# Patient Record
Sex: Male | Born: 1955 | Race: Black or African American | Hispanic: No | Marital: Married | Smoking: Never smoker
Health system: Southern US, Community
[De-identification: ages and names within clinical notes are randomized; demographics above are authoritative.]

## PROBLEM LIST (undated history)

## (undated) DIAGNOSIS — J301 Allergic rhinitis due to pollen: Secondary | ICD-10-CM

## (undated) DIAGNOSIS — M545 Low back pain, unspecified: Secondary | ICD-10-CM

## (undated) DIAGNOSIS — M5431 Sciatica, right side: Secondary | ICD-10-CM

## (undated) DIAGNOSIS — K649 Unspecified hemorrhoids: Secondary | ICD-10-CM

## (undated) DIAGNOSIS — E119 Type 2 diabetes mellitus without complications: Secondary | ICD-10-CM

## (undated) DIAGNOSIS — M48061 Spinal stenosis, lumbar region without neurogenic claudication: Secondary | ICD-10-CM

## (undated) DIAGNOSIS — M722 Plantar fascial fibromatosis: Secondary | ICD-10-CM

## (undated) DIAGNOSIS — M961 Postlaminectomy syndrome, not elsewhere classified: Secondary | ICD-10-CM

## (undated) DIAGNOSIS — E7800 Pure hypercholesterolemia, unspecified: Secondary | ICD-10-CM

## (undated) DIAGNOSIS — G894 Chronic pain syndrome: Secondary | ICD-10-CM

## (undated) DIAGNOSIS — M5136 Other intervertebral disc degeneration, lumbar region: Secondary | ICD-10-CM

## (undated) DIAGNOSIS — M51369 Other intervertebral disc degeneration, lumbar region without mention of lumbar back pain or lower extremity pain: Secondary | ICD-10-CM

## (undated) DIAGNOSIS — F1190 Opioid use, unspecified, uncomplicated: Secondary | ICD-10-CM

## (undated) DIAGNOSIS — K602 Anal fissure, unspecified: Secondary | ICD-10-CM

## (undated) DIAGNOSIS — E559 Vitamin D deficiency, unspecified: Secondary | ICD-10-CM

## (undated) DIAGNOSIS — C61 Malignant neoplasm of prostate: Secondary | ICD-10-CM

## (undated) DIAGNOSIS — I10 Essential (primary) hypertension: Secondary | ICD-10-CM

## (undated) HISTORY — DX: Plantar fascial fibromatosis: M72.2

## (undated) HISTORY — DX: Other intervertebral disc degeneration, lumbar region without mention of lumbar back pain or lower extremity pain: M51.369

## (undated) HISTORY — DX: Allergic rhinitis due to pollen: J30.1

## (undated) HISTORY — DX: Low back pain, unspecified: M54.50

## (undated) HISTORY — PX: PROSTATECTOMY: SHX69

## (undated) HISTORY — DX: Pure hypercholesterolemia, unspecified: E78.00

## (undated) HISTORY — DX: Essential (primary) hypertension: I10

## (undated) HISTORY — DX: Anal fissure, unspecified: K60.2

## (undated) HISTORY — DX: Type 2 diabetes mellitus without complications: E11.9

## (undated) HISTORY — DX: Unspecified hemorrhoids: K64.9

## (undated) HISTORY — DX: Other intervertebral disc degeneration, lumbar region: M51.36

## (undated) HISTORY — DX: Vitamin D deficiency, unspecified: E55.9

## (undated) HISTORY — PX: COLONOSCOPY: SHX174

## (undated) HISTORY — DX: Malignant neoplasm of prostate: C61

## (undated) HISTORY — DX: Low back pain: M54.5

---
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
## 1997-07-30 ENCOUNTER — Emergency Department (HOSPITAL_COMMUNITY): Admission: EM | Admit: 1997-07-30 | Discharge: 1997-07-30 | Payer: Self-pay | Admitting: Internal Medicine

## 1997-08-04 ENCOUNTER — Encounter: Admission: RE | Admit: 1997-08-04 | Discharge: 1997-08-04 | Payer: Self-pay | Admitting: *Deleted

## 1997-08-07 ENCOUNTER — Encounter: Admission: RE | Admit: 1997-08-07 | Discharge: 1997-11-05 | Payer: Self-pay

## 1997-08-08 ENCOUNTER — Encounter: Admission: RE | Admit: 1997-08-08 | Discharge: 1997-08-08 | Payer: Self-pay | Admitting: *Deleted

## 1997-10-28 ENCOUNTER — Emergency Department (HOSPITAL_COMMUNITY): Admission: EM | Admit: 1997-10-28 | Discharge: 1997-10-28 | Payer: Self-pay | Admitting: Emergency Medicine

## 1998-12-16 ENCOUNTER — Encounter: Payer: Self-pay | Admitting: Internal Medicine

## 1998-12-16 ENCOUNTER — Emergency Department (HOSPITAL_COMMUNITY): Admission: EM | Admit: 1998-12-16 | Discharge: 1998-12-16 | Payer: Self-pay | Admitting: Emergency Medicine

## 1999-06-17 ENCOUNTER — Encounter: Payer: Self-pay | Admitting: Emergency Medicine

## 1999-06-17 ENCOUNTER — Emergency Department (HOSPITAL_COMMUNITY): Admission: EM | Admit: 1999-06-17 | Discharge: 1999-06-17 | Payer: Self-pay | Admitting: Emergency Medicine

## 2002-08-07 ENCOUNTER — Encounter: Payer: Self-pay | Admitting: Emergency Medicine

## 2002-08-07 ENCOUNTER — Emergency Department (HOSPITAL_COMMUNITY): Admission: EM | Admit: 2002-08-07 | Discharge: 2002-08-07 | Payer: Self-pay | Admitting: Emergency Medicine

## 2002-09-02 ENCOUNTER — Encounter: Admission: RE | Admit: 2002-09-02 | Discharge: 2002-11-07 | Payer: Self-pay | Admitting: Orthopaedic Surgery

## 2002-10-08 ENCOUNTER — Encounter: Admission: RE | Admit: 2002-10-08 | Discharge: 2002-11-26 | Payer: Self-pay | Admitting: Family Medicine

## 2002-10-22 ENCOUNTER — Emergency Department (HOSPITAL_COMMUNITY): Admission: AD | Admit: 2002-10-22 | Discharge: 2002-10-22 | Payer: Self-pay | Admitting: Family Medicine

## 2002-11-09 ENCOUNTER — Ambulatory Visit (HOSPITAL_COMMUNITY): Admission: RE | Admit: 2002-11-09 | Discharge: 2002-11-09 | Payer: Self-pay | Admitting: Family Medicine

## 2003-07-13 ENCOUNTER — Emergency Department (HOSPITAL_COMMUNITY): Admission: EM | Admit: 2003-07-13 | Discharge: 2003-07-13 | Payer: Self-pay | Admitting: Emergency Medicine

## 2005-02-03 ENCOUNTER — Encounter: Admission: RE | Admit: 2005-02-03 | Discharge: 2005-02-03 | Payer: Self-pay | Admitting: Orthopaedic Surgery

## 2006-09-14 ENCOUNTER — Encounter: Admission: RE | Admit: 2006-09-14 | Discharge: 2006-09-14 | Payer: Self-pay | Admitting: Internal Medicine

## 2007-10-01 ENCOUNTER — Emergency Department (HOSPITAL_COMMUNITY): Admission: EM | Admit: 2007-10-01 | Discharge: 2007-10-01 | Payer: Self-pay | Admitting: Family Medicine

## 2008-01-11 HISTORY — PX: CATARACT EXTRACTION: SUR2

## 2009-01-10 HISTORY — PX: LUMBAR DISC SURGERY: SHX700

## 2009-07-30 ENCOUNTER — Emergency Department (HOSPITAL_COMMUNITY): Admission: EM | Admit: 2009-07-30 | Discharge: 2009-07-30 | Payer: Self-pay | Admitting: Emergency Medicine

## 2009-08-13 ENCOUNTER — Encounter (HOSPITAL_COMMUNITY): Admission: RE | Admit: 2009-08-13 | Discharge: 2009-10-05 | Payer: Self-pay | Admitting: Cardiology

## 2009-12-21 ENCOUNTER — Encounter: Admission: RE | Admit: 2009-12-21 | Payer: Self-pay | Source: Home / Self Care | Admitting: Orthopedic Surgery

## 2009-12-24 ENCOUNTER — Encounter
Admission: RE | Admit: 2009-12-24 | Discharge: 2009-12-24 | Payer: Self-pay | Source: Home / Self Care | Attending: Orthopaedic Surgery | Admitting: Orthopaedic Surgery

## 2010-01-30 ENCOUNTER — Encounter: Payer: Self-pay | Admitting: Orthopaedic Surgery

## 2010-01-31 ENCOUNTER — Encounter: Payer: Self-pay | Admitting: Orthopaedic Surgery

## 2010-01-31 ENCOUNTER — Encounter: Payer: Self-pay | Admitting: Orthopedic Surgery

## 2010-02-01 ENCOUNTER — Encounter: Payer: Self-pay | Admitting: Cardiology

## 2010-03-27 LAB — POCT I-STAT, CHEM 8
BUN: 18 mg/dL (ref 6–23)
Chloride: 104 mEq/L (ref 96–112)
Creatinine, Ser: 1.5 mg/dL (ref 0.4–1.5)
Glucose, Bld: 96 mg/dL (ref 70–99)
Potassium: 3.7 mEq/L (ref 3.5–5.1)

## 2010-03-27 LAB — DIFFERENTIAL
Basophils Absolute: 0 10*3/uL (ref 0.0–0.1)
Lymphocytes Relative: 35 % (ref 12–46)
Lymphs Abs: 1.2 10*3/uL (ref 0.7–4.0)
Monocytes Relative: 11 % (ref 3–12)

## 2010-03-27 LAB — CBC
MCH: 29.1 pg (ref 26.0–34.0)
MCHC: 34.6 g/dL (ref 30.0–36.0)
MCV: 84.3 fL (ref 78.0–100.0)
Platelets: 148 10*3/uL — ABNORMAL LOW (ref 150–400)
RBC: 4.75 MIL/uL (ref 4.22–5.81)
RDW: 15.6 % — ABNORMAL HIGH (ref 11.5–15.5)
WBC: 3.4 10*3/uL — ABNORMAL LOW (ref 4.0–10.5)

## 2010-03-27 LAB — POCT CARDIAC MARKERS: Myoglobin, poc: 75 ng/mL (ref 12–200)

## 2010-04-23 ENCOUNTER — Encounter: Payer: Self-pay | Admitting: Internal Medicine

## 2010-05-28 NOTE — Consult Note (Signed)
[facility]  Patient:    Cameron Cardenas                      MRN: [id number] Proc. Date: 12/16/98 Adm. Date:  [id number] Attending:  Lorre Nick                          Consultation Report  CHIEF COMPLAINT:  Acute low back pain and weakness in the legs.  HISTORY OF PRESENT ILLNESS:  The patient is a 55 year old black male who was doing well until today.  He noted, while standing, that he developed severe pain in his lower back traveling down both legs.  He states he had significant pain to the point of being unable to walk.  No dysesthesias appreciated in his legs. Symptoms continued and he contacted the office and was referred to the emergency room for evaluation.  The patient denies recent strenuous activity.  This is as far back as the past two weeks.  He has not had similar symptoms most recently.  Distant history is notable for him having a herniated disk with laminectomy.  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  He denies problems with voiding.  No postvoid dribbling or urgency as well.  REVIEW OF SYSTEMS:  As noted above.  The patient does not smoke or drink.  He has had some sexual dysfunction in the past.  He has tried Viagra in the past, which caused headaches.  No similar medication used recently.  PHYSICAL EXAMINATION:  GENERAL:  He is a well-developed, well-nourished black male in moderate distress initially.  VITAL SIGNS:  Blood pressure 130/82, pulse 90, respirations 18.  LUNGS:  Clear without wheezes or rales.  He had bilateral paraspinal muscle prominence.  Minimal tenderness in the lower back to percussion.  ABDOMEN:  Soft without significant tenderness.  MUSCULOSKELETAL:  As noted above.  He had bilaterally positive straight leg raise. Strength, however, was intact.  Sensation was intact.  LABORATORY  DATA:  X-ray of the LS spine showed mild disk space narrowing at L5-S1. MRI scan of his back showed mild bulge at L3-4, mild ______ protrusion L4-5 with a paracentral protrusion versus scar at L5-S1.  No evidence for herniated disks appreciated.  IMPRESSION: 1. Acute low back strain. 2. Degenerative disk disease.  PLAN:  The patient was given a dose of Toradol 30 mg IM with a good response. e will continue him on Celebrex 200 mg p.o. b.i.d. with Flexeril 10 mg p.o. q.h.s. for the next two weeks.  The patient is to avoid prolonged sitting, especially hen driving.  He is to take breaks at every hour to hour and a half.  He has been given low back exercises to begin performing once his pain is reduced.  He will be seen in the office in two weeks time for follow-up. DD:  12/16/98 TD:  12/17/98 Job: [id number]

## 2010-12-29 ENCOUNTER — Other Ambulatory Visit: Payer: Self-pay | Admitting: Internal Medicine

## 2010-12-29 DIAGNOSIS — R109 Unspecified abdominal pain: Secondary | ICD-10-CM

## 2010-12-30 ENCOUNTER — Ambulatory Visit
Admission: RE | Admit: 2010-12-30 | Discharge: 2010-12-30 | Disposition: A | Discharge status: Home / Self Care | Payer: BC Managed Care – PPO | Source: Ambulatory Visit | Attending: Internal Medicine | Admitting: Internal Medicine

## 2010-12-30 DIAGNOSIS — R109 Unspecified abdominal pain: Secondary | ICD-10-CM

## 2011-01-07 ENCOUNTER — Other Ambulatory Visit: Payer: Self-pay | Admitting: Cardiology

## 2011-01-07 ENCOUNTER — Ambulatory Visit
Admission: RE | Admit: 2011-01-07 | Discharge: 2011-01-07 | Disposition: A | Discharge status: Home / Self Care | Payer: BC Managed Care – PPO | Source: Ambulatory Visit | Attending: Cardiology | Admitting: Cardiology

## 2011-01-07 DIAGNOSIS — R0781 Pleurodynia: Secondary | ICD-10-CM

## 2011-01-07 DIAGNOSIS — R079 Chest pain, unspecified: Secondary | ICD-10-CM

## 2011-12-28 ENCOUNTER — Encounter: Payer: Self-pay | Admitting: Internal Medicine

## 2012-01-03 ENCOUNTER — Ambulatory Visit (AMBULATORY_SURGERY_CENTER): Payer: BC Managed Care – PPO | Admitting: *Deleted

## 2012-01-03 ENCOUNTER — Encounter: Payer: Self-pay | Admitting: Internal Medicine

## 2012-01-03 VITALS — Ht 76.0 in | Wt 215.8 lb

## 2012-01-03 DIAGNOSIS — Z1211 Encounter for screening for malignant neoplasm of colon: Secondary | ICD-10-CM

## 2012-01-03 MED ORDER — NA SULFATE-K SULFATE-MG SULF 17.5-3.13-1.6 GM/177ML PO SOLN: 1.0000 | Freq: Once | ORAL | Status: DC

## 2012-01-13 ENCOUNTER — Ambulatory Visit (AMBULATORY_SURGERY_CENTER): Payer: BC Managed Care – PPO | Admitting: Internal Medicine

## 2012-01-13 ENCOUNTER — Encounter: Payer: Self-pay | Admitting: Internal Medicine

## 2012-01-13 VITALS — BP 115/76 | HR 62 | Temp 98.3°F | Resp 13 | Ht 76.0 in | Wt 215.0 lb

## 2012-01-13 DIAGNOSIS — Z1211 Encounter for screening for malignant neoplasm of colon: Secondary | ICD-10-CM

## 2012-01-13 DIAGNOSIS — Z86010 Personal history of colonic polyps: Secondary | ICD-10-CM

## 2012-01-13 MED ORDER — SODIUM CHLORIDE 0.9 % IV SOLN: 500.0000 mL | INTRAVENOUS | Status: DC

## 2012-01-13 NOTE — Op Note (Signed)
[facility] [address] [place], [address]   COLONOSCOPY PROCEDURE REPORT  PATIENT: Cameron Cardenas, Cameron Cardenas  MR#: [id number] BIRTHDATE: 29-Apr-1955 , 56  yrs. old GENDER: Male ENDOSCOPIST: Iva Boop, MD, [facility] REFERRED XB:JYNWGN Donette Larry, M.D. PROCEDURE DATE:  01/13/2012 PROCEDURE:   Colonoscopy, diagnostic ASA CLASS:   Class III INDICATIONS:Screening and surveillance,personal history of colonic polyps. MEDICATIONS: propofol (Diprivan) 250mg  IV, MAC sedation, administered by CRNA, and These medications were titrated to patient response per physician's verbal order  DESCRIPTION OF PROCEDURE:   After the risks benefits and alternatives of the procedure were thoroughly explained, informed consent was obtained.  A digital rectal exam revealed no abnormalities of the rectum and A digital rectal exam revealed the prostate was not enlarged.   The LB CF-[id number]  endoscope was introduced through the anus and advanced to the cecum, which was identified by both the appendix and ileocecal valve. No adverse events experienced.   The quality of the prep was Suprep excellent The instrument was then slowly withdrawn as the colon was fully examined.      COLON FINDINGS: A normal appearing cecum, ileocecal valve, and appendiceal orifice were identified.  The ascending, hepatic flexure, transverse, splenic flexure, descending, sigmoid colon and rectum appeared unremarkable.  No polyps or cancers were seen. Retroflexed views revealed no abnormalities. The time to cecum=1 minutes 25 seconds.  Withdrawal time=14 minutes 49 seconds.  The scope was withdrawn and the procedure completed. COMPLICATIONS: There were no complications.  ENDOSCOPIC IMPRESSION: Normal colonoscopy in this patient with reported history of prior polyps  RECOMMENDATIONS: Repeat Colonoscopy in 5 years. if no polyps revert to routine every 10 year colonoscopy   eSigned:  Iva Boop, MD, [facility]  01/13/2012 4:05 PM   cc: Georgann Housekeeper MD and The Patient

## 2012-01-13 NOTE — Patient Instructions (Addendum)
No polyps were seen. Your colonoscopy was normal.  Since you report a history of polyps on your last colonoscopy I recommend a routine repeat colonoscopy in 5 years.  Thank you for choosing me and [facility].  Iva Boop, MD, FACG  YOU HAD AN ENDOSCOPIC PROCEDURE TODAY AT THE [facility]: Refer to the procedure report that was given to you for any specific questions about what was found during the examination.  If the procedure report does not answer your questions, please call your gastroenterologist to clarify.  If you requested that your care partner not be given the details of your procedure findings, then the procedure report has been included in a sealed envelope for you to review at your convenience later.  YOU SHOULD EXPECT: Some feelings of bloating in the abdomen. Passage of more gas than usual.  Walking can help get rid of the air that was put into your GI tract during the procedure and reduce the bloating. If you had a lower endoscopy (such as a colonoscopy or flexible sigmoidoscopy) you may notice spotting of blood in your stool or on the toilet paper. If you underwent a bowel prep for your procedure, then you may not have a normal bowel movement for a few days.  DIET: Your first meal following the procedure should be a light meal and then it is ok to progress to your normal diet.  A half-sandwich or bowl of soup is an example of a good first meal.  Heavy or fried foods are harder to digest and may make you feel nauseous or bloated.  Likewise meals heavy in dairy and vegetables can cause extra gas to form and this can also increase the bloating.  Drink plenty of fluids but you should avoid alcoholic beverages for 24 hours.  ACTIVITY: Your care partner should take you home directly after the procedure.  You should plan to take it easy, moving slowly for the rest of the day.  You can resume normal activity the day after the procedure however you should NOT  DRIVE or use heavy machinery for 24 hours (because of the sedation medicines used during the test).    SYMPTOMS TO REPORT IMMEDIATELY: A gastroenterologist can be reached at any hour.  During normal business hours, 8:30 AM to 5:00 PM Monday through Friday, call [phone].  After hours and on weekends, please call the GI answering service at [phone] who will take a message and have the physician on call contact you.   Following lower endoscopy (colonoscopy or flexible sigmoidoscopy):  Excessive amounts of blood in the stool  Significant tenderness or worsening of abdominal pains  Swelling of the abdomen that is new, acute  Fever of 100F or higher   FOLLOW UP: If any biopsies were taken you will be contacted by phone or by letter within the next 1-3 weeks.  Call your gastroenterologist if you have not heard about the biopsies in 3 weeks.  Our staff will call the home number listed on your records the next business day following your procedure to check on you and address any questions or concerns that you may have at that time regarding the information given to you following your procedure. This is a courtesy call and so if there is no answer at the home number and we have not heard from you through the emergency physician on call, we will assume that you have returned to your regular daily activities without incident.  SIGNATURES/CONFIDENTIALITY: You and/or  your care partner have signed paperwork which will be entered into your electronic medical record.  These signatures attest to the fact that that the information above on your After Visit Summary has been reviewed and is understood.  Full responsibility of the confidentiality of this discharge information lies with you and/or your care-partner.    Normal colonoscopy.  Next colonoscopy in 5 years-2019.

## 2012-01-13 NOTE — Progress Notes (Signed)
Patient did not experience any of the following events: a burn prior to discharge; a fall within the facility; wrong site/side/patient/procedure/implant event; or a hospital transfer or hospital admission upon discharge from the facility. (G8907) Patient did not have preoperative order for IV antibiotic SSI prophylaxis. (G8918)  

## 2012-01-16 ENCOUNTER — Telehealth: Payer: Self-pay | Admitting: *Deleted

## 2012-01-16 NOTE — Telephone Encounter (Signed)
  Follow up Call-  Call back number 01/13/2012  Post procedure Call Back phone  # [phone] cell  Permission to leave phone message Yes     Left message on answering machine to give [name] a call back if he is experiencing problems or has questions

## 2012-08-28 ENCOUNTER — Encounter: Payer: Self-pay | Admitting: Endocrinology

## 2012-08-28 ENCOUNTER — Ambulatory Visit (INDEPENDENT_AMBULATORY_CARE_PROVIDER_SITE_OTHER): Payer: Managed Care, Other (non HMO) | Admitting: Endocrinology

## 2012-08-28 VITALS — BP 124/80 | HR 70 | Temp 98.2°F | Resp 12 | Ht 76.0 in | Wt 212.7 lb

## 2012-08-28 DIAGNOSIS — E785 Hyperlipidemia, unspecified: Secondary | ICD-10-CM

## 2012-08-28 DIAGNOSIS — E119 Type 2 diabetes mellitus without complications: Secondary | ICD-10-CM

## 2012-08-28 DIAGNOSIS — E118 Type 2 diabetes mellitus with unspecified complications: Secondary | ICD-10-CM | POA: Insufficient documentation

## 2012-08-28 LAB — COMPREHENSIVE METABOLIC PANEL
Alkaline Phosphatase: 40 U/L (ref 39–117)
CO2: 26 mEq/L (ref 19–32)
Creatinine, Ser: 1.1 mg/dL (ref 0.4–1.5)
GFR: 85.69 mL/min (ref >60.00)
Glucose, Bld: 100 mg/dL — ABNORMAL HIGH (ref 70–99)
Sodium: 138 mEq/L (ref 135–145)
Total Bilirubin: 1.1 mg/dL (ref 0.3–1.2)
Total Protein: 6.5 g/dL (ref 6.0–8.3)

## 2012-08-28 LAB — HEMOGLOBIN A1C: Hgb A1c MFr Bld: 5.9 % (ref 4.6–6.5)

## 2012-08-28 LAB — MICROALBUMIN / CREATININE URINE RATIO
Microalb Creat Ratio: 0.3 mg/g (ref 0.0–30.0)
Microalb, Ur: 0.5 mg/dL (ref 0.0–1.9)

## 2012-08-28 NOTE — Patient Instructions (Signed)
Please check blood sugars at least half the time about 2 hours after any meal and weekly on waking up. Please bring blood sugar monitor to each visit  Try 1/2 Liptor 1st

## 2012-08-28 NOTE — Progress Notes (Signed)
Patient ID: Cameron Cardenas, male   DOB: 12/13/1955, 57 y.o.   MRN: [id number]  Cameron Cardenas is an 57 y.o. male.   Reason for Appointment: Diabetes follow-up   History of Present Illness   Diagnosis: Type 2 DIABETES MELITUS, date of diagnosis: 2004  PAST history: He had mild diabetes at onset and was treated with metformin and subsequently Actoplusmet In 2013 he had changed his diet significantly and was exercising. This helped him with weight loss Subsequently his blood sugars have been excellent with A1c upper normal  RECENT history: His blood sugars are still looking fairly good although he thinks he gained some weight with being on vacation for 2 weeks   He likes to take his Actoplusmet in the morning as thinks he feels better with this     Oral hypoglycemic drugs: Actoplusmet and WelChol        Side effects from medications: None Monitors blood glucose:  none, this is despite his being instructed on home glucose monitoring in 12/13       Meals: 3 meals per day.          Physical activity: exercise: At least 3 days a week with exercise bike, some running and treadmill           Dietician visit: Most recent: 12/13           Complications: are: None, has normal microalbumin   Lab Results  Component Value Date   HGBA1C 5.9 08/28/2012       Wt Readings from Last 3 Encounters:  08/28/12 212 lb 11.2 oz (96.48 kg)  01/13/12 215 lb (97.523 kg)  01/03/12 215 lb 12.8 oz (97.886 kg)      Medication List       This list is accurate as of: 08/28/12  9:59 AM.  Always use your most recent med list.               aspirin 81 MG tablet  Take 81 mg by mouth daily.     atorvastatin 10 MG tablet  Commonly known as:  LIPITOR  Take 10 mg by mouth daily.     FISH OIL PO  Take by mouth daily.     pioglitazone-metformin 15-500 MG per tablet  Commonly known as:  ACTOPLUS MET  Take 1 tablet by mouth 2 (two) times daily.     PROSTATE PO  Take by mouth.     TRIAMTERENE-HCTZ PO   Take by mouth daily. 37.5/25 mg     ZYRTEC PO  Take 1 tablet by mouth as needed.        Allergies: No Known Allergies  Past Medical History  Diagnosis Date  . Lumbago   . Essential hypertension, malignant   . Type II or unspecified type diabetes mellitus without mention of complication, not stated as uncontrolled   . Pure hypercholesterolemia   . Allergic rhinitis due to pollen   . Unspecified vitamin D deficiency     Past Surgical History  Procedure Laterality Date  . Lumbar disc surgery  2011  . Colonoscopy      in [facility], MD no longer in practice, does not recall the name of facility. Does belive polyps were removed    Family History  Problem Relation Age of Onset  . Colon cancer Neg Hx   . Esophageal cancer Neg Hx   . Rectal cancer Neg Hx   . Stomach cancer Neg Hx     Social History:  reports that  he quit smoking about 20 years ago. He has never used smokeless tobacco. He reports that  drinks alcohol. He reports that he does not use illicit drugs.   Office Visit on 08/28/2012  Component Date Value Range Status  . Hemoglobin A1C 08/28/2012 5.9  4.6 - 6.5 % Final   Glycemic Control Guidelines for People with Diabetes:Non Diabetic:  <6%Goal of Therapy: <7%Additional Action Suggested:  >8%   . Microalb, Ur 08/28/2012 0.5  0.0 - 1.9 mg/dL Final  . Creatinine,U 40/98/1191 193.8   Final  . Microalb Creat Ratio 08/28/2012 0.3  0.0 - 30.0 mg/g Final  . Sodium 08/28/2012 138  135 - 145 mEq/L Final  . Potassium 08/28/2012 3.7  3.5 - 5.1 mEq/L Final  . Chloride 08/28/2012 107  96 - 112 mEq/L Final  . CO2 08/28/2012 26  19 - 32 mEq/L Final  . Glucose, Bld 08/28/2012 100* 70 - 99 mg/dL Final  . BUN 47/82/9562 17  6 - 23 mg/dL Final  . Creatinine, Ser 08/28/2012 1.1  0.4 - 1.5 mg/dL Final  . Total Bilirubin 08/28/2012 1.1  0.3 - 1.2 mg/dL Final  . Alkaline Phosphatase 08/28/2012 40  39 - 117 U/L Final  . AST 08/28/2012 24  0 - 37 U/L Final  . ALT 08/28/2012 27  0 - 53 U/L  Final  . Total Protein 08/28/2012 6.5  6.0 - 8.3 g/dL Final  . Albumin 13/08/6576 3.8  3.5 - 5.2 g/dL Final  . Calcium 46/96/2952 8.7  8.4 - 10.5 mg/dL Final  . GFR 84/13/2440 85.69  >60.00 mL/min Final    Review of Systems:  HYPERTENSION:  mild and well controlled with Maxzide only, the dose was reduced to half tablet in April  HYPERLIPIDEMIA: The lipid abnormality consists of elevated LDL which was 94 on the last visit, before adding WelChol was 157. Has been on 40 mg atorvastatin since 12/13 and also taking WelChol since 10/13      Examination:   BP 124/80  Pulse 70  Temp(Src) 98.2 F (36.8 C)  Resp 12  Ht 6\' 4"  (1.93 m)  Wt 212 lb 11.2 oz (96.48 kg)  BMI 25.9 kg/m2  SpO2 97%  Body mass index is 25.9 kg/(m^2).   ASSESSMENT/ PLAN::   Diabetes type 2   The. patient's diabetes control appears to be  excellent with upper normal A1c and lab glucose of 100.  Currently doing well with low-dose Actoplusmet and WelChol, will continue the same regimen and encouraged regular exercise also  HYPERLIPIDEMIA with increased LDL particle number: To have lipids and NMR panel checked on the next visit  Cameron Cardenas 08/28/2012, 9:59 AM

## 2012-09-19 ENCOUNTER — Other Ambulatory Visit: Payer: Self-pay | Admitting: *Deleted

## 2012-09-19 MED ORDER — PIOGLITAZONE HCL-METFORMIN HCL 15-500 MG PO TABS: 1.0000 | ORAL_TABLET | Freq: Two times a day (BID) | ORAL | Status: DC

## 2012-09-24 ENCOUNTER — Other Ambulatory Visit: Payer: Self-pay | Admitting: *Deleted

## 2012-09-24 MED ORDER — PIOGLITAZONE HCL-METFORMIN HCL 15-500 MG PO TABS: 1.0000 | ORAL_TABLET | Freq: Two times a day (BID) | ORAL | Status: DC

## 2012-09-25 ENCOUNTER — Other Ambulatory Visit: Payer: Self-pay | Admitting: *Deleted

## 2012-09-25 ENCOUNTER — Telehealth: Payer: Self-pay | Admitting: Endocrinology

## 2012-09-25 MED ORDER — PIOGLITAZONE HCL-METFORMIN HCL 15-500 MG PO TABS: 1.0000 | ORAL_TABLET | Freq: Two times a day (BID) | ORAL | Status: DC

## 2012-09-25 NOTE — Telephone Encounter (Signed)
rx sent, pt aware 

## 2012-10-03 ENCOUNTER — Other Ambulatory Visit: Payer: Self-pay | Admitting: *Deleted

## 2012-10-03 MED ORDER — TRIAMTERENE-HCTZ 37.5-25 MG PO TABS: 1.0000 | ORAL_TABLET | Freq: Every day | ORAL | Status: DC

## 2012-10-04 ENCOUNTER — Telehealth: Payer: Self-pay | Admitting: *Deleted

## 2012-10-04 NOTE — Telephone Encounter (Signed)
rx sent

## 2012-11-28 ENCOUNTER — Encounter: Payer: Self-pay | Admitting: *Deleted

## 2012-11-28 ENCOUNTER — Ambulatory Visit: Payer: BC Managed Care – PPO | Admitting: Endocrinology

## 2012-11-28 DIAGNOSIS — Z0289 Encounter for other administrative examinations: Secondary | ICD-10-CM

## 2012-12-28 ENCOUNTER — Ambulatory Visit: Payer: BC Managed Care – PPO | Admitting: Endocrinology

## 2012-12-28 ENCOUNTER — Encounter: Payer: Self-pay | Admitting: *Deleted

## 2012-12-28 DIAGNOSIS — Z0289 Encounter for other administrative examinations: Secondary | ICD-10-CM

## 2013-01-14 ENCOUNTER — Ambulatory Visit: Payer: Self-pay | Admitting: Podiatry

## 2013-01-23 ENCOUNTER — Other Ambulatory Visit (INDEPENDENT_AMBULATORY_CARE_PROVIDER_SITE_OTHER): Payer: Managed Care, Other (non HMO)

## 2013-01-23 DIAGNOSIS — E119 Type 2 diabetes mellitus without complications: Secondary | ICD-10-CM

## 2013-01-23 DIAGNOSIS — E785 Hyperlipidemia, unspecified: Secondary | ICD-10-CM

## 2013-01-23 LAB — COMPREHENSIVE METABOLIC PANEL
ALBUMIN: 4.3 g/dL (ref 3.5–5.2)
ALK PHOS: 44 U/L (ref 39–117)
ALT: 22 U/L (ref 0–53)
AST: 18 U/L (ref 0–37)
BUN: 19 mg/dL (ref 6–23)
CO2: 32 mEq/L (ref 19–32)
CREATININE: 1.4 mg/dL (ref 0.4–1.5)
Calcium: 9.3 mg/dL (ref 8.4–10.5)
Chloride: 104 mEq/L (ref 96–112)
GFR: 65.74 mL/min (ref >60.00)
GLUCOSE: 109 mg/dL — AB (ref 70–99)
Potassium: 4.4 mEq/L (ref 3.5–5.1)
Sodium: 140 mEq/L (ref 135–145)
Total Bilirubin: 1.1 mg/dL (ref 0.3–1.2)
Total Protein: 7 g/dL (ref 6.0–8.3)

## 2013-01-23 LAB — LIPID PANEL
CHOLESTEROL: 183 mg/dL (ref 0–200)
HDL: 73.1 mg/dL (ref >39.00)
LDL Cholesterol: 97 mg/dL (ref 0–99)
TRIGLYCERIDES: 67 mg/dL (ref 0.0–149.0)
Total CHOL/HDL Ratio: 3
VLDL: 13.4 mg/dL (ref 0.0–40.0)

## 2013-01-23 LAB — HEMOGLOBIN A1C: HEMOGLOBIN A1C: 6 % (ref 4.6–6.5)

## 2013-01-24 ENCOUNTER — Ambulatory Visit: Payer: Self-pay | Admitting: Podiatry

## 2013-01-24 ENCOUNTER — Ambulatory Visit (INDEPENDENT_AMBULATORY_CARE_PROVIDER_SITE_OTHER): Payer: Managed Care, Other (non HMO) | Admitting: Podiatry

## 2013-01-24 ENCOUNTER — Encounter: Payer: Self-pay | Admitting: Podiatry

## 2013-01-24 VITALS — BP 117/66 | HR 73 | Resp 16

## 2013-01-24 DIAGNOSIS — M79609 Pain in unspecified limb: Secondary | ICD-10-CM

## 2013-01-24 DIAGNOSIS — B351 Tinea unguium: Secondary | ICD-10-CM

## 2013-01-24 NOTE — Progress Notes (Signed)
Patient ID: Cameron Cardenas, male   DOB: 08-28-1955, 58 y.o.   MRN: [id number]  Subjective: This 58 year old black male known diabetic presents today requesting debridement of painful toenails and a plantar callus. He was last seen by Dr. Paulla Dolly on 08/08/2012 for a similar problem.  Objective: Orientated x3 Dermatological: Hypertrophic, elongated, discolored toenails x10. Plantar keratoses noted.  Assessment: Symptomatic onychomycoses x10 Porokeratoses x1  Plan: Nails x10 debrided back and keratoses x1 debrided back without any bleeding.  Please note that during the encounter today the patient was talking on his cell phone (a sign attached to the door states no use of cell phone during the visit). I politely asked the patient not to use his cell phone during the visit. He continued to use his cell phone and I said nothing further to him. At the conclusion of the visit patient then said in an angry manner that the sign on the door needed to be changed. He further went on to say that I had no right to tell him not to speak on the cell phone. He said it was an emergency call concerning one of his children and that was the reason that he was speaking on the cell phone.  I reapplied that I was not trying to deny him any of his rights, however, he did not say that he had emergency phone call. I merely said that it was common for people to continue to speak on the cell phone for nonurgent manners.His manner and demeanor during our brief conversation demonstrated anger and hostility.  I asked him if he wanted to schedule a followup visit and he replied he was not sure that he wanted to come back to this office. He then left the treatment room and complained about his experience today to our office manager.

## 2013-01-24 NOTE — Patient Instructions (Signed)
Diabetes and Foot Care Diabetes may cause you to have problems because of poor blood supply (circulation) to your feet and legs. This may cause the skin on your feet to become thinner, break easier, and heal more slowly. Your skin may become dry, and the skin may peel and crack. You may also have nerve damage in your legs and feet causing decreased feeling in them. You may not notice minor injuries to your feet that could lead to infections or more serious problems. Taking care of your feet is one of the most important things you can do for yourself.  HOME CARE INSTRUCTIONS  Wear shoes at all times, even in the house. Do not go barefoot. Bare feet are easily injured.  Check your feet daily for blisters, cuts, and redness. If you cannot see the bottom of your feet, use a mirror or ask someone for help.  Wash your feet with warm water (do not use hot water) and mild soap. Then pat your feet and the areas between your toes until they are completely dry. Do not soak your feet as this can dry your skin.  Apply a moisturizing lotion or petroleum jelly (that does not contain alcohol and is unscented) to the skin on your feet and to dry, brittle toenails. Do not apply lotion between your toes.  Trim your toenails straight across. Do not dig under them or around the cuticle. File the edges of your nails with an emery board or nail file.  Do not cut corns or calluses or try to remove them with medicine.  Wear clean socks or stockings every day. Make sure they are not too tight. Do not wear knee-high stockings since they may decrease blood flow to your legs.  Wear shoes that fit properly and have enough cushioning. To break in new shoes, wear them for just a few hours a day. This prevents you from injuring your feet. Always look in your shoes before you put them on to be sure there are no objects inside.  Do not cross your legs. This may decrease the blood flow to your feet.  If you find a minor scrape,  cut, or break in the skin on your feet, keep it and the skin around it clean and dry. These areas may be cleansed with mild soap and water. Do not cleanse the area with peroxide, alcohol, or iodine.  When you remove an adhesive bandage, be sure not to damage the skin around it.  If you have a wound, look at it several times a day to make sure it is healing.  Do not use heating pads or hot water bottles. They may burn your skin. If you have lost feeling in your feet or legs, you may not know it is happening until it is too late.  Make sure your health care provider performs a complete foot exam at least annually or more often if you have foot problems. Report any cuts, sores, or bruises to your health care provider immediately. SEEK MEDICAL CARE IF:   You have an injury that is not healing.  You have cuts or breaks in the skin.  You have an ingrown nail.  You notice redness on your legs or feet.  You feel burning or tingling in your legs or feet.  You have pain or cramps in your legs and feet.  Your legs or feet are numb.  Your feet always feel cold. SEEK IMMEDIATE MEDICAL CARE IF:   There is increasing redness,   swelling, or pain in or around a wound.  There is a red line that goes up your leg.  Pus is coming from a wound.  You develop a fever or as directed by your health care provider.  You notice a bad smell coming from an ulcer or wound. Document Released: 12/25/1999 Document Revised: 08/29/2012 Document Reviewed: 06/05/2012 Exit[employer] Patient Information 2014 [employer].  

## 2013-01-28 ENCOUNTER — Encounter: Payer: Self-pay | Admitting: *Deleted

## 2013-01-28 ENCOUNTER — Ambulatory Visit: Payer: Managed Care, Other (non HMO) | Admitting: Endocrinology

## 2013-01-28 LAB — LIPOPROTEIN ANALYSIS BY NMR
HDL Particle Number: 35 umol/L (ref >=30.5)
LDL Particle Number: 1348 nmol/L — ABNORMAL HIGH (ref <1000)
LDL SIZE: 21.3 nm (ref >20.5)
SMALL LDL PARTICLE NUMBER: 465 nmol/L (ref <=527)

## 2013-02-13 ENCOUNTER — Ambulatory Visit: Payer: Managed Care, Other (non HMO) | Admitting: Endocrinology

## 2013-05-09 ENCOUNTER — Telehealth: Payer: Self-pay | Admitting: *Deleted

## 2013-05-09 NOTE — Telephone Encounter (Signed)
Pharmacy sent fax requesting refill of Maxzide, rx was denied, patient has no showed his appts and needs OV before refills given

## 2013-05-13 ENCOUNTER — Telehealth: Payer: Self-pay | Admitting: Endocrinology

## 2013-05-13 ENCOUNTER — Other Ambulatory Visit: Payer: Self-pay | Admitting: *Deleted

## 2013-05-13 MED ORDER — TRIAMTERENE-HCTZ 37.5-25 MG PO TABS: 1.0000 | ORAL_TABLET | Freq: Every day | ORAL | Status: DC

## 2013-05-13 NOTE — Telephone Encounter (Signed)
Pt's info will not be in effect until the end of next month and has an appt 6/25 and 6/28 he does not have the ability to pay self pay for a visit sooner. What can we do to help him in the meantime regarding his blood pressure pills

## 2013-05-13 NOTE — Telephone Encounter (Signed)
Called pt's wife and advised we sent in a month supply for his blood pressure med. Understood.

## 2013-07-04 ENCOUNTER — Other Ambulatory Visit: Payer: Managed Care, Other (non HMO)

## 2013-07-08 ENCOUNTER — Ambulatory Visit: Payer: Managed Care, Other (non HMO) | Admitting: Endocrinology

## 2013-07-15 ENCOUNTER — Other Ambulatory Visit (INDEPENDENT_AMBULATORY_CARE_PROVIDER_SITE_OTHER): Payer: BC Managed Care – PPO

## 2013-07-15 ENCOUNTER — Other Ambulatory Visit: Payer: Self-pay | Admitting: *Deleted

## 2013-07-15 DIAGNOSIS — E119 Type 2 diabetes mellitus without complications: Secondary | ICD-10-CM

## 2013-07-15 LAB — LIPID PANEL
Cholesterol: 193 mg/dL (ref 0–200)
HDL: 91.8 mg/dL (ref >39.00)
LDL CALC: 89 mg/dL (ref 0–99)
NonHDL: 101.2
Total CHOL/HDL Ratio: 2
Triglycerides: 61 mg/dL (ref 0.0–149.0)
VLDL: 12.2 mg/dL (ref 0.0–40.0)

## 2013-07-15 LAB — COMPREHENSIVE METABOLIC PANEL
ALT: 38 U/L (ref 0–53)
AST: 31 U/L (ref 0–37)
Albumin: 3.8 g/dL (ref 3.5–5.2)
Alkaline Phosphatase: 43 U/L (ref 39–117)
BILIRUBIN TOTAL: 1.1 mg/dL (ref 0.2–1.2)
BUN: 20 mg/dL (ref 6–23)
CO2: 29 mEq/L (ref 19–32)
Calcium: 8.9 mg/dL (ref 8.4–10.5)
Chloride: 106 mEq/L (ref 96–112)
Creatinine, Ser: 1.3 mg/dL (ref 0.4–1.5)
GFR: 72.67 mL/min (ref >60.00)
Glucose, Bld: 111 mg/dL — ABNORMAL HIGH (ref 70–99)
Potassium: 4 mEq/L (ref 3.5–5.1)
SODIUM: 140 meq/L (ref 135–145)
Total Protein: 6.3 g/dL (ref 6.0–8.3)

## 2013-07-15 LAB — HEMOGLOBIN A1C: HEMOGLOBIN A1C: 5.7 % (ref 4.6–6.5)

## 2013-07-18 ENCOUNTER — Encounter: Payer: Self-pay | Admitting: Endocrinology

## 2013-07-18 ENCOUNTER — Ambulatory Visit (INDEPENDENT_AMBULATORY_CARE_PROVIDER_SITE_OTHER): Payer: BC Managed Care – PPO | Admitting: Endocrinology

## 2013-07-18 VITALS — BP 136/88 | HR 72 | Temp 97.8°F | Resp 16 | Ht 76.0 in | Wt 194.4 lb

## 2013-07-18 DIAGNOSIS — E119 Type 2 diabetes mellitus without complications: Secondary | ICD-10-CM

## 2013-07-18 DIAGNOSIS — E785 Hyperlipidemia, unspecified: Secondary | ICD-10-CM

## 2013-07-18 DIAGNOSIS — I10 Essential (primary) hypertension: Secondary | ICD-10-CM

## 2013-07-18 MED ORDER — LISINOPRIL-HYDROCHLOROTHIAZIDE 10-12.5 MG PO TABS: 1.0000 | ORAL_TABLET | Freq: Every day | ORAL | Status: DC

## 2013-07-18 NOTE — Addendum Note (Signed)
Addended by: Elayne Snare on: 07/18/2013 05:12 PM   Modules accepted: Orders

## 2013-07-18 NOTE — Progress Notes (Addendum)
Patient ID: Cameron Cardenas, male   DOB: September 27, 1955, 58 y.o.   MRN: [id number]   Reason for Appointment: Diabetes follow-up   History of Present Illness   Diagnosis: Type 2 DIABETES MELITUS, date of diagnosis: 2004  PAST history: He had mild diabetes at onset and was treated with metformin and subsequently Actoplusmet In 2013 he had changed his diet significantly and was exercising. This helped him with weight loss Subsequently his blood sugars have been excellent with A1c upper normal  RECENT history:  He has not been seen in followup for nearly a year His blood sugars are still very well controlled with normal A1c although he has not been doing home glucose monitoring He has lost a significant amount of weight with  changing his diet with elimination of high fat and high carbohydrate meals and more Also has been very consistent with his exercise He was previously on WelChol but has not been taking this; currently only on Actoplusmet twice a day     Oral hypoglycemic drugs: Actoplusmet         Side effects from medications: None Monitors blood glucose:  none, he was instructed on home glucose monitoring in 12/13       Meals: 3 meals per day.          Physical activity: exercise: At least 3 days a week with exercise bike, some running and treadmill           Dietician visit: Most recent: 93/26           Complications: are: None, has normal microalbumin    Wt Readings from Last 3 Encounters:  07/18/13 194 lb 6.4 oz (88.179 kg)  08/28/12 212 lb 11.2 oz (96.48 kg)  01/13/12 215 lb (97.523 kg)   Lab Results  Component Value Date   HGBA1C 5.7 07/15/2013   HGBA1C 6.0 01/23/2013   HGBA1C 5.9 08/28/2012   Lab Results  Component Value Date   MICROALBUR 0.5 08/28/2012   LDLCALC 89 07/15/2013   CREATININE 1.3 07/15/2013      Medication List       This list is accurate as of: 07/18/13  9:02 AM.  Always use your most recent med list.               aspirin 81 MG tablet  Take 81 mg by  mouth daily.     atorvastatin 10 MG tablet  Commonly known as:  LIPITOR  Take 40 mg by mouth daily.     pioglitazone-metformin 15-500 MG per tablet  Commonly known as:  ACTOPLUS MET  Take 1 tablet by mouth 2 (two) times daily.     PROSTATE PO  Take by mouth.     triamterene-hydrochlorothiazide 37.5-25 MG per tablet  Commonly known as:  MAXZIDE-25  Take 1 tablet by mouth daily. Patient wants 90 day supply     WELCHOL 3.75 G Pack  Generic drug:  Colesevelam HCl  Take 3.75 g by mouth daily.     ZYRTEC PO  Take 1 tablet by mouth as needed.        Allergies: No Known Allergies  Past Medical History  Diagnosis Date  . Lumbago   . Essential hypertension, malignant   . Type II or unspecified type diabetes mellitus without mention of complication, not stated as uncontrolled   . Pure hypercholesterolemia   . Allergic rhinitis due to pollen   . Unspecified vitamin D deficiency     Past Surgical History  Procedure Laterality  Date  . Lumbar disc surgery  2011  . Colonoscopy      in [facility], MD no longer in practice, does not recall the name of facility. Does belive polyps were removed    Family History  Problem Relation Age of Onset  . Colon cancer Neg Hx   . Esophageal cancer Neg Hx   . Rectal cancer Neg Hx   . Stomach cancer Neg Hx     Social History:  reports that he quit smoking about 20 years ago. He has never used smokeless tobacco. He reports that he drinks alcohol. He reports that he does not use illicit drugs.   Appointment on 07/15/2013  Component Date Value Ref Range Status  . Hemoglobin A1C 07/15/2013 5.7  4.6 - 6.5 % Final   Glycemic Control Guidelines for People with Diabetes:Non Diabetic:  <6%Goal of Therapy: <7%Additional Action Suggested:  >8%   . Sodium 07/15/2013 140  135 - 145 mEq/L Final  . Potassium 07/15/2013 4.0  3.5 - 5.1 mEq/L Final  . Chloride 07/15/2013 106  96 - 112 mEq/L Final  . CO2 07/15/2013 29  19 - 32 mEq/L Final  . Glucose, Bld  07/15/2013 111* 70 - 99 mg/dL Final  . BUN 07/15/2013 20  6 - 23 mg/dL Final  . Creatinine, Ser 07/15/2013 1.3  0.4 - 1.5 mg/dL Final  . Total Bilirubin 07/15/2013 1.1  0.2 - 1.2 mg/dL Final  . Alkaline Phosphatase 07/15/2013 43  39 - 117 U/L Final  . AST 07/15/2013 31  0 - 37 U/L Final  . ALT 07/15/2013 38  0 - 53 U/L Final  . Total Protein 07/15/2013 6.3  6.0 - 8.3 g/dL Final  . Albumin 07/15/2013 3.8  3.5 - 5.2 g/dL Final  . Calcium 07/15/2013 8.9  8.4 - 10.5 mg/dL Final  . GFR 07/15/2013 72.67  >60.00 mL/min Final  . Cholesterol 07/15/2013 193  0 - 200 mg/dL Final   ATP III Classification       Desirable:  < 200 mg/dL               Borderline High:  200 - 239 mg/dL          High:  > = 240 mg/dL  . Triglycerides 07/15/2013 61.0  0.0 - 149.0 mg/dL Final   Normal:  <150 mg/dLBorderline High:  150 - 199 mg/dL  . HDL 07/15/2013 91.80  >39.00 mg/dL Final  . VLDL 07/15/2013 12.2  0.0 - 40.0 mg/dL Final  . LDL Cholesterol 07/15/2013 89  0 - 99 mg/dL Final  . Total CHOL/HDL Ratio 07/15/2013 2   Final                  Men          Women1/2 Average Risk     3.4          3.3Average Risk          5.0          4.42X Average Risk          9.6          7.13X Average Risk          15.0          11.0                      . NonHDL 07/15/2013 101.20   Final    Review of Systems:  HYPERTENSION:  mild and previously  controlled with Maxzide only He thinks he gets congestion of his eyes with taking Maxide but is taking it recently. Does not check blood pressure at home  HYPERLIPIDEMIA: The lipid abnormality consists of elevated LDL which was 94 previously; however before adding WelChol was 157. Has been on 40 mg atorvastatin since 12/13 and Was also taking WelChol since 10/13 He has not refilled his WelChol but his lipids look well controlled However he did have particle number of [id number] and this will need to be checked again  He is asking about a corn on his plantar surface     Examination:   BP  125/88  Pulse 72  Temp(Src) 97.8 F (36.6 C)  Resp 16  Ht $R'6\' 4"'Nk$  (1.93 m)  Wt 194 lb 6.4 oz (88.179 kg)  BMI 23.67 kg/m2  SpO2 98%  Body mass index is 23.67 kg/(m^2).   Diabetic foot exam shows normal monofilament sensation in the toes and plantar surfaces, no skin lesions or ulcers on the feet and normal pedal pulses  ASSESSMENT/ PLAN:   Diabetes type 2 with BMI 24  He has had mild diabetes with good control of Actoplusmet Recently has lost a significant amount of weight with doing very well on his diet since his last visit Also compliant with exercise Discussed that he has good controlled with upper normal A1c although lab glucose was relatively higher at 111  Will continue the same regimen and followup in 6 months He is not breaking on home glucose monitoring and since he has had consistent controlled over the last few years will not start this  No evidence of neuropathy; he can discuss his corn with her diet test  HYPERLIPIDEMIA with increased LDL particle number a few months ago. LDL is controlled with Lipitor alone and will not restart WelChol. Check LDL particle number on the next visit as it may have improved with his weight loss and change in diet  Hypertension: Not controlled with Maxide and will change to Zestoretic. He will see his PCP next month for followup and further adjustment; renal function has been normal as well as microalbumin Will need microalbumin checked on the next visit   Counseling time over 50% of today's 25 minute visit  Shiv Shuey 07/18/2013, 9:02 AM

## 2013-07-29 ENCOUNTER — Telehealth: Payer: Self-pay | Admitting: Endocrinology

## 2013-07-29 ENCOUNTER — Other Ambulatory Visit: Payer: Self-pay | Admitting: *Deleted

## 2013-07-29 MED ORDER — ATORVASTATIN CALCIUM 10 MG PO TABS: 40.0000 mg | ORAL_TABLET | Freq: Every day | ORAL | Status: DC

## 2013-07-29 NOTE — Telephone Encounter (Signed)
Pt needs the cholesterol rx called into [facility] 90 day supply

## 2013-07-29 NOTE — Telephone Encounter (Signed)
rx sent

## 2013-08-15 ENCOUNTER — Ambulatory Visit (INDEPENDENT_AMBULATORY_CARE_PROVIDER_SITE_OTHER): Payer: BC Managed Care – PPO | Admitting: Podiatry

## 2013-08-15 ENCOUNTER — Encounter: Payer: Self-pay | Admitting: Podiatry

## 2013-08-15 VITALS — BP 125/74 | HR 71 | Resp 13 | Ht 75.0 in | Wt 190.0 lb

## 2013-08-15 DIAGNOSIS — M79609 Pain in unspecified limb: Secondary | ICD-10-CM

## 2013-08-15 DIAGNOSIS — E119 Type 2 diabetes mellitus without complications: Secondary | ICD-10-CM

## 2013-08-15 DIAGNOSIS — M79673 Pain in unspecified foot: Secondary | ICD-10-CM

## 2013-08-15 DIAGNOSIS — B351 Tinea unguium: Secondary | ICD-10-CM

## 2013-08-15 NOTE — Progress Notes (Signed)
   Subjective:    Patient ID: Cameron Cardenas, male    DOB: February 13, 1955, 58 y.o.   MRN: [id number]  HPI Comments: Pt states he has had a hard core lesion on the right plantar 1st MPJ sub area for 2 months.  Pt request trimming of the area and 10 toenails.     Review of Systems     Objective:   Physical Exam        Assessment & Plan:

## 2013-08-15 NOTE — Progress Notes (Signed)
Subjective:     Patient ID: JASN XIA, male   DOB: 01/30/1955, 58 y.o.   MRN: [id number]  HPI patient presents with thick painful nailbeds 1-5 both feet that he cannot cut   Review of Systems     Objective:   Physical Exam Neurovascular status intact with thick brittle yellow nailbeds 1-5 both feet that are painful    Assessment:     Mycotic nail infection with pain 1-5 both feet    Plan:     Debride painful nailbeds 1-5 both feet with no iatrogenic bleeding noted and debrided tissue plantar of the right foot

## 2013-09-11 ENCOUNTER — Other Ambulatory Visit: Payer: Self-pay | Admitting: Internal Medicine

## 2013-09-11 DIAGNOSIS — IMO0002 Reserved for concepts with insufficient information to code with codable children: Secondary | ICD-10-CM

## 2013-09-18 ENCOUNTER — Other Ambulatory Visit: Payer: BC Managed Care – PPO

## 2013-10-14 ENCOUNTER — Other Ambulatory Visit: Payer: Self-pay | Admitting: *Deleted

## 2013-10-14 MED ORDER — PIOGLITAZONE HCL-METFORMIN HCL 15-500 MG PO TABS: 1.0000 | ORAL_TABLET | Freq: Two times a day (BID) | ORAL | Status: DC

## 2013-11-26 ENCOUNTER — Encounter: Payer: Self-pay | Admitting: Internal Medicine

## 2013-12-02 ENCOUNTER — Telehealth: Payer: Self-pay | Admitting: Endocrinology

## 2013-12-02 NOTE — Telephone Encounter (Signed)
Patient has a really bad dry cough and a runny nose. Is there something Dr can call into his pharmacy. Please advise [facility] on [facility]

## 2013-12-18 ENCOUNTER — Other Ambulatory Visit: Payer: Self-pay | Admitting: Endocrinology

## 2014-01-13 ENCOUNTER — Other Ambulatory Visit: Payer: Self-pay | Admitting: Endocrinology

## 2014-01-14 ENCOUNTER — Telehealth: Payer: Self-pay | Admitting: Endocrinology

## 2014-01-14 ENCOUNTER — Other Ambulatory Visit: Payer: Self-pay | Admitting: *Deleted

## 2014-01-14 MED ORDER — ATORVASTATIN CALCIUM 10 MG PO TABS: ORAL_TABLET | ORAL | Status: DC

## 2014-01-14 NOTE — Telephone Encounter (Signed)
Patient states he is having a refill sent by his pharmacy  Please fill for 90 day supply   Thank you

## 2014-01-15 ENCOUNTER — Other Ambulatory Visit: Payer: Self-pay | Admitting: *Deleted

## 2014-01-15 ENCOUNTER — Other Ambulatory Visit: Payer: Managed Care, Other (non HMO)

## 2014-01-15 MED ORDER — ATORVASTATIN CALCIUM 40 MG PO TABS: 40.0000 mg | ORAL_TABLET | Freq: Every day | ORAL | Status: DC

## 2014-01-22 ENCOUNTER — Ambulatory Visit: Payer: Managed Care, Other (non HMO) | Admitting: Endocrinology

## 2014-01-28 ENCOUNTER — Ambulatory Visit: Payer: BC Managed Care – PPO | Admitting: Internal Medicine

## 2014-04-06 ENCOUNTER — Emergency Department (HOSPITAL_COMMUNITY)
Admission: EM | Admit: 2014-04-06 | Discharge: 2014-04-07 | Disposition: A | Discharge status: Home / Self Care | Payer: Self-pay | Attending: Emergency Medicine | Admitting: Emergency Medicine

## 2014-04-06 ENCOUNTER — Emergency Department (HOSPITAL_COMMUNITY): Payer: Self-pay

## 2014-04-06 ENCOUNTER — Encounter (HOSPITAL_COMMUNITY): Payer: Self-pay | Admitting: Emergency Medicine

## 2014-04-06 DIAGNOSIS — Z7982 Long term (current) use of aspirin: Secondary | ICD-10-CM | POA: Insufficient documentation

## 2014-04-06 DIAGNOSIS — Z79899 Other long term (current) drug therapy: Secondary | ICD-10-CM | POA: Insufficient documentation

## 2014-04-06 DIAGNOSIS — E780 Pure hypercholesterolemia: Secondary | ICD-10-CM | POA: Insufficient documentation

## 2014-04-06 DIAGNOSIS — E119 Type 2 diabetes mellitus without complications: Secondary | ICD-10-CM | POA: Insufficient documentation

## 2014-04-06 DIAGNOSIS — Z87891 Personal history of nicotine dependence: Secondary | ICD-10-CM | POA: Insufficient documentation

## 2014-04-06 DIAGNOSIS — I10 Essential (primary) hypertension: Secondary | ICD-10-CM | POA: Insufficient documentation

## 2014-04-06 DIAGNOSIS — M79661 Pain in right lower leg: Secondary | ICD-10-CM | POA: Insufficient documentation

## 2014-04-06 DIAGNOSIS — M25551 Pain in right hip: Secondary | ICD-10-CM | POA: Insufficient documentation

## 2014-04-06 MED ORDER — KETOROLAC TROMETHAMINE 30 MG/ML IJ SOLN
30.0000 mg | Freq: Once | INTRAMUSCULAR | Status: AC
  Administered 2014-04-06: 30 mg via INTRAVENOUS
  Filled 2014-04-06: qty 1

## 2014-04-06 MED ORDER — HYDROMORPHONE HCL 1 MG/ML IJ SOLN
1.0000 mg | Freq: Once | INTRAMUSCULAR | Status: AC
  Administered 2014-04-06: 1 mg via INTRAVENOUS
  Filled 2014-04-06: qty 1

## 2014-04-06 NOTE — ED Notes (Signed)
Patient transported to X-ray 

## 2014-04-06 NOTE — ED Provider Notes (Signed)
CSN: [id number]     Arrival date & time 04/06/14  2018 History  This chart was scribed for non-physician practitioner, Antonietta Breach, PA working with Debby Freiberg, MD by Tula Nakayama, ED scribe. This patient was seen in room WA04/WA04 and the patient's care was started at 10:31 PM   Chief Complaint  Patient presents with  . Hip Pain  . Leg Pain   The history is provided by the patient. No language interpreter was used.    HPI Comments: Cameron Cardenas is a 59 y.o. male with a history of DM who presents to the Emergency Department complaining of constant, moderate right lower back and hip pain that started 3 weeks ago. Pt states a separate right leg pain that occurs with bearing weight and mild right ankle swelling as associated symptoms. He has tried Vicodin and Tramadol with no relief. Pt was evaluated for the same pain by his PCP at the onset of symptoms who diagnosed him with sciatica and prescribed him pain medication. He also reports a history of back surgery in 2011 for pain of the same quality and in the same location. Pt denies recent travel, surgeries and a family history of PE/DVT. He also denies fever, hemoptysis, hematuria and dysuria as associated symptoms.  Neurosurgeon - Dr. Vertell Limber  Past Medical History  Diagnosis Date  . Lumbago   . Essential hypertension, malignant   . Type II or unspecified type diabetes mellitus without mention of complication, not stated as uncontrolled   . Pure hypercholesterolemia   . Allergic rhinitis due to pollen   . Unspecified vitamin D deficiency    Past Surgical History  Procedure Laterality Date  . Lumbar disc surgery  2011  . Colonoscopy      in [facility], MD no longer in practice, does not recall the name of facility. Does belive polyps were removed   Family History  Problem Relation Age of Onset  . Colon cancer Neg Hx   . Esophageal cancer Neg Hx   . Rectal cancer Neg Hx   . Stomach cancer Neg Hx    History  Substance Use Topics  .  Smoking status: Former Smoker    Quit date: 08/28/1992  . Smokeless tobacco: Never Used  . Alcohol Use: Yes     Comment: occassional    Review of Systems  Cardiovascular: Positive for leg swelling.  Musculoskeletal: Positive for back pain and arthralgias.  All other systems reviewed and are negative.   Allergies  Review of patient's allergies indicates no known allergies.  Home Medications   Prior to Admission medications   Medication Sig Start Date End Date Taking? Authorizing Provider  aspirin 81 MG tablet Take 81 mg by mouth daily.   Yes Historical Provider, MD  atorvastatin (LIPITOR) 40 MG tablet Take 1 tablet (40 mg total) by mouth daily. 01/15/14  Yes Elayne Snare, MD  Cetirizine HCl (ZYRTEC PO) Take 1 tablet by mouth as needed.   Yes Historical Provider, MD  lisinopril-hydrochlorothiazide (PRINZIDE,ZESTORETIC) 10-12.5 MG per tablet Take 1 tablet by mouth daily. 07/18/13  Yes Elayne Snare, MD  pioglitazone-metformin (ACTOPLUS MET) 15-500 MG per tablet Take 1 tablet by mouth 2 (two) times daily. 10/14/13  Yes Elayne Snare, MD  Specialty Vitamins Products (PROSTATE PO) Take by mouth.     Yes Historical Provider, MD  methocarbamol (ROBAXIN) 500 MG tablet Take 1 tablet (500 mg total) by mouth 2 (two) times daily. 04/07/14   Antonietta Breach, PA-C   BP 135/80 mmHg  Pulse 57  Temp(Src) 97.9 F (36.6 C) (Oral)  Resp 14  SpO2 100%   Physical Exam  Constitutional: He is oriented to person, place, and time. He appears well-developed and well-nourished. No distress.  Nontoxic/nonseptic appearing  HENT:  Head: Normocephalic and atraumatic.  Eyes: Conjunctivae and EOM are normal. No scleral icterus.  Neck: Normal range of motion.  Cardiovascular: Normal rate, regular rhythm and intact distal pulses.   DP and PT pulses 2+ b/l  Pulmonary/Chest: Effort normal. No respiratory distress.  Respirations even and unlabored  Musculoskeletal: Normal range of motion. He exhibits tenderness.  TTP to R  lumbar paraspinal muscles and mildly when palpating anterolateral RLE. No pitting edema in b/l lower extremities. No erythema or palpable cords. No TTP in the popliteal fossa.  Neurological: He is alert and oriented to person, place, and time. He exhibits normal muscle tone. Coordination normal.  Sensation to light touch intact in b/l lower extremities. Patellar and achilles reflexes intact. Patient ambulatory in the ED.  Skin: Skin is warm and dry. No rash noted. He is not diaphoretic. No erythema. No pallor.  Psychiatric: He has a normal mood and affect. His behavior is normal.  Nursing note and vitals reviewed.   ED Course  Procedures   DIAGNOSTIC STUDIES: Oxygen Saturation is 100% on RA, normal by my interpretation.    COORDINATION OF CARE: 10:39 PM Discussed treatment plan with pt which includes D-dimer and x-rays of lumbar spine and right hip. Pt agreed to plan.  Labs Review Labs Reviewed  D-DIMER, QUANTITATIVE    Imaging Review Dg Lumbar Spine Complete  04/06/2014   CLINICAL DATA:  Low back/ lumbar spine and right hip pain for 3-4 weeks. History of lumbar spine surgery.  EXAM: LUMBAR SPINE - COMPLETE 4+ VIEW  COMPARISON:  Lumbar spine MRI 12/24/2009  FINDINGS: There 4 non-rib-bearing lumbar vertebra. The lower most non-rib-bearing lumbar vertebra will be referred to as L5 in continuity with numbering on prior MRI. There is disc space narrowing at L5-S1 with associated endplate spurs. Mild disc space narrowing at L4-L5. The alignment is maintained. Vertebral body heights are normal. There is no listhesis. The posterior elements are intact. No fracture. Sacroiliac joints are symmetric and normal.  IMPRESSION: Degenerative disc disease at L5-S1. Mild disc space narrowing at L4-L5.  Note there are 4 non-rib-bearing lumbar vertebra, the lower most non-rib-bearing lumbar vertebra was referred to as L5 in continuity with prior MRI.   Electronically Signed   By: Jeb Levering M.D.   On:  04/06/2014 23:14   Dg Hip Unilat With Pelvis 2-3 Views Right  04/06/2014   CLINICAL DATA:  Subacute onset of right hip and lower back pain for 3-4 weeks. Initial encounter.  EXAM: RIGHT HIP (WITH PELVIS) 2-3 VIEWS  COMPARISON:  There is no evidence of fracture or dislocation. Both femoral heads are seated normally within their respective acetabula. The proximal right femur appears intact. No significant degenerative change is appreciated. The sacroiliac joints are unremarkable in appearance.  The visualized bowel gas pattern is grossly unremarkable in appearance.  FINDINGS: No evidence of fracture or dislocation.   Electronically Signed   By: Garald Balding M.D.   On: 04/06/2014 23:14     EKG Interpretation None      MDM   Final diagnoses:  Right hip pain  Pain of right lower leg    59 year old nontoxic appearing male presents to the emergency department for further evaluation of right hip and leg pain 3 weeks, worsening  over the last few days. Patient denies any trauma or injury to his back, hip, or leg. He is neurovascularly intact on exam. No fever, red flags, or signs concerning for cauda equina. Given that patient's symptoms were less characteristic of sciatica, d-dimer obtained to evaluate for possibility of DVT. D-dimer is negative and patient has no palpable calf tenderness or lower extremity swelling. His tachycardia has resolved with pain medication; I suspect his tachycardia was secondary to pain. Xray negative for acute changes.  No indication for further workup at this time. Have advised the patient continue with tramadol or Norco and to add Robaxin to this regimen. Will withhold regular steroids and NSAID use given diabetic history. Have recommended ice and heat as well as PCP and neurosurgery f/u. Return precautions discussed and provided. Patient agreeable to plan with no unaddressed concerns. Patient discharged in good condition.  I personally performed the services described  in this documentation, which was scribed in my presence. The recorded information has been reviewed and is accurate.   Filed Vitals:   04/06/14 2027 04/06/14 2306 04/07/14 0031  BP: 158/101 118/67 135/80  Pulse: 113 53 57  Temp: 97.9 F (36.6 C)    TempSrc: Oral    Resp: _0 SpO2: 100% 99% 100%     Antonietta Breach, PA-C 04/07/14 0037  Debby Freiberg, MD 04/07/14 [phone]

## 2014-04-06 NOTE — ED Notes (Signed)
Pt states that he has had R hip and leg pain x 3 days. Dx with sciatica pain at PCP but is still hurting with vicodin and tramadol. Alert and oriented.

## 2014-04-07 LAB — D-DIMER, QUANTITATIVE: D-Dimer, Quant: <0.27 ug/mL-FEU (ref 0.00–0.48)

## 2014-04-07 MED ORDER — METHOCARBAMOL 500 MG PO TABS: 500.0000 mg | ORAL_TABLET | Freq: Two times a day (BID) | ORAL | Status: DC

## 2014-04-07 NOTE — Discharge Instructions (Signed)
Recommend adding Robaxin to your pain medications. Continue taking Tramadol OR Norco for pain. Alternate ice and heat to areas of discomfort. Follow up with your pediatrician and, if needed, your neurosurgeon.  Sciatica Sciatica is pain, weakness, numbness, or tingling along the path of the sciatic nerve. The nerve starts in the lower back and runs down the back of each leg. The nerve controls the muscles in the lower leg and in the back of the knee, while also providing sensation to the back of the thigh, lower leg, and the sole of your foot. Sciatica is a symptom of another medical condition. For instance, nerve damage or certain conditions, such as a herniated disk or bone spur on the spine, pinch or put pressure on the sciatic nerve. This causes the pain, weakness, or other sensations normally associated with sciatica. Generally, sciatica only affects one side of the body. CAUSES   Herniated or slipped disc.  Degenerative disk disease.  A pain disorder involving the narrow muscle in the buttocks (piriformis syndrome).  Pelvic injury or fracture.  Pregnancy.  Tumor (rare). SYMPTOMS  Symptoms can vary from mild to very severe. The symptoms usually travel from the low back to the buttocks and down the back of the leg. Symptoms can include:  Mild tingling or dull aches in the lower back, leg, or hip.  Numbness in the back of the calf or sole of the foot.  Burning sensations in the lower back, leg, or hip.  Sharp pains in the lower back, leg, or hip.  Leg weakness.  Severe back pain inhibiting movement. These symptoms may get worse with coughing, sneezing, laughing, or prolonged sitting or standing. Also, being overweight may worsen symptoms. DIAGNOSIS  Your caregiver will perform a physical exam to look for common symptoms of sciatica. He or she may ask you to do certain movements or activities that would trigger sciatic nerve pain. Other tests may be performed to find the cause of  the sciatica. These may include:  Blood tests.  X-rays.  Imaging tests, such as an MRI or CT scan. TREATMENT  Treatment is directed at the cause of the sciatic pain. Sometimes, treatment is not necessary and the pain and discomfort goes away on its own. If treatment is needed, your caregiver may suggest:  Over-the-counter medicines to relieve pain.  Prescription medicines, such as anti-inflammatory medicine, muscle relaxants, or narcotics.  Applying heat or ice to the painful area.  Steroid injections to lessen pain, irritation, and inflammation around the nerve.  Reducing activity during periods of pain.  Exercising and stretching to strengthen your abdomen and improve flexibility of your spine. Your caregiver may suggest losing weight if the extra weight makes the back pain worse.  Physical therapy.  Surgery to eliminate what is pressing or pinching the nerve, such as a bone spur or part of a herniated disk. HOME CARE INSTRUCTIONS   Only take over-the-counter or prescription medicines for pain or discomfort as directed by your caregiver.  Apply ice to the affected area for 20 minutes, 3-4 times a day for the first 48-72 hours. Then try heat in the same way.  Exercise, stretch, or perform your usual activities if these do not aggravate your pain.  Attend physical therapy sessions as directed by your caregiver.  Keep all follow-up appointments as directed by your caregiver.  Do not wear high heels or shoes that do not provide proper support.  Check your mattress to see if it is too soft. A firm mattress  may lessen your pain and discomfort. SEEK IMMEDIATE MEDICAL CARE IF:   You lose control of your bowel or bladder (incontinence).  You have increasing weakness in the lower back, pelvis, buttocks, or legs.  You have redness or swelling of your back.  You have a burning sensation when you urinate.  You have pain that gets worse when you lie down or awakens you at  night.  Your pain is worse than you have experienced in the past.  Your pain is lasting longer than 4 weeks.  You are suddenly losing weight without reason. MAKE SURE YOU:  Understand these instructions.  Will watch your condition.  Will get help right away if you are not doing well or get worse. Document Released: 12/21/2000 Document Revised: 06/28/2011 Document Reviewed: 05/08/2011 [facility] Patient Information 2015 [place], [place]. This information is not intended to replace advice given to you by your health care provider. Make sure you discuss any questions you have with your health care provider.  Muscle Pain Muscle pain (myalgia) may be caused by many things, including:  Overuse or muscle strain, especially if you are not in shape. This is the most common cause of muscle pain.  Injury.  Bruises.  Viruses, such as the flu.  Infectious diseases.  Fibromyalgia, which is a chronic condition that causes muscle tenderness, fatigue, and headache.  Autoimmune diseases, including lupus.  Certain drugs, including ACE inhibitors and statins. Muscle pain may be mild or severe. In most cases, the pain lasts only a short time and goes away without treatment. To diagnose the cause of your muscle pain, your health care provider will take your medical history. This means he or she will ask you when your muscle pain began and what has been happening. If you have not had muscle pain for very long, your health care provider may want to wait before doing much testing. If your muscle pain has lasted a long time, your health care provider may want to run tests right away. If your health care provider thinks your muscle pain may be caused by illness, you may need to have additional tests to rule out certain conditions.  Treatment for muscle pain depends on the cause. Home care is often enough to relieve muscle pain. Your health care provider may also prescribe anti-inflammatory medicine. HOME CARE  INSTRUCTIONS Watch your condition for any changes. The following actions may help to lessen any discomfort you are feeling:  Only take over-the-counter or prescription medicines as directed by your health care provider.  Apply ice to the sore muscle:  Put ice in a plastic bag.  Place a towel between your skin and the bag.  Leave the ice on for 15-20 minutes, 3-4 times a day.  You may alternate applying hot and cold packs to the muscle as directed by your health care provider.  If overuse is causing your muscle pain, slow down your activities until the pain goes away.  Remember that it is normal to feel some muscle pain after starting a workout program. Muscles that have not been used often will be sore at first.  Do regular, gentle exercises if you are not usually active.  Warm up before exercising to lower your risk of muscle pain.  Do not continue working out if the pain is very bad. Bad pain could mean you have injured a muscle. SEEK MEDICAL CARE IF:  Your muscle pain gets worse, and medicines do not help.  You have muscle pain that lasts longer than  3 days.  You have a rash or fever along with muscle pain.  You have muscle pain after a tick bite.  You have muscle pain while working out, even though you are in good physical condition.  You have redness, soreness, or swelling along with muscle pain.  You have muscle pain after starting a new medicine or changing the dose of a medicine. SEEK IMMEDIATE MEDICAL CARE IF:  You have trouble breathing.  You have trouble swallowing.  You have muscle pain along with a stiff neck, fever, and vomiting.  You have severe muscle weakness or cannot move part of your body. MAKE SURE YOU:   Understand these instructions.  Will watch your condition.  Will get help right away if you are not doing well or get worse. Document Released: 11/18/2005 Document Revised: 01/01/2013 Document Reviewed: 10/23/2012 [facility] Patient  Information 2015 [place], [place]. This information is not intended to replace advice given to you by your health care provider. Make sure you discuss any questions you have with your health care provider.

## 2014-04-07 NOTE — ED Notes (Signed)
Pt was able to ambulate for about 15 feet without pain or assistance.

## 2014-04-18 ENCOUNTER — Other Ambulatory Visit: Payer: Self-pay | Admitting: Sports Medicine

## 2014-04-18 DIAGNOSIS — M545 Low back pain: Secondary | ICD-10-CM

## 2014-04-24 ENCOUNTER — Ambulatory Visit
Admission: RE | Admit: 2014-04-24 | Discharge: 2014-04-24 | Disposition: A | Discharge status: Home / Self Care | Payer: 59 | Source: Ambulatory Visit | Attending: Sports Medicine | Admitting: Sports Medicine

## 2014-04-24 DIAGNOSIS — M545 Low back pain: Secondary | ICD-10-CM

## 2014-05-05 ENCOUNTER — Encounter: Payer: Self-pay | Admitting: Podiatry

## 2014-05-05 ENCOUNTER — Ambulatory Visit (INDEPENDENT_AMBULATORY_CARE_PROVIDER_SITE_OTHER): Payer: 59

## 2014-05-05 ENCOUNTER — Ambulatory Visit (INDEPENDENT_AMBULATORY_CARE_PROVIDER_SITE_OTHER): Payer: 59 | Admitting: Podiatry

## 2014-05-05 VITALS — BP 115/80 | HR 79 | Temp 98.6°F | Resp 14

## 2014-05-05 DIAGNOSIS — R52 Pain, unspecified: Secondary | ICD-10-CM

## 2014-05-05 DIAGNOSIS — S93401A Sprain of unspecified ligament of right ankle, initial encounter: Secondary | ICD-10-CM

## 2014-05-05 NOTE — [facility] Notes (Signed)
   Subjective:    Patient ID: Cameron Cardenas, male    DOB: 08/06/1955, 59 y.o.   MRN: [id number]  HPI   N-pain L bottom of right foot, upper leg and ankle D-2 months L-woke up with pain O-pain that goes from the bottom of foot into the ankle and upper side of leg A-can hardly stand on it when gettting up in the morning T- taking ibuprofren and aleve which eases it off a little bit  "hurts in bottom of right foot and upper side of  leg and ankle"  This patient describes pain that occurs on his lateral ankle and foot especially uncomfortable firstStep the morning and persisted with weightbearing throughout the day. He describes extremely sharp pains intermittently at times during this area. It causes him to limp when he walks.  Patient describes a history of back pain with a pending MRI  Patient works as a [employer]  He has a type II diabetic Review of Systems  All other systems reviewed and are negative.      Objective:   Physical Exam  Orientated 3  Vascular: DP pulses 2/4 bilaterally PT pulses 2/4 bilaterally Capillary reflex immediate bilaterally There is no calf edema or calf tenderness to direct palpation bilaterally No edema noted bilaterally  Neurological: Ankle reflex equal and reactive bilaterally Vibratory sensation intact bilaterally Sensation to 10 g monofilament wire intact 5/5 bilaterally  Dermatological: Texture and turgor within normal limits bilaterally No open wounds or skin lesions noted bilaterally No increase in warmth foot ankle bilaterally The toenails are brittle, dystrophic, discolored 6-10  Musculoskeletal: Patient has a limping gait favoring the right foot Patient is able to heel off unilaterally or bilaterally There is no restriction ankle, subtalar, midtarsal joints bilaterally There is no palpable tenderness in the foot or ankle on range of motion or direct palpation Upon weight-bearing again there are no areas of palpable  tenderness in the right foot or ankle area Not able to elicit any palpable tenderness other than a mild reaction in the anterior lateral ankle right which was similar to the anterior ankle left  X-ray examination right foot/ankle weightbearing  Intact bony structure without fracture and/or dislocation Bone density appears adequate throughout all views Ankle mortise is intact without arthritic changes Posterior calcaneal spur Narrowing of first MPJ Dorsal proliferation the head of left first metatarsal Wedge-shaped navicular Pes planus No increase in soft tissue density noted in foot or ankle views No emphysema noted  Radiographic impression: No acute bony abnormality noted in the right foot and/or ankle       Assessment & Plan:   Assessment: Satisfactory neurovascular status bilaterally No acute bony abnormality noted Sprain strain right ankle Possible referred pain from back pain  Plan: I review the results of examination and x-ray with patient today. I advised him I could not find any acute problems at this time. I recommended that he see his orthopedic doctor and have his back evaluated I dispensed an ankle stabilizer to wear in the right ankle on a continuous basis throughout the day Okay to use over-the-counter ibuprofen when necessary  Reappoint 4 weeks or sooner if patient has concern

## 2014-05-05 NOTE — Patient Instructions (Signed)
Wear the ankle table eyes and right ankle on a continuous basis Apply in the morning and remove at bedtime Diabetes and Foot Care Diabetes may cause you to have problems because of poor blood supply (circulation) to your feet and legs. This may cause the skin on your feet to become thinner, break easier, and heal more slowly. Your skin may become dry, and the skin may peel and crack. You may also have nerve damage in your legs and feet causing decreased feeling in them. You may not notice minor injuries to your feet that could lead to infections or more serious problems. Taking care of your feet is one of the most important things you can do for yourself.  HOME CARE INSTRUCTIONS  Wear shoes at all times, even in the house. Do not go barefoot. Bare feet are easily injured.  Check your feet daily for blisters, cuts, and redness. If you cannot see the bottom of your feet, use a mirror or ask someone for help.  Wash your feet with warm water (do not use hot water) and mild soap. Then pat your feet and the areas between your toes until they are completely dry. Do not soak your feet as this can dry your skin.  Apply a moisturizing lotion or petroleum jelly (that does not contain alcohol and is unscented) to the skin on your feet and to dry, brittle toenails. Do not apply lotion between your toes.  Trim your toenails straight across. Do not dig under them or around the cuticle. File the edges of your nails with an emery board or nail file.  Do not cut corns or calluses or try to remove them with medicine.  Wear clean socks or stockings every day. Make sure they are not too tight. Do not wear knee-high stockings since they may decrease blood flow to your legs.  Wear shoes that fit properly and have enough cushioning. To break in new shoes, wear them for just a few hours a day. This prevents you from injuring your feet. Always look in your shoes before you put them on to be sure there are no objects  inside.  Do not cross your legs. This may decrease the blood flow to your feet.  If you find a minor scrape, cut, or break in the skin on your feet, keep it and the skin around it clean and dry. These areas may be cleansed with mild soap and water. Do not cleanse the area with peroxide, alcohol, or iodine.  When you remove an adhesive bandage, be sure not to damage the skin around it.  If you have a wound, look at it several times a day to make sure it is healing.  Do not use heating pads or hot water bottles. They may burn your skin. If you have lost feeling in your feet or legs, you may not know it is happening until it is too late.  Make sure your health care provider performs a complete foot exam at least annually or more often if you have foot problems. Report any cuts, sores, or bruises to your health care provider immediately. SEEK MEDICAL CARE IF:   You have an injury that is not healing.  You have cuts or breaks in the skin.  You have an ingrown nail.  You notice redness on your legs or feet.  You feel burning or tingling in your legs or feet.  You have pain or cramps in your legs and feet.  Your legs or  feet are numb.  Your feet always feel cold. SEEK IMMEDIATE MEDICAL CARE IF:   There is increasing redness, swelling, or pain in or around a wound.  There is a red line that goes up your leg.  Pus is coming from a wound.  You develop a fever or as directed by your health care provider.  You notice a bad smell coming from an ulcer or wound. Document Released: 12/25/1999 Document Revised: 08/29/2012 Document Reviewed: 06/05/2012 [facility] Patient Information 2015 [place], [place]. This information is not intended to replace advice given to you by your health care provider. Make sure you discuss any questions you have with your health care provider.

## 2014-05-06 ENCOUNTER — Ambulatory Visit: Admission: RE | Admit: 2014-05-06 | Payer: 59 | Source: Ambulatory Visit

## 2014-05-06 ENCOUNTER — Other Ambulatory Visit: Payer: Self-pay | Admitting: Sports Medicine

## 2014-05-06 ENCOUNTER — Ambulatory Visit
Admission: RE | Admit: 2014-05-06 | Discharge: 2014-05-06 | Disposition: A | Discharge status: Home / Self Care | Payer: 59 | Source: Ambulatory Visit | Attending: Sports Medicine | Admitting: Sports Medicine

## 2014-05-06 DIAGNOSIS — M545 Low back pain: Secondary | ICD-10-CM

## 2014-05-06 MED ORDER — GADOBENATE DIMEGLUMINE 529 MG/ML IV SOLN
20.0000 mL | Freq: Once | INTRAVENOUS | Status: AC | PRN
  Administered 2014-05-06: 20 mL via INTRAVENOUS

## 2014-05-07 ENCOUNTER — Other Ambulatory Visit: Payer: Self-pay

## 2014-05-14 ENCOUNTER — Other Ambulatory Visit: Payer: 59

## 2014-05-15 ENCOUNTER — Other Ambulatory Visit: Payer: Self-pay | Admitting: Endocrinology

## 2014-05-28 ENCOUNTER — Other Ambulatory Visit: Payer: Self-pay | Admitting: Neurosurgery

## 2014-06-02 ENCOUNTER — Ambulatory Visit (INDEPENDENT_AMBULATORY_CARE_PROVIDER_SITE_OTHER): Payer: 59 | Admitting: Podiatry

## 2014-06-02 ENCOUNTER — Ambulatory Visit: Payer: 59 | Admitting: Podiatry

## 2014-06-02 DIAGNOSIS — E119 Type 2 diabetes mellitus without complications: Secondary | ICD-10-CM | POA: Diagnosis not present

## 2014-06-02 DIAGNOSIS — M79675 Pain in left toe(s): Secondary | ICD-10-CM

## 2014-06-02 DIAGNOSIS — B351 Tinea unguium: Secondary | ICD-10-CM | POA: Diagnosis not present

## 2014-06-02 DIAGNOSIS — M79674 Pain in right toe(s): Secondary | ICD-10-CM | POA: Diagnosis not present

## 2014-06-02 DIAGNOSIS — M79673 Pain in unspecified foot: Secondary | ICD-10-CM

## 2014-06-02 LAB — HM DIABETES FOOT EXAM: HM Diabetic Foot Exam: NORMAL

## 2014-06-02 NOTE — [facility] Notes (Signed)
Subjective:     Patient ID: Cameron Cardenas, male   DOB: 05-13-1955, 59 y.o.   MRN: [id number]  HPI patient presents with thick painful nailbeds 1-5 both feet that he cannot cut. Patient is also long-term diabetes has flatfeet pain in his feet and shooting burning tingling pain at times Review of Systems     Objective:   Physical Exam Neurovascular status intact with thick brittle yellow nailbeds 1-5 both feet that are painful neuropathic changes associated with diabetes with flatfoot deformity and pain    Assessment:     Mycotic nail infection with pain 1-5 both feet. 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 Advised on long-term orthotics and we will get him approved for orthotics to help to control pathology

## 2014-06-03 NOTE — Pre-Procedure Instructions (Addendum)
    HOMERO HYSON  06/03/2014      [facility] - [place] ([place]), [place] [address] [place] ([place]) [place] [address] Phone: [phone] Fax: [phone]    Your procedure is scheduled on May 31st, Tuesday  Report to [facility].  Call this number if you have problems the morning of surgery:  [phone]   Remember:  Do not eat food or drink liquids after midnight Monday.  Take these medicines the morning of surgery with A SIP OF WATER : Zyrtec                            Do NOT take your diabetes medication the morning to surgery.   Do not wear jewelry-no rings or watches.  Do not wear lotions or colognes.   You may NOT wear deodorant the day of surgery.             Men may shave face and neck.  Do not bring valuables to the hospital.   [facility] is not responsible for any belongings or valuables.              Contacts, dentures or bridgework may not be worn into surgery.                                                                                                                                                                                           Leave your suitcase in the car.  After surgery it may be brought to your room.               For patients admitted to the hospital, discharge time will be determined by your treatment team.                  Name and phone number of your driver:                    Special instructions:  "Preparing for Surgery" instruction sheet.               Please read over the following fact sheets that you were given. Pain Booklet, Coughing and Deep Breathing, Blood Transfusion Information and MRSA Information

## 2014-06-04 ENCOUNTER — Encounter (HOSPITAL_COMMUNITY)
Admission: RE | Admit: 2014-06-04 | Discharge: 2014-06-04 | Disposition: A | Discharge status: Home / Self Care | Payer: 59 | Source: Ambulatory Visit | Attending: Neurosurgery | Admitting: Neurosurgery

## 2014-06-04 ENCOUNTER — Encounter (HOSPITAL_COMMUNITY): Payer: Self-pay

## 2014-06-04 DIAGNOSIS — Z01812 Encounter for preprocedural laboratory examination: Secondary | ICD-10-CM | POA: Insufficient documentation

## 2014-06-04 DIAGNOSIS — Z01810 Encounter for preprocedural cardiovascular examination: Secondary | ICD-10-CM | POA: Insufficient documentation

## 2014-06-04 DIAGNOSIS — E119 Type 2 diabetes mellitus without complications: Secondary | ICD-10-CM | POA: Diagnosis not present

## 2014-06-04 DIAGNOSIS — I10 Essential (primary) hypertension: Secondary | ICD-10-CM | POA: Diagnosis not present

## 2014-06-04 LAB — BASIC METABOLIC PANEL
ANION GAP: 8 (ref 5–15)
BUN: 13 mg/dL (ref 6–20)
CO2: 30 mmol/L (ref 22–32)
Calcium: 9.8 mg/dL (ref 8.9–10.3)
Chloride: 106 mmol/L (ref 101–111)
Creatinine, Ser: 1.25 mg/dL — ABNORMAL HIGH (ref 0.61–1.24)
GFR calc Af Amer: >60 mL/min (ref >60)
Glucose, Bld: 113 mg/dL — ABNORMAL HIGH (ref 65–99)
Potassium: 4.3 mmol/L (ref 3.5–5.1)
SODIUM: 144 mmol/L (ref 135–145)

## 2014-06-04 LAB — CBC
HCT: 42.5 % (ref 39.0–52.0)
HEMOGLOBIN: 14.5 g/dL (ref 13.0–17.0)
MCH: 28.6 pg (ref 26.0–34.0)
MCHC: 34.1 g/dL (ref 30.0–36.0)
MCV: 83.8 fL (ref 78.0–100.0)
Platelets: 134 10*3/uL — ABNORMAL LOW (ref 150–400)
RBC: 5.07 MIL/uL (ref 4.22–5.81)
RDW: 14.9 % (ref 11.5–15.5)
WBC: 2.7 10*3/uL — AB (ref 4.0–10.5)

## 2014-06-04 LAB — SURGICAL PCR SCREEN
MRSA, PCR: NEGATIVE
Staphylococcus aureus: NEGATIVE

## 2014-06-04 LAB — GLUCOSE, CAPILLARY: GLUCOSE-CAPILLARY: 111 mg/dL — AB (ref 65–99)

## 2014-06-04 NOTE — [facility] Notes (Addendum)
Anesthesia Chart Review:  Patient is a 59 year old male scheduled for redo decompression with MAS PLIF L5-S1 on 06/10/14 by Dr. Vertell Limber.  History includes HTN, former smoker, DM2, hypercholesterolemia, lumbar surgery 2011. BMi 23.77.    PCP is Dr. Lysle Rubens with [facility]. Endocrinologist is Dr. Dwyane Dee. He saw cardiologist Dr. Terrence Dupont in the past for an abnormal EKG and had a normal stress test in 2011. His last visit was > 1 year ago (EKG received from Dr. Terrence Dupont was from 12/278/13.)   Meds include ASA, Lipitor, Zyrtec, pioglitazone-metformin, Robaxin.   06/04/14 EKG: NSR, RBBB, LAFB, bifascicular block, voltage criteria for LVH. Has new RBBB with QRS duration increased from 124 ms to 164 ms since 07/30/09 EKG. Per his PAT RN, he denied any cardiopulmonary issues at PAT. I called and spoke with patient. He denied having a diagnosis of CAD/MI or cardiac procedure.  He denied chest pain or SOB.  He stopped exercising ~ 3 months ago when he developed RLE pain, but prior to that he was walking on a treadmill at home for 1-1 1/2 hours without chest pain or SOB.  Dr. Alice Reichert office only sent 2013 EKG, otherwise they thought he would need to be seen again.   08/13/09 Nuclear stress test: IMPRESSION: Normal examination without evidence of exercise induced myocardial ischemia. The calculated left ventricular ejection fraction is 60%.  05/06/14 MRI L-spine: IMPRESSION: 1. Minimally increased size of the right subarticular disc extrusion at L5-S1 with mass effect on the right S1 nerve root. 2. Mild spondylosis at L4-5 without significant stenosis.  Preoperative labs noted. WBC 2.7 (3.4 on 07/30/09), H/H 14.5/42.5, PLT 134K. Cr 1.25, glucose 113. A1C is pending (was 5.7 07/15/13).  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   EKGs, history, and records from Dr. Zenia Resides office reviewed with anesthesiologist Dr. Marcie Bal.  Patient denied CAD history, and with previous normal stress test has been receiving care primarily through his PCP. 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 He denied CV symptoms at PAT. He reported good exercise tolerance up until three months ago due to RLE pain. Recommended patient contact Dr. Terrence Dupont or his PCP to determine appropriate future follow-up (discussed with patient), but unless he developed new CV symptoms then it is anticipated that he can proceed as planned.  George Hugh [facility] Short [facility]/Anesthesiology Phone [phone] 06/04/2014 5:45 PM

## 2014-06-04 NOTE — [facility] Notes (Signed)
PCP is Dr. Deforest Hoyles  LOV 1 mth ago. Endocrinologist is Dr. Dwyane Dee  LOV 6 mths ago Cardio was Dr. Terrence Dupont  LOV over 1 yr ago. Pt saw Dr. Marlou Sa yrs ago, Marlou Sa saw something on EKG, sent him to Dr. Terrence Dupont.  Stress done in 2011.  Negative results.  Cameron Cardenas has not been back since and denies any cardiac issues at the present.   He checks his sugar "once in a blue moon" and ranges from 110-111 in the AM. (last Hbg A1C 6 mths ago)  Works at [employer] and doesn't want to wear blue blood band.  Has never had blood transfusion and he understands another sample will be draw today and DOS.

## 2014-06-05 LAB — HEMOGLOBIN A1C
Hgb A1c MFr Bld: 6.1 % — ABNORMAL HIGH (ref 4.8–5.6)
MEAN PLASMA GLUCOSE: 128 mg/dL

## 2014-06-10 ENCOUNTER — Inpatient Hospital Stay (HOSPITAL_COMMUNITY)
Admission: RE | Admit: 2014-06-10 | Discharge: 2014-06-11 | DRG: 460 | Disposition: A | Discharge status: Home / Self Care | Payer: 59 | Source: Ambulatory Visit | Attending: Neurosurgery | Admitting: Neurosurgery

## 2014-06-10 ENCOUNTER — Inpatient Hospital Stay (HOSPITAL_COMMUNITY): Payer: 59 | Admitting: Certified Registered Nurse Anesthetist

## 2014-06-10 ENCOUNTER — Inpatient Hospital Stay (HOSPITAL_COMMUNITY): Payer: 59 | Admitting: Vascular Surgery

## 2014-06-10 ENCOUNTER — Encounter (HOSPITAL_COMMUNITY): Payer: Self-pay | Admitting: Certified Registered Nurse Anesthetist

## 2014-06-10 ENCOUNTER — Inpatient Hospital Stay (HOSPITAL_COMMUNITY): Payer: 59

## 2014-06-10 ENCOUNTER — Encounter (HOSPITAL_COMMUNITY)
Admission: RE | Disposition: A | Discharge status: Home / Self Care | Payer: Self-pay | Source: Ambulatory Visit | Attending: Neurosurgery

## 2014-06-10 DIAGNOSIS — E119 Type 2 diabetes mellitus without complications: Secondary | ICD-10-CM | POA: Diagnosis present

## 2014-06-10 DIAGNOSIS — M5117 Intervertebral disc disorders with radiculopathy, lumbosacral region: Principal | ICD-10-CM | POA: Diagnosis present

## 2014-06-10 DIAGNOSIS — M4806 Spinal stenosis, lumbar region: Secondary | ICD-10-CM | POA: Diagnosis present

## 2014-06-10 DIAGNOSIS — M5126 Other intervertebral disc displacement, lumbar region: Secondary | ICD-10-CM | POA: Diagnosis present

## 2014-06-10 DIAGNOSIS — I10 Essential (primary) hypertension: Secondary | ICD-10-CM | POA: Diagnosis present

## 2014-06-10 HISTORY — PX: MAXIMUM ACCESS (MAS)POSTERIOR LUMBAR INTERBODY FUSION (PLIF) 1 LEVEL: SHX6368

## 2014-06-10 LAB — TYPE AND SCREEN
ABO/RH(D): AB POS
Antibody Screen: NEGATIVE

## 2014-06-10 LAB — GLUCOSE, CAPILLARY
GLUCOSE-CAPILLARY: 81 mg/dL (ref 65–99)
GLUCOSE-CAPILLARY: 107 mg/dL — AB (ref 65–99)
Glucose-Capillary: 90 mg/dL (ref 65–99)
Glucose-Capillary: 177 mg/dL — ABNORMAL HIGH (ref 65–99)

## 2014-06-10 LAB — ABO/RH: ABO/RH(D): AB POS

## 2014-06-10 SURGERY — FOR MAXIMUM ACCESS (MAS) POSTERIOR LUMBAR INTERBODY FUSION (PLIF) 1 LEVEL
Anesthesia: General | Site: Spine Lumbar

## 2014-06-10 MED ORDER — FENTANYL CITRATE (PF) 250 MCG/5ML IJ SOLN
INTRAMUSCULAR | Status: AC
  Filled 2014-06-10: qty 5

## 2014-06-10 MED ORDER — MENTHOL 3 MG MT LOZG: 1.0000 | LOZENGE | OROMUCOSAL | Status: DC | PRN

## 2014-06-10 MED ORDER — POLYETHYLENE GLYCOL 3350 17 G PO PACK
17.0000 g | PACK | Freq: Every day | ORAL | Status: DC | PRN
Start: 2014-06-10 — End: 2014-06-11
  Filled 2014-06-10: qty 1

## 2014-06-10 MED ORDER — BUPIVACAINE HCL (PF) 0.5 % IJ SOLN
INTRAMUSCULAR | Status: DC | PRN
  Administered 2014-06-10: 5 mL

## 2014-06-10 MED ORDER — LISINOPRIL-HYDROCHLOROTHIAZIDE 10-12.5 MG PO TABS: 1.0000 | ORAL_TABLET | Freq: Every day | ORAL | Status: DC

## 2014-06-10 MED ORDER — CEFAZOLIN SODIUM-DEXTROSE 2-3 GM-% IV SOLR
2.0000 g | Freq: Three times a day (TID) | INTRAVENOUS | Status: AC
  Administered 2014-06-10 – 2014-06-11 (×2): 2 g via INTRAVENOUS
  Filled 2014-06-10 (×2): qty 50

## 2014-06-10 MED ORDER — LACTATED RINGERS IV SOLN
INTRAVENOUS | Status: DC
  Administered 2014-06-10: 12:00:00 via INTRAVENOUS

## 2014-06-10 MED ORDER — ONDANSETRON HCL 4 MG/2ML IJ SOLN
INTRAMUSCULAR | Status: AC
  Filled 2014-06-10: qty 2

## 2014-06-10 MED ORDER — PIOGLITAZONE HCL-METFORMIN HCL 15-500 MG PO TABS: 1.0000 | ORAL_TABLET | Freq: Two times a day (BID) | ORAL | Status: DC

## 2014-06-10 MED ORDER — BISACODYL 10 MG RE SUPP: 10.0000 mg | Freq: Every day | RECTAL | Status: DC | PRN

## 2014-06-10 MED ORDER — ACETAMINOPHEN 650 MG RE SUPP: 650.0000 mg | RECTAL | Status: DC | PRN

## 2014-06-10 MED ORDER — PANTOPRAZOLE SODIUM 40 MG IV SOLR: 40.0000 mg | Freq: Every day | INTRAVENOUS | Status: DC

## 2014-06-10 MED ORDER — METFORMIN HCL 500 MG PO TABS
500.0000 mg | ORAL_TABLET | Freq: Two times a day (BID) | ORAL | Status: DC
  Administered 2014-06-11: 500 mg via ORAL
  Filled 2014-06-10 (×3): qty 1

## 2014-06-10 MED ORDER — PHENYLEPHRINE 40 MCG/ML (10ML) SYRINGE FOR IV PUSH (FOR BLOOD PRESSURE SUPPORT)
PREFILLED_SYRINGE | INTRAVENOUS | Status: AC
  Filled 2014-06-10: qty 10

## 2014-06-10 MED ORDER — METHOCARBAMOL 500 MG PO TABS: 500.0000 mg | ORAL_TABLET | Freq: Two times a day (BID) | ORAL | Status: DC

## 2014-06-10 MED ORDER — FLEET ENEMA 7-19 GM/118ML RE ENEM
1.0000 | ENEMA | Freq: Once | RECTAL | Status: AC | PRN
  Filled 2014-06-10: qty 1

## 2014-06-10 MED ORDER — INSULIN ASPART 100 UNIT/ML ~~LOC~~ SOLN
0.0000 [IU] | Freq: Three times a day (TID) | SUBCUTANEOUS | Status: DC
  Filled 2014-06-10 (×25): qty 0.15

## 2014-06-10 MED ORDER — PROMETHAZINE HCL 25 MG/ML IJ SOLN: 6.2500 mg | INTRAMUSCULAR | Status: DC | PRN

## 2014-06-10 MED ORDER — HYDROCODONE-ACETAMINOPHEN 5-325 MG PO TABS
ORAL_TABLET | ORAL | Status: AC
  Filled 2014-06-10: qty 2

## 2014-06-10 MED ORDER — DEXTROSE 5 % IV SOLN
500.0000 mg | Freq: Four times a day (QID) | INTRAVENOUS | Status: DC | PRN
  Administered 2014-06-10: 500 mg via INTRAVENOUS
  Filled 2014-06-10 (×2): qty 5

## 2014-06-10 MED ORDER — PHENYLEPHRINE HCL 10 MG/ML IJ SOLN
INTRAMUSCULAR | Status: AC
  Filled 2014-06-10: qty 1

## 2014-06-10 MED ORDER — METHOCARBAMOL 500 MG PO TABS
ORAL_TABLET | ORAL | Status: AC
  Filled 2014-06-10: qty 1

## 2014-06-10 MED ORDER — PHENOL 1.4 % MT LIQD: 1.0000 | OROMUCOSAL | Status: DC | PRN

## 2014-06-10 MED ORDER — CEFAZOLIN SODIUM-DEXTROSE 2-3 GM-% IV SOLR
2.0000 g | INTRAVENOUS | Status: AC
  Administered 2014-06-10: 2 g via INTRAVENOUS
  Filled 2014-06-10: qty 50

## 2014-06-10 MED ORDER — SODIUM CHLORIDE 0.9 % IJ SOLN: 3.0000 mL | Freq: Two times a day (BID) | INTRAMUSCULAR | Status: DC

## 2014-06-10 MED ORDER — FENTANYL CITRATE (PF) 100 MCG/2ML IJ SOLN
INTRAMUSCULAR | Status: DC | PRN
  Administered 2014-06-10 (×8): 50 ug via INTRAVENOUS

## 2014-06-10 MED ORDER — HYDROMORPHONE HCL 1 MG/ML IJ SOLN
0.2500 mg | INTRAMUSCULAR | Status: DC | PRN
  Administered 2014-06-10 (×4): 0.5 mg via INTRAVENOUS

## 2014-06-10 MED ORDER — KCL IN DEXTROSE-NACL 20-5-0.45 MEQ/L-%-% IV SOLN
INTRAVENOUS | Status: DC
  Filled 2014-06-10 (×3): qty 1000

## 2014-06-10 MED ORDER — MIDAZOLAM HCL 2 MG/2ML IJ SOLN
INTRAMUSCULAR | Status: AC
Start: 2014-06-10 — End: 2014-06-10
  Filled 2014-06-10: qty 2

## 2014-06-10 MED ORDER — LIDOCAINE HCL (CARDIAC) 20 MG/ML IV SOLN
INTRAVENOUS | Status: AC
  Filled 2014-06-10: qty 5

## 2014-06-10 MED ORDER — LIDOCAINE HCL (CARDIAC) 20 MG/ML IV SOLN
INTRAVENOUS | Status: DC | PRN
  Administered 2014-06-10: 80 mg via INTRAVENOUS

## 2014-06-10 MED ORDER — SODIUM CHLORIDE 0.9 % IV SOLN: 250.0000 mL | INTRAVENOUS | Status: DC

## 2014-06-10 MED ORDER — THROMBIN 20000 UNITS EX SOLR
CUTANEOUS | Status: DC | PRN
  Administered 2014-06-10: 15:00:00 via TOPICAL

## 2014-06-10 MED ORDER — ACETAMINOPHEN 325 MG PO TABS: 650.0000 mg | ORAL_TABLET | ORAL | Status: DC | PRN

## 2014-06-10 MED ORDER — PROPOFOL 10 MG/ML IV BOLUS
INTRAVENOUS | Status: DC | PRN
  Administered 2014-06-10: 30 mg via INTRAVENOUS
  Administered 2014-06-10: 40 mg via INTRAVENOUS
  Administered 2014-06-10: 200 mg via INTRAVENOUS

## 2014-06-10 MED ORDER — INSULIN ASPART 100 UNIT/ML ~~LOC~~ SOLN
0.0000 [IU] | Freq: Every day | SUBCUTANEOUS | Status: DC
  Filled 2014-06-10 (×9): qty 0.05

## 2014-06-10 MED ORDER — DEXTROSE 5 % IV SOLN
10.0000 mg | INTRAVENOUS | Status: DC | PRN
  Administered 2014-06-10: 20 ug/min via INTRAVENOUS

## 2014-06-10 MED ORDER — LORATADINE 10 MG PO TABS
10.0000 mg | ORAL_TABLET | Freq: Every day | ORAL | Status: DC
  Filled 2014-06-10: qty 1

## 2014-06-10 MED ORDER — INSULIN ASPART 100 UNIT/ML ~~LOC~~ SOLN
4.0000 [IU] | Freq: Three times a day (TID) | SUBCUTANEOUS | Status: DC
  Filled 2014-06-10 (×25): qty 0.04

## 2014-06-10 MED ORDER — HYDROMORPHONE HCL 1 MG/ML IJ SOLN
INTRAMUSCULAR | Status: AC
  Filled 2014-06-10: qty 1

## 2014-06-10 MED ORDER — ARTIFICIAL TEARS OP OINT
TOPICAL_OINTMENT | OPHTHALMIC | Status: AC
  Filled 2014-06-10: qty 3.5

## 2014-06-10 MED ORDER — METHOCARBAMOL 500 MG PO TABS
500.0000 mg | ORAL_TABLET | Freq: Four times a day (QID) | ORAL | Status: DC | PRN
  Administered 2014-06-10: 500 mg via ORAL
  Filled 2014-06-10 (×2): qty 1

## 2014-06-10 MED ORDER — PANTOPRAZOLE SODIUM 40 MG PO TBEC
40.0000 mg | DELAYED_RELEASE_TABLET | Freq: Every day | ORAL | Status: DC
  Administered 2014-06-10: 40 mg via ORAL
  Filled 2014-06-10: qty 1

## 2014-06-10 MED ORDER — ATORVASTATIN CALCIUM 40 MG PO TABS
40.0000 mg | ORAL_TABLET | Freq: Every day | ORAL | Status: DC
  Filled 2014-06-10: qty 1

## 2014-06-10 MED ORDER — ONDANSETRON HCL 4 MG/2ML IJ SOLN
INTRAMUSCULAR | Status: DC | PRN
  Administered 2014-06-10: 4 mg via INTRAVENOUS

## 2014-06-10 MED ORDER — LACTATED RINGERS IV SOLN
INTRAVENOUS | Status: DC | PRN
  Administered 2014-06-10 (×2): via INTRAVENOUS

## 2014-06-10 MED ORDER — SUCCINYLCHOLINE CHLORIDE 20 MG/ML IJ SOLN
INTRAMUSCULAR | Status: DC | PRN
  Administered 2014-06-10: 100 mg via INTRAVENOUS

## 2014-06-10 MED ORDER — ONDANSETRON HCL 4 MG/2ML IJ SOLN: 4.0000 mg | INTRAMUSCULAR | Status: DC | PRN

## 2014-06-10 MED ORDER — LISINOPRIL 10 MG PO TABS
10.0000 mg | ORAL_TABLET | Freq: Every day | ORAL | Status: DC
  Filled 2014-06-10: qty 1

## 2014-06-10 MED ORDER — PROPOFOL 10 MG/ML IV BOLUS
INTRAVENOUS | Status: AC
Start: 2014-06-10 — End: 2014-06-10
  Filled 2014-06-10: qty 20

## 2014-06-10 MED ORDER — LIDOCAINE-EPINEPHRINE 1 %-1:100000 IJ SOLN
INTRAMUSCULAR | Status: DC | PRN
  Administered 2014-06-10: 5 mL

## 2014-06-10 MED ORDER — HYDROCODONE-ACETAMINOPHEN 5-325 MG PO TABS
1.0000 | ORAL_TABLET | ORAL | Status: DC | PRN
  Administered 2014-06-10 – 2014-06-11 (×3): 2 via ORAL
  Filled 2014-06-10 (×2): qty 2

## 2014-06-10 MED ORDER — BUPIVACAINE LIPOSOME 1.3 % IJ SUSP
20.0000 mL | INTRAMUSCULAR | Status: DC
  Filled 2014-06-10: qty 20

## 2014-06-10 MED ORDER — ARTIFICIAL TEARS OP OINT
TOPICAL_OINTMENT | OPHTHALMIC | Status: DC | PRN
  Administered 2014-06-10: 1 via OPHTHALMIC

## 2014-06-10 MED ORDER — 0.9 % SODIUM CHLORIDE (POUR BTL) OPTIME
TOPICAL | Status: DC | PRN
  Administered 2014-06-10: 1000 mL

## 2014-06-10 MED ORDER — DOCUSATE SODIUM 100 MG PO CAPS
100.0000 mg | ORAL_CAPSULE | Freq: Two times a day (BID) | ORAL | Status: DC
  Administered 2014-06-10: 100 mg via ORAL
  Filled 2014-06-10: qty 1

## 2014-06-10 MED ORDER — HYDROCHLOROTHIAZIDE 12.5 MG PO CAPS
12.5000 mg | ORAL_CAPSULE | Freq: Every day | ORAL | Status: DC
  Filled 2014-06-10: qty 1

## 2014-06-10 MED ORDER — ZOLPIDEM TARTRATE 5 MG PO TABS: 5.0000 mg | ORAL_TABLET | Freq: Every evening | ORAL | Status: DC | PRN

## 2014-06-10 MED ORDER — PHENYLEPHRINE HCL 10 MG/ML IJ SOLN
INTRAMUSCULAR | Status: DC | PRN
  Administered 2014-06-10 (×2): 40 ug via INTRAVENOUS
  Administered 2014-06-10: 80 ug via INTRAVENOUS
  Administered 2014-06-10 (×5): 40 ug via INTRAVENOUS

## 2014-06-10 MED ORDER — HYDROMORPHONE HCL 1 MG/ML IJ SOLN
0.5000 mg | INTRAMUSCULAR | Status: DC | PRN
  Administered 2014-06-10: 1 mg via INTRAVENOUS
  Filled 2014-06-10: qty 1

## 2014-06-10 MED ORDER — MIDAZOLAM HCL 5 MG/5ML IJ SOLN
INTRAMUSCULAR | Status: DC | PRN
  Administered 2014-06-10: 2 mg via INTRAVENOUS

## 2014-06-10 MED ORDER — ASPIRIN EC 81 MG PO TBEC
81.0000 mg | DELAYED_RELEASE_TABLET | Freq: Every day | ORAL | Status: DC
  Filled 2014-06-10: qty 1

## 2014-06-10 MED ORDER — ALUM & MAG HYDROXIDE-SIMETH 200-200-20 MG/5ML PO SUSP: 30.0000 mL | Freq: Four times a day (QID) | ORAL | Status: DC | PRN

## 2014-06-10 MED ORDER — SODIUM CHLORIDE 0.9 % IJ SOLN: 3.0000 mL | INTRAMUSCULAR | Status: DC | PRN

## 2014-06-10 MED ORDER — PIOGLITAZONE HCL 15 MG PO TABS
15.0000 mg | ORAL_TABLET | Freq: Two times a day (BID) | ORAL | Status: DC
  Administered 2014-06-11: 15 mg via ORAL
  Filled 2014-06-10 (×3): qty 1

## 2014-06-10 SURGICAL SUPPLY — 74 items
BENZOIN TINCTURE PRP APPL 2/3 (GAUZE/BANDAGES/DRESSINGS) IMPLANT
BLADE CLIPPER SURG (BLADE) ×2 IMPLANT
BONE MATRIX OSTEOCEL PRO MED (Bone Implant) ×4 IMPLANT
BUR MATCHSTICK NEURO 3.0 LAGG (BURR) ×2 IMPLANT
BUR ROUND FLUTED 5 RND (BURR) ×2 IMPLANT
CAGE COROENT LG 10X9X23-12 (Cage) ×4 IMPLANT
CANISTER SUCT 3000ML PPV (MISCELLANEOUS) ×2 IMPLANT
CLIP NEUROVISION LG (CLIP) ×2 IMPLANT
CONT SPEC 4OZ CLIKSEAL STRL BL (MISCELLANEOUS) ×4 IMPLANT
COVER BACK TABLE 24X17X13 BIG (DRAPES) IMPLANT
COVER BACK TABLE 60X90IN (DRAPES) ×2 IMPLANT
DECANTER SPIKE VIAL GLASS SM (MISCELLANEOUS) ×2 IMPLANT
DERMABOND ADVANCED (GAUZE/BANDAGES/DRESSINGS) ×1
DERMABOND ADVANCED .7 DNX12 (GAUZE/BANDAGES/DRESSINGS) ×1 IMPLANT
DRAPE C-ARM 42X72 X-RAY (DRAPES) ×4 IMPLANT
DRAPE C-ARMOR (DRAPES) ×2 IMPLANT
DRAPE LAPAROTOMY 100X72X124 (DRAPES) ×2 IMPLANT
DRAPE POUCH INSTRU U-SHP 10X18 (DRAPES) ×2 IMPLANT
DRAPE SURG 17X23 STRL (DRAPES) ×2 IMPLANT
DRSG OPSITE POSTOP 4X6 (GAUZE/BANDAGES/DRESSINGS) ×2 IMPLANT
DRSG TELFA 3X8 NADH (GAUZE/BANDAGES/DRESSINGS) ×2 IMPLANT
DURAPREP 26ML APPLICATOR (WOUND CARE) ×2 IMPLANT
ELECT REM PT RETURN 9FT ADLT (ELECTROSURGICAL) ×2
ELECTRODE REM PT RTRN 9FT ADLT (ELECTROSURGICAL) ×1 IMPLANT
EVACUATOR 1/8 PVC DRAIN (DRAIN) IMPLANT
GAUZE SPONGE 4X4 12PLY STRL (GAUZE/BANDAGES/DRESSINGS) ×2 IMPLANT
GAUZE SPONGE 4X4 16PLY XRAY LF (GAUZE/BANDAGES/DRESSINGS) IMPLANT
GLOVE BIO SURGEON STRL SZ8 (GLOVE) ×4 IMPLANT
GLOVE BIOGEL PI IND STRL 8 (GLOVE) ×2 IMPLANT
GLOVE BIOGEL PI IND STRL 8.5 (GLOVE) ×2 IMPLANT
GLOVE BIOGEL PI INDICATOR 8 (GLOVE) ×2
GLOVE BIOGEL PI INDICATOR 8.5 (GLOVE) ×2
GLOVE ECLIPSE 8.0 STRL XLNG CF (GLOVE) ×4 IMPLANT
GOWN STRL REUS W/ TWL LRG LVL3 (GOWN DISPOSABLE) ×1 IMPLANT
GOWN STRL REUS W/ TWL XL LVL3 (GOWN DISPOSABLE) ×2 IMPLANT
GOWN STRL REUS W/TWL 2XL LVL3 (GOWN DISPOSABLE) ×4 IMPLANT
GOWN STRL REUS W/TWL LRG LVL3 (GOWN DISPOSABLE) ×1
GOWN STRL REUS W/TWL XL LVL3 (GOWN DISPOSABLE) ×2
KIT BASIN OR (CUSTOM PROCEDURE TRAY) ×2 IMPLANT
KIT NEEDLE NVM5 EMG ELECT (KITS) ×1 IMPLANT
KIT NEEDLE NVM5 EMG ELECTRODE (KITS) ×1
KIT POSITION SURG JACKSON T1 (MISCELLANEOUS) ×2 IMPLANT
KIT ROOM TURNOVER OR (KITS) ×2 IMPLANT
MILL MEDIUM DISP (BLADE) ×4 IMPLANT
NEEDLE HYPO 25X1 1.5 SAFETY (NEEDLE) ×4 IMPLANT
NEEDLE SPNL 18GX3.5 QUINCKE PK (NEEDLE) IMPLANT
NS IRRIG 1000ML POUR BTL (IV SOLUTION) ×2 IMPLANT
PACK LAMINECTOMY NEURO (CUSTOM PROCEDURE TRAY) ×2 IMPLANT
PAD ARMBOARD 7.5X6 YLW CONV (MISCELLANEOUS) ×6 IMPLANT
PATTIES SURGICAL .5 X.5 (GAUZE/BANDAGES/DRESSINGS) IMPLANT
PATTIES SURGICAL .5 X1 (DISPOSABLE) IMPLANT
PATTIES SURGICAL 1X1 (DISPOSABLE) IMPLANT
ROD 35MM (Rod) ×4 IMPLANT
SCREW LOCK (Screw) ×4 IMPLANT
SCREW LOCK FXNS SPNE MAS PL (Screw) ×4 IMPLANT
SCREW SHANK 6.5X65 (Screw) ×4 IMPLANT
SCREW SHANKS 5.5X35 (Screw) ×4 IMPLANT
SCREW TULIP 5.5 (Screw) ×8 IMPLANT
SPONGE LAP 4X18 X RAY DECT (DISPOSABLE) IMPLANT
SPONGE SURGIFOAM ABS GEL 100 (HEMOSTASIS) ×2 IMPLANT
STAPLER SKIN PROX WIDE 3.9 (STAPLE) ×2 IMPLANT
STRIP CLOSURE SKIN 1/2X4 (GAUZE/BANDAGES/DRESSINGS) IMPLANT
SUT VIC AB 1 CT1 18XBRD ANBCTR (SUTURE) ×2 IMPLANT
SUT VIC AB 1 CT1 8-18 (SUTURE) ×2
SUT VIC AB 2-0 CT1 18 (SUTURE) ×4 IMPLANT
SUT VIC AB 3-0 SH 8-18 (SUTURE) ×4 IMPLANT
SYR 20CC LL (SYRINGE) ×2 IMPLANT
SYR 20ML ECCENTRIC (SYRINGE) ×2 IMPLANT
SYR 5ML LL (SYRINGE) IMPLANT
TOWEL OR 17X24 6PK STRL BLUE (TOWEL DISPOSABLE) ×2 IMPLANT
TOWEL OR 17X26 10 PK STRL BLUE (TOWEL DISPOSABLE) ×2 IMPLANT
TRAP SPECIMEN MUCOUS 40CC (MISCELLANEOUS) ×2 IMPLANT
TRAY FOLEY W/METER SILVER 14FR (SET/KITS/TRAYS/PACK) ×2 IMPLANT
WATER STERILE IRR 1000ML POUR (IV SOLUTION) ×2 IMPLANT

## 2014-06-10 NOTE — Transfer of Care (Signed)
Immediate Anesthesia Transfer of Care Note  Patient: Cameron Cardenas  Procedure(s) Performed: Procedure(s) with comments: Redo Lumbar Five-Sacral One Decompression with maximum access posterior lumbar interbody fusion (N/A) - Redo Decompression with maximum access posterior lumbar interbody fusion, L5-S1  Patient Location: PACU  Anesthesia Type:General  Level of Consciousness: awake, alert  and oriented  Airway & Oxygen Therapy: Patient Spontanous Breathing and Patient connected to nasal cannula oxygen  Post-op Assessment: Report given to RN, Post -op Vital signs reviewed and stable and Patient moving all extremities X 4  Post vital signs: Reviewed and stable  Last Vitals: There were no vitals filed for this visit.  Complications: No apparent anesthesia complications

## 2014-06-10 NOTE — Interval H&P Note (Signed)
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 After consideration of risks, benefits and other options for treatment, the patient has consented to  Procedure(s) with comments: Redo Decompression with maximum access posterior lumbar interbody fusion, L5-S1 (N/A) - Redo Decompression with maximum access posterior lumbar interbody fusion, L5-S1 as a surgical intervention .  The patient's history has been reviewed, patient examined, no change in status, stable for surgery.  I have reviewed the patient's chart and labs.  Questions were answered to the patient's satisfaction.     [name]

## 2014-06-10 NOTE — Anesthesia Procedure Notes (Signed)
Procedure Name: Intubation Date/Time: 06/10/2014 1:49 PM Performed by: Garrison Columbus T Pre-anesthesia Checklist: Patient identified, Emergency Drugs available, Suction available and Patient being monitored Patient Re-evaluated:Patient Re-evaluated prior to inductionOxygen Delivery Method: Circle system utilized Preoxygenation: Pre-oxygenation with 100% oxygen Intubation Type: IV induction Ventilation: Mask ventilation without difficulty Laryngoscope Size: Miller and 2 Grade View: Grade I Tube type: Oral Tube size: 7.5 mm Number of attempts: 1 Airway Equipment and Method: Stylet Placement Confirmation: ETT inserted through vocal cords under direct vision,  positive ETCO2 and breath sounds checked- equal and bilateral Secured at: 23 cm Tube secured with: Tape Dental Injury: Teeth and Oropharynx as per pre-operative assessment

## 2014-06-10 NOTE — Anesthesia Preprocedure Evaluation (Addendum)
Anesthesia Evaluation  Patient identified by MRN, date of birth, ID band Patient awake    Reviewed: Allergy & Precautions, NPO status , Patient's Chart, lab work & pertinent test results  Airway Mallampati: II  TM Distance: >3 FB Neck ROM: Full    Dental no notable dental hx.    Pulmonary neg pulmonary ROS, former smoker,  breath sounds clear to auscultation  Pulmonary exam normal       Cardiovascular hypertension, Normal cardiovascular examRhythm:Regular Rate:Normal     Neuro/Psych RLE pain from disc disease negative psych ROS   GI/Hepatic negative GI ROS, Neg liver ROS,   Endo/Other  diabetes  Renal/GU negative Renal ROS  negative genitourinary   Musculoskeletal negative musculoskeletal ROS (+)   Abdominal   Peds negative pediatric ROS (+)  Hematology negative hematology ROS (+)   Anesthesia Other Findings   Reproductive/Obstetrics negative OB ROS                            Anesthesia Physical Anesthesia Plan  ASA: II  Anesthesia Plan: General   Post-op Pain Management:    Induction: Intravenous  Airway Management Planned: Oral ETT  Additional Equipment:   Intra-op Plan:   Post-operative Plan: Extubation in OR  Informed Consent: I have reviewed the patients History and Physical, chart, labs and discussed the procedure including the risks, benefits and alternatives for the proposed anesthesia with the patient or authorized representative who has indicated his/her understanding and acceptance.   Dental advisory given  Plan Discussed with: CRNA and Surgeon  Anesthesia Plan Comments:         Anesthesia Quick Evaluation

## 2014-06-10 NOTE — H&P (Signed)
Patient ID:   [id number] Patient: Cameron Cardenas  Date of Birth: December 23, 1955 Visit Type: Office Visit   Date: 05/27/2014 03:00 PM Provider: Marchia Meiers. Vertell Limber MD   This 59 year old male presents for Hip pain and Leg pain.  History of Present Illness: 1.  Hip pain  2.  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  He recalls no injury stating only that he woke up and pain three months ago.  History: HTN, NIDDM Surgical history: Right L5-S1 microdiscectomy 1983, with redo by Dr. Vertell Limber 03/30/10  ESI offered no relief  Ibuprofen 200 mg every 4 hours offers little help Percocet was not tolerated, causing him to wander in the house during the night.  Patient flushed the pills down the toilet  MRI on [place]  I reviewed the patient's MRI which shows that he has had a previous wide laminectomy on the right with partial facetectomy on the right at the L5-S1 level.  He has a new recurrent disc herniation at this level.  He has significant spondylosis at this level.  The patient complains of 8 out of 10 pain in his right hip and right foot.  He is not able to get any relief.  This is been going on for the last 3 months.        PAST MEDICAL/SURGICAL HISTORY   (Detailed)  Disease/disorder Onset Date Management Date Comments    Surgery, lumbar spine 2011     Surgery, lumbar spine 1983   Diabetes type 2      Hypertension         PAST MEDICAL HISTORY, SURGICAL HISTORY, FAMILY HISTORY, SOCIAL HISTORY AND REVIEW OF SYSTEMS I have reviewed the patient's past medical, surgical, family and social history as well as the comprehensive review of systems as included on the [facility] history form dated 05/27/2014, which I have signed.  Family History  (Detailed) Relationship Family Member Name Deceased Age at Death Condition Onset Age Cause of Death      Family history of  Hypertension  N      Family history of Diabetes mellitus  N    SOCIAL HISTORY  (Detailed) Tobacco use reviewed. Preferred language is Unknown.   Smoking status: Never smoker.  SMOKING STATUS Use Status Type Smoking Status Usage Per Day Years Used Total Pack Years  no/never  Never smoker       HOME ENVIRONMENT/SAFETY The patient has not fallen in the last year.        MEDICATIONS(added, continued or stopped this visit): Started Medication Directions Instruction Stopped   aspirin 81 mg chewable tablet chew 1 tablet by oral route  every day     atorvastatin 40 mg tablet take 1 tablet by oral route  every day     lisinopril 10 mg-hydrochlorothiazide 12.5 mg tablet take 1 tablet by oral route  every day    05/27/2014 Norco 10 mg-325 mg tablet take 1 tablet by oral route  every 6 hours as needed for pain     pioglitazone 15 mg-metformin 500 mg tablet take 1 tablet by oral route  every day     Zyrtec 10 mg tablet take 1 tablet by oral route  every day       ALLERGIES: Ingredient Reaction Medication Name Comment  NO KNOWN ALLERGIES     No known allergies.   REVIEW OF SYSTEMS System Neg/Pos Details  Constitutional Negative Chills, fatigue, fever, malaise, night sweats, weight gain and weight loss.  ENMT Negative Ear drainage, hearing loss, nasal drainage, otalgia, sinus pressure and sore throat.  Eyes Negative Eye discharge, eye pain and vision changes.  Respiratory Negative Chronic cough, cough, dyspnea, known TB exposure and wheezing.  Cardio Negative Chest pain, claudication, edema and irregular heartbeat/palpitations.  GI Negative Abdominal pain, blood in stool, change in stool pattern, constipation, decreased appetite, diarrhea, heartburn, nausea and vomiting.  GU Negative Dribbling, dysuria, erectile dysfunction, hematuria, polyuria, slow stream, urinary frequency, urinary incontinence and urinary retention.  Endocrine Negative Cold intolerance, heat intolerance,  polydipsia and polyphagia.  Neuro Positive Numbness in extremity.  Psych Negative Anxiety, depression and insomnia.  Integumentary Negative Brittle hair, brittle nails, change in shape/size of mole(s), hair loss, hirsutism, hives, pruritus, rash and skin lesion.  MS Positive RLE pain.  Hema/Lymph Negative Easy bleeding, easy bruising and lymphadenopathy.  Allergic/Immuno Negative Contact allergy, environmental allergies, food allergies and seasonal allergies.  Reproductive Negative Penile discharge and sexual dysfunction.     Vitals Date Temp F BP Pulse Ht In Wt Lb BMI BSA Pain Score  05/27/2014  117/80 69 76 204 24.83  8/10     PHYSICAL EXAM General Level of Distress: no acute distress Overall Appearance: normal    Cardiovascular Cardiac: regular rate and rhythm without murmur  Respiratory Lungs: clear to auscultation  Neurological Recent and Remote Memory: normal Attention Span and Concentration:   normal Language: normal Fund of Knowledge: normal  Right Left Sensation: normal normal Upper Extremity Coordination: normal normal  Lower Extremity Coordination: normal normal  Musculoskeletal Gait and Station: normal  Right Left Upper Extremity Muscle Strength: normal normal Lower Extremity Muscle Strength: normal normal Upper Extremity Muscle Tone:  normal normal Lower Extremity Muscle Tone: normal normal  Motor Strength Upper and lower extremity motor strength was tested in the clinically pertinent muscles. Any abnormal findings will be noted below.   Right Left Medial Gastroc: 4/5    Deep Tendon Reflexes  Right Left Biceps: normal normal Triceps: normal normal Brachiloradialis: normal normal Patellar: normal normal Achilles: absent normal  Sensory Sensation was tested at L1 to S1.   Cranial Nerves II. Optic Nerve/Visual Fields: normal III. Oculomotor: normal IV. Trochlear: normal V. Trigeminal: normal VI. Abducens: normal VII.  Facial: normal VIII. Acoustic/Vestibular: normal IX. Glossopharyngeal: normal X. Vagus: normal XI. Spinal Accessory: normal XII. Hypoglossal: normal  Motor and other Tests Lhermittes: negative Rhomberg: negative    Right Left Hoffman's: normal normal Clonus: normal normal Babinski: normal normal SLR: positive at 20 degrees negative Patrick's Corky Sox): negative negative Toe Walk: normal normal Toe Lift: normal normal Heel Walk: normal normal SI Joint: nontender nontender   Additional Findings:  Patient has an antalgic gait and has difficulty standing on his right leg.  He is limited in forward bending.  He is not able to stand on his toes on the right.    IMPRESSION Because of the patient's large recurrent disc herniation along with significant spondylosis and wide laminectomy in the past along with this being the third recurrent disc rupture, I've recommended that he undergo L5-S1 MAS PLIF procedure.  The patient is in severe pain.  I've given her a prescription for hydrocodone 10/325 for pain management.  Assessment/Plan # Detail Type Description   1. 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       2. Assessment Herniated nucleus pulposus, L5-S1 (M51.27).       3. 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       4. Assessment Lumbar radiculopathy (M54.16).         Pain Assessment/Treatment Pain Scale: 8/10. Method: Numeric Pain Intensity Scale. Location: hip. Onset: 02/26/2014. Duration: varies. Quality: discomforting. Pain Assessment/Treatment follow-up plan of care: Patient is taking over the counter pain relievers for relief..  Fall Risk Plan The patient has not fallen in the last year.  Risks and benefits were discussed in detail with patient and he wishes to proceed with surgery.  This will be done in an expedited basis.  Orders: Diagnostic Procedures: Assessment Procedure  M54.16 Lumbar Spine-  AP/Lat  M54.16 Redo decompression, L5-S1 MAS PLIF    MEDICATIONS PRESCRIBED TODAY    Rx Quantity Refills  NORCO 10 mg-325 mg  60 0            Provider:  Marchia Meiers. Vertell Limber MD  05/27/2014 05:46 PM Dictation edited by: Marchia Meiers. Vertell Limber    CC Providers: [place] [address] [place], [place] [id number]              Electronically signed by Marchia Meiers Vertell Limber MD on 05/27/2014 05:46 PM

## 2014-06-10 NOTE — Progress Notes (Signed)
Awake, alert, conversant.  MAEW with good strength.  Doing well. 

## 2014-06-10 NOTE — Brief Op Note (Signed)
06/10/2014  4:33 PM  PATIENT:  Cameron Cardenas  59 y.o. 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  Decompression greater than for standard PLIF procedure.  SURGEON:  Surgeon(s) and Role:    * Erline Levine, MD - Primary    * Earnie Larsson, MD - Assisting  PHYSICIAN ASSISTANT:   ASSISTANTS: Poteat, RN   ANESTHESIA:   general  EBL:  Total I/O In: 1500 [I.V.:1500] Out: 200 [Blood:200]  BLOOD ADMINISTERED:none  DRAINS: none   LOCAL MEDICATIONS USED:  MARCAINE     SPECIMEN:  No Specimen  DISPOSITION OF SPECIMEN:  N/A  COUNTS:  YES  TOURNIQUET:  * No tourniquets in log *  DICTATION: Patient is a 59 year old with spondylosis , stenosis, recurrent disc herniation  and severe back and right lower extremity pain at L 5 S 1 level of the lumbar spine. It was elected to take him to surgery for recurrent discectomy with MASPLIF L 5 S 1 level with posterolateral arthrodesis.  Procedure:   Following uncomplicated induction of GETA, and placement of electrodes for neural monitoring, patient was turned into a prone position on the [place] tableand using AP  fluoroscopy the area of planned incision was marked, prepped with betadine scrub and Duraprep, then draped. Exposure was performed of facet joint complex at L 5 S 1 level and the MAS retractor was placed.5.5 x 35 mm cortical  Nuvasive screws were placed at L 5 bilaterally according to standard landmarks using neural monitoring.  A total laminectomy of L 5 was then performed with disarticulation of facets and exposing the previous laminectomy defect.  This bone was saved for grafting, combined with Osteocel after being run through bone mill and was placed in bone packing device.  Thorough discectomy was performed bilaterally at L 5 S 1 and the endplates were prepared for grafting.  This involved microdiscectomy on the right with removal of multiple fragments of herniated disc material with careful dissection of scar tissue and removal of significant nerve root compressive material.  Decompression was greater than for standard PLIF procedure with painstaking removal of scar tissue and complete decompression of all neural elements. . 23 x 10 x 12 degree cages were placed in the interspace and positioning was confirmed with AP and lateral fluoroscopy.  10 cc of autograft/Osteocel was packed in the interspace medial to the second cage.   Remaining screws were placed at S 1 (6.0 x 35 mm) and 35 mm rods were placed.   And the screws were locked and torqued.Final Xrays showed well positioned implants and screw fixation. The posterolateral region was packed with remaining 10 cc of allograft and autograft on the right of midline. The wounds were irrigated and then closed with 1, 2-0 and 3-0 Vicryl stitches. Sterile occlusive dressing was placed with Dermabond and an occlusive dressing. The patient was then extubated in the operating room and taken to recovery in stable and satisfactory condition having tolerated her operation well. Counts were correct at the end of the case.  PLAN OF CARE: Admit to inpatient   PATIENT DISPOSITION:  PACU - hemodynamically stable.   Delay start of Pharmacological VTE agent (>24hrs) due to surgical blood loss or risk of bleeding: yes

## 2014-06-10 NOTE — Op Note (Signed)
06/10/2014  4:33 PM  PATIENT:  Cameron Cardenas  59 y.o. 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  Decompression greater than for standard PLIF procedure.  SURGEON:  Surgeon(s) and Role:    * Erline Levine, MD - Primary    * Earnie Larsson, MD - Assisting  PHYSICIAN ASSISTANT:   ASSISTANTS: Poteat, RN   ANESTHESIA:   general  EBL:  Total I/O In: 1500 [I.V.:1500] Out: 200 [Blood:200]  BLOOD ADMINISTERED:none  DRAINS: none   LOCAL MEDICATIONS USED:  MARCAINE     SPECIMEN:  No Specimen  DISPOSITION OF SPECIMEN:  N/A  COUNTS:  YES  TOURNIQUET:  * No tourniquets in log *  DICTATION: Patient is a 59 year old with spondylosis , stenosis, recurrent disc herniation  and severe back and right lower extremity pain at L 5 S 1 level of the lumbar spine. It was elected to take him to surgery for recurrent discectomy with MASPLIF L 5 S 1 level with posterolateral arthrodesis.  Procedure:   Following uncomplicated induction of GETA, and placement of electrodes for neural monitoring, patient was turned into a prone position on the [place] tableand using AP  fluoroscopy the area of planned incision was marked, prepped with betadine scrub and Duraprep, then draped. Exposure was performed of facet joint complex at L 5 S 1 level and the MAS retractor was placed.5.5 x 35 mm cortical  Nuvasive screws were placed at L 5 bilaterally according to standard landmarks using neural monitoring.  A total laminectomy of L 5 was then performed with disarticulation of facets and exposing the previous laminectomy defect.  This bone was saved for grafting, combined with Osteocel after being run through bone mill and was placed in bone packing device.  Thorough discectomy was performed bilaterally at L 5 S 1 and the endplates were prepared for grafting.  This involved microdiscectomy on the right with removal of multiple fragments of herniated disc material with careful dissection of scar tissue and removal of significant nerve root compressive material.  Decompression was greater than for standard PLIF procedure with painstaking removal of scar tissue and complete decompression of all neural elements. . 23 x 10 x 12 degree cages were placed in the interspace and positioning was confirmed with AP and lateral fluoroscopy.  10 cc of autograft/Osteocel was packed in the interspace medial to the second cage.   Remaining screws were placed at S 1 (6.0 x 35 mm) and 35 mm rods were placed.   And the screws were locked and torqued.Final Xrays showed well positioned implants and screw fixation. The posterolateral region was packed with remaining 10 cc of allograft and autograft on the right of midline. The wounds were irrigated and then closed with 1, 2-0 and 3-0 Vicryl stitches. Sterile occlusive dressing was placed with Dermabond and an occlusive dressing. The patient was then extubated in the operating room and taken to recovery in stable and satisfactory condition having tolerated her operation well. Counts were correct at the end of the case.  PLAN OF CARE: Admit to inpatient   PATIENT DISPOSITION:  PACU - hemodynamically stable.   Delay start of Pharmacological VTE agent (>24hrs) due to surgical blood loss or risk of bleeding: yes

## 2014-06-11 ENCOUNTER — Encounter (HOSPITAL_COMMUNITY): Payer: Self-pay | Admitting: Neurosurgery

## 2014-06-11 LAB — GLUCOSE, CAPILLARY: GLUCOSE-CAPILLARY: 111 mg/dL — AB (ref 65–99)

## 2014-06-11 MED ORDER — HYDROCODONE-ACETAMINOPHEN 5-325 MG PO TABS: 1.0000 | ORAL_TABLET | ORAL | Status: DC | PRN

## 2014-06-11 MED ORDER — METHOCARBAMOL 500 MG PO TABS: 500.0000 mg | ORAL_TABLET | Freq: Four times a day (QID) | ORAL | Status: DC | PRN

## 2014-06-11 MED ORDER — HYDROCODONE-ACETAMINOPHEN 10-325 MG PO TABS: 1.0000 | ORAL_TABLET | ORAL | Status: DC | PRN

## 2014-06-11 NOTE — Discharge Summary (Signed)
Physician Discharge Summary  Patient ID: Cameron Cardenas MRN: [id number] DOB/AGE: Dec 16, 1955 59 y.o.  Admit date: 06/10/2014 Discharge date: 06/11/2014  Admission Diagnoses:Recurrent herniated lumbar disc L 5 S 1 with lumbar instability  Discharge Diagnoses: Same Active Problems:   Herniated lumbar intervertebral disc   Discharged Condition: good  Hospital Course: Patient underwent redo discectomy and fusion L 5 S 1 level, from which he did well.  He was discarged home the morning of POD 1.  Consults: None  Significant Diagnostic Studies: None  Treatments: surgery: redo discectomy and fusion L 5 S 1 level  Discharge Exam: Blood pressure 95/55, pulse 77, temperature 98.5 F (36.9 C), temperature source Oral, resp. rate 18, SpO2 100 %. Neurologic: Alert and oriented X 3, normal strength and tone. Normal symmetric reflexes. Normal coordination and gait Wound:CDI  Disposition: Home     Medication List    TAKE these medications        aspirin 81 MG tablet  Take 81 mg by mouth daily.     atorvastatin 40 MG tablet  Commonly known as:  LIPITOR  Take 1 tablet (40 mg total) by mouth daily.     cetirizine 10 MG tablet  Commonly known as:  ZYRTEC  Take 10 mg by mouth daily.     HYDROcodone-acetaminophen 5-325 MG per tablet  Commonly known as:  NORCO/VICODIN  Take 1-2 tablets by mouth every 4 (four) hours as needed (mild pain).     HYDROcodone-acetaminophen 10-325 MG per tablet  Commonly known as:  NORCO  Take 1-2 tablets by mouth every 4 (four) hours as needed for moderate pain or severe pain.     lisinopril-hydrochlorothiazide 10-12.5 MG per tablet  Commonly known as:  PRINZIDE,ZESTORETIC  Take 1 tablet by mouth daily.     methocarbamol 500 MG tablet  Commonly known as:  ROBAXIN  Take 1 tablet (500 mg total) by mouth 2 (two) times daily.     methocarbamol 500 MG tablet  Commonly known as:  ROBAXIN  Take 1 tablet (500 mg total) by mouth every 6 (six) hours as  needed for muscle spasms.     pioglitazone-metformin 15-500 MG per tablet  Commonly known as:  ACTOPLUS MET  TAKE ONE TABLET BY MOUTH TWICE DAILY         Signed: Peggyann Shoals, MD 06/11/2014, 8:04 AM

## 2014-06-11 NOTE — Progress Notes (Signed)
Subjective: Patient reports feeling much better  Objective: Vital signs in last 24 hours: Temp:  [97.5 F (36.4 C)-98.8 F (37.1 C)] 98.3 F (36.8 C) (06/01 0400) Pulse Rate:  [76-93] 79 (06/01 0400) Resp:  [10-24] 18 (06/01 0400) BP: (100-130)/(58-89) 100/65 mmHg (06/01 0400) SpO2:  [96 %-100 %] 100 % (06/01 0400)  Intake/Output from previous day: 05/31 0701 - 06/01 0700 In: 1500 [I.V.:1500] Out: 200 [Blood:200] Intake/Output this shift:    Physical Exam: Full strength, leg pain resolved  Lab Results: No results for input(s): WBC, HGB, HCT, PLT in the last 72 hours. BMET No results for input(s): NA, K, CL, CO2, GLUCOSE, BUN, CREATININE, CALCIUM in the last 72 hours.  Studies/Results: Dg Lumbar Spine 2-3 Views  06/10/2014   CLINICAL DATA:  L5-S1 PLIF for disc herniation.  EXAM: OPERATIVE LUMBAR SPINE - 2-3 VIEW  COMPARISON:  Lumbar spine MRI 04/24/2014.  FINDINGS: For the purposes of this dictation, the same numbering scheme will be used as on the MRI, with L5 representing the last lumbar segment.  AP and lateral spot images obtained during the L5-S1 PLIF demonstrate bilateral pedicle screws at L5 and S1. Interbody fusion plugs are present in the L5-S1 disc space and are appropriately positioned.  The radiologic technologist documented 51 sec of fluoroscopy time during the surgery.  IMPRESSION: Images obtained during L5-S1 PLIF demonstrating bilateral pedicle screws at L5 and S1 and interbody fusion plugs in the L5-S1 disc space which are appropriately positioned.   Electronically Signed   By: Evangeline Dakin M.D.   On: 06/10/2014 16:19   Dg C-arm 1-60 Min  06/10/2014   CLINICAL DATA:  L5-S1 PLIF for disc herniation.  EXAM: OPERATIVE LUMBAR SPINE - 2-3 VIEW  COMPARISON:  Lumbar spine MRI 04/24/2014.  FINDINGS: For the purposes of this dictation, the same numbering scheme will be used as on the MRI, with L5 representing the last lumbar segment.  AP and lateral spot images obtained  during the L5-S1 PLIF demonstrate bilateral pedicle screws at L5 and S1. Interbody fusion plugs are present in the L5-S1 disc space and are appropriately positioned.  The radiologic technologist documented 51 sec of fluoroscopy time during the surgery.  IMPRESSION: Images obtained during L5-S1 PLIF demonstrating bilateral pedicle screws at L5 and S1 and interbody fusion plugs in the L5-S1 disc space which are appropriately positioned.   Electronically Signed   By: Evangeline Dakin M.D.   On: 06/10/2014 16:19    Assessment/Plan: Doing well.  Discharge home.    LOS: 1 day    Peggyann Shoals, MD 06/11/2014, 8:03 AM

## 2014-06-11 NOTE — Evaluation (Signed)
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 Ready for DC.    Follow Up Recommendations No PT follow up    Equipment Recommendations  None recommended by PT    Recommendations for Other Services       Precautions / Restrictions Precautions Precautions: Back Precaution Booklet Issued: Yes (comment) Precaution Comments: reviewed no BAT Required Braces or Orthoses: Spinal Brace Spinal Brace: Applied in sitting position;Lumbar corset Restrictions Weight Bearing Restrictions: No      Mobility  Bed Mobility Overal bed mobility: Modified Independent             General bed mobility comments: cues for back precautions  Transfers Overall transfer level: Needs assistance Equipment used: None Transfers: Sit to/from Stand Sit to Stand: Supervision         General transfer comment: cues for safet, guarding in back  Ambulation/Gait Ambulation/Gait assistance: Supervision Ambulation Distance (Feet): 200 Feet Assistive device: None Gait Pattern/deviations: Step-through pattern;Decreased stride length Gait velocity: slow   General Gait Details: walks slowly, knees flexed,cues for paoture  Stairs            Wheelchair Mobility    Modified Rankin (Stroke Patients Only)       Balance                                             Pertinent Vitals/Pain Pain Assessment: 0-10 Pain Score: 3  Pain Descriptors / Indicators: Discomfort Pain Intervention(s): Premedicated before session    Home Living Family/patient expects to be discharged to:: Private residence Living Arrangements: Spouse/significant other Available Help at Discharge: Family Type of Home: House Home Access: Level entry     Home Layout:  One level Home Equipment: None      Prior Function Level of Independence: Independent               Hand Dominance        Extremity/Trunk Assessment               Lower Extremity Assessment: RLE deficits/detail;LLE deficits/detail RLE Deficits / Details: walks with knees flexed LLE Deficits / Details: same as R  Cervical / Trunk Assessment: Other exceptions  Communication   Communication: No difficulties  Cognition Arousal/Alertness: Awake/alert Behavior During Therapy: WFL for tasks assessed/performed Overall Cognitive Status: Within Functional Limits for tasks assessed                      General Comments      Exercises        Assessment/Plan    PT Assessment Patent does not need any further PT services  PT Diagnosis Difficulty walking   PT Problem List    PT Treatment Interventions     PT Goals (Current goals can be found in the Care Plan section) Acute Rehab PT Goals PT Goal Formulation: All assessment and education complete, DC therapy    Frequency     Barriers to discharge        Co-evaluation               End of Session Equipment Utilized During Treatment: Back brace Activity Tolerance: Patient tolerated treatment well Patient  left:  (on EOB) Nurse Communication: Mobility status         Time: 1655-3748 PT Time Calculation (min) (ACUTE ONLY): 34 min   Charges:   PT Evaluation $Initial PT Evaluation Tier I: 1 Procedure PT Treatments $Gait Training: 8-22 mins   PT G Codes:        Claretha Cooper 06/11/2014, 9:25 AM

## 2014-06-11 NOTE — Evaluation (Signed)
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  Requires min to mod assist for LB ADL.Marland Kitchen Instructed pt in use of AE and recommended toilet riser or 3 in 1 as pt struggles with sit to stand and is tall, pt declined.  Educated pt at length in back precautions related to ADL and IADL. Pt eager to d/c home.    Follow Up Recommendations  No OT follow up    Equipment Recommendations       Recommendations for Other Services       Precautions / Restrictions Precautions Precautions: Back Precaution Booklet Issued: Yes (comment) Precaution Comments: reviewed back precautions related to ADL and IADL Required Braces or Orthoses: Spinal Brace Spinal Brace: Applied in sitting position;Lumbar corset Restrictions Weight Bearing Restrictions: No      Mobility Bed Mobility Overal bed mobility: Modified Independent             General bed mobility comments: cues for back precautions  Transfers Overall transfer level: Needs assistance Equipment used: None Transfers: Sit to/from Stand Sit to Stand: Supervision         General transfer comment: cues for safety, heavily guarding, moves slowly    Balance                                            ADL Overall ADL's : Needs assistance/impaired Eating/Feeding: Independent;Sitting   Grooming: Supervision/safety;Standing Grooming Details (indicate cue type and reason): instructed in 2 cup method for brushing teeth Upper Body Bathing: Supervision/ safety;Sitting Upper Body Bathing Details (indicate cue type and reason): recommended long handled bath sponge Lower Body Bathing: Moderate assistance;Sit to/from stand   Upper Body Dressing : Set up;Sitting Upper Body Dressing  Details (indicate cue type and reason): instructed and practiced use of back brace Lower Body Dressing: Sit to/from stand;Minimal assistance Lower Body Dressing Details (indicate cue type and reason): Instructed in use of sock aide, reacher and long shoe horn.  Recommended pt use slip on shoes with backs and non slip soles and elastic waist shorts.   Toilet Transfer Details (indicate cue type and reason): recommended 3 in 1 or toilet riser, pt refusing, but daughter will get for pt if needed   Toileting - Clothing Manipulation Details (indicate cue type and reason): instructed to avoid twist with pericare or use tongs     Functional mobility during ADLs: Supervision/safety       Vision     Perception     Praxis      Pertinent Vitals/Pain Pain Assessment: 0-10 Pain Score: 2  Pain Location: back Pain Descriptors / Indicators: Sore Pain Intervention(s): Limited activity within patient's tolerance;Premedicated before session;Monitored during session     Hand Dominance Right   Extremity/Trunk Assessment Upper Extremity Assessment Upper Extremity Assessment: Overall WFL for tasks assessed   Lower Extremity Assessment Lower Extremity Assessment: Defer to PT evaluation RLE Deficits / Details: walks with knees flexed LLE Deficits / Details: same as R   Cervical / Trunk Assessment Cervical / Trunk Assessment: Other exceptions Cervical / Trunk Exceptions: guarding in back   Communication Communication Communication: No difficulties   Cognition Arousal/Alertness: Awake/alert Behavior During Therapy: [facility] for  tasks assessed/performed Overall Cognitive Status: Within Functional Limits for tasks assessed                     General Comments       Exercises       Shoulder Instructions      Home Living Family/patient expects to be discharged to:: Private residence Living Arrangements: Spouse/significant other Available Help at Discharge: Family Type of Home:  House Home Access: Level entry     Home Layout: One level     Bathroom Shower/Tub: Teacher, early years/pre: [place]: None (can use Restaurant manager, fast food)          Prior Functioning/Environment Level of Independence: Independent             OT Diagnosis:     OT Problem List:     OT Treatment/Interventions:      OT Goals(Current goals can be found in the care plan section)    OT Frequency:     Barriers to D/C:            Co-evaluation              End of Session    Activity Tolerance: Patient tolerated treatment well Patient left:     Time: 3500-9381 OT Time Calculation (min): 25 min Charges:  OT General Charges $OT Visit: 1 Procedure OT Evaluation $Initial OT Evaluation Tier I: 1 Procedure OT Treatments $Self Care/Home Management : 8-22 mins G-Codes:    Malka So 06/11/2014, 9:27 AM  [phone]

## 2014-06-11 NOTE — Progress Notes (Signed)
Pt doing well. Pt and daughter given D/C instructions with Rx's, verbal understanding was provided. Pt's incision is clean and dry with no sign of infection. Pt's IV was removed prior to D/C. Pt D/C'd home via wheelchair per MD order. Pt is stable @ D/C and has no other needs at this time. Holli Humbles, RN

## 2014-06-15 NOTE — Anesthesia Postprocedure Evaluation (Signed)
  Anesthesia Post-op Note  Patient: Cameron Cardenas  Procedure(s) Performed: Procedure(s) (LRB): Redo Lumbar Five-Sacral One Decompression with maximum access posterior lumbar interbody fusion (N/A)  Patient Location: PACU  Anesthesia Type: General  Level of Consciousness: awake and alert   Airway and Oxygen Therapy: Patient Spontanous Breathing  Post-op Pain: mild  Post-op Assessment: Post-op Vital signs reviewed, Patient's Cardiovascular Status Stable, Respiratory Function Stable, Patent Airway and No signs of Nausea or vomiting  Last Vitals:  Filed Vitals:   06/11/14 0803  BP: 95/55  Pulse: 77  Temp: 36.9 C  Resp: 18    Post-op Vital Signs: stable   Complications: No apparent anesthesia complications

## 2014-06-27 ENCOUNTER — Other Ambulatory Visit (INDEPENDENT_AMBULATORY_CARE_PROVIDER_SITE_OTHER): Payer: 59

## 2014-06-27 DIAGNOSIS — E119 Type 2 diabetes mellitus without complications: Secondary | ICD-10-CM | POA: Diagnosis not present

## 2014-06-27 DIAGNOSIS — E785 Hyperlipidemia, unspecified: Secondary | ICD-10-CM

## 2014-06-27 LAB — COMPREHENSIVE METABOLIC PANEL
ALBUMIN: 4 g/dL (ref 3.5–5.2)
ALK PHOS: 53 U/L (ref 39–117)
ALT: 26 U/L (ref 0–53)
AST: 21 U/L (ref 0–37)
BUN: 19 mg/dL (ref 6–23)
CALCIUM: 9.3 mg/dL (ref 8.4–10.5)
CO2: 32 mEq/L (ref 19–32)
Chloride: 104 mEq/L (ref 96–112)
Creatinine, Ser: 1.19 mg/dL (ref 0.40–1.50)
GFR: 80.22 mL/min (ref >60.00)
Glucose, Bld: 115 mg/dL — ABNORMAL HIGH (ref 70–99)
POTASSIUM: 4.1 meq/L (ref 3.5–5.1)
SODIUM: 139 meq/L (ref 135–145)
Total Bilirubin: 0.7 mg/dL (ref 0.2–1.2)
Total Protein: 6.3 g/dL (ref 6.0–8.3)

## 2014-06-27 LAB — URINALYSIS, ROUTINE W REFLEX MICROSCOPIC
Bilirubin Urine: NEGATIVE
Hgb urine dipstick: NEGATIVE
Ketones, ur: NEGATIVE
Leukocytes, UA: NEGATIVE
Nitrite: NEGATIVE
PH: 6.5 (ref 5.0–8.0)
SPECIFIC GRAVITY, URINE: 1.02 (ref 1.000–1.030)
TOTAL PROTEIN, URINE-UPE24: NEGATIVE
URINE GLUCOSE: NEGATIVE
Urobilinogen, UA: 0.2 (ref 0.0–1.0)

## 2014-06-27 LAB — HEMOGLOBIN A1C: Hgb A1c MFr Bld: 5.7 % (ref 4.6–6.5)

## 2014-06-28 LAB — LIPOPROTEIN ANALYSIS BY NMR
HDL Particle Number: 35 umol/L (ref >=30.5)
LDL Particle Number: 1216 nmol/L — ABNORMAL HIGH (ref <1000)
LDL SIZE: 21.2 nm (ref >20.5)
LP-IR Score: <25 (ref <=45)
SMALL LDL PARTICLE NUMBER: 220 nmol/L (ref <=527)

## 2014-07-02 ENCOUNTER — Encounter: Payer: Self-pay | Admitting: Endocrinology

## 2014-07-02 ENCOUNTER — Ambulatory Visit (INDEPENDENT_AMBULATORY_CARE_PROVIDER_SITE_OTHER): Payer: 59 | Admitting: Endocrinology

## 2014-07-02 VITALS — BP 114/70 | HR 79 | Temp 98.3°F | Resp 16 | Ht 75.0 in | Wt 199.0 lb

## 2014-07-02 DIAGNOSIS — E785 Hyperlipidemia, unspecified: Secondary | ICD-10-CM | POA: Insufficient documentation

## 2014-07-02 DIAGNOSIS — E782 Mixed hyperlipidemia: Secondary | ICD-10-CM

## 2014-07-02 DIAGNOSIS — E1169 Type 2 diabetes mellitus with other specified complication: Secondary | ICD-10-CM | POA: Insufficient documentation

## 2014-07-02 DIAGNOSIS — E119 Type 2 diabetes mellitus without complications: Secondary | ICD-10-CM

## 2014-07-02 LAB — MICROALBUMIN / CREATININE URINE RATIO
CREATININE, U: 183.5 mg/dL
MICROALB/CREAT RATIO: 0.4 mg/g (ref 0.0–30.0)

## 2014-07-02 MED ORDER — ATORVASTATIN CALCIUM 80 MG PO TABS: 80.0000 mg | ORAL_TABLET | Freq: Every day | ORAL | Status: DC

## 2014-07-02 NOTE — Patient Instructions (Signed)
Check blood sugars on waking up .Marland Kitchen 2-3 .Marland Kitchen times a week Also check blood sugars about 2 hours after a meal and do this after different meals by rotation  Recommended blood sugar levels on waking up is 90-130 and about 2 hours after meal is 140-180 Please bring blood sugar monitor to each visit.  Increase Lipitor to 80mg 

## 2014-07-02 NOTE — Progress Notes (Signed)
Patient ID: Cameron Cardenas, male   DOB: 05/30/1955, 60 y.o.   MRN: [id number]   Reason for Appointment: Diabetes follow-up   History of Present Illness   Diagnosis: Type 2 DIABETES MELITUS, date of diagnosis: 2004  PAST history: He had mild diabetes at onset and was treated with metformin and subsequently Actoplusmet In 2013 he had changed his diet significantly and was exercising. This helped him with weight loss Subsequently his blood sugars have been excellent with A1c upper normal  RECENT history:  He has not been seen in followup for nearly a year again No records are available from his PCP about any follow-up recently  His blood sugars are still very well controlled with taking Actoplusmet twice a day. He is very compliant with this Again has a normal A1c and his lab glucose is 115 fasting He had previously lost a lot of weight but this appears to be gradually going up However he had not been able to exercise because of his lumbar disc problems and recent surgery He still thinks that he is compliant with his diet with low fat intake     Oral hypoglycemic drugs: Actoplusmet         Side effects from medications: None Monitors blood glucose:  occasionally, he thinks fasting readings are about 110     Physical activity: exercise: walking         Dietician visit: Most recent: 78/67           Complications: are: None, has normal microalbumin    Wt Readings from Last 3 Encounters:  07/02/14 199 lb (90.266 kg)  06/04/14 195 lb 3.2 oz (88.542 kg)  08/15/13 190 lb (86.183 kg)   Lab Results  Component Value Date   HGBA1C 5.7 06/27/2014   HGBA1C 6.1* 06/04/2014   HGBA1C 5.7 07/15/2013   Lab Results  Component Value Date   MICROALBUR 0.5 08/28/2012   LDLCALC 89 07/15/2013   CREATININE 1.19 06/27/2014      Medication List       This list is accurate as of: 07/02/14 10:21 AM.  Always use your most recent med list.               aspirin 81 MG tablet  Take  81 mg by mouth daily.     atorvastatin 80 MG tablet  Commonly known as:  LIPITOR  Take 1 tablet (80 mg total) by mouth daily.     cetirizine 10 MG tablet  Commonly known as:  ZYRTEC  Take 10 mg by mouth daily.     HYDROcodone-acetaminophen 10-325 MG per tablet  Commonly known as:  NORCO  Take 1-2 tablets by mouth every 4 (four) hours as needed for moderate pain or severe pain.     lisinopril-hydrochlorothiazide 10-12.5 MG per tablet  Commonly known as:  PRINZIDE,ZESTORETIC  Take 1 tablet by mouth daily.     pioglitazone-metformin 15-500 MG per tablet  Commonly known as:  ACTOPLUS MET  TAKE ONE TABLET BY MOUTH TWICE DAILY        Allergies:  Allergies  Allergen Reactions  . Percocet [Oxycodone-Acetaminophen] Other (See Comments)    Caused hallucinations    Past Medical History  Diagnosis Date  . Lumbago   . Essential hypertension, malignant   . Pure hypercholesterolemia   . Allergic rhinitis due to pollen   . Unspecified vitamin D deficiency   . Type II or unspecified type diabetes mellitus without mention of complication, not stated as uncontrolled  dx in 2005    Past Surgical History  Procedure Laterality Date  . Lumbar disc surgery  2011  . Colonoscopy      in [facility], MD no longer in practice, does not recall the name of facility. Does belive polyps were removed  . Maximum access (mas)posterior lumbar interbody fusion (plif) 1 level N/A 06/10/2014    Procedure: Redo Lumbar Five-Sacral One Decompression with maximum access posterior lumbar interbody fusion;  Surgeon: Erline Levine, MD;  Location: [facility];  Service: Neurosurgery;  Laterality: N/A;  Redo Decompression with maximum access posterior lumbar interbody fusion, L5-S1    Family History  Problem Relation Age of Onset  . Colon cancer Neg Hx   . Esophageal cancer Neg Hx   . Rectal cancer Neg Hx   . Stomach cancer Neg Hx   . Heart disease Neg Hx   . Hypertension Mother   . Hypertension Father   .  Diabetes Sister     Social History:  reports that he quit smoking about 21 years ago. He has never used smokeless tobacco. He reports that he drinks alcohol. He reports that he uses illicit drugs (Marijuana).   Office Visit on 07/02/2014  Component Date Value Ref Range Status  . HM Diabetic Foot Exam 06/02/2014 normal   Final  Lab on 06/27/2014  Component Date Value Ref Range Status  . Hgb A1c MFr Bld 06/27/2014 5.7  4.6 - 6.5 % Final   Glycemic Control Guidelines for People with Diabetes:Non Diabetic:  <6%Goal of Therapy: <7%Additional Action Suggested:  >8%   . Sodium 06/27/2014 139  135 - 145 mEq/L Final  . Potassium 06/27/2014 4.1  3.5 - 5.1 mEq/L Final  . Chloride 06/27/2014 104  96 - 112 mEq/L Final  . CO2 06/27/2014 32  19 - 32 mEq/L Final  . Glucose, Bld 06/27/2014 115* 70 - 99 mg/dL Final  . BUN 06/27/2014 19  6 - 23 mg/dL Final  . Creatinine, Ser 06/27/2014 1.19  0.40 - 1.50 mg/dL Final  . Total Bilirubin 06/27/2014 0.7  0.2 - 1.2 mg/dL Final  . Alkaline Phosphatase 06/27/2014 53  39 - 117 U/L Final  . AST 06/27/2014 21  0 - 37 U/L Final  . ALT 06/27/2014 26  0 - 53 U/L Final  . Total Protein 06/27/2014 6.3  6.0 - 8.3 g/dL Final  . Albumin 06/27/2014 4.0  3.5 - 5.2 g/dL Final  . Calcium 06/27/2014 9.3  8.4 - 10.5 mg/dL Final  . GFR 06/27/2014 80.22  >60.00 mL/min Final  . LDL Particle Number 06/27/2014 1216* <1000 nmol/L Final   Comment:                           Low                   < 1000                           Moderate         1000 - 1299                           Borderline-High  1300 - 1599                           High  1600 - 2000                           Very High             > 2000   . HDL Particle Number 06/27/2014 35.0  >=30.5 umol/L Final  . Small LDL Particle Number 06/27/2014 220  <=527 nmol/L Final  . LDL Size 06/27/2014 21.2  >20.5 nm Final   Comment:  ----------------------------------------------------------                  **  INTERPRETATIVE INFORMATION**                  PARTICLE CONCENTRATION AND SIZE                     <--Lower CVD Risk   Higher CVD Risk-->   LDL AND HDL PARTICLES   Percentile in Reference Population   HDL-P (total)        High     75th    50th    25th   Low                        >34.9    34.9    30.5    26.7   <26.7   Small LDL-P          Low      25th    50th    75th   High                        <117     117     527     839    >839   LDL Size   <-Large (Pattern A)->    <-Small (Pattern B)->                     23.0    20.6           20.5      19.0  ---------------------------------------------------------- Small LDL-P and LDL Size are associated with CVD risk, but not after LDL-P is taken into account. These assays were developed and their performance characteristics determined by [employer]. These assays have not been cleared by the [name] Food and Drug Administration. The clinical utility o                          f these laboratory values have not been fully established.   Marland Kitchen LP-IR Score 06/27/2014 <25  <=45 Final   Comment: INSULIN RESISTANCE MARKER     <--Insulin Sensitive    Insulin Resistant-->            Percentile in Reference Population Insulin Resistance Score LP-IR Score   Low   25th   50th   75th   High               <27   27     45     63     >63 LP-IR Score is inaccurate if patient is non-fasting. The LP-IR score is a laboratory developed index that has been associated with insulin resistance and diabetes risk and should be used as one component of a physician's clinical assessment. The LP-IR score listed above has not been cleared by the [name] Food and Drug Administration.   . Color, Urine  06/27/2014 YELLOW  Yellow;Lt. Yellow Final  . APPearance 06/27/2014 CLEAR  Clear Final  . Specific Gravity, Urine 06/27/2014 1.020  1.000-1.030 Final  . pH 06/27/2014 6.5  5.0 - 8.0 Final  . Total Protein, Urine 06/27/2014 NEGATIVE  Negative Final  . Urine Glucose 06/27/2014  NEGATIVE  Negative Final  . Ketones, ur 06/27/2014 NEGATIVE  Negative Final  . Bilirubin Urine 06/27/2014 NEGATIVE  Negative Final  . Hgb urine dipstick 06/27/2014 NEGATIVE  Negative Final  . Urobilinogen, UA 06/27/2014 0.2  0.0 - 1.0 Final  . Leukocytes, UA 06/27/2014 NEGATIVE  Negative Final  . Nitrite 06/27/2014 NEGATIVE  Negative Final  . WBC, UA 06/27/2014 0-2/hpf  0-2/hpf Final  . RBC / HPF 06/27/2014 0-2/hpf  0-2/hpf Final  . Mucus, UA 06/27/2014 Presence of* None Final  . Squamous Epithelial / LPF 06/27/2014 Rare(0-4/hpf)  Rare(0-4/hpf) Final  . Renal Epithel, UA 06/27/2014 Rare(0-4/hpf)* None Final  . Granular Casts, UA 06/27/2014 Presence of* None Final    Review of Systems:  HYPERTENSION:  now controlled with Zestoretic 10/12.5  HYPERLIPIDEMIA: The lipid abnormality consists of elevated LDL which was 94 previously; however before adding WelChol was 157. Has been on 40 mg atorvastatin since 12/13 and previously was taking WelChol since 10/13 However he did have LDL particle number of 1348 previously and this is still relatively high at 1216     he had a foot exam with a podiatrist in 5/16   Examination:   BP 114/70 mmHg  Pulse 79  Temp(Src) 98.3 F (36.8 C)  Resp 16  Ht 6' 3" (1.905 m)  Wt 199 lb (90.266 kg)  BMI 24.87 kg/m2  SpO2 97%  Body mass index is 24.87 kg/(m^2).    ASSESSMENT/ PLAN:   Diabetes type 2 with BMI 25  He has had mild diabetes with good control of Actoplusmet 15/500 twice a day His A1c is upper normal Although the may do well without medications and simply lifestyle changes he probably does have continued insulin resistance and will be beneficial to continue him on the Actoplusmet which he is comfortable taking He does need to start monitoring blood sugars periodically after meals to continue to improve his diet  Will check urine microalbumin today  HYPERLIPIDEMIA with increased LDL particle number  Will increase his Lipitor to 80  mg  Hypertension: Now controlled with Zestoretic  Myrikal Messmer 07/02/2014, 10:21 AM

## 2014-08-05 ENCOUNTER — Ambulatory Visit: Payer: 59 | Admitting: Internal Medicine

## 2014-09-01 ENCOUNTER — Ambulatory Visit: Payer: 59 | Admitting: Podiatry

## 2014-09-30 ENCOUNTER — Other Ambulatory Visit: Payer: Self-pay | Admitting: Endocrinology

## 2014-11-28 ENCOUNTER — Other Ambulatory Visit: Payer: Self-pay | Admitting: *Deleted

## 2014-11-28 MED ORDER — PIOGLITAZONE HCL-METFORMIN HCL 15-500 MG PO TABS: 1.0000 | ORAL_TABLET | Freq: Two times a day (BID) | ORAL | Status: DC

## 2014-11-28 MED ORDER — ATORVASTATIN CALCIUM 80 MG PO TABS: 80.0000 mg | ORAL_TABLET | Freq: Every day | ORAL | Status: DC

## 2014-12-10 ENCOUNTER — Ambulatory Visit (INDEPENDENT_AMBULATORY_CARE_PROVIDER_SITE_OTHER): Payer: 59 | Admitting: Podiatry

## 2014-12-10 ENCOUNTER — Encounter: Payer: Self-pay | Admitting: Podiatry

## 2014-12-10 DIAGNOSIS — B351 Tinea unguium: Secondary | ICD-10-CM | POA: Diagnosis not present

## 2014-12-10 DIAGNOSIS — E1140 Type 2 diabetes mellitus with diabetic neuropathy, unspecified: Secondary | ICD-10-CM | POA: Diagnosis not present

## 2014-12-10 DIAGNOSIS — M79673 Pain in unspecified foot: Secondary | ICD-10-CM | POA: Diagnosis not present

## 2014-12-10 DIAGNOSIS — M2140 Flat foot [pes planus] (acquired), unspecified foot: Secondary | ICD-10-CM | POA: Diagnosis not present

## 2014-12-10 DIAGNOSIS — E119 Type 2 diabetes mellitus without complications: Secondary | ICD-10-CM

## 2014-12-10 NOTE — Progress Notes (Signed)
Subjective:     Patient ID: Cameron Cardenas, male   DOB: [birth year], 59 y.o.   MRN: [id number]  HPI patient presents with thick painful nailbeds 1-5 both feet that he cannot cut. Patient is also long-term diabetes has flatfeet pain in his feet and shooting burning tingling pain at times Review of Systems     Objective:   Physical Exam Neurovascular status intact with thick brittle yellow nailbeds 1-5 both feet that are painful neuropathic changes associated with diabetes with flatfoot deformity and pain    Assessment:     Mycotic nail infection with pain 1-5 both feet. 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 Advised on long-term orthotics and we will get him approved for orthotics to help to control pathology

## 2014-12-16 ENCOUNTER — Ambulatory Visit: Payer: 59 | Admitting: *Deleted

## 2014-12-16 DIAGNOSIS — R52 Pain, unspecified: Secondary | ICD-10-CM

## 2014-12-16 NOTE — Progress Notes (Signed)
Patient ID: Cameron Cardenas, male   DOB: August 27, 1955, 59 y.o.   MRN: [id number] Patient presents to be scanned for orthotics.

## 2014-12-17 ENCOUNTER — Other Ambulatory Visit: Payer: Self-pay | Admitting: *Deleted

## 2014-12-17 MED ORDER — LISINOPRIL-HYDROCHLOROTHIAZIDE 10-12.5 MG PO TABS: 1.0000 | ORAL_TABLET | Freq: Every day | ORAL | Status: DC

## 2014-12-29 ENCOUNTER — Other Ambulatory Visit (INDEPENDENT_AMBULATORY_CARE_PROVIDER_SITE_OTHER): Payer: 59

## 2014-12-29 DIAGNOSIS — E119 Type 2 diabetes mellitus without complications: Secondary | ICD-10-CM

## 2014-12-29 LAB — COMPREHENSIVE METABOLIC PANEL
ALT: 27 U/L (ref 0–53)
AST: 17 U/L (ref 0–37)
Albumin: 3.8 g/dL (ref 3.5–5.2)
Alkaline Phosphatase: 41 U/L (ref 39–117)
BUN: 18 mg/dL (ref 6–23)
CO2: 32 meq/L (ref 19–32)
Calcium: 9.3 mg/dL (ref 8.4–10.5)
Chloride: 108 mEq/L (ref 96–112)
Creatinine, Ser: 1.3 mg/dL (ref 0.40–1.50)
GFR: 72.31 mL/min (ref >60.00)
Glucose, Bld: 127 mg/dL — ABNORMAL HIGH (ref 70–99)
Potassium: 4.3 mEq/L (ref 3.5–5.1)
SODIUM: 143 meq/L (ref 135–145)
Total Bilirubin: 0.8 mg/dL (ref 0.2–1.2)
Total Protein: 6.1 g/dL (ref 6.0–8.3)

## 2014-12-29 LAB — HEMOGLOBIN A1C: Hgb A1c MFr Bld: 5.8 % (ref 4.6–6.5)

## 2014-12-30 LAB — HM DIABETES EYE EXAM

## 2015-01-01 ENCOUNTER — Ambulatory Visit (INDEPENDENT_AMBULATORY_CARE_PROVIDER_SITE_OTHER): Payer: 59 | Admitting: Endocrinology

## 2015-01-01 ENCOUNTER — Encounter: Payer: Self-pay | Admitting: Endocrinology

## 2015-01-01 VITALS — BP 116/74 | HR 76 | Temp 97.8°F | Resp 14 | Ht 75.0 in | Wt 209.2 lb

## 2015-01-01 DIAGNOSIS — E782 Mixed hyperlipidemia: Secondary | ICD-10-CM | POA: Diagnosis not present

## 2015-01-01 DIAGNOSIS — E119 Type 2 diabetes mellitus without complications: Secondary | ICD-10-CM

## 2015-01-01 NOTE — Progress Notes (Signed)
Patient ID: Cameron Cardenas, male   DOB: April 30, 1955, 59 y.o.   MRN: [id number]   Reason for Appointment: Diabetes follow-up   History of Present Illness   Diagnosis: Type 2 DIABETES MELITUS, date of diagnosis: 2004  PAST history: He had mild diabetes at onset and was treated with metformin and subsequently Actoplusmet In 2013 he had changed his diet significantly and was exercising. This helped him with weight loss Subsequently his blood sugars have been excellent with A1c upper normal  RECENT history:   His blood sugars are still very well controlled with taking Actoplusmet twice a day. He is very compliant with this Again has a normal A1c   Current management, problems and blood sugar patterns:  His lab glucose is relatively high at 127  He has been noncompliant with checking his blood sugar at home even though he admitted been doing that before  Recently not exercising, previously was walking 5 miles a week and not motivated.  He said that he is working late hours  Although he has previously seen the dietitian he has gained 10 pounds since his last visit     Oral hypoglycemic drugs: Actoplusmet 15/500 twice a day         Side effects from medications: None Monitors blood glucose: none recently   Physical activity: exercise: not walking at present        Dietician visit: Most recent: 78/29           Complications: are: None, has normal microalbumin    Wt Readings from Last 3 Encounters:  01/01/15 209 lb 3.2 oz (94.892 kg)  07/02/14 199 lb (90.266 kg)  06/04/14 195 lb 3.2 oz (88.542 kg)   Lab Results  Component Value Date   HGBA1C 5.8 12/29/2014   HGBA1C 5.7 06/27/2014   HGBA1C 6.1* 06/04/2014   Lab Results  Component Value Date   MICROALBUR <0.7 07/02/2014   LDLCALC 89 07/15/2013   CREATININE 1.30 12/29/2014      Medication List       This list is accurate as of: 01/01/15 11:30 AM.  Always use your most recent med list.               aspirin 81 MG tablet  Take 81 mg by mouth daily.     atorvastatin 80 MG tablet  Commonly known as:  LIPITOR  Take 1 tablet (80 mg total) by mouth daily.     cetirizine 10 MG tablet  Commonly known as:  ZYRTEC  Take 10 mg by mouth daily.     HYDROcodone-acetaminophen 10-325 MG tablet  Commonly known as:  NORCO  Take 1-2 tablets by mouth every 4 (four) hours as needed for moderate pain or severe pain.     lisinopril-hydrochlorothiazide 10-12.5 MG tablet  Commonly known as:  PRINZIDE,ZESTORETIC  Take 1 tablet by mouth daily.     pioglitazone-metformin 15-500 MG tablet  Commonly known as:  ACTOPLUS MET  Take 1 tablet by mouth 2 (two) times daily.        Allergies:  Allergies  Allergen Reactions  . Percocet [Oxycodone-Acetaminophen] Other (See Comments)    Caused hallucinations    Past Medical History  Diagnosis Date  . Lumbago   . Essential hypertension, malignant   . Pure hypercholesterolemia   . Allergic rhinitis due to pollen   . Unspecified vitamin D deficiency   . Type II or unspecified type diabetes mellitus without mention of complication, not stated as uncontrolled  dx in 2005    Past Surgical History  Procedure Laterality Date  . Lumbar disc surgery  2011  . Colonoscopy      in [facility], MD no longer in practice, does not recall the name of facility. Does belive polyps were removed  . Maximum access (mas)posterior lumbar interbody fusion (plif) 1 level N/A 06/10/2014    Procedure: Redo Lumbar Five-Sacral One Decompression with maximum access posterior lumbar interbody fusion;  Cardenas: Erline Levine, MD;  Location: [facility];  Service: Neurosurgery;  Laterality: N/A;  Redo Decompression with maximum access posterior lumbar interbody fusion, L5-S1    Family History  Problem Relation Age of Onset  . Colon cancer Neg Hx   . Esophageal cancer Neg Hx   . Rectal cancer Neg Hx   . Stomach cancer Neg Hx   . Heart disease Neg Hx   . Hypertension Mother   .  Hypertension Father   . Diabetes Sister     Social History:  reports that he quit smoking about 22 years ago. He has never used smokeless tobacco. He reports that he drinks alcohol. He reports that he uses illicit drugs (Marijuana).   Lab on 12/29/2014  Component Date Value Ref Range Status  . Hgb A1c MFr Bld 12/29/2014 5.8  4.6 - 6.5 % Final   Glycemic Control Guidelines for People with Diabetes:Non Diabetic:  <6%Goal of Therapy: <7%Additional Action Suggested:  >8%   . Sodium 12/29/2014 143  135 - 145 mEq/L Final  . Potassium 12/29/2014 4.3  3.5 - 5.1 mEq/L Final  . Chloride 12/29/2014 108  96 - 112 mEq/L Final  . CO2 12/29/2014 32  19 - 32 mEq/L Final  . Glucose, Bld 12/29/2014 127* 70 - 99 mg/dL Final  . BUN 12/29/2014 18  6 - 23 mg/dL Final  . Creatinine, Ser 12/29/2014 1.30  0.40 - 1.50 mg/dL Final  . Total Bilirubin 12/29/2014 0.8  0.2 - 1.2 mg/dL Final  . Alkaline Phosphatase 12/29/2014 41  39 - 117 U/L Final  . AST 12/29/2014 17  0 - 37 U/L Final  . ALT 12/29/2014 27  0 - 53 U/L Final  . Total Protein 12/29/2014 6.1  6.0 - 8.3 g/dL Final  . Albumin 12/29/2014 3.8  3.5 - 5.2 g/dL Final  . Calcium 12/29/2014 9.3  8.4 - 10.5 mg/dL Final  . GFR 12/29/2014 72.31  >60.00 mL/min Final    Review of Systems:  HYPERTENSION:   controlled with Zestoretic 10/12.5  HYPERLIPIDEMIA: The lipid abnormality consists of elevated LDL which was 94 previously Has been on 40 mg atorvastatin since 12/13 and this was increased to 80 mg in 6/16 However he did have LDL particle number of 1348 previously and which was relatively high at 1216 in 6/16     He had a foot exam with a podiatrist in 5/16   Examination:   BP 116/74 mmHg  Pulse 76  Temp(Src) 97.8 F (36.6 C)  Resp 14  Ht '6\' 3"'$  (1.905 m)  Wt 209 lb 3.2 oz (94.892 kg)  BMI 26.15 kg/m2  SpO2 97%  Body mass index is 26.15 kg/(m^2).    ASSESSMENT/ PLAN:   Diabetes type 2 with BMI 25  He has had mild diabetes with good control of  Actoplusmet 15/500 twice a day His A1c is upper normal and fairly consistent He has however gained weight from not exercising and also not being consistent with diet  Discussed need to be consistent with diet and exercise  regimen to avoid further insulin resistance and worsening of the diabetes Consultation with diabetes educator/dietitian for meal planning advice Reminded him  to check his blood sugars periodically including after meals   HYPERLIPIDEMIA with increased LDL particle number  Will need follow-up levels  Hypertension: Now controlled with Zestoretic  Dennie Vecchio 01/01/2015, 11:30 AM   01 more thing,

## 2015-01-01 NOTE — Patient Instructions (Addendum)
Walk 2 miles, 4-5x per week  Check blood sugars on waking up  2 times a week Also check blood sugars about 2 hours after a meal and do this after different meals by rotation  Recommended blood sugar levels on waking up is 90-130 and about 2 hours after meal is 130-160  Please bring your blood sugar monitor to each visit, thank you

## 2015-01-13 ENCOUNTER — Encounter: Payer: Self-pay | Admitting: *Deleted

## 2015-01-13 ENCOUNTER — Ambulatory Visit: Payer: 59 | Admitting: *Deleted

## 2015-01-13 DIAGNOSIS — M79673 Pain in unspecified foot: Secondary | ICD-10-CM

## 2015-01-13 NOTE — Progress Notes (Signed)
Patient ID: Cameron Cardenas, male   DOB: 01-Jul-1955, 60 y.o.   MRN: [id number] Patient presents for orthotic pick up.  Verbal and written break in and wear instructions given.  Patient will follow up in 4 weeks if symptoms worsen or fail to improve.

## 2015-01-13 NOTE — Patient Instructions (Signed)

## 2015-01-21 ENCOUNTER — Other Ambulatory Visit: Payer: Self-pay | Admitting: Endocrinology

## 2015-02-09 ENCOUNTER — Other Ambulatory Visit: Payer: Self-pay | Admitting: Endocrinology

## 2015-02-12 ENCOUNTER — Telehealth: Payer: Self-pay | Admitting: Hematology

## 2015-02-12 NOTE — Telephone Encounter (Signed)
new patient appt-s/w patient and gave np appt for 02/15 @ 11 w/Dr. Irene Limbo Referring Dr. Lysle Rubens Dx- Low WBC

## 2015-02-25 ENCOUNTER — Ambulatory Visit (HOSPITAL_BASED_OUTPATIENT_CLINIC_OR_DEPARTMENT_OTHER): Payer: 59 | Admitting: Hematology

## 2015-02-25 ENCOUNTER — Ambulatory Visit (HOSPITAL_BASED_OUTPATIENT_CLINIC_OR_DEPARTMENT_OTHER): Payer: 59

## 2015-02-25 ENCOUNTER — Telehealth: Payer: Self-pay | Admitting: Hematology

## 2015-02-25 VITALS — BP 109/71 | HR 67 | Temp 98.1°F | Resp 18 | Ht 75.0 in | Wt 205.6 lb

## 2015-02-25 DIAGNOSIS — E559 Vitamin D deficiency, unspecified: Secondary | ICD-10-CM | POA: Diagnosis not present

## 2015-02-25 DIAGNOSIS — D696 Thrombocytopenia, unspecified: Secondary | ICD-10-CM

## 2015-02-25 DIAGNOSIS — E119 Type 2 diabetes mellitus without complications: Secondary | ICD-10-CM

## 2015-02-25 DIAGNOSIS — D72819 Decreased white blood cell count, unspecified: Secondary | ICD-10-CM | POA: Insufficient documentation

## 2015-02-25 DIAGNOSIS — N182 Chronic kidney disease, stage 2 (mild): Secondary | ICD-10-CM

## 2015-02-25 DIAGNOSIS — J301 Allergic rhinitis due to pollen: Secondary | ICD-10-CM

## 2015-02-25 LAB — CBC & DIFF AND RETIC
BASO%: 0.8 % (ref 0.0–2.0)
BASOS ABS: 0 10*3/uL (ref 0.0–0.1)
EOS ABS: 0.1 10*3/uL (ref 0.0–0.5)
EOS%: 3.1 % (ref 0.0–7.0)
HEMATOCRIT: 42.8 % (ref 38.4–49.9)
HEMOGLOBIN: 14.4 g/dL (ref 13.0–17.1)
Immature Retic Fract: 2.9 % — ABNORMAL LOW (ref 3.00–10.60)
LYMPH%: 44.1 % (ref 14.0–49.0)
MCH: 28.9 pg (ref 27.2–33.4)
MCHC: 33.6 g/dL (ref 32.0–36.0)
MCV: 85.9 fL (ref 79.3–98.0)
MONO#: 0.3 10*3/uL (ref 0.1–0.9)
MONO%: 9.8 % (ref 0.0–14.0)
NEUT#: 1.1 10*3/uL — ABNORMAL LOW (ref 1.5–6.5)
NEUT%: 42.2 % (ref 39.0–75.0)
Platelets: 123 10*3/uL — ABNORMAL LOW (ref 140–400)
RBC: 4.98 10*6/uL (ref 4.20–5.82)
RDW: 14.5 % (ref 11.0–14.6)
RETIC %: 1.08 % (ref 0.80–1.80)
RETIC CT ABS: 53.78 10*3/uL (ref 34.80–93.90)
WBC: 2.6 10*3/uL — ABNORMAL LOW (ref 4.0–10.3)
lymph#: 1.1 10*3/uL (ref 0.9–3.3)

## 2015-02-25 LAB — COMPREHENSIVE METABOLIC PANEL
ALBUMIN: 3.9 g/dL (ref 3.5–5.0)
ALK PHOS: 46 U/L (ref 40–150)
ALT: 36 U/L (ref 0–55)
AST: 23 U/L (ref 5–34)
Anion Gap: 6 mEq/L (ref 3–11)
BILIRUBIN TOTAL: 0.97 mg/dL (ref 0.20–1.20)
BUN: 15.8 mg/dL (ref 7.0–26.0)
CALCIUM: 9 mg/dL (ref 8.4–10.4)
CO2: 31 mEq/L — ABNORMAL HIGH (ref 22–29)
CREATININE: 1.2 mg/dL (ref 0.7–1.3)
Chloride: 107 mEq/L (ref 98–109)
EGFR: 76 mL/min/{1.73_m2} — ABNORMAL LOW (ref >90)
Glucose: 82 mg/dl (ref 70–140)
Potassium: 4.4 mEq/L (ref 3.5–5.1)
Sodium: 143 mEq/L (ref 136–145)
Total Protein: 6.5 g/dL (ref 6.4–8.3)

## 2015-02-25 LAB — CHCC SMEAR

## 2015-02-25 LAB — TECHNOLOGIST REVIEW

## 2015-02-25 NOTE — Progress Notes (Signed)
[facility]    HEMATOLOGY/ONCOLOGY CONSULTATION NOTE  Date of Service: 02/25/2015  Patient Care Team: Wenda Low, MD as PCP - General (Internal Medicine)  CHIEF COMPLAINTS/PURPOSE OF CONSULTATION:    HISTORY OF PRESENTING ILLNESS:  Cameron Cardenas is a wonderful 60 y.o. male who has been referred to [name] by Dr Lysle Rubens for evaluation and management of leukopenia and thrombocytopenia.  Patient has a history of hypertension,diabetes, dyslipidemia, vitamin D deficiency, allergic rhinitis to the pollen,chronic low back pain, chronic kidney disease stage II. He had annual labs of this primary care physician and was noted to have some leukopenia of 3.2k, normal hemoglobin of 14.4 with an MCV of 86 and thrombocytopenia of 109k. CMP was predominantly within normal limits.  He was referred to [name] for the low platelets and low WBC counts.  No issues with recurrent infections, easy bruising or bleeding.  Energy level is good No Fevers chills night sweats, enlarged lymph nodes, abdominal pain or distention.  Patient reports no other focal concerns or new symptoms. Denies excessive alcohol use.  Labs done in our clinic showed a WBC count of 2.6k with an ANC of 1.1k, normal hemoglobin of 14.4 and platelet count 123k. His previous labs showed WBC count of 2.7k on 5/25th 2016 and 3.4k on 07/30/2009.   MEDICAL HISTORY:  Past Medical History  Diagnosis Date  . Lumbago   . Essential hypertension, malignant   . Pure hypercholesterolemia   . Allergic rhinitis due to pollen   . Unspecified vitamin D deficiency   . Type II or unspecified type diabetes mellitus without mention of complication, not stated as uncontrolled     dx in 2005  chronic kidney disease. Stage II Chronic low back pain Erectile dysfunction  SURGICAL HISTORY: Past Surgical History  Procedure Laterality Date  . Lumbar disc surgery  2011  . Colonoscopy      in [facility], MD no longer in practice, does not recall the name of facility. Does  belive polyps were removed  . Maximum access (mas)posterior lumbar interbody fusion (plif) 1 level N/A 06/10/2014    Procedure: Redo Lumbar Five-Sacral One Decompression with maximum access posterior lumbar interbody fusion;  Surgeon: Erline Levine, MD;  Location: [place] [facility];  Service: Neurosurgery;  Laterality: N/A;  Redo Decompression with maximum access posterior lumbar interbody fusion, L5-S1    SOCIAL HISTORY: Social History   Social History  . Marital Status: Single    Spouse Name: N/A  . Number of Children: N/A  . Years of Education: N/A   Occupational History  . Not on file.   Social History Main Topics  . Smoking status: Former Smoker    Quit date: 08/28/1992  . Smokeless tobacco: Never Used  . Alcohol Use: 0.0 oz/week    0 Standard drinks or equivalent per week     Comment: occassional  . Drug Use: Yes    Special: Marijuana     Comment: occas.  . Sexual Activity: Not on file   Other Topics Concern  . Not on file   Social History Narrative  ETOH 1 pint in 6 months  FAMILY HISTORY: Family History  Problem Relation Age of Onset  . Colon cancer Neg Hx   . Esophageal cancer Neg Hx   . Rectal cancer Neg Hx   . Stomach cancer Neg Hx   . Heart disease Neg Hx   . Hypertension Mother   . Hypertension Father   . Diabetes Sister     ALLERGIES:  is allergic  to percocet.  MEDICATIONS:  Current Outpatient Prescriptions  Medication Sig Dispense Refill  . aspirin 81 MG tablet Take 81 mg by mouth daily.    Marland Kitchen atorvastatin (LIPITOR) 80 MG tablet Take 1 tablet by mouth  daily 90 tablet 1  . cetirizine (ZYRTEC) 10 MG tablet Take 10 mg by mouth daily.    Marland Kitchen HYDROcodone-acetaminophen (NORCO) 10-325 MG per tablet Take 1-2 tablets by mouth every 4 (four) hours as needed for moderate pain or severe pain. 100 tablet 0  . lisinopril-hydrochlorothiazide (PRINZIDE,ZESTORETIC) 10-12.5 MG tablet Take 1 tablet by mouth  daily 90 tablet 0  . pioglitazone-metformin (ACTOPLUS MET)  15-500 MG tablet Take 1 tablet by mouth two  times daily 180 tablet 1   No current facility-administered medications for this visit.    REVIEW OF SYSTEMS:    10 Point review of Systems was done is negative except as noted above.  PHYSICAL EXAMINATION: ECOG PERFORMANCE STATUS: 1 - Symptomatic but completely ambulatory  . Filed Vitals:   02/25/15 1127  BP: 109/71  Pulse: 67  Temp: 98.1 F (36.7 C)  Resp: 18   Filed Weights   02/25/15 1127  Weight: 205 lb 9.6 oz (93.26 kg)   .Body mass index is 25.7 kg/(m^2).  GENERAL:alert, in no acute distress and comfortable SKIN: skin color, texture, turgor are normal, no rashes or significant lesions EYES: normal, conjunctiva are pink and non-injected, sclera clear OROPHARYNX:no exudate, no erythema and lips, buccal mucosa, and tongue normal  NECK: supple, no JVD, thyroid normal size, non-tender, without nodularity LYMPH:  no palpable lymphadenopathy in the cervical, axillary or inguinal LUNGS: clear to auscultation with normal respiratory effort HEART: regular rate & rhythm,  no murmurs and no lower extremity edema ABDOMEN: abdomen soft, non-tender, normoactive bowel sounds , no palpable hepatosplenomegaly area Musculoskeletal: no cyanosis of digits and no clubbing  PSYCH: alert & oriented x 3 with fluent speech NEURO: no focal motor/sensory deficits  LABORATORY DATA:  I have reviewed the data as listed  . CBC Latest Ref Rng 02/25/2015 06/04/2014 07/30/2009  WBC 4.0 - 10.3 10e3/uL 2.6(L) 2.7(L) 3.4(L)  Hemoglobin 13.0 - 17.1 g/dL 14.4 14.5 13.9  Hematocrit 38.4 - 49.9 % 42.8 42.5 40.1  Platelets 140 - 400 10e3/uL 123 Large platelets present(L) 134(L) 148(L)   . CBC    Component Value Date/Time   WBC 2.6* 02/25/2015 1222   WBC 2.7* 06/04/2014 0853   RBC 4.98 02/25/2015 1222   RBC 5.07 06/04/2014 0853   HGB 14.4 02/25/2015 1222   HGB 14.5 06/04/2014 0853   HCT 42.8 02/25/2015 1222   HCT 42.5 06/04/2014 0853   PLT 123 Large  platelets present* 02/25/2015 1222   PLT 134* 06/04/2014 0853   MCV 85.9 02/25/2015 1222   MCV 83.8 06/04/2014 0853   MCH 28.9 02/25/2015 1222   MCH 28.6 06/04/2014 0853   MCHC 33.6 02/25/2015 1222   MCHC 34.1 06/04/2014 0853   RDW 14.5 02/25/2015 1222   RDW 14.9 06/04/2014 0853   LYMPHSABS 1.1 02/25/2015 1222   LYMPHSABS 1.2 07/30/2009 1815   MONOABS 0.3 02/25/2015 1222   MONOABS 0.4 07/30/2009 1815   EOSABS 0.1 02/25/2015 1222   EOSABS 0.1 07/30/2009 1815   BASOSABS 0.0 02/25/2015 1222   BASOSABS 0.0 07/30/2009 1815     . CMP Latest Ref Rng 02/25/2015 12/29/2014 06/27/2014  Glucose 70 - 140 mg/dl 82 127(H) 115(H)  BUN 7.0 - 26.0 mg/dL 15.'8 18 19  '$ Creatinine 0.7 - 1.3 mg/dL 1.2 1.30  1.19  Sodium 136 - 145 mEq/L 143 143 139  Potassium 3.5 - 5.1 mEq/L 4.4 4.3 4.1  Chloride 96 - 112 mEq/L - 108 104  CO2 22 - 29 mEq/L 31(H) 32 32  Calcium 8.4 - 10.4 mg/dL 9.0 9.3 9.3  Total Protein 6.4 - 8.3 g/dL 6.5 6.1 6.3  Total Bilirubin 0.20 - 1.20 mg/dL 0.97 0.8 0.7  Alkaline Phos 40 - 150 U/L 46 41 53  AST 5 - 34 U/L '23 17 21  '$ ALT 0 - 55 U/L 36 27 26   Component     Latest Ref Rng 02/25/2015  Vitamin B12     211 - 946 pg/mL 553  Copper     72 - 166 ug/dL 97  HIV     Non Reactive Non Reactive  Hep C Virus Ab     0.0 - 0.9 s/co ratio <0.1    RADIOGRAPHIC STUDIES: I have personally reviewed the radiological images as listed and agreed with the findings in the report. No results found.  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  No overt recent laboratory infections or new medications.  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 Noted to be an immune component to it due to his allergies. No issues with frequent infections B-12, copper, HIV, hepatitis C within normal limits  #2 mild thrombocytopenia platelet count 123k .  No issues with  bleeding or easy bruising.  Appears to have improved from 109k in December 2016.  Possibly related to his allergies and ?immune component Plan -We'll check peripheral blood smear -counseled on avoiding alcohol -In any concerns for abnormal lymphocytes on peripheral blood might need to consider flow cytometry. -none indications for G-CSF at this time.  Return to care with Dr. Irene Limbo in 3 months with repeat CBC and CMP and additional labs based on peripheral blood smear findings.   All of the patients questions were answered with apparent satisfaction. The patient knows to call the clinic with any problems, questions or concerns.  I spent 45 minutes counseling the patient face to face. The total time spent in the appointment was 45 minutes and more than 50% was on counseling and direct patient cares.    Sullivan Lone MD [place] AAHIVMS [facility] Hematology/Oncology Physician [facility]  (Office):       [phone] (Work cell):  [phone] (Fax):           [phone]  02/25/2015 11:41 AM

## 2015-02-25 NOTE — Telephone Encounter (Signed)
per of to sch pt appt-sent back to lab-gave avs

## 2015-02-26 LAB — HIV ANTIBODY (ROUTINE TESTING W REFLEX): HIV Screen 4th Generation wRfx: NONREACTIVE

## 2015-02-26 LAB — HEPATITIS C ANTIBODY: Hep C Virus Ab: <0.1 s/co ratio (ref 0.0–0.9)

## 2015-02-26 LAB — VITAMIN B12: Vitamin B12: 553 pg/mL (ref 211–946)

## 2015-02-27 LAB — COPPER, SERUM: COPPER: 97 ug/dL (ref 72–166)

## 2015-03-17 ENCOUNTER — Encounter: Payer: Self-pay | Admitting: Hematology

## 2015-03-18 ENCOUNTER — Telehealth: Payer: Self-pay | Admitting: Hematology

## 2015-03-18 NOTE — Telephone Encounter (Signed)
s.w pt and advised on May appt.....pt ok and aware °

## 2015-03-27 ENCOUNTER — Telehealth: Payer: Self-pay | Admitting: *Deleted

## 2015-03-27 NOTE — Telephone Encounter (Signed)
"  I received a survey to complete and I am not filling this out until I receive a call from Dr. Irene Limbo.  Called about an appointment May 3rd.  I am not a [place] pig.  I called over a week ago because I have questions. Spoke with his nurse and was told I would receive a call by the end of the day and have not received a call.  I felt uncomfortable there seeing what other patients look like.  I was told it is normal for African Americans to have low blood counts but I have questions.  Do I have cancer?  I have a 54 dollar co-pay and need to make sure I'm getting my money's worth.  Return number [phone]."   With this call this nurse advised that his labs are stable and the follow up is to monitor labs for any shift in the levels.  Would still like to talk with MD.

## 2015-04-28 ENCOUNTER — Other Ambulatory Visit (INDEPENDENT_AMBULATORY_CARE_PROVIDER_SITE_OTHER): Payer: 59

## 2015-04-28 ENCOUNTER — Other Ambulatory Visit: Payer: 59

## 2015-04-28 DIAGNOSIS — E119 Type 2 diabetes mellitus without complications: Secondary | ICD-10-CM | POA: Diagnosis not present

## 2015-04-28 LAB — MICROALBUMIN / CREATININE URINE RATIO
Creatinine,U: 168.7 mg/dL
Microalb Creat Ratio: 0.4 mg/g (ref 0.0–30.0)
Microalb, Ur: <0.7 mg/dL (ref 0.0–1.9)

## 2015-04-28 LAB — BASIC METABOLIC PANEL
BUN: 18 mg/dL (ref 6–23)
CO2: 31 meq/L (ref 19–32)
Calcium: 9.3 mg/dL (ref 8.4–10.5)
Chloride: 103 mEq/L (ref 96–112)
Creatinine, Ser: 1.13 mg/dL (ref 0.40–1.50)
GFR: 84.91 mL/min (ref >60.00)
GLUCOSE: 91 mg/dL (ref 70–99)
POTASSIUM: 3.5 meq/L (ref 3.5–5.1)
SODIUM: 139 meq/L (ref 135–145)

## 2015-04-28 LAB — HEMOGLOBIN A1C: Hgb A1c MFr Bld: 6.2 % (ref 4.6–6.5)

## 2015-04-28 LAB — LIPID PANEL
CHOL/HDL RATIO: 2
Cholesterol: 173 mg/dL (ref 0–200)
HDL: 74.3 mg/dL (ref >39.00)
LDL Cholesterol: 87 mg/dL (ref 0–99)
NONHDL: 98.61
Triglycerides: 59 mg/dL (ref 0.0–149.0)
VLDL: 11.8 mg/dL (ref 0.0–40.0)

## 2015-05-01 ENCOUNTER — Ambulatory Visit: Payer: 59 | Admitting: Endocrinology

## 2015-05-13 ENCOUNTER — Other Ambulatory Visit: Payer: 59

## 2015-05-13 ENCOUNTER — Other Ambulatory Visit: Payer: Self-pay | Admitting: *Deleted

## 2015-05-13 ENCOUNTER — Ambulatory Visit: Payer: 59 | Admitting: Hematology

## 2015-05-13 DIAGNOSIS — D72819 Decreased white blood cell count, unspecified: Secondary | ICD-10-CM

## 2015-05-21 ENCOUNTER — Other Ambulatory Visit: Payer: Self-pay | Admitting: Endocrinology

## 2015-06-23 ENCOUNTER — Other Ambulatory Visit: Payer: Self-pay | Admitting: Endocrinology

## 2015-06-30 ENCOUNTER — Ambulatory Visit: Payer: 59 | Admitting: Endocrinology

## 2015-07-03 ENCOUNTER — Telehealth: Payer: Self-pay | Admitting: Endocrinology

## 2015-07-03 NOTE — Telephone Encounter (Signed)
I contacted the pt and advised of note below. Pt declined to make an appointment at this time and stated he would call back on Monday to schedule his appt.

## 2015-07-03 NOTE — Telephone Encounter (Signed)
Patient was due back for DM OV in late April. Do you want to refill?

## 2015-07-03 NOTE — Telephone Encounter (Signed)
See note below. Medication being requested is not on current list. Thanks!

## 2015-07-03 NOTE — Telephone Encounter (Signed)
Prescription will be given when the patient comes in for office visit

## 2015-07-03 NOTE — Telephone Encounter (Signed)
Patient need a prescription for  Sildenafil 20 mg 30 quantity  send to [place] fax # [phone] Phone # [phone]

## 2015-07-09 ENCOUNTER — Ambulatory Visit (INDEPENDENT_AMBULATORY_CARE_PROVIDER_SITE_OTHER): Payer: Self-pay | Admitting: Podiatry

## 2015-07-09 ENCOUNTER — Ambulatory Visit (INDEPENDENT_AMBULATORY_CARE_PROVIDER_SITE_OTHER): Payer: Self-pay | Admitting: Endocrinology

## 2015-07-09 ENCOUNTER — Encounter: Payer: Self-pay | Admitting: Podiatry

## 2015-07-09 ENCOUNTER — Encounter: Payer: Self-pay | Admitting: Endocrinology

## 2015-07-09 VITALS — BP 112/76 | HR 91 | Ht 75.0 in | Wt 203.0 lb

## 2015-07-09 DIAGNOSIS — E1151 Type 2 diabetes mellitus with diabetic peripheral angiopathy without gangrene: Secondary | ICD-10-CM

## 2015-07-09 DIAGNOSIS — N528 Other male erectile dysfunction: Secondary | ICD-10-CM

## 2015-07-09 DIAGNOSIS — E119 Type 2 diabetes mellitus without complications: Secondary | ICD-10-CM

## 2015-07-09 DIAGNOSIS — B351 Tinea unguium: Secondary | ICD-10-CM

## 2015-07-09 DIAGNOSIS — N529 Male erectile dysfunction, unspecified: Secondary | ICD-10-CM

## 2015-07-09 DIAGNOSIS — M79673 Pain in unspecified foot: Secondary | ICD-10-CM

## 2015-07-09 DIAGNOSIS — I10 Essential (primary) hypertension: Secondary | ICD-10-CM

## 2015-07-09 MED ORDER — POTASSIUM CHLORIDE ER 10 MEQ PO TBCR
10.0000 meq | EXTENDED_RELEASE_TABLET | Freq: Every day | ORAL | Status: DC
Start: 2015-07-09 — End: 2016-07-15

## 2015-07-09 MED ORDER — SILDENAFIL CITRATE 20 MG PO TABS: ORAL_TABLET | ORAL | Status: DC

## 2015-07-09 NOTE — Progress Notes (Signed)
Subjective:     Patient ID: Cameron Cardenas, male   DOB: March 30, 1955, 60 y.o.   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

## 2015-07-09 NOTE — Progress Notes (Signed)
Patient ID: Cameron Cardenas, male   DOB: [birth year], 60 y.o.   MRN: [id number]   Reason for Appointment: Diabetes follow-up   History of Present Illness   Diagnosis: Type 2 DIABETES MELITUS, date of diagnosis: 2004  PAST history: He had mild diabetes at onset and was treated with metformin and subsequently Actoplusmet In 2013 he had changed his diet significantly and was exercising. This helped him with weight loss Subsequently his blood sugars have been excellent with A1c upper normal  RECENT history:  Oral hypoglycemic drugs: Actoplusmet 15/500 twice a day   He is returning for his 6 month follow-up today His blood sugars are still controlled recent A1c 6.2, this was done in April He is compliant with his taking taking Actoplusmet twice a day.   Current management, problems and blood sugar patterns:  He did not bring his monitor for download today  His lab glucose is normal at 91  relatively high at 127  He has been doing somewhat better with watching his diet and has lost a little weight  However not exercising much except on his days off twice a week             Side effects from medications: None  Monitors blood glucose very infrequently: 87 recently.  He does not remember any high readings  Physical activity: exercise: walking 2/7       Dietician visit: Most recent: 43/32           Complications: are: None, has normal microalbumin    Wt Readings from Last 3 Encounters:  07/09/15 203 lb (92.08 kg)  02/25/15 205 lb 9.6 oz (93.26 kg)  01/01/15 209 lb 3.2 oz (94.892 kg)   Lab Results  Component Value Date   HGBA1C 6.2 04/28/2015   HGBA1C 5.8 12/29/2014   HGBA1C 5.7 06/27/2014   Lab Results  Component Value Date   MICROALBUR <0.7 04/28/2015   [place] 87 04/28/2015   CREATININE 1.13 04/28/2015      Medication List       This list is accurate as of: 07/09/15  4:23 PM.  Always use your most recent med list.               aspirin 81 MG  tablet  Take 81 mg by mouth daily.     atorvastatin 80 MG tablet  Commonly known as:  LIPITOR  Take 1 tablet by mouth  daily     cetirizine 10 MG tablet  Commonly known as:  ZYRTEC  Take 10 mg by mouth daily.     HYDROcodone-acetaminophen 10-325 MG tablet  Commonly known as:  NORCO  Take 1-2 tablets by mouth every 4 (four) hours as needed for moderate pain or severe pain.     lisinopril-hydrochlorothiazide 10-12.5 MG tablet  Commonly known as:  PRINZIDE,ZESTORETIC  TAKE ONE TABLET BY MOUTH ONCE DAILY     pioglitazone-metformin 15-500 MG tablet  Commonly known as:  ACTOPLUS MET  TAKE ONE TABLET BY MOUTH TWICE DAILY        Allergies:  Allergies  Allergen Reactions  . Percocet [Oxycodone-Acetaminophen] Other (See Comments)    Caused hallucinations    Past Medical History  Diagnosis Date  . Lumbago   . Essential hypertension, malignant   . Pure hypercholesterolemia   . Allergic rhinitis due to pollen   . Unspecified vitamin D deficiency   . Type II or unspecified type diabetes mellitus without mention of complication, not stated as uncontrolled  dx in 2005    Past Surgical History  Procedure Laterality Date  . Lumbar disc surgery  2011  . Colonoscopy      in [facility], MD no longer in practice, does not recall the name of facility. Does belive polyps were removed  . Maximum access (mas)posterior lumbar interbody fusion (plif) 1 level N/A 06/10/2014    Procedure: Redo Lumbar Five-Sacral One Decompression with maximum access posterior lumbar interbody fusion;  Surgeon: Erline Levine, MD;  Location: [place] [facility];  Service: Neurosurgery;  Laterality: N/A;  Redo Decompression with maximum access posterior lumbar interbody fusion, L5-S1    Family History  Problem Relation Age of Onset  . Colon cancer Neg Hx   . Esophageal cancer Neg Hx   . Rectal cancer Neg Hx   . Stomach cancer Neg Hx   . Heart disease Neg Hx   . Hypertension Mother   . Hypertension Father   . Diabetes  Sister     Social History:  reports that he quit smoking about 22 years ago. He has never used smokeless tobacco. He reports that he drinks alcohol. He reports that he uses illicit drugs (Marijuana).   No visits with results within 1 Week(s) from this visit. Latest known visit with results is:  Lab on 04/28/2015  Component Date Value Ref Range Status  . Hgb A1c MFr Bld 04/28/2015 6.2  4.6 - 6.5 % Final   Glycemic Control Guidelines for People with Diabetes:Non Diabetic:  <6%Goal of Therapy: <7%Additional Action Suggested:  >8%   . Sodium 04/28/2015 139  135 - 145 mEq/L Final  . Potassium 04/28/2015 3.5  3.5 - 5.1 mEq/L Final  . Chloride 04/28/2015 103  96 - 112 mEq/L Final  . CO2 04/28/2015 31  19 - 32 mEq/L Final  . Glucose, Bld 04/28/2015 91  70 - 99 mg/dL Final  . BUN 04/28/2015 18  6 - 23 mg/dL Final  . Creatinine, Ser 04/28/2015 1.13  0.40 - 1.50 mg/dL Final  . Calcium 04/28/2015 9.3  8.4 - 10.5 mg/dL Final  . GFR 04/28/2015 84.91  >60.00 mL/min Final  . Cholesterol 04/28/2015 173  0 - 200 mg/dL Final   ATP III Classification       Desirable:  < 200 mg/dL               Borderline High:  200 - 239 mg/dL          High:  > = 240 mg/dL  . Triglycerides 04/28/2015 59.0  0.0 - 149.0 mg/dL Final   Normal:  <150 mg/dLBorderline High:  150 - 199 mg/dL  . HDL 04/28/2015 74.30  >39.00 mg/dL Final  . VLDL 04/28/2015 11.8  0.0 - 40.0 mg/dL Final  . LDL Cholesterol 04/28/2015 87  0 - 99 mg/dL Final  . Total CHOL/HDL Ratio 04/28/2015 2   Final                  Men          Women1/2 Average Risk     3.4          3.3Average Risk          5.0          4.42X Average Risk          9.6          7.13X Average Risk          15.0          11.0                      .  NonHDL 04/28/2015 98.61   Final   NOTE:  Non-HDL goal should be 30 mg/dL higher than patient's LDL goal (i.e. LDL goal of < 70 mg/dL, would have non-HDL goal of < 100 mg/dL)  . Microalb, Ur 04/28/2015 <0.7  0.0 - 1.9 mg/dL Final  .  Creatinine,U 04/28/2015 168.7   Final  . Microalb Creat Ratio 04/28/2015 0.4  0.0 - 30.0 mg/g Final    Review of Systems:  HYPERTENSION:   controlled with Zestoretic 10/12.5  HYPERLIPIDEMIA: The lipid abnormality consists of elevated LDL which was 94 previously Has been on 40 mg atorvastatin since 12/13 and this was increased to 80 mg in 6/16 However he did have LDL particle number of 1348 previously and which was relatively high at 1216 in 6/16   Lab Results  Component Value Date   CHOL 173 04/28/2015   HDL 74.30 04/28/2015   LDLCALC 87 04/28/2015   TRIG 59.0 04/28/2015   CHOLHDL 2 04/28/2015      He had a foot exam in 12/16   Examination:   BP 122/84 mmHg  Pulse 91  Ht '6\' 3"'$  (1.905 m)  Wt 203 lb (92.08 kg)  BMI 25.37 kg/m2  SpO2 96%  Body mass index is 25.37 kg/(m^2).   Repeat blood pressure was normal, initially high No ankle edema present  ASSESSMENT/ PLAN:   Diabetes type 2 with BMI 25  He has had mild diabetes with good control of Actoplusmet 15/500 twice a day His A1c is upper normal and fairly consistent His weight is slightly better than on his last visit. However he is not exercising and he does need to keep losing weight He will try to be consistent with diet also  Reminded him  to check his blood sugars periodically including after meals   HYPERLIPIDEMIA with increased LDL particle number in the past, LDL is controlled at 87   Hypertension: Now controlled with Zestoretic  Najia Hurlbutt 07/09/2015, 4:23 PM

## 2015-07-23 ENCOUNTER — Telehealth: Payer: Self-pay | Admitting: Internal Medicine

## 2015-07-23 NOTE — Telephone Encounter (Signed)
Got scheduled  °

## 2015-07-23 NOTE — Telephone Encounter (Signed)
yes

## 2015-07-23 NOTE — Telephone Encounter (Signed)
Patient would like to know if he could establish care with Dr. Ronnald Ramp? Pt has bcbs waiting on ins card

## 2015-09-03 ENCOUNTER — Encounter: Payer: Self-pay | Admitting: Internal Medicine

## 2015-09-03 ENCOUNTER — Ambulatory Visit (INDEPENDENT_AMBULATORY_CARE_PROVIDER_SITE_OTHER): Payer: BLUE CROSS/BLUE SHIELD | Admitting: Internal Medicine

## 2015-09-03 VITALS — BP 118/80 | HR 74 | Temp 98.1°F | Ht 75.0 in | Wt 205.5 lb

## 2015-09-03 DIAGNOSIS — I10 Essential (primary) hypertension: Secondary | ICD-10-CM | POA: Diagnosis not present

## 2015-09-03 DIAGNOSIS — E118 Type 2 diabetes mellitus with unspecified complications: Secondary | ICD-10-CM

## 2015-09-03 DIAGNOSIS — E785 Hyperlipidemia, unspecified: Secondary | ICD-10-CM

## 2015-09-03 DIAGNOSIS — N528 Other male erectile dysfunction: Secondary | ICD-10-CM | POA: Diagnosis not present

## 2015-09-03 DIAGNOSIS — Z23 Encounter for immunization: Secondary | ICD-10-CM | POA: Diagnosis not present

## 2015-09-03 DIAGNOSIS — N529 Male erectile dysfunction, unspecified: Secondary | ICD-10-CM

## 2015-09-03 DIAGNOSIS — J301 Allergic rhinitis due to pollen: Secondary | ICD-10-CM | POA: Insufficient documentation

## 2015-09-03 MED ORDER — FLUTICASONE PROPIONATE 50 MCG/ACT NA SUSP: 2.0000 | Freq: Every day | NASAL | 11 refills | Status: DC

## 2015-09-03 MED ORDER — FLUTICASONE PROPIONATE 50 MCG/ACT NA SUSP
2.0000 | Freq: Every day | NASAL | 11 refills | Status: DC
Start: 2015-09-03 — End: 2015-09-03

## 2015-09-03 MED ORDER — SILDENAFIL CITRATE 100 MG PO TABS: 50.0000 mg | ORAL_TABLET | Freq: Every day | ORAL | 11 refills | Status: DC | PRN

## 2015-09-03 NOTE — Patient Instructions (Signed)
Allergic Rhinitis Allergic rhinitis is when the mucous membranes in the nose respond to allergens. Allergens are particles in the air that cause your body to have an allergic reaction. This causes you to release allergic antibodies. Through a chain of events, these eventually cause you to release histamine into the blood stream. Although meant to protect the body, it is this release of histamine that causes your discomfort, such as frequent sneezing, congestion, and an itchy, runny nose.  CAUSES Seasonal allergic rhinitis (hay fever) is caused by pollen allergens that may come from grasses, trees, and weeds. Year-round allergic rhinitis (perennial allergic rhinitis) is caused by allergens such as house dust mites, pet dander, and mold spores. SYMPTOMS  Nasal stuffiness (congestion).  Itchy, runny nose with sneezing and tearing of the eyes. DIAGNOSIS Your health care provider can help you determine the allergen or allergens that trigger your symptoms. If you and your health care provider are unable to determine the allergen, skin or blood testing may be used. Your health care provider will diagnose your condition after taking your health history and performing a physical exam. Your health care provider may assess you for other related conditions, such as asthma, pink eye, or an ear infection. TREATMENT Allergic rhinitis does not have a cure, but it can be controlled by:  Medicines that block allergy symptoms. These may include allergy shots, nasal sprays, and oral antihistamines.  Avoiding the allergen. Hay fever may often be treated with antihistamines in pill or nasal spray forms. Antihistamines block the effects of histamine. There are over-the-counter medicines that may help with nasal congestion and swelling around the eyes. Check with your health care provider before taking or giving this medicine. If avoiding the allergen or the medicine prescribed do not work, there are many new medicines  your health care provider can prescribe. Stronger medicine may be used if initial measures are ineffective. Desensitizing injections can be used if medicine and avoidance does not work. Desensitization is when a patient is given ongoing shots until the body becomes less sensitive to the allergen. Make sure you follow up with your health care provider if problems continue. HOME CARE INSTRUCTIONS It is not possible to completely avoid allergens, but you can reduce your symptoms by taking steps to limit your exposure to them. It helps to know exactly what you are allergic to so that you can avoid your specific triggers. SEEK MEDICAL CARE IF:  You have a fever.  You develop a cough that does not stop easily (persistent).  You have shortness of breath.  You start wheezing.  Symptoms interfere with normal daily activities.   This information is not intended to replace advice given to you by your health care provider. Make sure you discuss any questions you have with your health care provider.   Document Released: 09/21/2000 Document Revised: 01/17/2014 Document Reviewed: 09/03/2012 Elsevier Interactive Patient Education 2016 [employer]  

## 2015-09-03 NOTE — Progress Notes (Signed)
Pre visit review using our clinic review tool, if applicable. No additional management support is needed unless otherwise documented below in the visit note. 

## 2015-09-03 NOTE — Progress Notes (Signed)
Subjective:  Patient ID: Cameron Cardenas, male    DOB: 01/30/1955  Age: 60 y.o. MRN: [id number]  CC: Allergic Rhinitis ; Hypertension; and Diabetes   HPI Cameron Cardenas presents for establishing with a new PCP. He tells me he had a physical about 4 months ago. He is followed closely by endocrinology for diabetes.   His only complaint today is an intermittent runny nose for years. He states the runny nose starts in the spring with pollen and then lasts throughout the year. He has tried Zyrtec but has not gotten much symptom relief.  He tells me his blood pressure is well-controlled the combination of lisinopril and hydrochlorothiazide. He's had no recent episodes of headache/blurred vision/chest pain/shortness of breath/palpitations/edema/fatigue.  History Tank has a past medical history of Allergic rhinitis due to pollen; Essential hypertension, malignant; Lumbago; Pure hypercholesterolemia; Type II or unspecified type diabetes mellitus without mention of complication, not stated as uncontrolled; and Unspecified vitamin D deficiency.   He has a past surgical history that includes Lumbar disc surgery (2011); Colonoscopy; and Maximum access (mas)posterior lumbar interbody fusion (plif) 1 level (N/A, 06/10/2014).   His family history includes Diabetes in his sister; Hypertension in his father and mother.He reports that he quit smoking about 23 years ago. He has never used smokeless tobacco. He reports that he drinks alcohol. He reports that he uses drugs, including Marijuana.  Outpatient Medications Prior to Visit  Medication Sig Dispense Refill  . aspirin 81 MG tablet Take 81 mg by mouth daily.    . cetirizine (ZYRTEC) 10 MG tablet Take 10 mg by mouth daily.    Marland Kitchen lisinopril-hydrochlorothiazide (PRINZIDE,ZESTORETIC) 10-12.5 MG tablet TAKE ONE TABLET BY MOUTH ONCE DAILY 90 tablet 0  . pioglitazone-metformin (ACTOPLUS MET) 15-500 MG tablet TAKE ONE TABLET BY MOUTH TWICE DAILY 180 tablet 0    . potassium chloride (K-DUR) 10 MEQ tablet Take 1 tablet (10 mEq total) by mouth daily. 30 tablet 1  . atorvastatin (LIPITOR) 80 MG tablet Take 1 tablet by mouth  daily 90 tablet 1  . HYDROcodone-acetaminophen (NORCO) 10-325 MG per tablet Take 1-2 tablets by mouth every 4 (four) hours as needed for moderate pain or severe pain. 100 tablet 0  . sildenafil (REVATIO) 20 MG tablet As directed 30 tablet 0   No facility-administered medications prior to visit.     ROS Review of Systems  Constitutional: Negative.  Negative for appetite change, chills, fatigue and fever.  HENT: Positive for postnasal drip and rhinorrhea. Negative for congestion, nosebleeds, sinus pressure, sneezing, sore throat, trouble swallowing and voice change.   Eyes: Negative.   Respiratory: Negative.  Negative for cough, choking, chest tightness, shortness of breath and stridor.   Cardiovascular: Negative.  Negative for chest pain, palpitations and leg swelling.  Gastrointestinal: Negative.  Negative for abdominal pain, constipation, diarrhea, nausea and vomiting.  Endocrine: Negative.   Genitourinary: Negative.  Negative for difficulty urinating.       +ED  Musculoskeletal: Negative.  Negative for arthralgias, back pain, myalgias and neck pain.  Skin: Negative.  Negative for color change and rash.  Allergic/Immunologic: Negative.   Neurological: Negative.  Negative for dizziness, tremors, weakness and headaches.  Hematological: Negative.  Negative for adenopathy. Does not bruise/bleed easily.  Psychiatric/Behavioral: Negative.     Objective:  BP 118/80 (BP Location: Left Arm, Patient Position: Sitting, Cuff Size: Normal)   Pulse 74   Temp 98.1 F (36.7 C) (Oral)   Ht '6\' 3"'$  (1.905 m)   Wt  205 lb 8 oz (93.2 kg)   SpO2 97%   BMI 25.69 kg/m   Physical Exam  Constitutional: He is oriented to person, place, and time. No distress.  HENT:  Nose: Mucosal edema and rhinorrhea present. No sinus tenderness. No  epistaxis. Right sinus exhibits no maxillary sinus tenderness and no frontal sinus tenderness. Left sinus exhibits no maxillary sinus tenderness and no frontal sinus tenderness.  Mouth/Throat: Oropharynx is clear and moist and mucous membranes are normal. Mucous membranes are not pale, not dry and not cyanotic. No oral lesions. No trismus in the jaw. No uvula swelling. No oropharyngeal exudate, posterior oropharyngeal edema, posterior oropharyngeal erythema or tonsillar abscesses.  Eyes: Conjunctivae are normal. Right eye exhibits no discharge. Left eye exhibits no discharge. No scleral icterus.  Neck: Normal range of motion. Neck supple. No JVD present. No tracheal deviation present. No thyromegaly present.  Cardiovascular: Normal rate, regular rhythm, normal heart sounds and intact distal pulses.  Exam reveals no gallop and no friction rub.   No murmur heard. Pulmonary/Chest: Effort normal and breath sounds normal. No stridor. No respiratory distress. He has no wheezes. He has no rales. He exhibits no tenderness.  Abdominal: Soft. Bowel sounds are normal. He exhibits no distension and no mass. There is no tenderness. There is no rebound and no guarding.  Musculoskeletal: Normal range of motion. He exhibits no edema, tenderness or deformity.  Lymphadenopathy:    He has no cervical adenopathy.  Neurological: He is oriented to person, place, and time.  Skin: Skin is warm and dry. No rash noted. He is not diaphoretic. No erythema. No pallor.  Vitals reviewed.   Lab Results  Component Value Date   WBC 2.6 (L) 02/25/2015   HGB 14.4 02/25/2015   HCT 42.8 02/25/2015   PLT 123 Large platelets present (L) 02/25/2015   GLUCOSE 91 04/28/2015   CHOL 173 04/28/2015   TRIG 59.0 04/28/2015   HDL 74.30 04/28/2015   LDLCALC 87 04/28/2015   ALT 36 02/25/2015   AST 23 02/25/2015   NA 139 04/28/2015   K 3.5 04/28/2015   CL 103 04/28/2015   CREATININE 1.13 04/28/2015   BUN 18 04/28/2015   CO2 31  04/28/2015   INR 1.14 07/30/2009   HGBA1C 6.2 04/28/2015   MICROALBUR <0.7 04/28/2015    Assessment & Plan:   Cameron Cardenas was seen today for allergic rhinitis , hypertension and diabetes.  Diagnoses and all orders for this visit:  Type 2 diabetes mellitus with complication, without long-term current use of insulin (HCC)- his blood sugars are well-controlled on the 2 current oral agents he takes.  Essential hypertension- his blood pressures adequately well-controlled, recent electrolytes and renal function were normal.  Hyperlipidemia with target LDL less than 100- he has achieved his LDL goal and is doing well on the statin.  Need for prophylactic vaccination and inoculation against influenza -     Flu Vaccine QUAD 36+ mos IM  ED (erectile dysfunction) of organic origin -     sildenafil (VIAGRA) 100 MG tablet; Take 0.5-1 tablets (50-100 mg total) by mouth daily as needed for erectile dysfunction.  Allergic rhinitis due to pollen -     Discontinue: fluticasone (FLONASE) 50 MCG/ACT nasal spray; Place 2 sprays into both nostrils daily.  Seasonal allergic rhinitis due to pollen -     fluticasone (FLONASE) 50 MCG/ACT nasal spray; Place 2 sprays into both nostrils daily.   I have discontinued Mr. Manganiello's HYDROcodone-acetaminophen and sildenafil. I am also having  him start on sildenafil. Additionally, I am having him maintain his aspirin, cetirizine, pioglitazone-metformin, lisinopril-hydrochlorothiazide, potassium chloride, metFORMIN, triamterene, atorvastatin, gabapentin, and fluticasone.  Meds ordered this encounter  Medications  . metFORMIN (GLUCOPHAGE) 1000 MG tablet    Sig: 1,000 mg.  . triamterene (DYRENIUM) 100 MG capsule    Sig: 100 mg.  . atorvastatin (LIPITOR) 40 MG tablet    Refill:  0  . gabapentin (NEURONTIN) 300 MG capsule    Refill:  0  . DISCONTD: fluticasone (FLONASE) 50 MCG/ACT nasal spray    Sig: Place 2 sprays into both nostrils daily.    Dispense:  16 g     Refill:  11  . sildenafil (VIAGRA) 100 MG tablet    Sig: Take 0.5-1 tablets (50-100 mg total) by mouth daily as needed for erectile dysfunction.    Dispense:  6 tablet    Refill:  11  . fluticasone (FLONASE) 50 MCG/ACT nasal spray    Sig: Place 2 sprays into both nostrils daily.    Dispense:  16 g    Refill:  11     Follow-up: Return in about 4 months (around 01/03/2016).  Scarlette Calico, MD

## 2015-09-17 ENCOUNTER — Ambulatory Visit (INDEPENDENT_AMBULATORY_CARE_PROVIDER_SITE_OTHER): Payer: BLUE CROSS/BLUE SHIELD

## 2015-09-17 ENCOUNTER — Encounter: Payer: Self-pay | Admitting: Podiatry

## 2015-09-17 ENCOUNTER — Ambulatory Visit (INDEPENDENT_AMBULATORY_CARE_PROVIDER_SITE_OTHER): Payer: BLUE CROSS/BLUE SHIELD | Admitting: Podiatry

## 2015-09-17 VITALS — BP 126/84 | HR 76

## 2015-09-17 DIAGNOSIS — B351 Tinea unguium: Secondary | ICD-10-CM

## 2015-09-17 DIAGNOSIS — M79673 Pain in unspecified foot: Secondary | ICD-10-CM | POA: Diagnosis not present

## 2015-09-17 DIAGNOSIS — M79672 Pain in left foot: Secondary | ICD-10-CM

## 2015-09-17 DIAGNOSIS — R52 Pain, unspecified: Secondary | ICD-10-CM

## 2015-09-17 DIAGNOSIS — M722 Plantar fascial fibromatosis: Secondary | ICD-10-CM | POA: Diagnosis not present

## 2015-09-17 MED ORDER — TRIAMCINOLONE ACETONIDE 10 MG/ML IJ SUSP
10.0000 mg | Freq: Once | INTRAMUSCULAR | Status: AC
Start: 2015-09-17 — End: 2015-09-17
  Administered 2015-09-17: 10 mg

## 2015-09-17 NOTE — Patient Instructions (Signed)

## 2015-09-18 NOTE — Progress Notes (Signed)
Subjective:     Patient ID: Cameron Cardenas, male   DOB: 12-Apr-1955, 60 y.o.   MRN: [id number]  HPI patient presents with exquisite discomfort left plantar heel at the insertional point tendon into the calcaneus with also patient noted to have nail disease 1-5 both feet that are thick incurvated and painful with long-term history of diabetes   Review of Systems     Objective:   Physical Exam Neurovascular status intact muscle strength adequate with inflammatory changes plantar fascial band left at the insertional point tendon into the calcaneus with depression of the arch noted and patient noted to have severely thickened incurvated nailbeds 1-5 both feet that are painful with long-term diabetes    Assessment:     Acute plantar fasciitis left with chronic mycotic nail infection 1-5 both feet with diabetes as complicating factor    Plan:     H&P x-rays reviewed conditions discussed. At this time I injected the left plantar fascia 3 mg Kenalog 5 mg Xylocaine and applied fascial brace with instructions on usage. I debrided nailbeds 1-5 both feet with no iatrogenic bleeding and reappoint in 2 weeks for visit

## 2015-09-24 ENCOUNTER — Other Ambulatory Visit: Payer: Self-pay | Admitting: Endocrinology

## 2015-10-01 ENCOUNTER — Other Ambulatory Visit: Payer: Self-pay | Admitting: Endocrinology

## 2015-10-02 ENCOUNTER — Ambulatory Visit: Payer: BLUE CROSS/BLUE SHIELD | Admitting: Podiatry

## 2015-10-21 ENCOUNTER — Encounter: Payer: Self-pay | Admitting: Podiatry

## 2015-10-21 ENCOUNTER — Ambulatory Visit (INDEPENDENT_AMBULATORY_CARE_PROVIDER_SITE_OTHER): Payer: BLUE CROSS/BLUE SHIELD | Admitting: Podiatry

## 2015-10-21 DIAGNOSIS — M722 Plantar fascial fibromatosis: Secondary | ICD-10-CM | POA: Diagnosis not present

## 2015-10-21 MED ORDER — TRIAMCINOLONE ACETONIDE 10 MG/ML IJ SUSP: 10.0000 mg | Freq: Once | INTRAMUSCULAR | Status: DC

## 2015-10-21 NOTE — Progress Notes (Signed)
Subjective:     Patient ID: Cameron Cardenas, male   DOB: 1955/04/03, 60 y.o.   MRN: [id number]  HPI patient presents stating my left heel feels some better but still sore   Review of Systems     Objective:   Physical Exam Neurovascular status intact with discomfort plantar aspect left heel insertional point tendon into the calcaneus with inflammation and fluid around the medial band    Assessment:     Plantar fasciitis improved but still present    Plan:     Injected the left plantar fashion 3 mg Kenalog 5 mg Xylocaine advised on physical therapy continued orthotic usage and reappoint to recheck

## 2015-10-26 ENCOUNTER — Other Ambulatory Visit: Payer: Self-pay | Admitting: Endocrinology

## 2015-11-24 ENCOUNTER — Telehealth: Payer: Self-pay | Admitting: *Deleted

## 2015-11-24 NOTE — Telephone Encounter (Addendum)
Pt states he is being treated for a left heel pain and it is killing him and he would like a pain medication.11/25/2015-Dr. Regal ordered Vicodin 5/325mg  #30 one tablet every 6 hours prn foot pain. I informed pt of Dr. Mellody Drown orders for Hydrocodone and he would need to pick up in the [place] office. I offered pt an appt and he agreed, transferred pt to schedulers.

## 2015-11-25 MED ORDER — HYDROCODONE-ACETAMINOPHEN 5-325 MG PO TABS: 1.0000 | ORAL_TABLET | Freq: Four times a day (QID) | ORAL | 0 refills | Status: DC | PRN

## 2015-11-25 NOTE — Telephone Encounter (Signed)
vicodin 5/325  #30

## 2015-12-09 ENCOUNTER — Encounter: Payer: Self-pay | Admitting: Internal Medicine

## 2015-12-09 ENCOUNTER — Telehealth: Payer: Self-pay

## 2015-12-09 ENCOUNTER — Ambulatory Visit (INDEPENDENT_AMBULATORY_CARE_PROVIDER_SITE_OTHER): Payer: BLUE CROSS/BLUE SHIELD | Admitting: Internal Medicine

## 2015-12-09 VITALS — BP 104/72 | HR 80 | Temp 98.4°F | Resp 16 | Ht 75.0 in | Wt 209.0 lb

## 2015-12-09 DIAGNOSIS — J301 Allergic rhinitis due to pollen: Secondary | ICD-10-CM

## 2015-12-09 DIAGNOSIS — Z0000 Encounter for general adult medical examination without abnormal findings: Secondary | ICD-10-CM | POA: Insufficient documentation

## 2015-12-09 DIAGNOSIS — I10 Essential (primary) hypertension: Secondary | ICD-10-CM

## 2015-12-09 DIAGNOSIS — E118 Type 2 diabetes mellitus with unspecified complications: Secondary | ICD-10-CM

## 2015-12-09 DIAGNOSIS — E785 Hyperlipidemia, unspecified: Secondary | ICD-10-CM

## 2015-12-09 MED ORDER — FLUTICASONE PROPIONATE 50 MCG/ACT NA SUSP: 2.0000 | Freq: Every day | NASAL | 11 refills | Status: DC

## 2015-12-09 NOTE — Progress Notes (Signed)
Subjective:  Patient ID: Cameron Cardenas, male    DOB: [birth year]  Age: 60 y.o. MRN: [id number]  CC: Annual Exam; Hypertension; Hyperlipidemia; and Diabetes   HPI Cameron Cardenas presents for  - pt left in the middle of his visit "I have to get back to work"  Outpatient Medications Prior to Visit  Medication Sig Dispense Refill  . aspirin 81 MG tablet Take 81 mg by mouth daily.    Marland Kitchen atorvastatin (LIPITOR) 40 MG tablet TAKE ONE TABLET BY MOUTH ONCE DAILY 90 tablet 0  . cetirizine (ZYRTEC) 10 MG tablet Take 10 mg by mouth daily.    Marland Kitchen gabapentin (NEURONTIN) 300 MG capsule   0  . HYDROcodone-acetaminophen (NORCO/VICODIN) 5-325 MG tablet Take 1 tablet by mouth every 6 (six) hours as needed for moderate pain. 30 tablet 0  . lisinopril-hydrochlorothiazide (PRINZIDE,ZESTORETIC) 10-12.5 MG tablet TAKE ONE TABLET BY MOUTH ONCE DAILY 90 tablet 0  . metFORMIN (GLUCOPHAGE) 1000 MG tablet 1,000 mg.    . pioglitazone-metformin (ACTOPLUS MET) 15-500 MG tablet TAKE ONE TABLET BY MOUTH TWICE DAILY 180 tablet 0  . potassium chloride (K-DUR) 10 MEQ tablet Take 1 tablet (10 mEq total) by mouth daily. 30 tablet 1  . sildenafil (REVATIO) 20 MG tablet USE AS DIRECTED 30 tablet 3  . sildenafil (VIAGRA) 100 MG tablet Take 0.5-1 tablets (50-100 mg total) by mouth daily as needed for erectile dysfunction. 6 tablet 11  . triamterene (DYRENIUM) 100 MG capsule 100 mg.    . atorvastatin (LIPITOR) 40 MG tablet   0  . fluticasone (FLONASE) 50 MCG/ACT nasal spray Place 2 sprays into both nostrils daily. 16 g 11  . triamcinolone acetonide (KENALOG) 10 MG/ML injection 10 mg      No facility-administered medications prior to visit.     ROS Review of Systems  Objective:  BP 104/72 (BP Location: Left Arm, Patient Position: Sitting, Cuff Size: Large)   Pulse 80   Temp 98.4 F (36.9 C) (Oral)   Resp 16   Ht _0  (1.905 m)   Wt 209 lb (94.8 kg)   SpO2 96%   BMI 26.12 kg/m   BP Readings from Last 3 Encounters:   12/09/15 104/72  09/17/15 126/84  09/03/15 118/80    Wt Readings from Last 3 Encounters:  12/09/15 209 lb (94.8 kg)  09/03/15 205 lb 8 oz (93.2 kg)  07/09/15 203 lb (92.1 kg)    Physical Exam  Lab Results  Component Value Date   WBC 2.6 (L) 02/25/2015   HGB 14.4 02/25/2015   HCT 42.8 02/25/2015   PLT 123 Large platelets present (L) 02/25/2015   GLUCOSE 91 04/28/2015   CHOL 173 04/28/2015   TRIG 59.0 04/28/2015   HDL 74.30 04/28/2015   LDLCALC 87 04/28/2015   ALT 36 02/25/2015   AST 23 02/25/2015   NA 139 04/28/2015   K 3.5 04/28/2015   CL 103 04/28/2015   CREATININE 1.13 04/28/2015   BUN 18 04/28/2015   CO2 31 04/28/2015   INR 1.14 07/30/2009   HGBA1C 6.2 04/28/2015   MICROALBUR <0.7 04/28/2015    No results found.  Assessment & Plan:   Cameron Cardenas was seen today for annual exam, hypertension, hyperlipidemia and diabetes.  Diagnoses and all orders for this visit:  Acute nonseasonal allergic rhinitis due to pollen  Type 2 diabetes mellitus with complication, without long-term current use of insulin ([place]) -     Hemoglobin A1c; Future  Essential hypertension  Hyperlipidemia with  target LDL less than 100  Routine general medical examination at a health care facility -     Lipid panel; Future -     Comprehensive metabolic panel; Future -     CBC with Differential/Platelet; Future -     PSA; Future -     TSH; Future  Acute seasonal allergic rhinitis due to pollen -     fluticasone (FLONASE) 50 MCG/ACT nasal spray; Place 2 sprays into both nostrils daily.   I am having Cameron Cardenas maintain his aspirin, cetirizine, pioglitazone-metformin, potassium chloride, metFORMIN, triamterene, gabapentin, sildenafil, sildenafil, atorvastatin, lisinopril-hydrochlorothiazide, HYDROcodone-acetaminophen, and fluticasone. We will stop administering triamcinolone acetonide.  Meds ordered this encounter  Medications  . fluticasone (FLONASE) 50 MCG/ACT nasal spray    Sig: Place  2 sprays into both nostrils daily.    Dispense:  16 g    Refill:  11     Follow-up: No Follow-up on file.  Scarlette Calico, MD

## 2015-12-09 NOTE — Progress Notes (Signed)
Pre visit review using our clinic review tool, if applicable. No additional management support is needed unless otherwise documented below in the visit note. 

## 2015-12-09 NOTE — Telephone Encounter (Signed)
TJ informed me that he was not able to find pt after he was asked to change into a gown for his CPE.   I called pt and he stated that he was left in the room too long and that he had to get back to work. Pt stated he was unhappy and would filing a complaint.

## 2015-12-14 ENCOUNTER — Other Ambulatory Visit: Payer: Self-pay | Admitting: Endocrinology

## 2015-12-18 ENCOUNTER — Ambulatory Visit (INDEPENDENT_AMBULATORY_CARE_PROVIDER_SITE_OTHER): Payer: BLUE CROSS/BLUE SHIELD | Admitting: Podiatry

## 2015-12-18 DIAGNOSIS — M722 Plantar fascial fibromatosis: Secondary | ICD-10-CM

## 2015-12-18 DIAGNOSIS — E1151 Type 2 diabetes mellitus with diabetic peripheral angiopathy without gangrene: Secondary | ICD-10-CM

## 2015-12-18 DIAGNOSIS — B351 Tinea unguium: Secondary | ICD-10-CM | POA: Diagnosis not present

## 2015-12-18 MED ORDER — TRIAMCINOLONE ACETONIDE 10 MG/ML IJ SUSP
10.0000 mg | Freq: Once | INTRAMUSCULAR | Status: AC
  Administered 2015-12-18: 10 mg

## 2015-12-20 NOTE — Progress Notes (Signed)
Subjective:     Patient ID: Cameron Cardenas, male   DOB: Jul 25, 1955, 61 y.o.   MRN: [id number]  HPI patient states my left heel has still been really sore and also I'm going to [facility] towards the end of this month. Patient's concerned about be able to walk and also complains of nail disease 1-5 both feet   Review of Systems     Objective:   Physical Exam Neurovascular status intact with long-term diabetic who is in good control with exquisite discomfort plantar aspect left heel with depression of the arch and also noted to have thick yellow brittle nailbeds 1-5 both feet    Assessment:     Continued acute plantar fasciitis left very tender at the insertion along with nail disease and pain 1-5 both feet    Plan:     H&P conditions reviewed and at this point I did reinject the plantar fascial left 3 [place] Kenalog 5 mill grams Xylocaine and went ahead and dispensed a air fracture walker to completely reduce stress against the heel. I then debrided nailbeds 1-5 both feet with no iatrogenic bleeding noted

## 2015-12-27 ENCOUNTER — Other Ambulatory Visit: Payer: Self-pay | Admitting: Endocrinology

## 2015-12-27 DIAGNOSIS — E118 Type 2 diabetes mellitus with unspecified complications: Secondary | ICD-10-CM

## 2016-01-07 ENCOUNTER — Other Ambulatory Visit: Payer: PRIVATE HEALTH INSURANCE

## 2016-01-07 ENCOUNTER — Other Ambulatory Visit (INDEPENDENT_AMBULATORY_CARE_PROVIDER_SITE_OTHER): Payer: BLUE CROSS/BLUE SHIELD

## 2016-01-07 DIAGNOSIS — E118 Type 2 diabetes mellitus with unspecified complications: Secondary | ICD-10-CM | POA: Diagnosis not present

## 2016-01-07 LAB — COMPREHENSIVE METABOLIC PANEL
ALT: 23 U/L (ref 0–53)
AST: 19 U/L (ref 0–37)
Albumin: 4 g/dL (ref 3.5–5.2)
Alkaline Phosphatase: 42 U/L (ref 39–117)
BILIRUBIN TOTAL: 0.9 mg/dL (ref 0.2–1.2)
BUN: 15 mg/dL (ref 6–23)
CHLORIDE: 105 meq/L (ref 96–112)
CO2: 28 meq/L (ref 19–32)
Calcium: 8.7 mg/dL (ref 8.4–10.5)
Creatinine, Ser: 1.24 mg/dL (ref 0.40–1.50)
GFR: 76.1 mL/min (ref >60.00)
GLUCOSE: 144 mg/dL — AB (ref 70–99)
Potassium: 3.8 mEq/L (ref 3.5–5.1)
Sodium: 140 mEq/L (ref 135–145)
Total Protein: 6.2 g/dL (ref 6.0–8.3)

## 2016-01-07 LAB — HEMOGLOBIN A1C: HEMOGLOBIN A1C: 5.7 % (ref 4.6–6.5)

## 2016-01-08 ENCOUNTER — Encounter: Payer: Self-pay | Admitting: Podiatry

## 2016-01-08 ENCOUNTER — Ambulatory Visit (INDEPENDENT_AMBULATORY_CARE_PROVIDER_SITE_OTHER): Payer: BLUE CROSS/BLUE SHIELD | Admitting: Podiatry

## 2016-01-08 DIAGNOSIS — M722 Plantar fascial fibromatosis: Secondary | ICD-10-CM

## 2016-01-08 DIAGNOSIS — M79672 Pain in left foot: Secondary | ICD-10-CM

## 2016-01-08 MED ORDER — TRIAMCINOLONE ACETONIDE 10 MG/ML IJ SUSP
10.0000 mg | Freq: Once | INTRAMUSCULAR | Status: AC
  Administered 2016-01-08: 10 mg

## 2016-01-08 MED ORDER — DICLOFENAC SODIUM 75 MG PO TBEC: 75.0000 mg | DELAYED_RELEASE_TABLET | Freq: Two times a day (BID) | ORAL | 2 refills | Status: DC

## 2016-01-08 NOTE — Progress Notes (Signed)
Subjective:     Patient ID: Cameron Cardenas, male   DOB: September 26, 1955, 60 y.o.   MRN: [id number]  HPI patient states that the heel still bothers him and that when he is not wearing the boot it seems to get worse with the boot it seems to be okay but so far he's had trouble when he tries to take it off   Review of Systems     Objective:   Physical Exam Neurovascular status intact muscle strength adequate range of motion within normal limits with continued discomfort plantar aspect left heel at the insertional point of the tendon into the calcaneus    Assessment:     Plantar fasciitis left with inflammation fluid around the medial band    Plan:     Discussed that this ultimately could require surgical intervention but I want to continue to try conservatively and I reinjected the fascia 3 mg Kenalog 5 mg Xylocaine and continue boot and we will see back again in 1 month  and if symptoms are continuing to persist work and the need to consider surgical intervention

## 2016-01-12 ENCOUNTER — Telehealth: Payer: Self-pay | Admitting: *Deleted

## 2016-01-12 NOTE — Telephone Encounter (Addendum)
Pt states his medication is not at the [facility] on [address] and he tried a medication this weekend that helped, Meloxicam 7.5mg  and he wanted to know if that could be prescribed. 01/13/2016-Left message informing pt Dr. Paulla Dolly is ordering the Meloxicam 7.5mg  that pt states has worked for him, it was escribed for [facility].

## 2016-01-13 MED ORDER — MELOXICAM 7.5 MG PO TABS: 7.5000 mg | ORAL_TABLET | Freq: Every day | ORAL | 2 refills | Status: DC

## 2016-01-13 NOTE — Telephone Encounter (Signed)
That is fine 

## 2016-01-14 ENCOUNTER — Ambulatory Visit (INDEPENDENT_AMBULATORY_CARE_PROVIDER_SITE_OTHER): Payer: BLUE CROSS/BLUE SHIELD | Admitting: Endocrinology

## 2016-01-14 VITALS — BP 103/74 | HR 80 | Ht 75.79 in | Wt 211.6 lb

## 2016-01-14 DIAGNOSIS — E119 Type 2 diabetes mellitus without complications: Secondary | ICD-10-CM | POA: Diagnosis not present

## 2016-01-14 MED ORDER — SILDENAFIL CITRATE 20 MG PO TABS: ORAL_TABLET | ORAL | 3 refills | Status: DC

## 2016-01-14 MED ORDER — SILDENAFIL CITRATE 20 MG PO TABS: ORAL_TABLET | ORAL | 0 refills | Status: DC

## 2016-01-14 MED ORDER — ATORVASTATIN CALCIUM 80 MG PO TABS: 80.0000 mg | ORAL_TABLET | Freq: Every day | ORAL | 5 refills | Status: DC

## 2016-01-14 NOTE — Progress Notes (Signed)
Patient ID: Cameron Cardenas, male   DOB: 07/31/1955, 61 y.o.   MRN: [id number]   Reason for Appointment: Diabetes follow-up   History of Present Illness   Diagnosis: Type 2 DIABETES MELITUS, date of diagnosis: 2004  PAST history: He had mild diabetes at onset and was treated with metformin and subsequently Actoplusmet In 2013 he had changed his diet significantly and was exercising. This helped him with weight loss Subsequently his blood sugars have been excellent with A1c upper normal  RECENT history:  Oral hypoglycemic drugs: Actoplusmet 15/500 twice a day   He is returning for his 6 month follow-up today His blood sugars are controlled and A1c 5.7, previously 6.2 He is compliant with his Actoplusmet twice a day.   Current management, problems and blood sugar patterns:  He did not bring his monitor for download today and appears to be checking blood sugar very sporadically  He thinks his blood sugars may have gone up 20 points when he had Kenalog injection in his foot  He says that he is trying to eat a healthy diet and avoiding fried food but has gained weight.  Blood sugar after drinking a smoothie in the morning was 144  However not exercising partly because of having plantar fascitis             Side effects from medications: None  Monitors blood glucose very infrequently:, Highest about 112 fasting  Physical activity: exercise: walking 2/7       Dietician visit: Most recent: 12/13           Smoothies Complications: are: None, has normal microalbumin    Wt Readings from Last 3 Encounters:  01/14/16 211 lb 9.6 oz (96 kg)  12/09/15 209 lb (94.8 kg)  09/03/15 205 lb 8 oz (93.2 kg)   Lab Results  Component Value Date   HGBA1C 5.7 01/07/2016   HGBA1C 6.2 04/28/2015   HGBA1C 5.8 12/29/2014   Lab Results  Component Value Date   MICROALBUR <0.7 04/28/2015   LDLCALC 87 04/28/2015   CREATININE 1.24 01/07/2016    No visits with results within 1  Week(s) from this visit.  Latest known visit with results is:  Appointment on 01/07/2016  Component Date Value Ref Range Status  . Hgb A1c MFr Bld 01/07/2016 5.7  4.6 - 6.5 % Final  . Sodium 01/07/2016 140  135 - 145 mEq/L Final  . Potassium 01/07/2016 3.8  3.5 - 5.1 mEq/L Final  . Chloride 01/07/2016 105  96 - 112 mEq/L Final  . CO2 01/07/2016 28  19 - 32 mEq/L Final  . Glucose, Bld 01/07/2016 144* 70 - 99 mg/dL Final  . BUN 01/07/2016 15  6 - 23 mg/dL Final  . Creatinine, Ser 01/07/2016 1.24  0.40 - 1.50 mg/dL Final  . Total Bilirubin 01/07/2016 0.9  0.2 - 1.2 mg/dL Final  . Alkaline Phosphatase 01/07/2016 42  39 - 117 U/L Final  . AST 01/07/2016 19  0 - 37 U/L Final  . ALT 01/07/2016 23  0 - 53 U/L Final  . Total Protein 01/07/2016 6.2  6.0 - 8.3 g/dL Final  . Albumin 01/07/2016 4.0  3.5 - 5.2 g/dL Final  . Calcium 01/07/2016 8.7  8.4 - 10.5 mg/dL Final  . GFR 01/07/2016 76.10  >60.00 mL/min Final     Allergies as of 01/14/2016      Reactions   Percocet [oxycodone-acetaminophen] Other (See Comments)   Caused hallucinations  Medication List       Accurate as of 01/14/16  8:27 AM. Always use your most recent med list.          aspirin 81 MG tablet Take 81 mg by mouth daily.   atorvastatin 40 MG tablet Commonly known as:  LIPITOR TAKE ONE TABLET BY MOUTH ONCE DAILY   cetirizine 10 MG tablet Commonly known as:  ZYRTEC Take 10 mg by mouth daily.   fluticasone 50 MCG/ACT nasal spray Commonly known as:  FLONASE Place 2 sprays into both nostrils daily.   gabapentin 300 MG capsule Commonly known as:  NEURONTIN   HYDROcodone-acetaminophen 5-325 MG tablet Commonly known as:  NORCO/VICODIN Take 1 tablet by mouth every 6 (six) hours as needed for moderate pain.   lisinopril-hydrochlorothiazide 10-12.5 MG tablet Commonly known as:  PRINZIDE,ZESTORETIC TAKE ONE TABLET BY MOUTH ONCE DAILY   meloxicam 7.5 MG tablet Commonly known as:  MOBIC Take 1 tablet (7.5 mg  total) by mouth daily.   metFORMIN 1000 MG tablet Commonly known as:  GLUCOPHAGE 1,000 mg.   oxyCODONE-acetaminophen 10-325 MG tablet Commonly known as:  PERCOCET   pioglitazone-metformin 15-500 MG tablet Commonly known as:  ACTOPLUS MET TAKE ONE TABLET BY MOUTH TWICE DAILY   potassium chloride 10 MEQ tablet Commonly known as:  K-DUR Take 1 tablet (10 mEq total) by mouth daily.   sildenafil 100 MG tablet Commonly known as:  VIAGRA Take 0.5-1 tablets (50-100 mg total) by mouth daily as needed for erectile dysfunction.   sildenafil 20 MG tablet Commonly known as:  REVATIO USE AS DIRECTED   triamcinolone ointment 0.5 % Commonly known as:  KENALOG   triamterene 100 MG capsule Commonly known as:  DYRENIUM 100 mg.       Allergies:  Allergies  Allergen Reactions  . Percocet [Oxycodone-Acetaminophen] Other (See Comments)    Caused hallucinations    Past Medical History:  Diagnosis Date  . Allergic rhinitis due to pollen   . Essential hypertension, malignant   . Lumbago   . Pure hypercholesterolemia   . Type II or unspecified type diabetes mellitus without mention of complication, not stated as uncontrolled    dx in 2005  . Unspecified vitamin D deficiency     Past Surgical History:  Procedure Laterality Date  . COLONOSCOPY     in [facility], MD no longer in practice, does not recall the name of facility. Does belive polyps were removed  . LUMBAR DISC SURGERY  2011  . MAXIMUM ACCESS (MAS)POSTERIOR LUMBAR INTERBODY FUSION (PLIF) 1 LEVEL N/A 06/10/2014   Procedure: Redo Lumbar Five-Sacral One Decompression with maximum access posterior lumbar interbody fusion;  Surgeon: Maeola Harman, MD;  Location: [facility];  Service: Neurosurgery;  Laterality: N/A;  Redo Decompression with maximum access posterior lumbar interbody fusion, L5-S1    Family History  Problem Relation Age of Onset  . Hypertension Mother   . Hypertension Father   . Diabetes Sister   . Colon cancer Neg Hx    . Esophageal cancer Neg Hx   . Rectal cancer Neg Hx   . Stomach cancer Neg Hx   . Heart disease Neg Hx     Social History:  reports that he quit smoking about 23 years ago. He has never used smokeless tobacco. He reports that he drinks alcohol. He reports that he uses drugs, including Marijuana.   No visits with results within 1 Week(s) from this visit.  Latest known visit with results is:  Appointment on  01/07/2016  Component Date Value Ref Range Status  . Hgb A1c MFr Bld 01/07/2016 5.7  4.6 - 6.5 % Final  . Sodium 01/07/2016 140  135 - 145 mEq/L Final  . Potassium 01/07/2016 3.8  3.5 - 5.1 mEq/L Final  . Chloride 01/07/2016 105  96 - 112 mEq/L Final  . CO2 01/07/2016 28  19 - 32 mEq/L Final  . Glucose, Bld 01/07/2016 144* 70 - 99 mg/dL Final  . BUN 01/07/2016 15  6 - 23 mg/dL Final  . Creatinine, Ser 01/07/2016 1.24  0.40 - 1.50 mg/dL Final  . Total Bilirubin 01/07/2016 0.9  0.2 - 1.2 mg/dL Final  . Alkaline Phosphatase 01/07/2016 42  39 - 117 U/L Final  . AST 01/07/2016 19  0 - 37 U/L Final  . ALT 01/07/2016 23  0 - 53 U/L Final  . Total Protein 01/07/2016 6.2  6.0 - 8.3 g/dL Final  . Albumin 01/07/2016 4.0  3.5 - 5.2 g/dL Final  . Calcium 01/07/2016 8.7  8.4 - 10.5 mg/dL Final  . GFR 01/07/2016 76.10  >60.00 mL/min Final    Review of Systems:  HYPERTENSION:   controlled with Zestoretic 10/12.5  HYPERLIPIDEMIA: The lipid abnormality consists of elevated LDL which was 94 previously Has been on 40 mg atorvastatin since 12/13 and this was increased to 80 mg in 6/16, however has been getting the 40 mg prescription filled  However he did have LDL particle number of 1348 previously and which was relatively high at 1216 in 6/16   Lab Results  Component Value Date   CHOL 173 04/28/2015   HDL 74.30 04/28/2015   LDLCALC 87 04/28/2015   TRIG 59.0 04/28/2015   CHOLHDL 2 04/28/2015      He had a foot exam in 11/17  Alcohol intake: This is only about 2-3 beers a week    Examination:   BP 103/74   Pulse 80   Ht 6' 3.79" (1.925 m)   Wt 211 lb 9.6 oz (96 kg)   SpO2 97%   BMI 25.90 kg/m   Body mass index is 25.9 kg/m.   Exam not indicated  ASSESSMENT/ PLAN:   Diabetes type 2 with BMI 25  He has had mild diabetes with good control of Actoplusmet 15/500 twice a day Although he thinks he is watching his diet he appears to be gaining weight, this may be related to being relatively inactive with his plantar fasciitis His A1c is upper normal and consistent  Recommendations:  Check more readings after meals and not just in the morning  Bring blood sugar monitor for review every time  Start walking 20-30 minutes daily  No change in the Actoplusmet   HYPERLIPIDEMIA with increased LDL particle number in the past, will go back to 80 mg Lipitor and recheck lipids on the next visit  Hypertension: Consistently controlled with Zestoretic  Erectile dysfunction: New prescription for generic Viagra given  [facility] 01/14/2016, 8:27 AM

## 2016-01-14 NOTE — Patient Instructions (Addendum)
Restart walking daily  Check blood sugars on waking up  2x weekly  Also check blood sugars about 2 hours after a meal and do this after different meals by rotation  Recommended blood sugar levels on waking up is 90-130 and about 2 hours after meal is 130-160  Please bring your blood sugar monitor to each visit, thank you  Lipitor 80 mg next Rx

## 2016-02-05 ENCOUNTER — Ambulatory Visit: Payer: BLUE CROSS/BLUE SHIELD | Admitting: Podiatry

## 2016-02-18 IMAGING — CR DG LUMBAR SPINE COMPLETE 4+V
5 series · 5 of 5 positions shown · non-contrast
Comparison: Lumbar spine MRI 12/24/2009

CLINICAL DATA: Low back/ lumbar spine and right hip pain for 3-4
weeks. History of lumbar spine surgery.

EXAM:
LUMBAR SPINE - COMPLETE 4+ VIEW

[t lumbar spine ap]
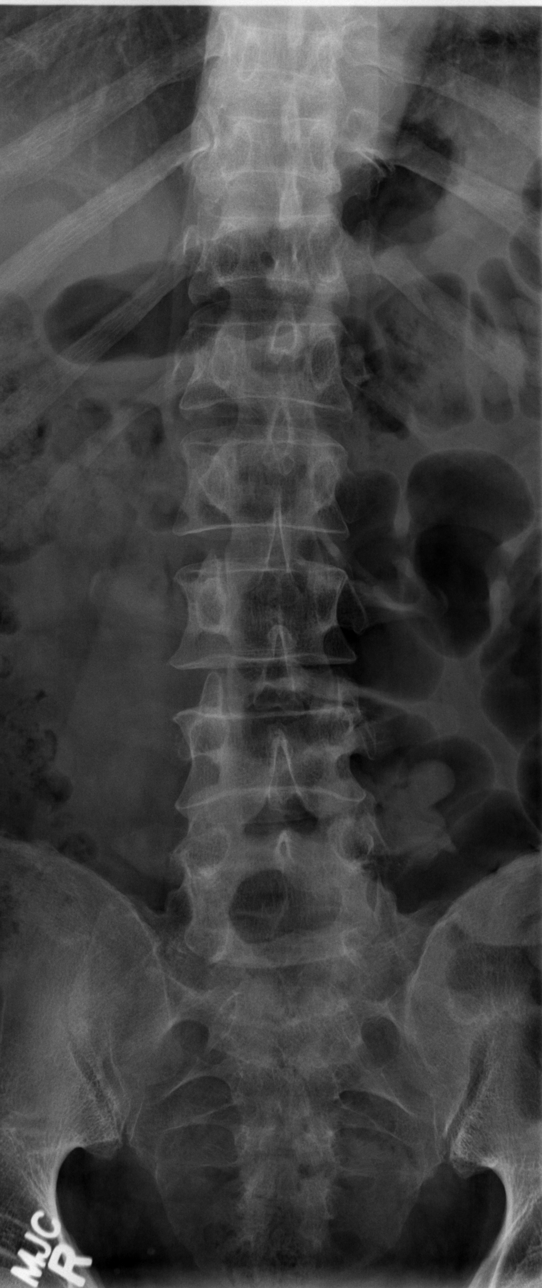

[t lumbar spine obl (1 of 2)]
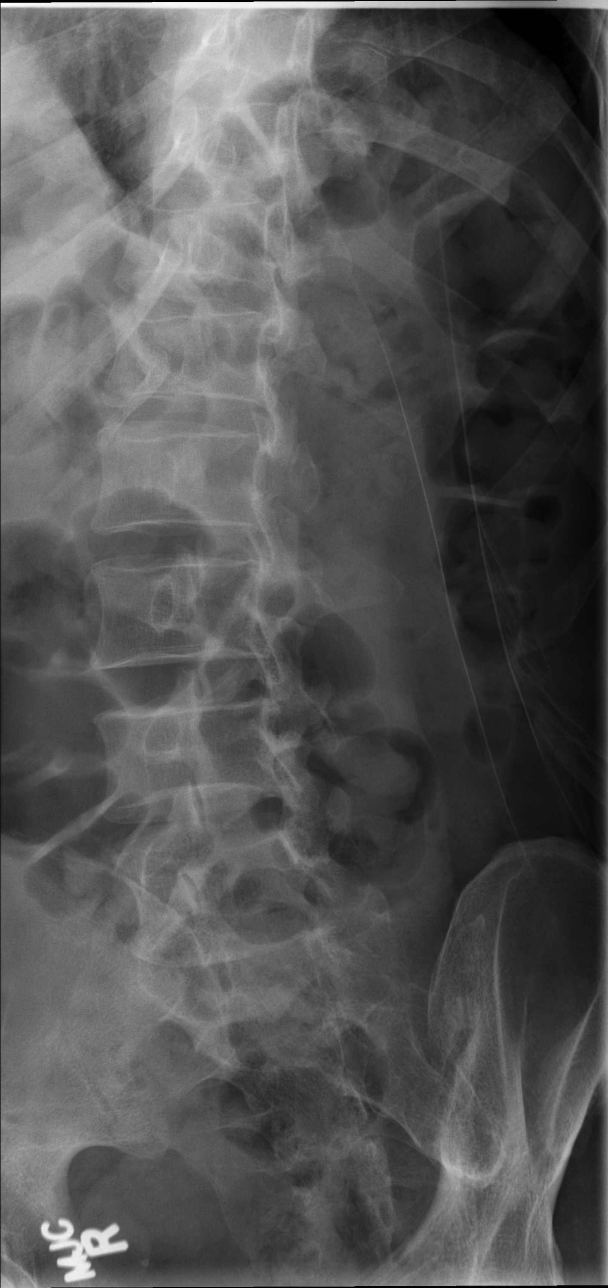

[t lumbar spine obl (2 of 2)]
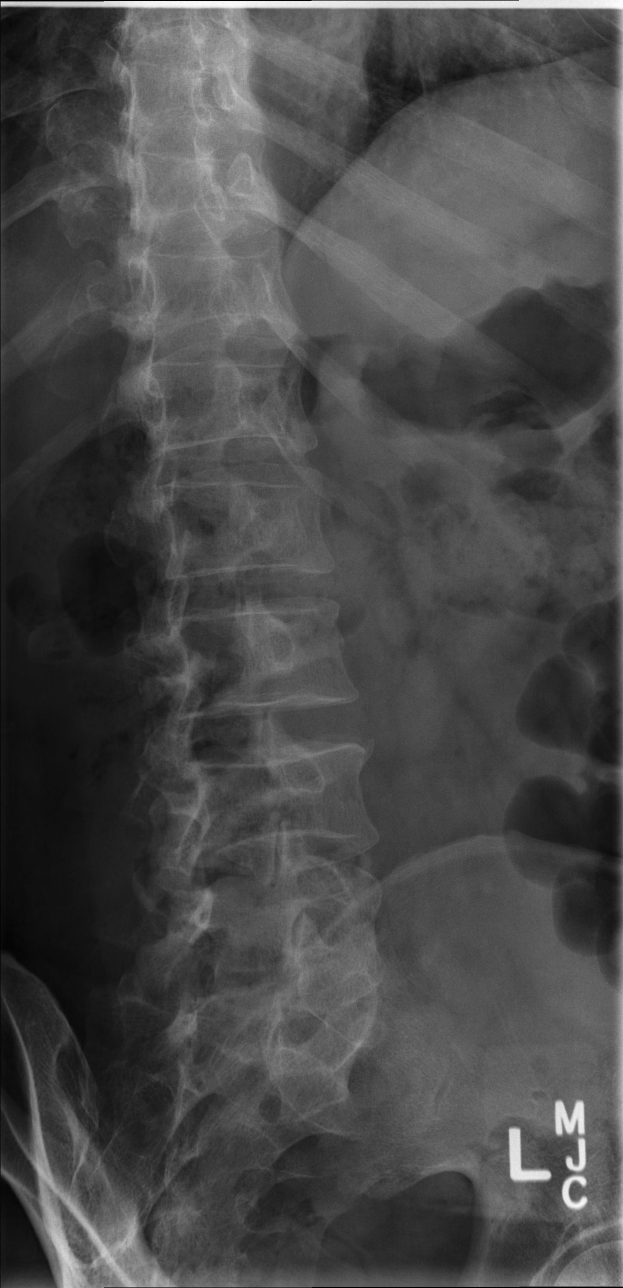

[t lumbar spine lat]
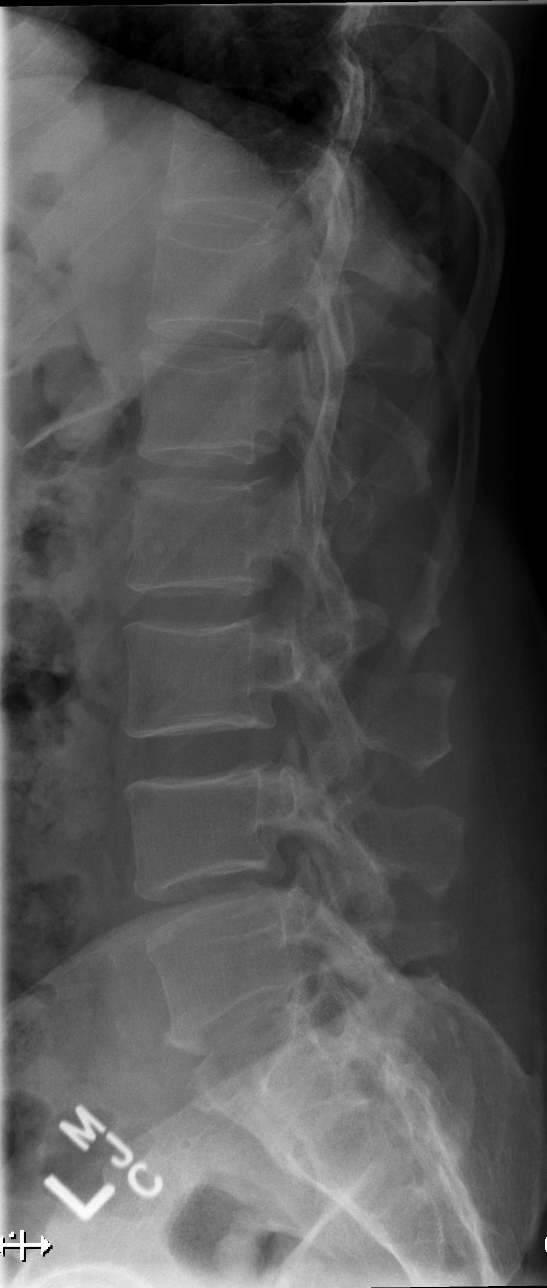

[t lumbar l-5 s-1 spot]
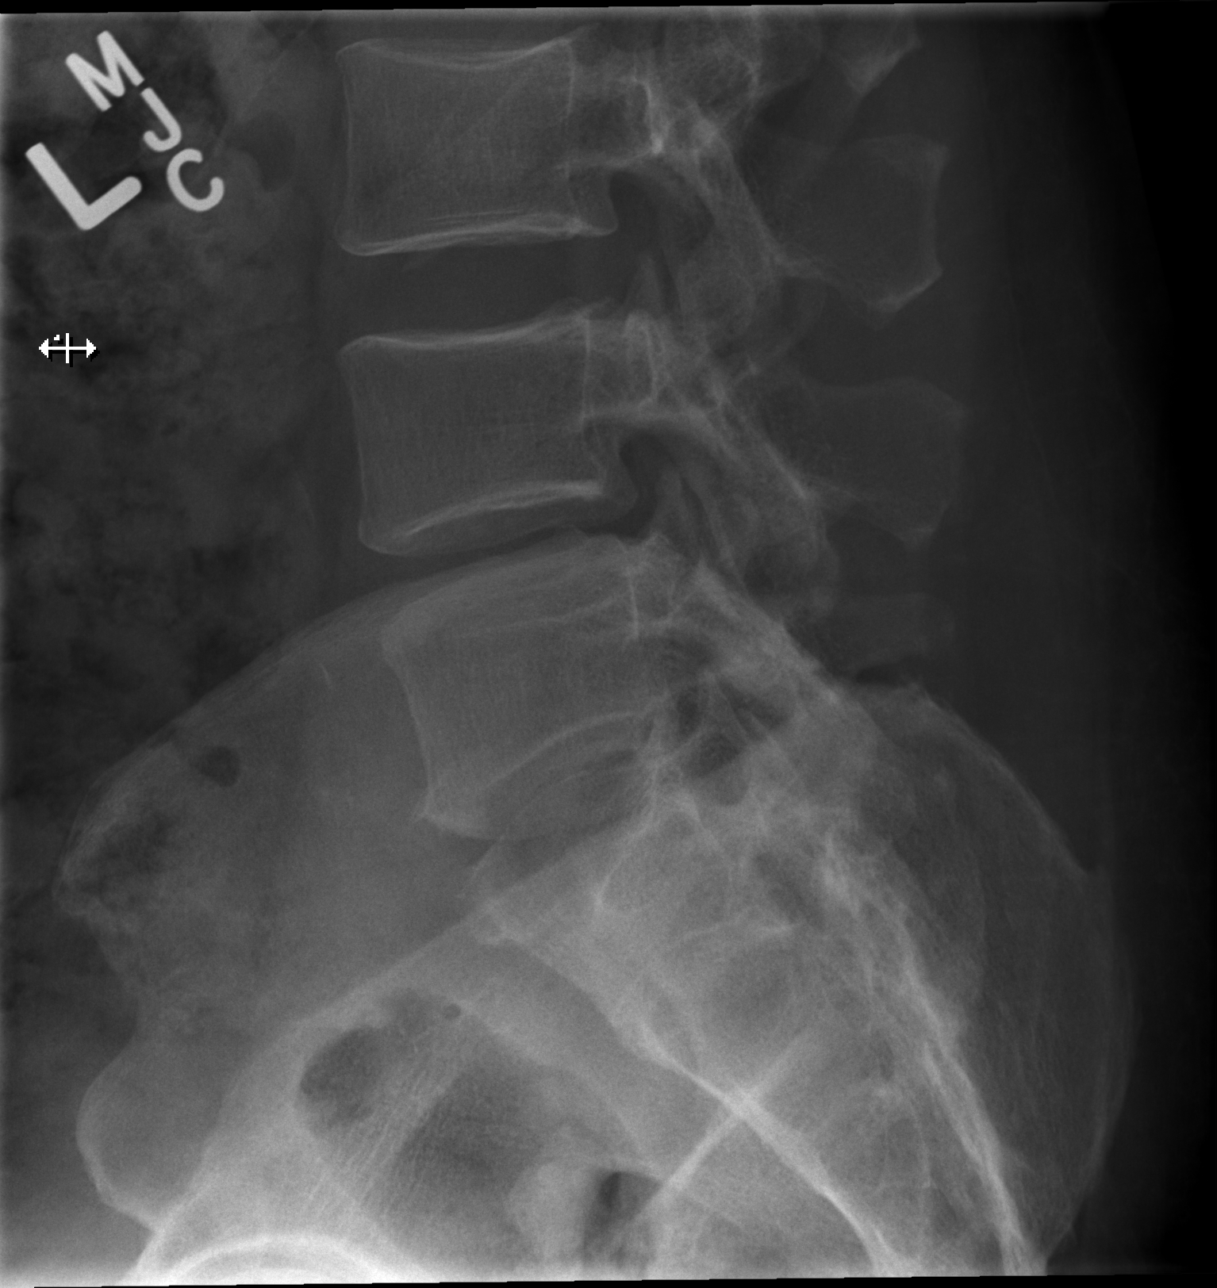

[5 of 5 positions shown; findings below may reference images not displayed]

FINDINGS: There 4 non-rib-bearing lumbar vertebra. The lower most
non-rib-bearing lumbar vertebra will be referred to as L5 in
continuity with numbering on prior MRI. There is disc space
narrowing at L5-S1 with associated endplate spurs. Mild disc space
narrowing at L4-L5. The alignment is maintained. Vertebral body
heights are normal. There is no listhesis. The posterior elements
are intact. No fracture. Sacroiliac joints are symmetric and normal.
IMPRESSION: Degenerative disc disease at L5-S1. Mild disc space narrowing at
L4-L5.

Note there are 4 non-rib-bearing lumbar vertebra, the lower most
non-rib-bearing lumbar vertebra was referred to as L5 in continuity
with prior MRI.

## 2016-04-12 ENCOUNTER — Other Ambulatory Visit: Payer: Self-pay | Admitting: Endocrinology

## 2016-04-12 NOTE — Telephone Encounter (Signed)
Patient need refill of blood pressure meds 90 days  Cameron Cardenas ([address]), [place] [phone] (Phone) [phone] (Fax)

## 2016-04-22 ENCOUNTER — Ambulatory Visit (INDEPENDENT_AMBULATORY_CARE_PROVIDER_SITE_OTHER): Payer: BLUE CROSS/BLUE SHIELD | Admitting: Podiatry

## 2016-04-22 ENCOUNTER — Encounter: Payer: Self-pay | Admitting: Podiatry

## 2016-04-22 DIAGNOSIS — E1151 Type 2 diabetes mellitus with diabetic peripheral angiopathy without gangrene: Secondary | ICD-10-CM | POA: Diagnosis not present

## 2016-04-22 DIAGNOSIS — M722 Plantar fascial fibromatosis: Secondary | ICD-10-CM

## 2016-04-22 DIAGNOSIS — B351 Tinea unguium: Secondary | ICD-10-CM | POA: Diagnosis not present

## 2016-04-22 NOTE — Progress Notes (Signed)
Subjective:     Patient ID: Cameron Cardenas, male   DOB: May 16, 1955, 61 y.o.   MRN: [id number]  HPI patient states his heel is still killing him and his not responded to the injections or complete immobilization he also has nail disease 1-5 both feet that are thickened and hard for him to wear shoe gear with with long-term history of diabetes   Review of Systems     Objective:   Physical Exam Neurovascular status intact muscle strength was found to be adequate with patient found to have exquisite discomfort in the left plantar fashion insertional point tendon calcaneus medial side with inflammation moderate flatfoot deformity that has orthotics for and severe nail disease 1-5 bilateral with thick yellow brittle nailbeds. Patient's diabetes and her current excellent control    Assessment:     Acute plantar fasciitis which is failed to respond to conservative care along with mycotic nail infection 1-5 both feet that he cannot cut himself and are painful    Plan:     H&P conditions reviewed. At this point due to the long-standing nature of symptoms and the limping that he is doing I do think surgical intervention is indicated and I discussed endoscopic release of the fascia. He wants to pursue this specific talk to his work and reappoint again in 2 weeks. I then went ahead and I debrided nailbeds 1-5 both feet with no iatrogenic bleeding noted 0.2 weeks

## 2016-04-23 IMAGING — RF DG LUMBAR SPINE 2-3V
1 series · 2 of 2 positions shown · non-contrast
Comparison: Lumbar spine MRI 04/24/2014.

CLINICAL DATA: L5-S1 PLIF for disc herniation.

EXAM:
OPERATIVE LUMBAR SPINE - 2-3 VIEW

[Series 1: run · 2 of 2 slices shown]
[im 1/2]
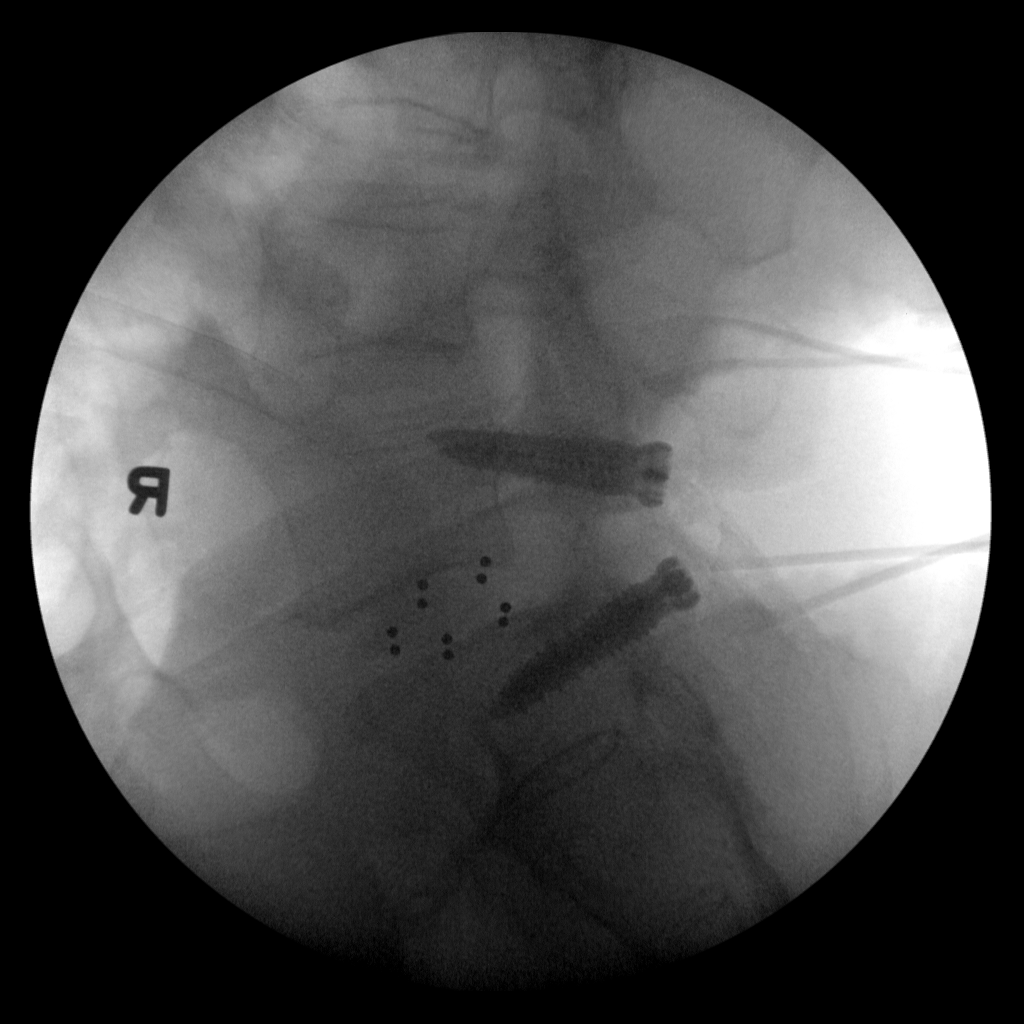
[im 2/2]
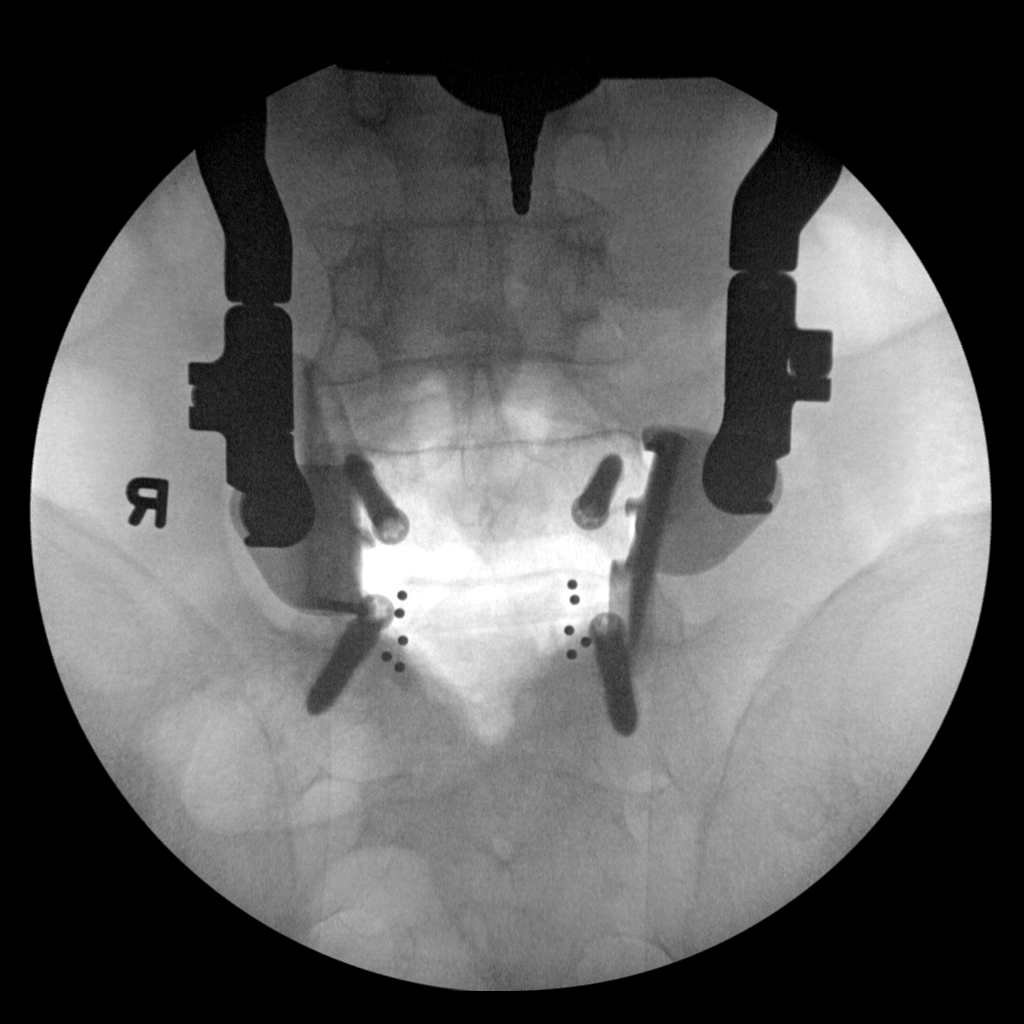

[2 of 2 positions shown; findings below may reference images not displayed]

FINDINGS: For the purposes of this dictation, the same numbering scheme will
be used as on the MRI, with L5 representing the last lumbar segment.

AP and lateral spot images obtained during the L5-S1 PLIF
demonstrate bilateral pedicle screws at L5 and S1. Interbody fusion
plugs are present in the L5-S1 disc space and are appropriately
positioned.

The radiologic technologist documented 51 sec of fluoroscopy time
during the surgery.
IMPRESSION: Images obtained during L5-S1 PLIF demonstrating bilateral pedicle
screws at L5 and S1 and interbody fusion plugs in the L5-S1 disc
space which are appropriately positioned.

## 2016-05-10 ENCOUNTER — Telehealth: Payer: Self-pay | Admitting: Endocrinology

## 2016-05-10 NOTE — Telephone Encounter (Signed)
Patient has question about his medication, metFORMIN   Stated it's not working.  Please advise

## 2016-05-10 NOTE — Telephone Encounter (Signed)
Requested a call back from the patient to discuss further.  

## 2016-05-10 NOTE — Telephone Encounter (Signed)
Requested a call back to discuss his metformin questions.

## 2016-05-12 ENCOUNTER — Ambulatory Visit: Payer: BLUE CROSS/BLUE SHIELD | Admitting: Podiatry

## 2016-06-09 ENCOUNTER — Other Ambulatory Visit: Payer: Self-pay

## 2016-06-09 ENCOUNTER — Telehealth: Payer: Self-pay | Admitting: Endocrinology

## 2016-06-09 MED ORDER — ROSUVASTATIN CALCIUM 20 MG PO TABS: 20.0000 mg | ORAL_TABLET | Freq: Every day | ORAL | 2 refills | Status: DC

## 2016-06-09 NOTE — Telephone Encounter (Signed)
He can change to Crestor 20 mg daily with food

## 2016-06-09 NOTE — Telephone Encounter (Signed)
Verified cell #- patient is requesting an alternative to atorvastatin (LIPITOR) 80 MG tablet [[id number]]  States that is it making him sick to his stomach

## 2016-06-09 NOTE — Telephone Encounter (Signed)
Called patient and sent new script to [facility] on [address] for the Crestor 20mg .

## 2016-06-09 NOTE — Telephone Encounter (Signed)
Please advise 

## 2016-06-24 ENCOUNTER — Telehealth: Payer: Self-pay | Admitting: Endocrinology

## 2016-06-24 NOTE — Telephone Encounter (Signed)
Patient states he has been having chest flutters for about 3 weeks and was asking for a cardiac referral. I spoke to [place] in [facility] and she asked for me to send a note.  Patient got disconnected. Please call.

## 2016-06-26 NOTE — Telephone Encounter (Signed)
PCP to refer, thanks

## 2016-06-27 NOTE — Telephone Encounter (Signed)
Left message asking patient to call back----trying to work him in on dr Ronnald Ramp schedule today---has 2:30 and 10:30 opening right now---or we may could possibly double book at 11:15---can see Cameron Cardenas if any questions

## 2016-06-27 NOTE — Telephone Encounter (Signed)
Ask him to come in today to see me and to have an EKG

## 2016-06-27 NOTE — Telephone Encounter (Signed)
Dr Ronnald Ramp advised

## 2016-06-27 NOTE — Telephone Encounter (Signed)
Pt called and stated he does not want to see Dr. Ronnald Ramp, he is no longer seeing him

## 2016-07-12 ENCOUNTER — Other Ambulatory Visit (INDEPENDENT_AMBULATORY_CARE_PROVIDER_SITE_OTHER): Payer: 59

## 2016-07-12 ENCOUNTER — Other Ambulatory Visit: Payer: Self-pay | Admitting: Endocrinology

## 2016-07-12 DIAGNOSIS — E119 Type 2 diabetes mellitus without complications: Secondary | ICD-10-CM | POA: Diagnosis not present

## 2016-07-12 LAB — MICROALBUMIN / CREATININE URINE RATIO
CREATININE, U: 203.2 mg/dL
MICROALB/CREAT RATIO: 0.3 mg/g (ref 0.0–30.0)
Microalb, Ur: 0.7 mg/dL (ref 0.0–1.9)

## 2016-07-12 LAB — COMPREHENSIVE METABOLIC PANEL
ALBUMIN: 3.9 g/dL (ref 3.5–5.2)
ALT: 18 U/L (ref 0–53)
AST: 15 U/L (ref 0–37)
Alkaline Phosphatase: 42 U/L (ref 39–117)
BUN: 21 mg/dL (ref 6–23)
CALCIUM: 9.1 mg/dL (ref 8.4–10.5)
CHLORIDE: 108 meq/L (ref 96–112)
CO2: 29 meq/L (ref 19–32)
CREATININE: 1.19 mg/dL (ref 0.40–1.50)
GFR: 79.67 mL/min (ref >60.00)
Glucose, Bld: 143 mg/dL — ABNORMAL HIGH (ref 70–99)
Potassium: 4.2 mEq/L (ref 3.5–5.1)
Sodium: 142 mEq/L (ref 135–145)
Total Bilirubin: 0.6 mg/dL (ref 0.2–1.2)
Total Protein: 6.4 g/dL (ref 6.0–8.3)

## 2016-07-12 LAB — LIPID PANEL
CHOLESTEROL: 226 mg/dL — AB (ref 0–200)
HDL: 80.5 mg/dL (ref >39.00)
LDL CALC: 129 mg/dL — AB (ref 0–99)
NonHDL: 145.42
TRIGLYCERIDES: 80 mg/dL (ref 0.0–149.0)
Total CHOL/HDL Ratio: 3
VLDL: 16 mg/dL (ref 0.0–40.0)

## 2016-07-12 LAB — HEMOGLOBIN A1C: Hgb A1c MFr Bld: 5.7 % (ref 4.6–6.5)

## 2016-07-14 ENCOUNTER — Other Ambulatory Visit: Payer: Self-pay

## 2016-07-14 ENCOUNTER — Telehealth: Payer: Self-pay | Admitting: Endocrinology

## 2016-07-14 MED ORDER — ATORVASTATIN CALCIUM 80 MG PO TABS: 80.0000 mg | ORAL_TABLET | Freq: Every day | ORAL | 5 refills | Status: DC

## 2016-07-14 NOTE — Telephone Encounter (Signed)
Called patient and left a voice message to let him know that [facility].

## 2016-07-14 NOTE — Telephone Encounter (Signed)
**  Remind patient they can make refill requests via MyChart**  Medication refill request (Name & Dosage):  atorvastatin (LIPITOR) 80 MG tablet  Preferred pharmacy (Name & Address):  [id number] - [place], [place]   Other comments (if applicable):   Notify patient once rx has been placed.

## 2016-07-15 ENCOUNTER — Ambulatory Visit (INDEPENDENT_AMBULATORY_CARE_PROVIDER_SITE_OTHER): Payer: 59 | Admitting: Endocrinology

## 2016-07-15 ENCOUNTER — Encounter: Payer: Self-pay | Admitting: Endocrinology

## 2016-07-15 VITALS — BP 116/86 | HR 72 | Ht 75.0 in | Wt 208.2 lb

## 2016-07-15 DIAGNOSIS — E119 Type 2 diabetes mellitus without complications: Secondary | ICD-10-CM

## 2016-07-15 DIAGNOSIS — E785 Hyperlipidemia, unspecified: Secondary | ICD-10-CM | POA: Diagnosis not present

## 2016-07-15 MED ORDER — SILDENAFIL CITRATE 20 MG PO TABS: ORAL_TABLET | ORAL | 3 refills | Status: DC

## 2016-07-15 MED ORDER — EZETIMIBE 10 MG PO TABS: 10.0000 mg | ORAL_TABLET | Freq: Every day | ORAL | 2 refills | Status: DC

## 2016-07-15 NOTE — Patient Instructions (Addendum)
Check blood sugars on waking up  2/7 days  Also check blood sugars about 2 hours after a meal and do this after different meals by rotation  Recommended blood sugar levels on waking up is 90-130 and about 2 hours after meal is 130-160  Please bring your blood sugar monitor to each visit, thank you  

## 2016-07-15 NOTE — Progress Notes (Signed)
Patient ID: Cameron Cardenas, male   DOB: 05-31-1955, 61 y.o.   MRN: [id number]   Reason for Appointment: Endocrinology follow-up   History of Present Illness   Diagnosis: Type 2 DIABETES MELITUS, date of diagnosis: 2004  PAST history: He had mild diabetes at onset and was treated with metformin and subsequently Actoplusmet In 2013 he had changed his diet significantly and started exercising. This helped him with weight loss Subsequently his blood sugars have been excellent with A1c upper normal  RECENT history:  Oral hypoglycemic drugs: Actoplusmet 15/500 twice a day   He is returning for his 6 month follow-up   His blood sugars are controlled and A1c is again 5.7  Current management, problems and blood sugar patterns:  He did not bring his monitor for download again  He is not sure if he has expired test strips and probably checking frequently  Although his fasting blood sugar in the lab is again in the 140s he thinks his blood sugars in the mornings are still around 100, however he checks his blood sugars late in the morning around 9-10 AM  However he is not checking blood sugars after meals and may do some readings around suppertime which are below 100 frequently  He has lost a little weight  He says that he is trying to eat a healthy diet and mostly having protein shakes or other smoothies in the morning  Still not exercising consistently because of having plantar fascitis even though he is not symptomatic now with treatment             Side effects from medications: None  Monitors blood glucose infrequently: One Touch  Physical activity: exercise: bike 2/7 days, some walking       Dietician visit: Most recent: 12/13           Smoothies in am for breakfast    Wt Readings from Last 3 Encounters:  07/15/16 208 lb 3.2 oz (94.4 kg)  01/14/16 211 lb 9.6 oz (96 kg)  12/09/15 209 lb (94.8 kg)   Lab Results  Component Value Date   HGBA1C 5.7 07/12/2016   HGBA1C 5.7 01/07/2016   HGBA1C 6.2 04/28/2015   Lab Results  Component Value Date   MICROALBUR 0.7 07/12/2016   LDLCALC 129 (H) 07/12/2016   CREATININE 1.19 07/12/2016    Lab on 07/12/2016  Component Date Value Ref Range Status  . Hgb A1c MFr Bld 07/12/2016 5.7  4.6 - 6.5 % Final   Glycemic Control Guidelines for People with Diabetes:Non Diabetic:  <6%Goal of Therapy: <7%Additional Action Suggested:  >8%   . Sodium 07/12/2016 142  135 - 145 mEq/L Final  . Potassium 07/12/2016 4.2  3.5 - 5.1 mEq/L Final  . Chloride 07/12/2016 108  96 - 112 mEq/L Final  . CO2 07/12/2016 29  19 - 32 mEq/L Final  . Glucose, Bld 07/12/2016 143* 70 - 99 mg/dL Final  . BUN 07/12/2016 21  6 - 23 mg/dL Final  . Creatinine, Ser 07/12/2016 1.19  0.40 - 1.50 mg/dL Final  . Total Bilirubin 07/12/2016 0.6  0.2 - 1.2 mg/dL Final  . Alkaline Phosphatase 07/12/2016 42  39 - 117 U/L Final  . AST 07/12/2016 15  0 - 37 U/L Final  . ALT 07/12/2016 18  0 - 53 U/L Final  . Total Protein 07/12/2016 6.4  6.0 - 8.3 g/dL Final  . Albumin 07/12/2016 3.9  3.5 - 5.2 g/dL Final  . Calcium 07/12/2016 9.1  8.4 - 10.5 mg/dL Final  . GFR 07/12/2016 79.67  >60.00 mL/min Final  . Cholesterol 07/12/2016 226* 0 - 200 mg/dL Final   ATP III Classification       Desirable:  < 200 mg/dL               Borderline High:  200 - 239 mg/dL          High:  > = 240 mg/dL  . Triglycerides 07/12/2016 80.0  0.0 - 149.0 mg/dL Final   Normal:  <150 mg/dLBorderline High:  150 - 199 mg/dL  . HDL 07/12/2016 80.50  >39.00 mg/dL Final  . VLDL 07/12/2016 16.0  0.0 - 40.0 mg/dL Final  . LDL Cholesterol 07/12/2016 129* 0 - 99 mg/dL Final  . Total CHOL/HDL Ratio 07/12/2016 3   Final                  Men          Women1/2 Average Risk     3.4          3.3Average Risk          5.0          4.42X Average Risk          9.6          7.13X Average Risk          15.0          11.0                      . NonHDL 07/12/2016 145.42   Final   NOTE:  Non-HDL goal should  be 30 mg/dL higher than patient's LDL goal (i.e. LDL goal of < 70 mg/dL, would have non-HDL goal of < 100 mg/dL)  . Microalb, Ur 07/12/2016 0.7  0.0 - 1.9 mg/dL Final  . Creatinine,U 07/12/2016 203.2  mg/dL Final  . Microalb Creat Ratio 07/12/2016 0.3  0.0 - 30.0 mg/g Final     Allergies as of 07/15/2016      Reactions   Percocet [oxycodone-acetaminophen] Other (See Comments)   Caused hallucinations      Medication List       Accurate as of 07/15/16  8:34 AM. Always use your most recent med list.          aspirin 81 MG tablet Take 81 mg by mouth daily.   atorvastatin 80 MG tablet Commonly known as:  LIPITOR Take 1 tablet (80 mg total) by mouth daily.   cetirizine 10 MG tablet Commonly known as:  ZYRTEC Take 10 mg by mouth daily.   fluticasone 50 MCG/ACT nasal spray Commonly known as:  FLONASE Place 2 sprays into both nostrils daily.   gabapentin 300 MG capsule Commonly known as:  NEURONTIN   HYDROcodone-acetaminophen 5-325 MG tablet Commonly known as:  NORCO/VICODIN Take 1 tablet by mouth every 6 (six) hours as needed for moderate pain.   lisinopril-hydrochlorothiazide 10-12.5 MG tablet Commonly known as:  PRINZIDE,ZESTORETIC TAKE 1 TABLET BY MOUTH ONCE DAILY   pioglitazone-metformin 15-500 MG tablet Commonly known as:  ACTOPLUS MET TAKE ONE TABLET BY MOUTH TWICE DAILY   potassium chloride 10 MEQ tablet Commonly known as:  K-DUR Take 1 tablet (10 mEq total) by mouth daily.   rosuvastatin 20 MG tablet Commonly known as:  CRESTOR Take 1 tablet (20 mg total) by mouth daily.   sildenafil 20 MG tablet Commonly known as:  REVATIO '20mg'$ , 2-3 prn as directed  triamcinolone ointment 0.5 % Commonly known as:  KENALOG   triamterene 100 MG capsule Commonly known as:  DYRENIUM 100 mg.       Allergies:  Allergies  Allergen Reactions  . Percocet [Oxycodone-Acetaminophen] Other (See Comments)    Caused hallucinations    Past Medical History:  Diagnosis  Date  . Allergic rhinitis due to pollen   . Essential hypertension, malignant   . Lumbago   . Pure hypercholesterolemia   . Type II or unspecified type diabetes mellitus without mention of complication, not stated as uncontrolled    dx in 2005  . Unspecified vitamin D deficiency     Past Surgical History:  Procedure Laterality Date  . COLONOSCOPY     in [facility], MD no longer in practice, does not recall the name of facility. Does belive polyps were removed  . [place] SURGERY  2011  . MAXIMUM ACCESS (MAS)POSTERIOR LUMBAR INTERBODY FUSION (PLIF) 1 LEVEL N/A 06/10/2014   Procedure: Redo Lumbar Five-Sacral One Decompression with maximum access posterior lumbar interbody fusion;  Surgeon: Erline Levine, MD;  Location: [facility];  Service: Neurosurgery;  Laterality: N/A;  Redo Decompression with maximum access posterior lumbar interbody fusion, L5-S1    Family History  Problem Relation Age of Onset  . Hypertension Mother   . Hypertension Father   . Diabetes Sister   . Colon cancer Neg Hx   . Esophageal cancer Neg Hx   . Rectal cancer Neg Hx   . Stomach cancer Neg Hx   . Heart disease Neg Hx     Social History:  reports that he quit smoking about 23 years ago. He has never used smokeless tobacco. He reports that he drinks alcohol. He reports that he uses drugs, including Marijuana.   Lab on 07/12/2016  Component Date Value Ref Range Status  . Hgb A1c MFr Bld 07/12/2016 5.7  4.6 - 6.5 % Final   Glycemic Control Guidelines for People with Diabetes:Non Diabetic:  <6%Goal of Therapy: <7%Additional Action Suggested:  >8%   . Sodium 07/12/2016 142  135 - 145 mEq/L Final  . Potassium 07/12/2016 4.2  3.5 - 5.1 mEq/L Final  . Chloride 07/12/2016 108  96 - 112 mEq/L Final  . CO2 07/12/2016 29  19 - 32 mEq/L Final  . Glucose, Bld 07/12/2016 143* 70 - 99 mg/dL Final  . BUN 07/12/2016 21  6 - 23 mg/dL Final  . Creatinine, Ser 07/12/2016 1.19  0.40 - 1.50 mg/dL Final  . Total Bilirubin  07/12/2016 0.6  0.2 - 1.2 mg/dL Final  . Alkaline Phosphatase 07/12/2016 42  39 - 117 U/L Final  . AST 07/12/2016 15  0 - 37 U/L Final  . ALT 07/12/2016 18  0 - 53 U/L Final  . Total Protein 07/12/2016 6.4  6.0 - 8.3 g/dL Final  . Albumin 07/12/2016 3.9  3.5 - 5.2 g/dL Final  . Calcium 07/12/2016 9.1  8.4 - 10.5 mg/dL Final  . GFR 07/12/2016 79.67  >60.00 mL/min Final  . Cholesterol 07/12/2016 226* 0 - 200 mg/dL Final   ATP III Classification       Desirable:  < 200 mg/dL               Borderline High:  200 - 239 mg/dL          High:  > = 240 mg/dL  . Triglycerides 07/12/2016 80.0  0.0 - 149.0 mg/dL Final   Normal:  <150 mg/dLBorderline High:  150 -  199 mg/dL  . HDL 07/12/2016 80.50  >39.00 mg/dL Final  . VLDL 07/12/2016 16.0  0.0 - 40.0 mg/dL Final  . LDL Cholesterol 07/12/2016 129* 0 - 99 mg/dL Final  . Total CHOL/HDL Ratio 07/12/2016 3   Final                  Men          Women1/2 Average Risk     3.4          3.3Average Risk          5.0          4.42X Average Risk          9.6          7.13X Average Risk          15.0          11.0                      . NonHDL 07/12/2016 145.42   Final   NOTE:  Non-HDL goal should be 30 mg/dL higher than patient's LDL goal (i.e. LDL goal of < 70 mg/dL, would have non-HDL goal of < 100 mg/dL)  . Microalb, Ur 07/12/2016 0.7  0.0 - 1.9 mg/dL Final  . Creatinine,U 07/12/2016 203.2  mg/dL Final  . Microalb Creat Ratio 07/12/2016 0.3  0.0 - 30.0 mg/g Final    Review of Systems:  HYPERTENSION:   Treated with Zestoretic 10/12.5  HYPERLIPIDEMIA: The lipid abnormality consists of elevated LDL which was 94 previously He had been on 40 mg atorvastatin since 12/13 and this was increased to 80 mg in 6/16 Previously did have LDL particle number of 1348 previously and which was relatively high at 1216 in 6/16  Last LDL was adequately controlled However about a month or so ago he called saying that he was feeling tired and not doing well with Lipitor and  wanted to change  With starting Crestor 20 g daily he is not having any fatigue or aching He thinks he is taking this regularly However his LDL is significantly higher even though triglycerides are fairly good    Lab Results  Component Value Date   CHOL 226 (H) 07/12/2016   HDL 80.50 07/12/2016   LDLCALC 129 (H) 07/12/2016   TRIG 80.0 07/12/2016   CHOLHDL 3 07/12/2016      He had a foot exam in 11/17    Examination:   BP 116/86   Pulse 72   Ht '6\' 3"'$  (1.905 m)   Wt 208 lb 3.2 oz (94.4 kg)   SpO2 98%   BMI 26.02 kg/m   Body mass index is 26.02 kg/m.   No ankle edema present  ASSESSMENT/ PLAN:   Diabetes type 2 with BMI 25  He has had mild diabetes with good control of Actoplusmet 15/500 twice a day A1c is again excellent at 5.7 He is asking about reading about adverse effects of metformin on the Internet and reassured him that this is not scientifically based  He is probably checking readings very sporadically and none after meals and discussed importance of checking at various times He may also have expired test strips He can get back into exercise more significantly now since he is having less foot pain   Recommendations:  Start using One Touch Verio IQ meter  He will alternate fasting and was postprandial  readings even if he does not do them everyday  Discussed  blood sugar targets  He will check his fasting reading on waking up  Follow-up in 3 months  No change in the Actoplusmet   HYPERLIPIDEMIA with increased LDL particle number in the past Since he does not think he was tolerating Lipitor 80 mg and LDL is high with 20 mg Crestor he will start taking Zetia, explained how this works Will need repeat lipids in 3 months  Hypertension: Fairly well controlled with Zestoretic, also continue follow-up with PCP  Erectile dysfunction: New prescription for generic Viagra given  Joeangel Jeanpaul 07/15/2016, 8:34 AM

## 2016-07-25 ENCOUNTER — Ambulatory Visit: Payer: BLUE CROSS/BLUE SHIELD | Admitting: Nurse Practitioner

## 2016-07-31 DIAGNOSIS — R002 Palpitations: Secondary | ICD-10-CM | POA: Insufficient documentation

## 2016-07-31 NOTE — Progress Notes (Signed)
Cardiology Office Note    Date:  08/01/2016   ID:  Amen, Staszak May 03, 1955, MRN [id number]  PCP:  Merrilee Seashore, MD  Cardiologist: Sinclair Grooms, MD   Chief Complaint  Patient presents with  . Chest Pain  . Palpitations    History of Present Illness:  Cameron Cardenas is a 61 y.o. male referred by Merrilee Seashore, M.D. for evaluation of chest pain.  Diabetic for 11 years with seven-year history of recurring left precordial chest pressure that lasts up to 10-15 minutes. It occurs randomly. He stress Myoview was performed in 2011 and was low risk. There is no exertional component. 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 In present for at least one year.  Additionally he has had palpitations. These occur usually when at work. It is an awareness that his heartbeat is irregular. His heart is not racing. It is frightening when it occurs. It can happen every day or can happen once a week. Has been going on now for 2-3 months.  Does not smoke cigarettes, no family history of CAD, history of hypertension and hyperlipidemia.  Greater than 50% of the office visit was spent in counseling concerning risk factors and risks mediation.  Past Medical History:  Diagnosis Date  . Allergic rhinitis due to pollen   . Essential hypertension, malignant   . Lumbago   . Pure hypercholesterolemia   . Type II or unspecified type diabetes mellitus without mention of complication, not stated as uncontrolled    dx in 2005  . Unspecified vitamin D deficiency     Past Surgical History:  Procedure Laterality Date  . COLONOSCOPY     in [facility], MD no longer in practice, does not recall the name of facility. Does belive polyps were removed  . [place] SURGERY  2011  . MAXIMUM ACCESS (MAS)POSTERIOR LUMBAR INTERBODY FUSION (PLIF) 1 LEVEL N/A 06/10/2014   Procedure: Redo Lumbar Five-Sacral One Decompression with maximum access posterior  lumbar interbody fusion;  Surgeon: Erline Levine, MD;  Location: [place] [facility];  Service: Neurosurgery;  Laterality: N/A;  Redo Decompression with maximum access posterior lumbar interbody fusion, L5-S1    Current Medications: Outpatient Medications Prior to Visit  Medication Sig Dispense Refill  . aspirin 81 MG tablet Take 81 mg by mouth daily.    Marland Kitchen ezetimibe (ZETIA) 10 MG tablet Take 1 tablet (10 mg total) by mouth daily. 30 tablet 2  . HYDROcodone-acetaminophen (NORCO/VICODIN) 5-325 MG tablet Take 1 tablet by mouth every 6 (six) hours as needed for moderate pain. 30 tablet 0  . lisinopril-hydrochlorothiazide (PRINZIDE,ZESTORETIC) 10-12.5 MG tablet TAKE 1 TABLET BY MOUTH ONCE DAILY 90 tablet 0  . pioglitazone-metformin (ACTOPLUS MET) 15-500 MG tablet TAKE ONE TABLET BY MOUTH TWICE DAILY 180 tablet 0  . rosuvastatin (CRESTOR) 20 MG tablet Take 1 tablet (20 mg total) by mouth daily. 90 tablet 2  . sildenafil (REVATIO) 20 MG tablet '20mg'$ , 2-3 prn as directed 30 tablet 3  . triamcinolone ointment (KENALOG) 0.5 % Apply 1 application topically 2 (two) times daily.   0  . cetirizine (ZYRTEC) 10 MG tablet Take 10 mg by mouth daily.    . fluticasone (FLONASE) 50 MCG/ACT nasal spray Place 2 sprays into both nostrils daily. (Patient not taking: Reported on 07/15/2016) 16 g 11  . gabapentin (NEURONTIN) 300 MG capsule   0  . triamterene (DYRENIUM) 100 MG capsule 100 mg.  No facility-administered medications prior to visit.      Allergies:   Percocet [oxycodone-acetaminophen]   Social History   Social History  . Marital status: Single    Spouse name: N/A  . Number of children: N/A  . Years of education: N/A   Social History Main Topics  . Smoking status: Former Smoker    Quit date: 08/28/1992  . Smokeless tobacco: Never Used  . Alcohol use 0.0 oz/week     Comment: occassional  . Drug use: Yes    Types: Marijuana     Comment: occas.  . Sexual activity: Not Asked   Other Topics Concern  .  None   Social History Narrative  . None     Family History:  The patient's family history includes Diabetes in his sister; Hypertension in his father and mother.   ROS:   Please see the history of present illness.    Denies claudication. He is a Barrister's clerk. High stress job. Does not smoke. Denies claudication like symptoms. No orthopnea or PND.  All other systems reviewed and are negative.   PHYSICAL EXAM:   VS:  BP 92/64 (BP Location: Left Arm)   Pulse 64   Ht '6\' 4"'$  (1.93 m)   Wt 210 lb (95.3 kg)   BMI 25.56 kg/m    GEN: Well nourished, well developed, in no acute distress  HEENT: normal  Neck: no JVD, carotid bruits, or masses Cardiac: RRR; no murmurs, rubs, or gallops,no edema  Respiratory:  clear to auscultation bilaterally, normal work of breathing GI: soft, nontender, nondistended, + BS MS: no deformity or atrophy  Skin: warm and dry, no rash Neuro:  Alert and Oriented x 3, Strength and sensation are intact Psych: euthymic mood, full affect  Wt Readings from Last 3 Encounters:  08/01/16 210 lb (95.3 kg)  07/15/16 208 lb 3.2 oz (94.4 kg)  01/14/16 211 lb 9.6 oz (96 kg)      Studies/Labs Reviewed:   EKG:  EKG  Sinus rhythm at 64 bpm, 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 No change when compared to 2 years ago.  Recent Labs: 07/12/2016: ALT 18; BUN 21; Creatinine, Ser 1.19; Potassium 4.2; Sodium 142   Lipid Panel    Component Value Date/Time   CHOL 226 (H) 07/12/2016 0832   TRIG 80.0 07/12/2016 0832   HDL 80.50 07/12/2016 0832   CHOLHDL 3 07/12/2016 0832   VLDL 16.0 07/12/2016 0832   LDLCALC 129 (H) 07/12/2016 0832    Additional studies/ records that were reviewed today include:  Nuclear stress test 08/13/2009: IMPRESSION:  Normal examination without evidence of exercise induced myocardial ischemia.  The calculated left ventricular ejection fraction is 60%.   ASSESSMENT:    1. Chest discomfort   2. Palpitations   3. Essential  hypertension   4. Bifascicular bundle branch block   5. Type 2 diabetes mellitus with complication, without long-term current use of insulin ([place])   6. ED (erectile dysfunction) of organic origin   7. Hyperlipidemia with target LDL less than 100      PLAN:  In order of problems listed above:  1. The chest discomfort is similar to that experienced 7 years ago when a nuclear study was performed and was "low risk". The discomfort is recurring and nonexertional. He has marked risk factors including age, male sex, hypertension, type 2 diabetes, and severe hyperlipidemia. I have recommended a coronary CT angiogram to define anatomy and help guide therapy. 2. 48 hour  Holter has been recommended. This will help exclude significant arrhythmia. 3. Target is 130/90 mmHg or less. 4. 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 With palpitations, we need to exclude the possibility of high-grade AV block. 5. Target A1c less than 7. 6. Not addressed 7. LDL less than 70.   Multiple risk factors and recurring left chest discomfort etiology undefined at this point. A CT angiogram of the coronary arteries is recommended. A Holter monitor is recommended to identify etiology of palpitations. We discussed targets for risk factors to avoid overt CAD event. Further recommendations will be dependent upon findings.    Medication Adjustments/Labs and Tests Ordered: Current medicines are reviewed at length with the patient today.  Concerns regarding medicines are outlined above.  Medication changes, Labs and Tests ordered today are listed in the Patient Instructions below. Patient Instructions  Medication Instructions:  None  Labwork: None  Testing/Procedures: Your physician has recommended that you wear a 48 hour holter monitor. Holter monitors are medical devices that record the heart's electrical activity. Doctors most often use these monitors to  diagnose arrhythmias. Arrhythmias are problems with the speed or rhythm of the heartbeat. The monitor is a small, portable device. You can wear one while you do your normal daily activities. This is usually used to diagnose what is causing palpitations/syncope (passing out).  Your physician has requested that you have cardiac CT. Cardiac computed tomography (CT) is a painless test that uses an x-ray machine to take clear, detailed pictures of your heart. For further information please visit HugeFiesta.tn. Please follow instruction sheet as given.   Follow-Up: Your physician recommends that you schedule a follow-up appointment as needed with Dr. Tamala Julian.    Any Other Special Instructions Will Be Listed Below (If Applicable).     If you need a refill on your cardiac medications before your next appointment, please call your pharmacy.      Signed, Sinclair Grooms, MD  08/01/2016 2:26 PM    [address], [place], [place]  [address] Phone: [phone]; Fax: [phone]

## 2016-08-01 ENCOUNTER — Encounter (INDEPENDENT_AMBULATORY_CARE_PROVIDER_SITE_OTHER): Payer: Self-pay

## 2016-08-01 ENCOUNTER — Ambulatory Visit (INDEPENDENT_AMBULATORY_CARE_PROVIDER_SITE_OTHER): Payer: 59 | Admitting: Interventional Cardiology

## 2016-08-01 ENCOUNTER — Encounter: Payer: Self-pay | Admitting: Interventional Cardiology

## 2016-08-01 VITALS — BP 92/64 | HR 64 | Ht 76.0 in | Wt 210.0 lb

## 2016-08-01 DIAGNOSIS — R002 Palpitations: Secondary | ICD-10-CM | POA: Diagnosis not present

## 2016-08-01 DIAGNOSIS — I10 Essential (primary) hypertension: Secondary | ICD-10-CM

## 2016-08-01 DIAGNOSIS — E118 Type 2 diabetes mellitus with unspecified complications: Secondary | ICD-10-CM

## 2016-08-01 DIAGNOSIS — R0789 Other chest pain: Secondary | ICD-10-CM | POA: Diagnosis not present

## 2016-08-01 DIAGNOSIS — E785 Hyperlipidemia, unspecified: Secondary | ICD-10-CM | POA: Diagnosis not present

## 2016-08-01 DIAGNOSIS — N529 Male erectile dysfunction, unspecified: Secondary | ICD-10-CM

## 2016-08-01 DIAGNOSIS — I452 Bifascicular block: Secondary | ICD-10-CM

## 2016-08-01 NOTE — Patient Instructions (Signed)
Medication Instructions:  None  Labwork: None  Testing/Procedures: Your physician has recommended that you wear a 48 hour holter monitor. Holter monitors are medical devices that record the heart's electrical activity. Doctors most often use these monitors to diagnose arrhythmias. Arrhythmias are problems with the speed or rhythm of the heartbeat. The monitor is a small, portable device. You can wear one while you do your normal daily activities. This is usually used to diagnose what is causing palpitations/syncope (passing out).  Your physician has requested that you have cardiac CT. Cardiac computed tomography (CT) is a painless test that uses an x-ray machine to take clear, detailed pictures of your heart. For further information please visit [facility]uge[facility]iesta[employer]tn. Please follow instruction sheet as given.   Follow-Up: Your physician recommends that you schedule a follow-up appointment as needed with Dr. Tamala Julian.    Any Other Special Instructions Will Be Listed Below (If Applicable).     If you need a refill on your cardiac medications before your next appointment, please call your pharmacy.

## 2016-08-08 ENCOUNTER — Other Ambulatory Visit: Payer: Self-pay | Admitting: *Deleted

## 2016-08-08 ENCOUNTER — Encounter: Payer: Self-pay | Admitting: Interventional Cardiology

## 2016-08-08 DIAGNOSIS — Z01812 Encounter for preprocedural laboratory examination: Secondary | ICD-10-CM

## 2016-08-11 ENCOUNTER — Encounter: Payer: Self-pay | Admitting: *Deleted

## 2016-08-11 NOTE — Progress Notes (Signed)
Patient ID: Cameron Cardenas, male   DOB: 1955-03-01, 61 y.o.   MRN: [id number] Patient did not show up for 08/11/16 appointment to have a 48 hour holter monitor applied.

## 2016-08-16 ENCOUNTER — Ambulatory Visit: Payer: BLUE CROSS/BLUE SHIELD | Admitting: Interventional Cardiology

## 2016-08-16 ENCOUNTER — Other Ambulatory Visit: Payer: 59

## 2016-08-23 ENCOUNTER — Ambulatory Visit (HOSPITAL_COMMUNITY): Payer: 59

## 2016-08-25 ENCOUNTER — Ambulatory Visit: Payer: BLUE CROSS/BLUE SHIELD | Admitting: Interventional Cardiology

## 2016-10-05 ENCOUNTER — Other Ambulatory Visit: Payer: Self-pay | Admitting: Endocrinology

## 2016-10-11 ENCOUNTER — Ambulatory Visit: Payer: 59

## 2016-10-11 ENCOUNTER — Other Ambulatory Visit (INDEPENDENT_AMBULATORY_CARE_PROVIDER_SITE_OTHER): Payer: 59

## 2016-10-11 ENCOUNTER — Encounter: Payer: Self-pay | Admitting: Internal Medicine

## 2016-10-11 DIAGNOSIS — E119 Type 2 diabetes mellitus without complications: Secondary | ICD-10-CM

## 2016-10-11 DIAGNOSIS — E785 Hyperlipidemia, unspecified: Secondary | ICD-10-CM | POA: Diagnosis not present

## 2016-10-11 LAB — LIPID PANEL
CHOL/HDL RATIO: 2
Cholesterol: 143 mg/dL (ref 0–200)
HDL: 66.6 mg/dL (ref >39.00)
LDL Cholesterol: 65 mg/dL (ref 0–99)
NONHDL: 76.71
Triglycerides: 57 mg/dL (ref 0.0–149.0)
VLDL: 11.4 mg/dL (ref 0.0–40.0)

## 2016-10-11 LAB — COMPREHENSIVE METABOLIC PANEL
ALK PHOS: 36 U/L — AB (ref 39–117)
ALT: 33 U/L (ref 0–53)
AST: 24 U/L (ref 0–37)
Albumin: 4.2 g/dL (ref 3.5–5.2)
BILIRUBIN TOTAL: 1.2 mg/dL (ref 0.2–1.2)
BUN: 19 mg/dL (ref 6–23)
CO2: 32 meq/L (ref 19–32)
Calcium: 9.5 mg/dL (ref 8.4–10.5)
Chloride: 106 mEq/L (ref 96–112)
Creatinine, Ser: 1.2 mg/dL (ref 0.40–1.50)
GFR: 78.84 mL/min (ref >60.00)
GLUCOSE: 118 mg/dL — AB (ref 70–99)
Potassium: 4.2 mEq/L (ref 3.5–5.1)
SODIUM: 143 meq/L (ref 135–145)
Total Protein: 6.3 g/dL (ref 6.0–8.3)

## 2016-10-11 LAB — HEMOGLOBIN A1C: HEMOGLOBIN A1C: 6.2 % (ref 4.6–6.5)

## 2016-10-14 ENCOUNTER — Ambulatory Visit (INDEPENDENT_AMBULATORY_CARE_PROVIDER_SITE_OTHER): Payer: 59 | Admitting: Endocrinology

## 2016-10-14 ENCOUNTER — Encounter: Payer: Self-pay | Admitting: Endocrinology

## 2016-10-14 VITALS — BP 104/70 | HR 75 | Ht 75.0 in | Wt 203.6 lb

## 2016-10-14 DIAGNOSIS — E119 Type 2 diabetes mellitus without complications: Secondary | ICD-10-CM | POA: Diagnosis not present

## 2016-10-14 DIAGNOSIS — Z23 Encounter for immunization: Secondary | ICD-10-CM

## 2016-10-14 NOTE — Progress Notes (Signed)
Patient ID: Cameron Cardenas, male   DOB: 30-May-1955, 61 y.o.   MRN: [id number]   Reason for Appointment: Endocrinology follow-up   History of Present Illness   Diagnosis: Type 2 DIABETES MELITUS, date of diagnosis: 2004  PAST history: He had mild diabetes at onset and was treated with metformin and subsequently Actoplusmet In 2013 he had changed his diet significantly and started exercising. This helped him with weight loss Subsequently his blood sugars have been excellent with A1c upper normal  RECENT history:  Oral hypoglycemic drugs: Actoplusmet 15/500 twice a day   He is returning for his 6 month follow-up   His blood sugars are fairly well controlled although A1c is slightly higher than usual at 6.2, previously 5.7  Current management, problems and blood sugar patterns:  He did not bring his monitor for download again  He was given a new Verio IQ meter but he has not used this recently because of some technical problems  However he thinks his blood sugars are still fairly close to normal, may be slightly higher in the morning  Lab glucose was 118 fasting  Even though he is not having any further problems now he has not started any regular exercise, he says his work schedule does not allow this  However his weight is slightly better than in July             Side effects from medications: None  Monitors blood glucose infrequently: One Touch  Glucose by recall when he was checking them: 124 am 130s-147;  pm  Physical activity: exercise: bike 0-2/7 days, some walking        Dietician visit: Most recent: 12/13           Smoothies in am for breakfast    Wt Readings from Last 3 Encounters:  10/14/16 203 lb 9.6 oz (92.4 kg)  08/01/16 210 lb (95.3 kg)  07/15/16 208 lb 3.2 oz (94.4 kg)   Lab Results  Component Value Date   HGBA1C 6.2 10/11/2016   HGBA1C 5.7 07/12/2016   HGBA1C 5.7 01/07/2016   Lab Results  Component Value Date   MICROALBUR 0.7  07/12/2016   LDLCALC 65 10/11/2016   CREATININE 1.20 10/11/2016    Lab on 10/11/2016  Component Date Value Ref Range Status  . Hgb A1c MFr Bld 10/11/2016 6.2  4.6 - 6.5 % Final   Glycemic Control Guidelines for People with Diabetes:Non Diabetic:  <6%Goal of Therapy: <7%Additional Action Suggested:  >8%   . Sodium 10/11/2016 143  135 - 145 mEq/L Final  . Potassium 10/11/2016 4.2  3.5 - 5.1 mEq/L Final  . Chloride 10/11/2016 106  96 - 112 mEq/L Final  . CO2 10/11/2016 32  19 - 32 mEq/L Final  . Glucose, Bld 10/11/2016 118* 70 - 99 mg/dL Final  . BUN 10/11/2016 19  6 - 23 mg/dL Final  . Creatinine, Ser 10/11/2016 1.20  0.40 - 1.50 mg/dL Final  . Total Bilirubin 10/11/2016 1.2  0.2 - 1.2 mg/dL Final  . Alkaline Phosphatase 10/11/2016 36* 39 - 117 U/L Final  . AST 10/11/2016 24  0 - 37 U/L Final  . ALT 10/11/2016 33  0 - 53 U/L Final  . Total Protein 10/11/2016 6.3  6.0 - 8.3 g/dL Final  . Albumin 10/11/2016 4.2  3.5 - 5.2 g/dL Final  . Calcium 10/11/2016 9.5  8.4 - 10.5 mg/dL Final  . GFR 10/11/2016 78.84  >60.00 mL/min Final  . Cholesterol 10/11/2016  143  0 - 200 mg/dL Final   ATP III Classification       Desirable:  < 200 mg/dL               Borderline High:  200 - 239 mg/dL          High:  > = 240 mg/dL  . Triglycerides 10/11/2016 57.0  0.0 - 149.0 mg/dL Final   Normal:  <150 mg/dLBorderline High:  150 - 199 mg/dL  . HDL 10/11/2016 66.60  >39.00 mg/dL Final  . VLDL 10/11/2016 11.4  0.0 - 40.0 mg/dL Final  . LDL Cholesterol 10/11/2016 65  0 - 99 mg/dL Final  . Total CHOL/HDL Ratio 10/11/2016 2   Final                  Men          Women1/2 Average Risk     3.4          3.3Average Risk          5.0          4.42X Average Risk          9.6          7.13X Average Risk          15.0          11.0                      . NonHDL 10/11/2016 76.71   Final   NOTE:  Non-HDL goal should be 30 mg/dL higher than patient's LDL goal (i.e. LDL goal of < 70 mg/dL, would have non-HDL goal of < 100  mg/dL)     Allergies as of 10/14/2016      Reactions   Percocet [oxycodone-acetaminophen] Other (See Comments)   Caused hallucinations      Medication List       Accurate as of 10/14/16  8:47 AM. Always use your most recent med list.          aspirin 81 MG tablet Take 81 mg by mouth daily.   ezetimibe 10 MG tablet Commonly known as:  ZETIA Take 1 tablet (10 mg total) by mouth daily.   HYDROcodone-acetaminophen 5-325 MG tablet Commonly known as:  NORCO/VICODIN Take 1 tablet by mouth every 6 (six) hours as needed for moderate pain.   lisinopril-hydrochlorothiazide 10-12.5 MG tablet Commonly known as:  PRINZIDE,ZESTORETIC TAKE 1 TABLET BY MOUTH ONCE DAILY   pioglitazone-metformin 15-500 MG tablet Commonly known as:  ACTOPLUS MET TAKE ONE TABLET BY MOUTH TWICE DAILY   rosuvastatin 20 MG tablet Commonly known as:  CRESTOR Take 1 tablet (20 mg total) by mouth daily.   sildenafil 20 MG tablet Commonly known as:  REVATIO '20mg'$ , 2-3 prn as directed   triamcinolone ointment 0.5 % Commonly known as:  KENALOG Apply 1 application topically 2 (two) times daily.       Allergies:  Allergies  Allergen Reactions  . Percocet [Oxycodone-Acetaminophen] Other (See Comments)    Caused hallucinations    Past Medical History:  Diagnosis Date  . Allergic rhinitis due to pollen   . Essential hypertension, malignant   . Lumbago   . Pure hypercholesterolemia   . Type II or unspecified type diabetes mellitus without mention of complication, not stated as uncontrolled    dx in 2005  . Unspecified vitamin D deficiency     Past Surgical History:  Procedure Laterality Date  . COLONOSCOPY  in [facility], MD no longer in practice, does not recall the name of facility. Does belive polyps were removed  . [place] SURGERY  2011  . MAXIMUM ACCESS (MAS)POSTERIOR LUMBAR INTERBODY FUSION (PLIF) 1 LEVEL N/A 06/10/2014   Procedure: Redo Lumbar Five-Sacral One Decompression with maximum  access posterior lumbar interbody fusion;  Surgeon: Erline Levine, MD;  Location: [facility];  Service: Neurosurgery;  Laterality: N/A;  Redo Decompression with maximum access posterior lumbar interbody fusion, L5-S1    Family History  Problem Relation Age of Onset  . Hypertension Mother   . Hypertension Father   . Diabetes Sister   . Colon cancer Neg Hx   . Esophageal cancer Neg Hx   . Rectal cancer Neg Hx   . Stomach cancer Neg Hx   . Heart disease Neg Hx     Social History:  reports that he quit smoking about 24 years ago. He has never used smokeless tobacco. He reports that he drinks alcohol. He reports that he uses drugs, including Marijuana.   Lab on 10/11/2016  Component Date Value Ref Range Status  . Hgb A1c MFr Bld 10/11/2016 6.2  4.6 - 6.5 % Final   Glycemic Control Guidelines for People with Diabetes:Non Diabetic:  <6%Goal of Therapy: <7%Additional Action Suggested:  >8%   . Sodium 10/11/2016 143  135 - 145 mEq/L Final  . Potassium 10/11/2016 4.2  3.5 - 5.1 mEq/L Final  . Chloride 10/11/2016 106  96 - 112 mEq/L Final  . CO2 10/11/2016 32  19 - 32 mEq/L Final  . Glucose, Bld 10/11/2016 118* 70 - 99 mg/dL Final  . BUN 10/11/2016 19  6 - 23 mg/dL Final  . Creatinine, Ser 10/11/2016 1.20  0.40 - 1.50 mg/dL Final  . Total Bilirubin 10/11/2016 1.2  0.2 - 1.2 mg/dL Final  . Alkaline Phosphatase 10/11/2016 36* 39 - 117 U/L Final  . AST 10/11/2016 24  0 - 37 U/L Final  . ALT 10/11/2016 33  0 - 53 U/L Final  . Total Protein 10/11/2016 6.3  6.0 - 8.3 g/dL Final  . Albumin 10/11/2016 4.2  3.5 - 5.2 g/dL Final  . Calcium 10/11/2016 9.5  8.4 - 10.5 mg/dL Final  . GFR 10/11/2016 78.84  >60.00 mL/min Final  . Cholesterol 10/11/2016 143  0 - 200 mg/dL Final   ATP III Classification       Desirable:  < 200 mg/dL               Borderline High:  200 - 239 mg/dL          High:  > = 240 mg/dL  . Triglycerides 10/11/2016 57.0  0.0 - 149.0 mg/dL Final   Normal:  <150 mg/dLBorderline High:   150 - 199 mg/dL  . HDL 10/11/2016 66.60  >39.00 mg/dL Final  . VLDL 10/11/2016 11.4  0.0 - 40.0 mg/dL Final  . LDL Cholesterol 10/11/2016 65  0 - 99 mg/dL Final  . Total CHOL/HDL Ratio 10/11/2016 2   Final                  Men          Women1/2 Average Risk     3.4          3.3Average Risk          5.0          4.42X Average Risk          9.6  7.13X Average Risk          15.0          11.0                      . NonHDL 10/11/2016 76.71   Final   NOTE:  Non-HDL goal should be 30 mg/dL higher than patient's LDL goal (i.e. LDL goal of < 70 mg/dL, would have non-HDL goal of < 100 mg/dL)    Review of Systems:  HYPERTENSION:   Treated with Zestoretic 10/12.5  HYPERLIPIDEMIA: The lipid abnormality consists of elevated LDL which was 94 previously He had been on 40 mg atorvastatin since 12/13 and this was increased to 80 mg in 6/16 Previously did have LDL particle number of 1348 previously and which was relatively high at 1216 in 6/16  Last LDL was Not adequately controlled since he had been out of his Lipitor which she thought was making feeling tired  With Crestor 20 mg and Zetia daily he is not having any fatigue or aching He thinks he is taking this regularly LDL is significantly better now    Lab Results  Component Value Date   CHOL 143 10/11/2016   HDL 66.60 10/11/2016   LDLCALC 65 10/11/2016   TRIG 57.0 10/11/2016   CHOLHDL 2 10/11/2016      He had a foot exam in 11/17    Examination:   BP 104/70   Pulse 75   Ht '6\' 3"'$  (1.905 m)   Wt 203 lb 9.6 oz (92.4 kg)   SpO2 98%   BMI 25.45 kg/m   Body mass index is 25.45 kg/m.    ASSESSMENT/ PLAN:   Diabetes type 2 with BMI 25  He has had mild diabetes with good control of Actoplusmet 15/500 twice a day A1c is Slightly better at 6.2 but still indicates good control His weight is slightly better Currently not monitoring his blood sugars regularly recently He also can do better with exercise especially when he has  more time from his work   Recommendations:  He will restart taking his blood sugars, may check with the manufacturer if he has any difficulties with the meter  Discussed blood sugar targets at various times including fasting and postprandial  Emphasized need for regular exercise  Follow-up in 4 months  No change in the Actoplusmet but may consider increasing the metformin to 850 mg   HYPERLIPIDEMIA with increased LDL particle number in the past LDL is better controlled with Crestor and Zetia which he is tolerating and will continue   Hypertension: ls controlled with Zestoretic, also continue follow-up with PCP  Patient Instructions  Check blood sugars on waking up    Also check blood sugars about 2 hours after a meal and do this after different meals by rotation  Recommended blood sugar levels on waking up is 90-120 and about 2 hours after meal is 130-160  Please bring your blood sugar monitor to each visit, thank you    [facility] 10/14/2016, 8:47 AM   Influenza vaccine given  Note: This office note was prepared with Dragon voice recognition system technology. Any transcriptional errors that result from this process are unintentional.

## 2016-10-14 NOTE — Patient Instructions (Signed)
Check blood sugars on waking up    Also check blood sugars about 2 hours after a meal and do this after different meals by rotation  Recommended blood sugar levels on waking up is 90-120 and about 2 hours after meal is 130-160  Please bring your blood sugar monitor to each visit, thank you

## 2016-10-22 ENCOUNTER — Other Ambulatory Visit: Payer: Self-pay | Admitting: Endocrinology

## 2016-11-22 LAB — HM DIABETES EYE EXAM

## 2016-11-28 NOTE — Progress Notes (Deleted)
[facility] Note  11/29/2016     CHIEF COMPLAINT Patient presents for No chief complaint on file.   HISTORY OF PRESENT ILLNESS: Cameron Cardenas is a 61 y.o. male who presents to the clinic today for:     Referring physician: Merrilee Seashore, [address], [place] [address]  HISTORICAL INFORMATION:   Selected notes from the MEDICAL RECORD NUMBER Referred by self for DM exam LEE-  Ocular Hx- pseudophakia OU (OS 03/30/2015, Dr. Eliseo Squires, [facility]; OD 09/07/15, Dr. Rachelle Hora, [facility]) PMH- type II DM, HTN, hyperlipidemia, palpations    CURRENT MEDICATIONS: No current outpatient medications on file. (Ophthalmic Drugs)   No current facility-administered medications for this visit.  (Ophthalmic Drugs)   Current Outpatient Medications (Other)  Medication Sig   aspirin 81 MG tablet Take 81 mg by mouth daily.   ezetimibe (ZETIA) 10 MG tablet TAKE 1 TABLET BY MOUTH ONCE DAILY   HYDROcodone-acetaminophen (NORCO/VICODIN) 5-325 MG tablet Take 1 tablet by mouth every 6 (six) hours as needed for moderate pain.   lisinopril-hydrochlorothiazide (PRINZIDE,ZESTORETIC) 10-12.5 MG tablet TAKE 1 TABLET BY MOUTH ONCE DAILY   pioglitazone-metformin (ACTOPLUS MET) 15-500 MG tablet TAKE ONE TABLET BY MOUTH TWICE DAILY   rosuvastatin (CRESTOR) 20 MG tablet Take 1 tablet (20 mg total) by mouth daily.   sildenafil (REVATIO) 20 MG tablet '20mg'$ , 2-3 prn as directed   triamcinolone ointment (KENALOG) 0.5 % Apply 1 application topically 2 (two) times daily.    No current facility-administered medications for this visit.  (Other)      REVIEW OF SYSTEMS:    ALLERGIES Allergies  Allergen Reactions   Percocet [Oxycodone-Acetaminophen] Other (See Comments)    Caused hallucinations    PAST MEDICAL HISTORY Past Medical History:  Diagnosis Date   Allergic rhinitis due to pollen    Essential hypertension, malignant     Lumbago    Pure hypercholesterolemia    Type II or unspecified type diabetes mellitus without mention of complication, not stated as uncontrolled    dx in 2005   Unspecified vitamin D deficiency    Past Surgical History:  Procedure Laterality Date   COLONOSCOPY     in [facility], MD no longer in practice, does not recall the name of facility. Does belive polyps were removed   LUMBAR DISC SURGERY  2011   Redo Lumbar Five-Sacral One Decompression with maximum access posterior lumbar interbody fusion N/A 06/10/2014   Performed by Erline Levine, MD at [facility]    FAMILY HISTORY Family History  Problem Relation Age of Onset   Hypertension Mother    Hypertension Father    Diabetes Sister    Colon cancer Neg Hx    Esophageal cancer Neg Hx    Rectal cancer Neg Hx    Stomach cancer Neg Hx    Heart disease Neg Hx     SOCIAL HISTORY Social History   Tobacco Use   Smoking status: Former Smoker    Last attempt to quit: 08/28/1992    Years since quitting: 24.2   Smokeless tobacco: Never Used  Substance Use Topics   Alcohol use: Yes    Alcohol/week: 0.0 oz    Comment: occassional   Drug use: Yes    Types: Marijuana    Comment: occas.         OPHTHALMIC EXAM:   Not recorded      IMAGING AND PROCEDURES  Imaging and Procedures for 11/29/16  ASSESSMENT/PLAN:    ICD-10-CM   1. 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 Pseudophakia of both eyes Z96.1     1.  2.  3.  Ophthalmic Meds Ordered this visit:  No orders of the defined types were placed in this encounter.      No Follow-up on file.  There are no Patient Instructions on file for this visit.   Explained the diagnoses, plan, and follow up with the patient and they expressed understanding.  Patient expressed understanding of the importance of proper follow up care.   Gardiner Sleeper, M.D., Ph.D. Diseases & Surgery of the Retina and [place] 11/29/16     Abbreviations: M myopia (nearsighted); A astigmatism; H hyperopia (farsighted); P presbyopia; Mrx spectacle prescription;  CTL contact lenses; OD right eye; OS left eye; OU both eyes  XT exotropia; ET esotropia; PEK punctate epithelial keratitis; PEE punctate epithelial erosions; DES dry eye syndrome; MGD meibomian gland dysfunction; ATs artificial tears; PFAT's preservative free artificial tears; [place] nuclear sclerotic cataract; PSC posterior subcapsular cataract; ERM epi-retinal membrane; PVD posterior vitreous detachment; RD retinal detachment; DM diabetes mellitus; DR diabetic retinopathy; NPDR non-proliferative diabetic retinopathy; PDR proliferative diabetic retinopathy; CSME clinically significant macular edema; DME diabetic macular edema; dbh dot blot hemorrhages; CWS cotton wool spot; POAG primary open angle glaucoma; C/D cup-to-disc ratio; HVF humphrey visual field; GVF goldmann visual field; OCT optical coherence tomography; IOP intraocular pressure; BRVO Branch retinal vein occlusion; CRVO central retinal vein occlusion; CRAO central retinal artery occlusion; BRAO branch retinal artery occlusion; RT retinal tear; SB scleral buckle; PPV pars plana vitrectomy; VH Vitreous hemorrhage; PRP panretinal laser photocoagulation; IVK intravitreal kenalog; VMT vitreomacular traction; MH Macular hole;  NVD neovascularization of the disc; NVE neovascularization elsewhere; AREDS age related eye disease study; ARMD age related macular degeneration; POAG primary open angle glaucoma; EBMD epithelial/anterior basement membrane dystrophy; ACIOL anterior chamber intraocular lens; IOL intraocular lens; PCIOL posterior chamber intraocular lens; Phaco/IOL phacoemulsification with intraocular lens placement; [place] photorefractive keratectomy; LASIK laser assisted in situ keratomileusis; HTN hypertension; DM diabetes mellitus; COPD chronic obstructive pulmonary disease

## 2016-11-29 ENCOUNTER — Encounter (INDEPENDENT_AMBULATORY_CARE_PROVIDER_SITE_OTHER): Payer: BLUE CROSS/BLUE SHIELD | Admitting: Ophthalmology

## 2016-12-16 ENCOUNTER — Other Ambulatory Visit: Payer: Self-pay | Admitting: Endocrinology

## 2016-12-16 ENCOUNTER — Telehealth: Payer: Self-pay

## 2016-12-16 NOTE — Telephone Encounter (Signed)
Called patient and let him know that I have sent his prescriptions to the [facility] for him.

## 2016-12-16 NOTE — Telephone Encounter (Signed)
Pt needs [name] to sent these perscription over to pharmacy   lisinopril-hydrochlorothiazide (PRINZIDE,ZESTORETIC) 10-12.5 MG tablet  rosuvastatin (CRESTOR) 20 MG tablet    pioglitazone-metformin (ACTOPLUS MET) 15-500 MG tablet    [place] ([place]), [place]  Please call PT once they are sent to pharmacy

## 2016-12-16 NOTE — Telephone Encounter (Signed)
I have already sent in these prescriptions and have let the patient know.

## 2017-01-04 ENCOUNTER — Encounter: Payer: Self-pay | Admitting: Internal Medicine

## 2017-01-22 ENCOUNTER — Other Ambulatory Visit: Payer: Self-pay | Admitting: Internal Medicine

## 2017-01-22 DIAGNOSIS — N529 Male erectile dysfunction, unspecified: Secondary | ICD-10-CM

## 2017-01-25 ENCOUNTER — Encounter: Payer: Self-pay | Admitting: Podiatry

## 2017-01-25 ENCOUNTER — Ambulatory Visit: Payer: PRIVATE HEALTH INSURANCE | Admitting: Podiatry

## 2017-01-25 DIAGNOSIS — M722 Plantar fascial fibromatosis: Secondary | ICD-10-CM

## 2017-01-25 DIAGNOSIS — E1151 Type 2 diabetes mellitus with diabetic peripheral angiopathy without gangrene: Secondary | ICD-10-CM | POA: Diagnosis not present

## 2017-01-25 DIAGNOSIS — B351 Tinea unguium: Secondary | ICD-10-CM | POA: Diagnosis not present

## 2017-01-25 MED ORDER — TERBINAFINE HCL 250 MG PO TABS: ORAL_TABLET | ORAL | 0 refills | Status: DC

## 2017-01-25 MED ORDER — TRIAMCINOLONE ACETONIDE 10 MG/ML IJ SUSP
10.0000 mg | Freq: Once | INTRAMUSCULAR | Status: AC
  Administered 2017-01-25: 10 mg

## 2017-01-26 NOTE — Progress Notes (Signed)
Subjective:   Patient ID: Cameron Cardenas, male   DOB: 62 y.o.   MRN: [id number]   HPI Long-term diabetic with moderate neurological symptoms with chronic discomfort plantar fascia left and also elongated nailbeds 1-5 both feet they get painful and make shoe gear difficult   ROS      Objective:  Physical Exam  Neurovascular status unchanged with thick yellow nailbeds 1 5 both feet that become painful and pain in the plantar heel left with flatfoot deformity     Assessment:  At risk diabetic with fasciitis mycotic nail infections with pain 1-5 both feet     Plan:  H&P discussion commenced and today I injected the plantar fascial left 3 mg Kenalog 5 mg Xylocaine we went ahead and scanned for new pair of orthotics and I debrided nailbeds 1-5 both feet with no iatrogenic bleeding noted

## 2017-02-21 ENCOUNTER — Encounter: Payer: Self-pay | Admitting: Internal Medicine

## 2017-02-21 ENCOUNTER — Other Ambulatory Visit: Payer: PRIVATE HEALTH INSURANCE | Admitting: Orthotics

## 2017-03-07 ENCOUNTER — Encounter: Payer: 59 | Admitting: Internal Medicine

## 2017-03-09 ENCOUNTER — Other Ambulatory Visit: Payer: Self-pay | Admitting: Endocrinology

## 2017-03-15 ENCOUNTER — Other Ambulatory Visit: Payer: Self-pay | Admitting: Endocrinology

## 2017-03-23 ENCOUNTER — Encounter: Payer: Self-pay | Admitting: Podiatry

## 2017-03-23 ENCOUNTER — Ambulatory Visit (INDEPENDENT_AMBULATORY_CARE_PROVIDER_SITE_OTHER): Payer: PRIVATE HEALTH INSURANCE

## 2017-03-23 ENCOUNTER — Ambulatory Visit: Payer: PRIVATE HEALTH INSURANCE | Admitting: Podiatry

## 2017-03-23 ENCOUNTER — Other Ambulatory Visit: Payer: Self-pay | Admitting: Podiatry

## 2017-03-23 DIAGNOSIS — M79672 Pain in left foot: Secondary | ICD-10-CM

## 2017-03-23 DIAGNOSIS — M79671 Pain in right foot: Secondary | ICD-10-CM

## 2017-03-23 DIAGNOSIS — M722 Plantar fascial fibromatosis: Secondary | ICD-10-CM | POA: Diagnosis not present

## 2017-03-23 DIAGNOSIS — M779 Enthesopathy, unspecified: Secondary | ICD-10-CM

## 2017-03-23 DIAGNOSIS — E1151 Type 2 diabetes mellitus with diabetic peripheral angiopathy without gangrene: Secondary | ICD-10-CM

## 2017-03-23 MED ORDER — TRIAMCINOLONE ACETONIDE 10 MG/ML IJ SUSP
10.0000 mg | Freq: Once | INTRAMUSCULAR | Status: AC
  Administered 2017-03-23: 10 mg

## 2017-03-23 NOTE — Progress Notes (Signed)
Subjective:   Patient ID: Cameron Cardenas, male   DOB: 62 y.o.   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  Patient will be seen back for fitting and may or may not be able to return to normal job and at this point we are continuing to try to defer surgery for this patient

## 2017-03-31 ENCOUNTER — Other Ambulatory Visit: Payer: Self-pay | Admitting: Endocrinology

## 2017-04-12 ENCOUNTER — Other Ambulatory Visit: Payer: Self-pay | Admitting: Internal Medicine

## 2017-04-12 DIAGNOSIS — M5416 Radiculopathy, lumbar region: Secondary | ICD-10-CM

## 2017-04-12 DIAGNOSIS — M545 Low back pain, unspecified: Secondary | ICD-10-CM

## 2017-04-14 ENCOUNTER — Other Ambulatory Visit (INDEPENDENT_AMBULATORY_CARE_PROVIDER_SITE_OTHER): Payer: PRIVATE HEALTH INSURANCE

## 2017-04-14 DIAGNOSIS — E119 Type 2 diabetes mellitus without complications: Secondary | ICD-10-CM

## 2017-04-14 LAB — BASIC METABOLIC PANEL
BUN: 17 mg/dL (ref 6–23)
CALCIUM: 8.8 mg/dL (ref 8.4–10.5)
CHLORIDE: 108 meq/L (ref 96–112)
CO2: 30 meq/L (ref 19–32)
CREATININE: 1.16 mg/dL (ref 0.40–1.50)
GFR: 81.85 mL/min (ref >60.00)
GLUCOSE: 106 mg/dL — AB (ref 70–99)
Potassium: 4.4 mEq/L (ref 3.5–5.1)
SODIUM: 143 meq/L (ref 135–145)

## 2017-04-14 LAB — HEMOGLOBIN A1C: Hgb A1c MFr Bld: 6.1 % (ref 4.6–6.5)

## 2017-04-17 ENCOUNTER — Ambulatory Visit: Payer: PRIVATE HEALTH INSURANCE | Admitting: Orthotics

## 2017-04-17 DIAGNOSIS — M722 Plantar fascial fibromatosis: Secondary | ICD-10-CM

## 2017-04-17 DIAGNOSIS — M79672 Pain in left foot: Secondary | ICD-10-CM

## 2017-04-17 NOTE — Progress Notes (Signed)
Patient not happy with f/o as they were ordered; wants something similar to past with suborthlene shell and eva post, add ppt cushion heel cushion and punch.Marland KitchenMarland KitchenMarland Kitchen

## 2017-04-17 NOTE — Progress Notes (Deleted)
Patient ID: Cameron Cardenas, male   DOB: 12-15-1955, 62 y.o.   MRN: [id number]   Reason for Appointment: Endocrinology follow-up   History of Present Illness   Diagnosis: Type 2 DIABETES MELITUS, date of diagnosis: 2004  PAST history: He had mild diabetes at onset and was treated with metformin and subsequently Actoplusmet In 2013 he had changed his diet significantly and started exercising. This helped him with weight loss Subsequently his blood sugars have been excellent with A1c upper normal  RECENT history:  Oral hypoglycemic drugs: Actoplusmet 15/500 twice a day   He is returning for his 6 month follow-up   His blood sugars are fairly well controlled although A1c is slightly higher than usual at 6.2, previously 5.7  Current management, problems and blood sugar patterns:  He did not bring his monitor for download again  He was given a new Verio IQ meter but he has not used this recently because of some technical problems  However he thinks his blood sugars are still fairly close to normal, may be slightly higher in the morning  Lab glucose was 118 fasting  Even though he is not having any further problems now he has not started any regular exercise, he says his work schedule does not allow this  However his weight is slightly better than in July             Side effects from medications: None  Monitors blood glucose infrequently: One Touch  Glucose by recall when he was checking them: 124 am 130s-147;  pm  Physical activity: exercise: bike 0-2/7 days, some walking        Dietician visit: Most recent: 12/13           Smoothies in am for breakfast    Wt Readings from Last 3 Encounters:  10/14/16 203 lb 9.6 oz (92.4 kg)  08/01/16 210 lb (95.3 kg)  07/15/16 208 lb 3.2 oz (94.4 kg)   Lab Results  Component Value Date   HGBA1C 6.1 04/14/2017   HGBA1C 6.2 10/11/2016   HGBA1C 5.7 07/12/2016   Lab Results  Component Value Date   MICROALBUR 0.7  07/12/2016   LDLCALC 65 10/11/2016   CREATININE 1.16 04/14/2017    Lab on 04/14/2017  Component Date Value Ref Range Status  . Sodium 04/14/2017 143  135 - 145 mEq/L Final  . Potassium 04/14/2017 4.4  3.5 - 5.1 mEq/L Final  . Chloride 04/14/2017 108  96 - 112 mEq/L Final  . CO2 04/14/2017 30  19 - 32 mEq/L Final  . Glucose, Bld 04/14/2017 106* 70 - 99 mg/dL Final  . BUN 04/14/2017 17  6 - 23 mg/dL Final  . Creatinine, Ser 04/14/2017 1.16  0.40 - 1.50 mg/dL Final  . Calcium 04/14/2017 8.8  8.4 - 10.5 mg/dL Final  . GFR 04/14/2017 81.85  >60.00 mL/min Final  . Hgb A1c MFr Bld 04/14/2017 6.1  4.6 - 6.5 % Final   Glycemic Control Guidelines for People with Diabetes:Non Diabetic:  <6%Goal of Therapy: <7%Additional Action Suggested:  >8%      Allergies as of 04/18/2017      Reactions   Percocet [oxycodone-acetaminophen] Other (See Comments)   Caused hallucinations      Medication List        Accurate as of 04/17/17  8:07 PM. Always use your most recent med list.          aspirin 81 MG tablet Take 81 mg by mouth daily.  ezetimibe 10 MG tablet Commonly known as:  ZETIA TAKE 1 TABLET BY MOUTH ONCE DAILY   HYDROcodone-acetaminophen 5-325 MG tablet Commonly known as:  NORCO/VICODIN Take 1 tablet by mouth every 6 (six) hours as needed for moderate pain.   lisinopril-hydrochlorothiazide 10-12.5 MG tablet Commonly known as:  PRINZIDE,ZESTORETIC TAKE 1 TABLET BY MOUTH ONCE DAILY   pioglitazone-metformin 15-500 MG tablet Commonly known as:  ACTOPLUS MET TAKE 1 TABLET BY MOUTH TWICE DAILY   rosuvastatin 20 MG tablet Commonly known as:  CRESTOR TAKE 1 TABLET BY MOUTH ONCE DAILY   sildenafil 20 MG tablet Commonly known as:  REVATIO 48m, 2-3 prn as directed   sildenafil 20 MG tablet Commonly known as:  REVATIO USE AS DIRECTED   terbinafine 250 MG tablet Commonly known as:  LAMISIL Please take one a day x 7days, repeat every 4 weeks x 4 months   triamcinolone ointment  0.5 % Commonly known as:  KENALOG Apply 1 application topically 2 (two) times daily.       Allergies:  Allergies  Allergen Reactions  . Percocet [Oxycodone-Acetaminophen] Other (See Comments)    Caused hallucinations    Past Medical History:  Diagnosis Date  . Allergic rhinitis due to pollen   . Essential hypertension, malignant   . Lumbago   . Pure hypercholesterolemia   . Type II or unspecified type diabetes mellitus without mention of complication, not stated as uncontrolled    dx in 2005  . Unspecified vitamin D deficiency     Past Surgical History:  Procedure Laterality Date  [facility], does not recall the name of facility. Does belive polyps were removed  [place] [facility]  [place]  . MAXIMUM ACCESS (MAS)POSTERIOR LUMBAR INTERBODY FUSION (PLIF) 1 LEVEL N/A 06/10/2014   Procedure: Redo Lumbar Five-Sacral One Decompression with maximum access posterior lumbar interbody fusion;  Surgeon: JErline Levine MD;  [facility];  Service: Neurosurgery;  Laterality: N/A;  Redo Decompression with maximum access posterior lumbar interbody fusion, L5-S1    Family History  Problem Relation Age of Onset  . Hypertension Mother   . Hypertension Father   . Diabetes Sister   . Colon cancer Neg Hx   . Esophageal cancer Neg Hx   . Rectal cancer Neg Hx   . Stomach cancer Neg Hx   . Heart disease Neg Hx     Social History:  reports that he quit smoking about 24 years ago. He has never used smokeless tobacco. He reports that he drinks alcohol. He reports that he has current or past drug history. Drug: Marijuana.   Lab on 04/14/2017  Component Date Value Ref Range Status  . Sodium 04/14/2017 143  135 - 145 mEq/L Final  . Potassium 04/14/2017 4.4  3.5 - 5.1 mEq/L Final  . Chloride 04/14/2017 108  96 - 112 mEq/L Final  . CO2 04/14/2017 30  19 - 32 mEq/L Final  . Glucose, Bld 04/14/2017 106* 70 - 99 mg/dL Final  . BUN 04/14/2017 17  6 -  23 mg/dL Final  . Creatinine, Ser 04/14/2017 1.16  0.40 - 1.50 mg/dL Final  . Calcium 04/14/2017 8.8  8.4 - 10.5 mg/dL Final  . GFR 04/14/2017 81.85  >60.00 mL/min Final  . Hgb A1c MFr Bld 04/14/2017 6.1  4.6 - 6.5 % Final   Glycemic Control Guidelines for People with Diabetes:Non Diabetic:  <6%Goal of Therapy: <7%Additional Action Suggested:  >8%  Review of Systems:  HYPERTENSION:   Treated with Zestoretic 10/12.5  HYPERLIPIDEMIA: The lipid abnormality consists of elevated LDL which was 94 previously He had been on 40 mg atorvastatin since 12/13 and this was increased to 80 mg in 6/16 Previously did have LDL particle number of 1348 previously and which was relatively high at 1216 in 6/16  Last LDL was Not adequately controlled since he had been out of his Lipitor which she thought was making feeling tired  With Crestor 20 mg and Zetia daily he is not having any fatigue or aching He thinks he is taking this regularly LDL is significantly better now    Lab Results  Component Value Date   CHOL 143 10/11/2016   HDL 66.60 10/11/2016   LDLCALC 65 10/11/2016   TRIG 57.0 10/11/2016   CHOLHDL 2 10/11/2016      He had a foot exam in 11/17    Examination:   There were no vitals taken for this visit.  There is no height or weight on file to calculate BMI.    ASSESSMENT/ PLAN:   Diabetes type 2 with BMI 25  He has had mild diabetes with good control of Actoplusmet 15/500 twice a day A1c is Slightly better at 6.2 but still indicates good control His weight is slightly better Currently not monitoring his blood sugars regularly recently He also can do better with exercise especially when he has more time from his work   Recommendations:  He will restart taking his blood sugars, may check with the manufacturer if he has any difficulties with the meter  Discussed blood sugar targets at various times including fasting and postprandial  Emphasized need for regular  exercise  Follow-up in 4 months  No change in the Actoplusmet but may consider increasing the metformin to 850 mg   HYPERLIPIDEMIA with increased LDL particle number in the past LDL is better controlled with Crestor and Zetia which he is tolerating and will continue   Hypertension: ls controlled with Zestoretic, also continue follow-up with PCP  There are no Patient Instructions on file for this visit.  Elayne Snare 04/17/2017, 8:07 PM     Note: This office note was prepared with Dragon voice recognition system technology. Any transcriptional errors that result from this process are unintentional.

## 2017-04-18 ENCOUNTER — Ambulatory Visit: Payer: 59 | Admitting: Endocrinology

## 2017-04-21 ENCOUNTER — Encounter: Payer: Self-pay | Admitting: Endocrinology

## 2017-04-21 ENCOUNTER — Ambulatory Visit (INDEPENDENT_AMBULATORY_CARE_PROVIDER_SITE_OTHER): Payer: PRIVATE HEALTH INSURANCE | Admitting: Endocrinology

## 2017-04-21 VITALS — BP 110/78 | HR 64 | Ht 75.0 in | Wt 204.0 lb

## 2017-04-21 DIAGNOSIS — E119 Type 2 diabetes mellitus without complications: Secondary | ICD-10-CM

## 2017-04-21 NOTE — Progress Notes (Signed)
Patient ID: Cameron Cardenas, male   DOB: 1955/07/22, 62 y.o.   MRN: [id number]   Reason for Appointment: Endocrinology follow-up   History of Present Illness   Diagnosis: Type 2 DIABETES MELITUS, date of diagnosis: 2004  PAST history: He had mild diabetes at onset and was treated with metformin and subsequently Actoplusmet In 2013 he had changed his diet significantly and started exercising. This helped him with weight loss Subsequently his blood sugars have been excellent with A1c upper normal  RECENT history:   Oral hypoglycemic drugs: Actoplusmet 15/500 twice a day   He is returning for his 6 month follow-up   His A1c is stable at 6.1, previously 6.2  Current management, problems and blood sugar patterns:  He did not bring his monitor for download again  He has had some difficulty with exercising because of back pain but is trying to do some walking  Has been able to maintain his weight since his last visit  He is aware of his diet modifications and is doing quite well with this usually; sometimes will have only protein shakes in the morning for breakfast  Most likely is checking blood sugars very sporadically and he does not think that they are high even after meals             Side effects from medications: None  Monitors blood glucose infrequently: One Touch  Glucose by recall: <150 even after meals  Physical activity: exercise: Trying to do some walking        Dietician visit: Most recent: 12/13               Wt Readings from Last 3 Encounters:  04/21/17 204 lb (92.5 kg)  10/14/16 203 lb 9.6 oz (92.4 kg)  08/01/16 210 lb (95.3 kg)   Lab Results  Component Value Date   HGBA1C 6.1 04/14/2017   HGBA1C 6.2 10/11/2016   HGBA1C 5.7 07/12/2016   Lab Results  Component Value Date   MICROALBUR 0.7 07/12/2016   LDLCALC 65 10/11/2016   CREATININE 1.16 04/14/2017    No visits with results within 1 Week(s) from this visit.  Latest known visit with  results is:  Lab on 04/14/2017  Component Date Value Ref Range Status  . Sodium 04/14/2017 143  135 - 145 mEq/L Final  . Potassium 04/14/2017 4.4  3.5 - 5.1 mEq/L Final  . Chloride 04/14/2017 108  96 - 112 mEq/L Final  . CO2 04/14/2017 30  19 - 32 mEq/L Final  . Glucose, Bld 04/14/2017 106* 70 - 99 mg/dL Final  . BUN 04/14/2017 17  6 - 23 mg/dL Final  . Creatinine, Ser 04/14/2017 1.16  0.40 - 1.50 mg/dL Final  . Calcium 04/14/2017 8.8  8.4 - 10.5 mg/dL Final  . GFR 04/14/2017 81.85  >60.00 mL/min Final  . Hgb A1c MFr Bld 04/14/2017 6.1  4.6 - 6.5 % Final   Glycemic Control Guidelines for People with Diabetes:Non Diabetic:  <6%Goal of Therapy: <7%Additional Action Suggested:  >8%      Allergies as of 04/21/2017      Reactions   Percocet [oxycodone-acetaminophen] Other (See Comments)   Caused hallucinations      Medication List        Accurate as of 04/21/17  1:31 PM. Always use your most recent med list.          aspirin 81 MG tablet Take 81 mg by mouth daily.   ezetimibe 10 MG tablet Commonly known  as:  ZETIA TAKE 1 TABLET BY MOUTH ONCE DAILY   HYDROcodone-acetaminophen 5-325 MG tablet Commonly known as:  NORCO/VICODIN Take 1 tablet by mouth every 6 (six) hours as needed for moderate pain.   lisinopril-hydrochlorothiazide 10-12.5 MG tablet Commonly known as:  PRINZIDE,ZESTORETIC TAKE 1 TABLET BY MOUTH ONCE DAILY   pioglitazone-metformin 15-500 MG tablet Commonly known as:  ACTOPLUS MET TAKE 1 TABLET BY MOUTH TWICE DAILY   rosuvastatin 20 MG tablet Commonly known as:  CRESTOR TAKE 1 TABLET BY MOUTH ONCE DAILY   sildenafil 20 MG tablet Commonly known as:  REVATIO '20mg'$ , 2-3 prn as directed   sildenafil 20 MG tablet Commonly known as:  REVATIO USE AS DIRECTED   terbinafine 250 MG tablet Commonly known as:  LAMISIL Please take one a day x 7days, repeat every 4 weeks x 4 months   triamcinolone ointment 0.5 % Commonly known as:  KENALOG Apply 1 application  topically 2 (two) times daily.       Allergies:  Allergies  Allergen Reactions  . Percocet [Oxycodone-Acetaminophen] Other (See Comments)    Caused hallucinations    Past Medical History:  Diagnosis Date  . Allergic rhinitis due to pollen   . Essential hypertension, malignant   . Lumbago   . Pure hypercholesterolemia   . Type II or unspecified type diabetes mellitus without mention of complication, not stated as uncontrolled    dx in 2005  . Unspecified vitamin D deficiency     Past Surgical History:  Procedure Laterality Date  . COLONOSCOPY     in [facility], MD no longer in practice, does not recall the name of facility. Does belive polyps were removed  . [place] [facility]  2011  . MAXIMUM ACCESS (MAS)POSTERIOR LUMBAR INTERBODY FUSION (PLIF) 1 LEVEL N/A 06/10/2014   Procedure: Redo Lumbar Five-Sacral One Decompression with maximum access posterior lumbar interbody fusion;  Surgeon: Erline Levine, MD;  Location: [place] [facility];  Service: Neurosurgery;  Laterality: N/A;  Redo Decompression with maximum access posterior lumbar interbody fusion, L5-S1    Family History  Problem Relation Age of Onset  . Hypertension Mother   . Hypertension Father   . Diabetes Sister   . Colon cancer Neg Hx   . Esophageal cancer Neg Hx   . Rectal cancer Neg Hx   . Stomach cancer Neg Hx   . Heart disease Neg Hx     Social History:  reports that he quit smoking about 24 years ago. He has never used smokeless tobacco. He reports that he drinks alcohol. He reports that he has current or past drug history. Drug: Marijuana.   No visits with results within 1 Week(s) from this visit.  Latest known visit with results is:  Lab on 04/14/2017  Component Date Value Ref Range Status  . Sodium 04/14/2017 143  135 - 145 mEq/L Final  . Potassium 04/14/2017 4.4  3.5 - 5.1 mEq/L Final  . Chloride 04/14/2017 108  96 - 112 mEq/L Final  . CO2 04/14/2017 30  19 - 32 mEq/L Final  . Glucose, Bld 04/14/2017 106* 70 -  99 mg/dL Final  . BUN 04/14/2017 17  6 - 23 mg/dL Final  . Creatinine, Ser 04/14/2017 1.16  0.40 - 1.50 mg/dL Final  . Calcium 04/14/2017 8.8  8.4 - 10.5 mg/dL Final  . GFR 04/14/2017 81.85  >60.00 mL/min Final  . Hgb A1c MFr Bld 04/14/2017 6.1  4.6 - 6.5 % Final   Glycemic Control Guidelines for People with  Diabetes:Non Diabetic:  <6%Goal of Therapy: <7%Additional Action Suggested:  >8%     Review of Systems:  HYPERTENSION:   Treated with Zestoretic 10/12.5  HYPERLIPIDEMIA: The lipid abnormality consists of elevated LDL which was 94 previously He had been on 40 mg atorvastatin since 12/13 and this was increased to 80 mg in 6/16 Previously did have LDL particle number of 1348 previously and which was relatively high at 1216 in 6/16  With Crestor 20 mg and Zetia daily he is not having any fatigue or aching He thinks he is taking this regularly LDL is significantly better now    Lab Results  Component Value Date   CHOL 143 10/11/2016   HDL 66.60 10/11/2016   LDLCALC 65 10/11/2016   TRIG 57.0 10/11/2016   CHOLHDL 2 10/11/2016      He had a foot exam in 12/18    Examination:   BP 110/78 (BP Location: Left Arm, Patient Position: Sitting, Cuff Size: Normal)   Pulse 64   Ht '6\' 3"'$  (1.905 m)   Wt 204 lb (92.5 kg)   SpO2 98%   BMI 25.50 kg/m   Body mass index is 25.5 kg/m.    ASSESSMENT/ PLAN:   Diabetes type 2 with BMI 25  He has had mild diabetes with consistently good control of Actoplusmet 15/500 twice a day  His A1c is again upper normal in both his lab and home glucose readings look fairly good Again not checking blood sugars very much and does not bring his monitor for download He is trying to do fairly well with his lifestyle changes with diet and exercise although has limitation because of back pain   Recommendations:  He will bring his monitor for download on each visit  He can check every other day or so with alternating fasting and postprandial  readings  He can continue the same dose of Actoplusmet  Discussed that if he does get steroids for his back pain he may need to check his blood sugar closely and call us if unusually high   HYPERLIPIDEMIA with increased LDL particle number in the past LDL is controlled with Crestor and Zetia  He will continue to be followed by PCP   Hypertension: ls controlled with Zestoretic, continue follow-up with PCP   There are no Patient Instructions on file for this visit.  Elayne Snare 04/21/2017, 1:31 PM     Note: This office note was prepared with Dragon voice recognition system technology. Any transcriptional errors that result from this process are unintentional.

## 2017-05-01 ENCOUNTER — Ambulatory Visit: Payer: PRIVATE HEALTH INSURANCE | Admitting: Orthotics

## 2017-05-01 ENCOUNTER — Other Ambulatory Visit: Payer: PRIVATE HEALTH INSURANCE | Admitting: Orthotics

## 2017-05-01 DIAGNOSIS — M722 Plantar fascial fibromatosis: Secondary | ICD-10-CM

## 2017-05-01 DIAGNOSIS — E1151 Type 2 diabetes mellitus with diabetic peripheral angiopathy without gangrene: Secondary | ICD-10-CM

## 2017-05-01 NOTE — Progress Notes (Signed)
Patient came in today to p/up functional foot orthotics.   The orthotics were assessed to both fit and function.  The F/O addressed the biomechanical issues/pathologies as intended, offering good longitudinal arch support, proper offloading, and foot support. There weren't any signs of discomfort or irritation.  The F/O fit properly in footwear with minimal trimming/adjustments. 

## 2017-05-04 ENCOUNTER — Ambulatory Visit: Payer: PRIVATE HEALTH INSURANCE | Admitting: Podiatry

## 2017-05-08 ENCOUNTER — Other Ambulatory Visit: Payer: 59

## 2017-05-16 ENCOUNTER — Other Ambulatory Visit: Payer: Self-pay | Admitting: Internal Medicine

## 2017-05-16 DIAGNOSIS — M545 Low back pain, unspecified: Secondary | ICD-10-CM

## 2017-05-16 DIAGNOSIS — M5416 Radiculopathy, lumbar region: Secondary | ICD-10-CM

## 2017-05-18 ENCOUNTER — Ambulatory Visit: Payer: 59 | Admitting: Internal Medicine

## 2017-05-22 ENCOUNTER — Ambulatory Visit
Admission: RE | Admit: 2017-05-22 | Discharge: 2017-05-22 | Disposition: A | Discharge status: Home / Self Care | Payer: PRIVATE HEALTH INSURANCE | Source: Ambulatory Visit | Attending: Internal Medicine | Admitting: Internal Medicine

## 2017-05-22 DIAGNOSIS — M5416 Radiculopathy, lumbar region: Secondary | ICD-10-CM

## 2017-05-22 DIAGNOSIS — M545 Low back pain, unspecified: Secondary | ICD-10-CM

## 2017-05-22 MED ORDER — GADOBENATE DIMEGLUMINE 529 MG/ML IV SOLN
18.0000 mL | Freq: Once | INTRAVENOUS | Status: AC | PRN
  Administered 2017-05-22: 18 mL via INTRAVENOUS

## 2017-05-29 ENCOUNTER — Other Ambulatory Visit: Payer: Self-pay | Admitting: Endocrinology

## 2017-06-07 ENCOUNTER — Ambulatory Visit: Payer: 59 | Admitting: Internal Medicine

## 2017-06-07 ENCOUNTER — Encounter: Payer: Self-pay | Admitting: Internal Medicine

## 2017-06-07 VITALS — BP 118/64 | HR 70 | Ht 76.0 in | Wt 192.4 lb

## 2017-06-07 DIAGNOSIS — K625 Hemorrhage of anus and rectum: Secondary | ICD-10-CM | POA: Diagnosis not present

## 2017-06-07 DIAGNOSIS — Z86010 Personal history of colonic polyps: Secondary | ICD-10-CM | POA: Diagnosis not present

## 2017-06-07 NOTE — Patient Instructions (Addendum)
You have been scheduled for a colonoscopy. Please follow written instructions given to you at your visit today.  Please pick up your prep supplies at the pharmacy within the next 1-3 days. If you use inhalers (even only as needed), please bring them with you on the day of your procedure. Your physician has requested that you go to www.startemmi.com and enter the access code given to you at your visit today. This web site gives a general overview about your procedure. However, you should still follow specific instructions given to you by our office regarding your preparation for the procedure.  If you are age 79 or younger, your body mass index should be between 19-25. Your Body mass index is 23.42 kg/m. If this is out of the aformentioned range listed, please consider follow up with your Primary Care Provider.    I appreciate the opportunity to care for you. Silvano Rusk, MD, [facility]

## 2017-06-07 NOTE — Progress Notes (Signed)
Cameron Cardenas 62 y.o. Jun 23, 1955 [id number] Referred by: Merrilee Seashore, MD  Assessment & Plan:   Encounter Diagnoses  Name Primary?  . Rectal bleeding Yes  . Hx of colonic polyps     Schedule colonoscopy to evaluate the rectal bleeding.  History of colon polyps though not documented in our records per the patient is additional impetus to perform colonoscopy.    The risks and benefits as well as alternatives of endoscopic procedure(s) have been discussed and reviewed. All questions answered. The patient agrees to proceed. CC: Merrilee Seashore, MD   Subjective:   Chief Complaint: Rectal bleeding  HPI The patient has been having some rectal bleeding lately, he was seen by Dr. Ashby Dawes of primary care in February and he reported intermittent bright red blood per rectum passing with the stool mainly with wiping though.  He said mild rectal discomfort no protrusions from the anus.  It  has happened more than 5 times since the beginning of this year.  He thinks it started after a long trip to [place] in the car.  He is moving his bowels without significant difficulty and he is noted benefit from Anusol hydrocortisone cream.  He has a history of having had a colonoscopy here, normal in 2014 at which time he reported having a history of colon polyps.  We had planned a 5-year follow-up given the history though I did not have documentation he is very confident he had polyps removed at [facility] prior to the colonoscopy with me.  GI review of systems otherwise negative at this time. Allergies  Allergen Reactions  . Percocet [Oxycodone-Acetaminophen] Other (See Comments)    Caused hallucinations   Current Meds  Medication Sig  . aspirin 81 MG tablet Take 81 mg by mouth daily.  Marland Kitchen ezetimibe (ZETIA) 10 MG tablet TAKE 1 TABLET BY MOUTH ONCE DAILY  . HYDROcodone-acetaminophen (NORCO/VICODIN) 5-325 MG tablet Take 1 tablet by mouth every 6 (six) hours as needed for  moderate pain.  Marland Kitchen lisinopril-hydrochlorothiazide (PRINZIDE,ZESTORETIC) 10-12.5 MG tablet TAKE 1 TABLET BY MOUTH ONCE DAILY  . pioglitazone-metformin (ACTOPLUS MET) 15-500 MG tablet TAKE 1 TABLET BY MOUTH TWICE DAILY  . rosuvastatin (CRESTOR) 20 MG tablet TAKE 1 TABLET BY MOUTH ONCE DAILY  . sildenafil (REVATIO) 20 MG tablet '20mg'$ , 2-3 prn as directed  . terbinafine (LAMISIL) 250 MG tablet Please take one a day x 7days, repeat every 4 weeks x 4 months  . triamcinolone ointment (KENALOG) 0.5 % Apply 1 application topically 2 (two) times daily.    Past Medical History:  Diagnosis Date  . Allergic rhinitis due to pollen   . DDD (degenerative disc disease), lumbar   . Essential hypertension, malignant   . Hemorrhoids   . Lumbago   . Plantar fasciitis of left foot   . Pure hypercholesterolemia   . Type II or unspecified type diabetes mellitus without mention of complication, not stated as uncontrolled    dx in 2005  . Unspecified vitamin D deficiency    Past Surgical History:  Procedure Laterality Date  . COLONOSCOPY     in [facility], MD no longer in practice, does not recall the name of facility. Does belive polyps were removed  . [place] [facility]ERY  2011  . MAXIMUM ACCESS (MAS)POSTERIOR LUMBAR INTERBODY FUSION (PLIF) 1 LEVEL N/A 06/10/2014   Procedure: Redo Lumbar Five-Sacral One Decompression with maximum access posterior lumbar interbody fusion;  Surgeon: Erline Levine, MD;  Location: [place] [facility];  Service: Neurosurgery;  Laterality: N/A;  Redo Decompression with maximum access posterior lumbar interbody fusion, L5-S1   Social History   Social History Narrative   Employed in [employer]   Married   Occasional alcohol and marijuana no tobacco   family history includes Diabetes in his sister; Hypertension in his father and mother; Seizures in his brother.   Review of Systems As per HPI.  Remainder the review of systems is negative at this time.  February primary care note 2014  colonoscopy  Objective:   Physical Exam '@BP'$  118/64   Pulse 70   Ht '6\' 4"'$  (1.93 m)   Wt 192 lb 6 oz (87.3 kg)   BMI 23.42 kg/m @  General:  Well-developed, well-nourished and in no acute distress Eyes:  anicteric. ENT:   Mouth and posterior pharynx free of lesions.  Lungs: Clear to auscultation bilaterally. Heart:   S1S2, no rubs, murmurs, gallops. Abdomen:  soft, non-tender, no hepatosplenomegaly, hernia, or mass and BS+.  Rectal: Deferred until colonoscopy   DataReviewed: February primary care note 2014 colonoscopy

## 2017-06-09 ENCOUNTER — Other Ambulatory Visit: Payer: Self-pay | Admitting: Endocrinology

## 2017-06-27 DIAGNOSIS — M79676 Pain in unspecified toe(s): Secondary | ICD-10-CM

## 2017-06-29 ENCOUNTER — Telehealth: Payer: Self-pay | Admitting: Endocrinology

## 2017-06-29 NOTE — Telephone Encounter (Signed)
Called pt and he stated that the highest blood sugar he has gotten is 188. Pt was advised to continue monitoring blood sugar and it goes up more and he gets concerned, to call back. Pt verbalized understanding.

## 2017-06-29 NOTE — Telephone Encounter (Signed)
Patient is requesting a call back about an injection he had done in his back today., He stated that he can not get hi sugars to come down and has a headache   Sugars are running about 183 per patient.   Please advise

## 2017-07-21 ENCOUNTER — Other Ambulatory Visit: Payer: Self-pay

## 2017-07-21 MED ORDER — ROSUVASTATIN CALCIUM 20 MG PO TABS: 20.0000 mg | ORAL_TABLET | Freq: Every day | ORAL | 5 refills | Status: DC

## 2017-07-21 MED ORDER — EZETIMIBE 10 MG PO TABS: 10.0000 mg | ORAL_TABLET | Freq: Every day | ORAL | 2 refills | Status: DC

## 2017-08-22 ENCOUNTER — Encounter: Payer: PRIVATE HEALTH INSURANCE | Admitting: Internal Medicine

## 2017-08-26 ENCOUNTER — Other Ambulatory Visit: Payer: Self-pay | Admitting: Endocrinology

## 2017-09-06 ENCOUNTER — Other Ambulatory Visit: Payer: Self-pay | Admitting: Endocrinology

## 2017-10-16 ENCOUNTER — Other Ambulatory Visit (INDEPENDENT_AMBULATORY_CARE_PROVIDER_SITE_OTHER): Payer: PRIVATE HEALTH INSURANCE

## 2017-10-16 DIAGNOSIS — E119 Type 2 diabetes mellitus without complications: Secondary | ICD-10-CM | POA: Diagnosis not present

## 2017-10-16 LAB — COMPREHENSIVE METABOLIC PANEL
ALBUMIN: 3.6 g/dL (ref 3.5–5.2)
ALT: 60 U/L — AB (ref 0–53)
AST: 34 U/L (ref 0–37)
Alkaline Phosphatase: 43 U/L (ref 39–117)
BILIRUBIN TOTAL: 0.7 mg/dL (ref 0.2–1.2)
BUN: 14 mg/dL (ref 6–23)
CO2: 31 mEq/L (ref 19–32)
CREATININE: 1.19 mg/dL (ref 0.40–1.50)
Calcium: 8.9 mg/dL (ref 8.4–10.5)
Chloride: 106 mEq/L (ref 96–112)
GFR: 79.35 mL/min (ref >60.00)
GLUCOSE: 114 mg/dL — AB (ref 70–99)
Potassium: 3.8 mEq/L (ref 3.5–5.1)
SODIUM: 141 meq/L (ref 135–145)
TOTAL PROTEIN: 5.8 g/dL — AB (ref 6.0–8.3)

## 2017-10-16 LAB — LIPID PANEL
CHOLESTEROL: 146 mg/dL (ref 0–200)
HDL: 79.3 mg/dL (ref >39.00)
LDL Cholesterol: 59 mg/dL (ref 0–99)
NONHDL: 67.15
Total CHOL/HDL Ratio: 2
Triglycerides: 41 mg/dL (ref 0.0–149.0)
VLDL: 8.2 mg/dL (ref 0.0–40.0)

## 2017-10-16 LAB — HEMOGLOBIN A1C: HEMOGLOBIN A1C: 6 % (ref 4.6–6.5)

## 2017-10-19 ENCOUNTER — Encounter: Payer: Self-pay | Admitting: Internal Medicine

## 2017-10-19 ENCOUNTER — Ambulatory Visit (AMBULATORY_SURGERY_CENTER): Payer: Self-pay | Admitting: *Deleted

## 2017-10-19 VITALS — Ht 74.0 in | Wt 199.0 lb

## 2017-10-19 DIAGNOSIS — Z860100 Personal history of colon polyps, unspecified: Secondary | ICD-10-CM

## 2017-10-19 DIAGNOSIS — K625 Hemorrhage of anus and rectum: Secondary | ICD-10-CM

## 2017-10-19 NOTE — Progress Notes (Signed)
Patient denies any allergies to eggs or soy. Patient denies any problems with anesthesia/sedation. Patient denies any oxygen use at home. Patient denies taking any diet/weight loss medications or blood thinners. EMMI education assisgned to patient on colonoscopy, this was explained and instructions given to patient. 

## 2017-10-20 ENCOUNTER — Ambulatory Visit: Payer: PRIVATE HEALTH INSURANCE | Admitting: Endocrinology

## 2017-10-27 ENCOUNTER — Encounter: Payer: Self-pay | Admitting: Endocrinology

## 2017-10-27 ENCOUNTER — Ambulatory Visit (INDEPENDENT_AMBULATORY_CARE_PROVIDER_SITE_OTHER): Payer: PRIVATE HEALTH INSURANCE | Admitting: Endocrinology

## 2017-10-27 VITALS — BP 118/70 | HR 70 | Ht 76.0 in | Wt 197.0 lb

## 2017-10-27 DIAGNOSIS — E119 Type 2 diabetes mellitus without complications: Secondary | ICD-10-CM

## 2017-10-27 NOTE — Progress Notes (Signed)
Patient ID: Cameron Cardenas, male   DOB: 08-22-1955, 62 y.o.   MRN: [id number]   Reason for Appointment: Endocrinology follow-up   History of Present Illness   Diagnosis: Type 2 DIABETES MELITUS, date of diagnosis: 2004  PAST history: He had mild diabetes at onset and was treated with metformin and subsequently Actoplusmet In 2013 he had changed his diet significantly and started exercising. This helped him with weight loss Subsequently his blood sugars have been excellent with A1c upper normal  RECENT history:   Oral hypoglycemic drugs: Actoplusmet 15/500 twice a day   He is returning for his 6 month follow-up   His A1c is stable at 6%  Current management, problems and blood sugar patterns:  He did not bring his monitor for download again not clear how often he is monitoring  He has lost a little weight since his visit in April although weight is fluctuating  Has limited ability to exercise because of back and leg pain  He thinks he is trying to watch his portions and high-fat meals most of the time  Taking his Actoplusmet is consistently twice a day and no GI side effects             Side effects from medications: None  Monitors blood glucose infrequently: One Touch  Glucose by recall: Home range 110-137 at different times 180+ with steroids  Physical activity: exercise: Some walking        Dietician visit: Most recent: 12/13               Wt Readings from Last 3 Encounters:  10/27/17 197 lb (89.4 kg)  10/19/17 199 lb (90.3 kg)  06/07/17 192 lb 6 oz (87.3 kg)   Lab Results  Component Value Date   HGBA1C 6.0 10/16/2017   HGBA1C 6.1 04/14/2017   HGBA1C 6.2 10/11/2016   Lab Results  Component Value Date   MICROALBUR 0.7 07/12/2016   LDLCALC 59 10/16/2017   CREATININE 1.19 10/16/2017    No visits with results within 1 Week(s) from this visit.  Latest known visit with results is:  Lab on 10/16/2017  Component Date Value Ref Range Status  .  Cholesterol 10/16/2017 146  0 - 200 mg/dL Final   ATP III Classification       Desirable:  < 200 mg/dL               Borderline High:  200 - 239 mg/dL          High:  > = 240 mg/dL  . Triglycerides 10/16/2017 41.0  0.0 - 149.0 mg/dL Final   Normal:  <150 mg/dLBorderline High:  150 - 199 mg/dL  . HDL 10/16/2017 79.30  >39.00 mg/dL Final  . VLDL 10/16/2017 8.2  0.0 - 40.0 mg/dL Final  . LDL Cholesterol 10/16/2017 59  0 - 99 mg/dL Final  . Total CHOL/HDL Ratio 10/16/2017 2   Final                  Men          Women1/2 Average Risk     3.4          3.3Average Risk          5.0          4.42X Average Risk          9.6          7.13X Average Risk  15.0          11.0                      . NonHDL 10/16/2017 67.15   Final   NOTE:  Non-HDL goal should be 30 mg/dL higher than patient's LDL goal (i.e. LDL goal of < 70 mg/dL, would have non-HDL goal of < 100 mg/dL)  . Sodium 10/16/2017 141  135 - 145 mEq/L Final  . Potassium 10/16/2017 3.8  3.5 - 5.1 mEq/L Final  . Chloride 10/16/2017 106  96 - 112 mEq/L Final  . CO2 10/16/2017 31  19 - 32 mEq/L Final  . Glucose, Bld 10/16/2017 114* 70 - 99 mg/dL Final  . BUN 10/16/2017 14  6 - 23 mg/dL Final  . Creatinine, Ser 10/16/2017 1.19  0.40 - 1.50 mg/dL Final  . Total Bilirubin 10/16/2017 0.7  0.2 - 1.2 mg/dL Final  . Alkaline Phosphatase 10/16/2017 43  39 - 117 U/L Final  . AST 10/16/2017 34  0 - 37 U/L Final  . ALT 10/16/2017 60* 0 - 53 U/L Final  . Total Protein 10/16/2017 5.8* 6.0 - 8.3 g/dL Final  . Albumin 10/16/2017 3.6  3.5 - 5.2 g/dL Final  . Calcium 10/16/2017 8.9  8.4 - 10.5 mg/dL Final  . GFR 10/16/2017 79.35  >60.00 mL/min Final  . Hgb A1c MFr Bld 10/16/2017 6.0  4.6 - 6.5 % Final   Glycemic Control Guidelines for People with Diabetes:Non Diabetic:  <6%Goal of Therapy: <7%Additional Action Suggested:  >8%      Allergies as of 10/27/2017      Reactions   Percocet [oxycodone-acetaminophen] Other (See Comments)   Caused  hallucinations      Medication List        Accurate as of 10/27/17  8:59 AM. Always use your most recent med list.          aspirin 81 MG tablet Take 81 mg by mouth daily.   ezetimibe 10 MG tablet Commonly known as:  ZETIA Take 1 tablet (10 mg total) by mouth daily.   ibuprofen 600 MG tablet Commonly known as:  ADVIL,MOTRIN TAKE 1 TABLET BY MOUTH THREE TIMES DAILY FOR 30 DAYS   lisinopril-hydrochlorothiazide 10-12.5 MG tablet Commonly known as:  PRINZIDE,ZESTORETIC TAKE 1 TABLET BY MOUTH ONCE DAILY   pioglitazone-metformin 15-500 MG tablet Commonly known as:  ACTOPLUS MET TAKE 1 TABLET BY MOUTH TWICE DAILY   PROCTOZONE-HC 2.5 % rectal cream Generic drug:  hydrocortisone APPLY CREAM RECTALLY TO AFFECTED AREA TWICE DAILY   rosuvastatin 20 MG tablet Commonly known as:  CRESTOR Take 1 tablet (20 mg total) by mouth daily.   sildenafil 20 MG tablet Commonly known as:  REVATIO '20mg'$ , 2-3 prn as directed       Allergies:  Allergies  Allergen Reactions  . Percocet [Oxycodone-Acetaminophen] Other (See Comments)    Caused hallucinations    Past Medical History:  Diagnosis Date  . Allergic rhinitis due to pollen   . DDD (degenerative disc disease), lumbar   . Essential hypertension, malignant   . Hemorrhoids   . Lumbago   . Plantar fasciitis of left foot   . Pure hypercholesterolemia   . Type II or unspecified type diabetes mellitus without mention of complication, not stated as uncontrolled    dx in 2005  . Unspecified vitamin D deficiency     Past Surgical History:  Procedure Laterality Date  . COLONOSCOPY     in [place],  MD no longer in practice, does not recall the name of facility. Does belive polyps were removed  . LUMBAR DISC SURGERY  2011   total of 3 sx on back per pt  . MAXIMUM ACCESS (MAS)POSTERIOR LUMBAR INTERBODY FUSION (PLIF) 1 LEVEL N/A 06/10/2014   Procedure: Redo Lumbar Five-Sacral One Decompression with maximum access posterior lumbar interbody  fusion;  Surgeon: Erline Levine, MD;  Location: [facility];  Service: Neurosurgery;  Laterality: N/A;  Redo Decompression with maximum access posterior lumbar interbody fusion, L5-S1    Family History  Problem Relation Age of Onset  . Hypertension Mother   . Hypertension Father   . Diabetes Sister   . Seizures Brother        caused his death  . Colon cancer Neg Hx   . Esophageal cancer Neg Hx   . Rectal cancer Neg Hx   . Stomach cancer Neg Hx   . Heart disease Neg Hx   . Colon polyps Neg Hx     Social History:  reports that he quit smoking about 25 years ago. He has never used smokeless tobacco. He reports that he drinks about 3.0 standard drinks of alcohol per week. He reports that he has current or past drug history. Drug: Other-see comments.   No visits with results within 1 Week(s) from this visit.  Latest known visit with results is:  Lab on 10/16/2017  Component Date Value Ref Range Status  . Cholesterol 10/16/2017 146  0 - 200 mg/dL Final   ATP III Classification       Desirable:  < 200 mg/dL               Borderline High:  200 - 239 mg/dL          High:  > = 240 mg/dL  . Triglycerides 10/16/2017 41.0  0.0 - 149.0 mg/dL Final   Normal:  <150 mg/dLBorderline High:  150 - 199 mg/dL  . HDL 10/16/2017 79.30  >39.00 mg/dL Final  . VLDL 10/16/2017 8.2  0.0 - 40.0 mg/dL Final  . LDL Cholesterol 10/16/2017 59  0 - 99 mg/dL Final  . Total CHOL/HDL Ratio 10/16/2017 2   Final                  Men          Women1/2 Average Risk     3.4          3.3Average Risk          5.0          4.42X Average Risk          9.6          7.13X Average Risk          15.0          11.0                      . NonHDL 10/16/2017 67.15   Final   NOTE:  Non-HDL goal should be 30 mg/dL higher than patient's LDL goal (i.e. LDL goal of < 70 mg/dL, would have non-HDL goal of < 100 mg/dL)  . Sodium 10/16/2017 141  135 - 145 mEq/L Final  . Potassium 10/16/2017 3.8  3.5 - 5.1 mEq/L Final  . Chloride 10/16/2017  106  96 - 112 mEq/L Final  . CO2 10/16/2017 31  19 - 32 mEq/L Final  . Glucose, Bld 10/16/2017 114* 70 - 99 mg/dL  Final  . BUN 10/16/2017 14  6 - 23 mg/dL Final  . Creatinine, Ser 10/16/2017 1.19  0.40 - 1.50 mg/dL Final  . Total Bilirubin 10/16/2017 0.7  0.2 - 1.2 mg/dL Final  . Alkaline Phosphatase 10/16/2017 43  39 - 117 U/L Final  . AST 10/16/2017 34  0 - 37 U/L Final  . ALT 10/16/2017 60* 0 - 53 U/L Final  . Total Protein 10/16/2017 5.8* 6.0 - 8.3 g/dL Final  . Albumin 10/16/2017 3.6  3.5 - 5.2 g/dL Final  . Calcium 10/16/2017 8.9  8.4 - 10.5 mg/dL Final  . GFR 10/16/2017 79.35  >60.00 mL/min Final  . Hgb A1c MFr Bld 10/16/2017 6.0  4.6 - 6.5 % Final   Glycemic Control Guidelines for People with Diabetes:Non Diabetic:  <6%Goal of Therapy: <7%Additional Action Suggested:  >8%     Review of Systems:  HYPERTENSION:   Treated with Zestoretic 10/12.5  HYPERLIPIDEMIA: The lipid abnormality consists of elevated LDL  He had been on 40 mg atorvastatin since 12/13 and this was increased to 80 mg in 6/16 Previously did have LDL particle number of 1348 previously and which was relatively high at 1216 in 6/16  With Crestor 20 mg and Zetia daily his LDL is excellent He thinks he is taking this regimen regularly     Lab Results  Component Value Date   CHOL 146 10/16/2017   HDL 79.30 10/16/2017   LDLCALC 59 10/16/2017   TRIG 41.0 10/16/2017   CHOLHDL 2 10/16/2017    He is asking about pains in his lower legs, mostly on walking and some weakness which has been present for some time Also has had significant back pain and may have had steroids again for his lumbar disc problems    He had a foot exam in 10/19    Examination:   BP 118/70   Pulse 70   Ht '6\' 4"'$  (1.93 m)   Wt 197 lb (89.4 kg)   SpO2 97%   BMI 23.98 kg/m   Body mass index is 23.98 kg/m.   Diabetic Foot Exam - Simple   Simple Foot Form Diabetic Foot exam was performed with the following findings:  Yes 10/27/2017   8:41 AM  Visual Inspection No deformities, no ulcerations, no other skin breakdown bilaterally:  Yes Sensation Testing Intact to touch and monofilament testing bilaterally:  Yes Pulse Check Posterior Tibialis and Dorsalis pulse intact bilaterally:  Yes Comments      ASSESSMENT/ PLAN:   Diabetes type 2 with BMI 24  He has had mild diabetes with consistently good control of Actoplusmet 15/500 twice a day  His A1c is in the normal range for the lab Although not clear what his blood sugars are at home he is reporting good readings Also has been able to keep his weight relatively controlled although may be slightly underweight recently For now he will continue Actoplusmet twice a day which is working safely He is going to try and exercising much as tolerated Recommended more monitoring after meals  HYPERLIPIDEMIA with increased LDL particle number in the past LDL is controlled with Crestor and Zetia   Leg weakness: Discussed that this is likely to be from spinal stenosis as he has no evidence of diabetic neuropathy  Hypertension: ls controlled with Zestoretic, continue follow-up with PCP  Follow-up in 6 months  There are no Patient Instructions on file for this visit.  Elayne Snare 10/27/2017, 8:59 AM     Note: This office note  was prepared with Dragon voice recognition system technology. Any transcriptional errors that result from this process are unintentional.  

## 2017-11-01 ENCOUNTER — Ambulatory Visit (AMBULATORY_SURGERY_CENTER): Payer: PRIVATE HEALTH INSURANCE | Admitting: Internal Medicine

## 2017-11-01 ENCOUNTER — Encounter: Payer: Self-pay | Admitting: Internal Medicine

## 2017-11-01 VITALS — BP 108/67 | HR 71 | Temp 97.8°F | Resp 20 | Ht 74.0 in | Wt 199.0 lb

## 2017-11-01 DIAGNOSIS — K51411 Inflammatory polyps of colon with rectal bleeding: Secondary | ICD-10-CM | POA: Diagnosis not present

## 2017-11-01 DIAGNOSIS — K5730 Diverticulosis of large intestine without perforation or abscess without bleeding: Secondary | ICD-10-CM

## 2017-11-01 DIAGNOSIS — K648 Other hemorrhoids: Secondary | ICD-10-CM | POA: Diagnosis not present

## 2017-11-01 DIAGNOSIS — D122 Benign neoplasm of ascending colon: Secondary | ICD-10-CM

## 2017-11-01 DIAGNOSIS — K5140 Inflammatory polyps of colon without complications: Secondary | ICD-10-CM

## 2017-11-01 DIAGNOSIS — K625 Hemorrhage of anus and rectum: Secondary | ICD-10-CM

## 2017-11-01 DIAGNOSIS — K641 Second degree hemorrhoids: Secondary | ICD-10-CM

## 2017-11-01 MED ORDER — SODIUM CHLORIDE 0.9 % IV SOLN: 500.0000 mL | Freq: Once | INTRAVENOUS | Status: DC

## 2017-11-01 NOTE — Progress Notes (Signed)
PT taken to PACU. Monitors in place. VSS. Report given to RN. 

## 2017-11-01 NOTE — Patient Instructions (Addendum)
I found and removed 2 tiny polyps. I will let you know pathology results and when to have another routine colonoscopy by mail and/or My Chart.  You do have swollen hemorrhoids.  We will make an appointment to evaluate and treat with banding in the office.  I appreciate the opportunity to care for you. blank  Handout given on polyps and hemorrhoids.  YOU HAD AN ENDOSCOPIC PROCEDURE TODAY AT [facility]:   Refer to the procedure report that was given to you for any specific questions about what was found during the examination.  If the procedure report does not answer your questions, please call your gastroenterologist to clarify.  If you requested that your care partner not be given the details of your procedure findings, then the procedure report has been included in a sealed envelope for you to review at your convenience later.  YOU SHOULD EXPECT: Some feelings of bloating in the abdomen. Passage of more gas than usual.  Walking can help get rid of the air that was put into your GI tract during the procedure and reduce the bloating. If you had a lower endoscopy (such as a colonoscopy or flexible sigmoidoscopy) you may notice spotting of blood in your stool or on the toilet paper. If you underwent a bowel prep for your procedure, you may not have a normal bowel movement for a few days.  Please Note:  You might notice some irritation and congestion in your nose or some drainage.  This is from the oxygen used during your procedure.  There is no need for concern and it should clear up in a day or so.  SYMPTOMS TO REPORT IMMEDIATELY:   Following lower endoscopy (colonoscopy or flexible sigmoidoscopy):  Excessive amounts of blood in the stool  Significant tenderness or worsening of abdominal pains  Swelling of the abdomen that is new, acute  Fever of 100F or higher   For urgent or emergent issues, a gastroenterologist can be reached at any hour by calling [phone]  [phone].   DIET:  We do recommend a small meal at first, but then you may proceed to your regular diet.  Drink plenty of fluids but you should avoid alcoholic beverages for 24 hours.  ACTIVITY:  You should plan to take it easy for the rest of today and you should NOT DRIVE or use heavy machinery until tomorrow (because of the sedation medicines used during the test).    FOLLOW UP: Our staff will call the number listed on your records the next business day following your procedure to check on you and address any questions or concerns that you may have regarding the information given to you following your procedure. If we do not reach you, we will leave a message.  However, if you are feeling well and you are not experiencing any problems, there is no need to return our call.  We will assume that you have returned to your regular daily activities without incident.  If any biopsies were taken you will be contacted by phone or by letter within the next 1-3 weeks.  Please call us at [phone] if you have not heard about the biopsies in 3 weeks.    SIGNATURES/CONFIDENTIALITY: You and/or your care partner have signed paperwork which will be entered into your electronic medical record.  These signatures attest to the fact that that the information above on your After Visit Summary has been reviewed and is understood.  Full responsibility of the confidentiality  of this discharge information lies with you and/or your care-partner.

## 2017-11-01 NOTE — Progress Notes (Signed)
Pt's states no medical or surgical changes since previsit or office visit. 

## 2017-11-01 NOTE — Progress Notes (Signed)
Called to room to assist during endoscopic procedure.  Patient ID and intended procedure confirmed with present staff. Received instructions for my participation in the procedure from the performing physician.  

## 2017-11-01 NOTE — Op Note (Signed)
[facility] Patient Name: Cameron Cardenas Procedure Date: 11/01/2017 3:00 PM MRN: [id number] Endoscopist: Gatha Mayer , MD Age: 62 Referring MD:  Date of Birth: 05-20-1955 Gender: Male Account #: [id number] Procedure:                Colonoscopy Indications:              Rectal bleeding, Personal history of colonic polyps Medicines:                Propofol per Anesthesia, Monitored Anesthesia Care Procedure:                Pre-Anesthesia Assessment:                           - Prior to the procedure, a History and Physical                            was performed, and patient medications and                            allergies were reviewed. The patient's tolerance of                            previous anesthesia was also reviewed. The risks                            and benefits of the procedure and the sedation                            options and risks were discussed with the patient.                            All questions were answered, and informed consent                            was obtained. Prior Anticoagulants: The patient has                            taken no previous anticoagulant or antiplatelet                            agents. ASA Grade Assessment: II - A patient with                            mild systemic disease. After reviewing the risks                            and benefits, the patient was deemed in                            satisfactory condition to undergo the procedure.                           After obtaining informed consent, the colonoscope  was passed under direct vision. Throughout the                            procedure, the patient's blood pressure, pulse, and                            oxygen saturations were monitored continuously. The                            Colonoscope was introduced through the anus and                            advanced to the the cecum, identified by        appendiceal orifice and ileocecal valve. The                            colonoscopy was performed without difficulty. The                            patient tolerated the procedure well. The quality                            of the bowel preparation was good. The ileocecal                            valve, appendiceal orifice, and rectum were                            photographed. The bowel preparation used was                            Miralax. Scope In: 3:15:00 PM Scope Out: 3:31:47 PM Scope Withdrawal Time: 0 hours 14 minutes 30 seconds  Total Procedure Duration: 0 hours 16 minutes 47 seconds  Findings:                 The perianal and digital rectal examinations were                            normal. Pertinent negatives include normal prostate                            (size, shape, and consistency).                           A 3 mm polyp was found in the ascending colon. The                            polyp was sessile. The polyp was removed with a                            cold snare. Resection and retrieval were complete.  Verification of patient identification for the                            specimen was done. Estimated blood loss was minimal.                           A 1 mm polyp was found in the ascending colon. The                            polyp was sessile. The polyp was removed with a                            cold biopsy forceps. Resection and retrieval were                            complete. Verification of patient identification                            for the specimen was done. Estimated blood loss was                            minimal.                           A few diverticula were found in the sigmoid colon.                           Internal hemorrhoids were found during                            retroflexion. The hemorrhoids were Grade II                            (internal hemorrhoids that prolapse but reduce                             spontaneously).                           A small scar was found in the distal rectum. From                            banding.                           The exam was otherwise without abnormality on                            direct and retroflexion views. Complications:            No immediate complications. Estimated Blood Loss:     Estimated blood loss was minimal. Impression:               - One 3 mm polyp in the ascending colon, removed  with a cold snare. Resected and retrieved.                           - One 1 mm polyp in the ascending colon, removed                            with a cold biopsy forceps. Resected and retrieved.                           - Diverticulosis in the sigmoid colon.                           - Internal hemorrhoids.                           - Scar in the distal rectum.                           - The examination was otherwise normal on direct                            and retroflexion views.                           - Personal history of colonic polyps. Recommendation:           - Patient has a contact number available for                            emergencies. The signs and symptoms of potential                            delayed complications were discussed with the                            patient. Return to normal activities tomorrow.                            Written discharge instructions were provided to the                            patient.                           - Resume previous diet.                           - Continue present medications.                           - Repeat colonoscopy is recommended for                            surveillance. The colonoscopy date will be  determined after pathology results from today's                            exam become available for review. Gatha Mayer, MD 11/01/2017 3:39:57 PM This report has been signed  electronically.

## 2017-11-02 ENCOUNTER — Telehealth: Payer: Self-pay

## 2017-11-02 ENCOUNTER — Telehealth: Payer: Self-pay | Admitting: *Deleted

## 2017-11-02 NOTE — Telephone Encounter (Signed)
  Follow up Call-  Call back number 11/01/2017  Post procedure Call Back phone  # [phone]  Permission to leave phone message Yes  Some recent data might be hidden     Patient questions:  Do you have a fever, pain , or abdominal swelling? No. Pain Score  0 *  Have you tolerated food without any problems? Yes.    Have you been able to return to your normal activities? Yes.    Do you have any questions about your discharge instructions: Diet   No. Medications  No. Follow up visit  No.  Do you have questions or concerns about your Care? No.  Actions: * If pain score is 4 or above: No action needed, pain <4.

## 2017-11-02 NOTE — Telephone Encounter (Signed)
No answer, message left for the patient. 

## 2017-11-09 ENCOUNTER — Other Ambulatory Visit: Payer: Self-pay | Admitting: Endocrinology

## 2017-11-12 ENCOUNTER — Encounter: Payer: Self-pay | Admitting: Internal Medicine

## 2017-11-12 NOTE — Progress Notes (Signed)
Inflammatory polyps Recall 2029

## 2017-11-28 LAB — HM DIABETES EYE EXAM

## 2017-12-05 ENCOUNTER — Other Ambulatory Visit: Payer: Self-pay | Admitting: Endocrinology

## 2017-12-25 ENCOUNTER — Other Ambulatory Visit: Payer: Self-pay | Admitting: Neurosurgery

## 2017-12-25 DIAGNOSIS — M48062 Spinal stenosis, lumbar region with neurogenic claudication: Secondary | ICD-10-CM

## 2018-01-14 ENCOUNTER — Ambulatory Visit
Admission: RE | Admit: 2018-01-14 | Discharge: 2018-01-14 | Disposition: A | Discharge status: Home / Self Care | Payer: PRIVATE HEALTH INSURANCE | Source: Ambulatory Visit | Attending: Neurosurgery | Admitting: Neurosurgery

## 2018-01-14 DIAGNOSIS — M48062 Spinal stenosis, lumbar region with neurogenic claudication: Secondary | ICD-10-CM

## 2018-01-14 MED ORDER — GADOBENATE DIMEGLUMINE 529 MG/ML IV SOLN
18.0000 mL | Freq: Once | INTRAVENOUS | Status: AC | PRN
  Administered 2018-01-14: 18 mL via INTRAVENOUS

## 2018-01-17 ENCOUNTER — Telehealth: Payer: Self-pay | Admitting: Endocrinology

## 2018-01-17 NOTE — Telephone Encounter (Signed)
Patient is calling in regards to MRI results they received. Please Advise, thanks

## 2018-01-18 NOTE — Telephone Encounter (Signed)
Please advise 

## 2018-01-18 NOTE — Telephone Encounter (Signed)
He needs to talk to Dr. Vertell Limber who ordered the MRI

## 2018-01-24 ENCOUNTER — Telehealth: Payer: Self-pay | Admitting: Endocrinology

## 2018-01-24 NOTE — Telephone Encounter (Signed)
Report of nerve conduction study reviewed.  Study was somewhat incomplete and unclear whether it is diabetes related problem.  Since the opinion can be best given by a neurologist and is to go through his PCP for referral

## 2018-01-24 NOTE — Telephone Encounter (Signed)
Pt called and notified

## 2018-01-27 ENCOUNTER — Ambulatory Visit
Admission: EM | Admit: 2018-01-27 | Discharge: 2018-01-27 | Disposition: A | Discharge status: Home / Self Care | Payer: BLUE CROSS/BLUE SHIELD

## 2018-01-27 NOTE — ED Notes (Signed)
Spoke to patient about his reason for his visit.  States he is needing a referral to a neurologist.  [facility] sent him here to the PCP side for a referral and to get established.  This RN explained that we are not directly connected, and that we are unable to do direct referrals here.  Patient verbalized understanding, and will make an appointment with PCP side on MOnday.   Denies any changes in his condition since his previous visit, refused wanting to speak to the APP prior to leaving.

## 2018-01-29 ENCOUNTER — Encounter: Payer: Self-pay | Admitting: Family Medicine

## 2018-01-30 ENCOUNTER — Encounter: Payer: Self-pay | Admitting: Family Medicine

## 2018-01-30 ENCOUNTER — Ambulatory Visit (INDEPENDENT_AMBULATORY_CARE_PROVIDER_SITE_OTHER): Payer: BLUE CROSS/BLUE SHIELD | Admitting: Family Medicine

## 2018-01-30 VITALS — BP 109/63 | HR 63 | Resp 17 | Ht 76.0 in | Wt 200.2 lb

## 2018-01-30 DIAGNOSIS — R51 Headache: Secondary | ICD-10-CM

## 2018-01-30 DIAGNOSIS — M5442 Lumbago with sciatica, left side: Secondary | ICD-10-CM

## 2018-01-30 DIAGNOSIS — G629 Polyneuropathy, unspecified: Secondary | ICD-10-CM | POA: Diagnosis not present

## 2018-01-30 DIAGNOSIS — G44059 Short lasting unilateral neuralgiform headache with conjunctival injection and tearing (SUNCT), not intractable: Secondary | ICD-10-CM | POA: Diagnosis not present

## 2018-01-30 DIAGNOSIS — G8929 Other chronic pain: Secondary | ICD-10-CM

## 2018-01-30 DIAGNOSIS — M5441 Lumbago with sciatica, right side: Secondary | ICD-10-CM

## 2018-01-30 DIAGNOSIS — R519 Headache, unspecified: Secondary | ICD-10-CM

## 2018-01-30 MED ORDER — GABAPENTIN 100 MG PO CAPS: 300.0000 mg | ORAL_CAPSULE | Freq: Every day | ORAL | 3 refills | Status: DC

## 2018-01-30 MED ORDER — BENZONATATE 100 MG PO CAPS: 100.0000 mg | ORAL_CAPSULE | Freq: Three times a day (TID) | ORAL | 0 refills | Status: DC | PRN

## 2018-01-30 NOTE — Progress Notes (Signed)
Cameron Cardenas, is a 63 y.o. male  ZSM:[id number]  GBE:[id number]  DOB - 06-Jan-1956  CC:  Chief Complaint  Patient presents with  . Establish Care  . Referral    needs a referral to Dr. Narda Amber for neuropathy. had a recent NCS which required further workup to see if pain is caused by DM or something else  . Headache    L frontal headaches x 3 months. has taken Ibuprofen with some relief.       HPI: Cameron Cardenas is a 63 y.o. male is here today to establish care and he has multiple referral request.  Cameron Cardenas has Type II diabetes mellitus with manifestations ([place]); Herniated lumbar intervertebral disc; Hyperlipidemia with target LDL less than 100; Leukopenia; ED (erectile dysfunction) of organic origin; Essential hypertension; Allergic rhinitis due to pollen; Routine general medical examination at a health care facility; Palpitations; and Prolapsed internal hemorrhoids, grade 2 on their problem list.   Referrals: Patient presents requesting referrals. He is followed by neurosurgeon, Hortencia Conradi, MD. he has a history of back surgeries in 1983, 2010 in 2014.  He reports that Dr. Brien Few simply deemed him as disabled and had him taken permanently out of work due to chronic back disease and durations of mobility.  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 Patient was recommended by to follow-up with neurologist for additional evaluation. He doesn't have a primary care provider. He is followed by endocrinology  for management of diabetes, which is well controlled.  Last A1c 6.0.  Complains of constant neuropathic pain which shoots down into his legs radiating down to his feet. Characterizes the pain as sharp and burning.  Pain is not improved with any measures.  He complains of similar pain in his right thumb only.  He is currently not prescribed Neurontin or Lyrica for nerve neuropathy.  He has not taken these medications in the past either to his  knowledge. Secondly he is requesting a referral to pain management.  He is not interested in opioid medications however is interested in injections.  He has had injections in the past and felt this helped him manage his pain and improve his daily activities of living.  He is requesting a referral to [facility].   Frontal headaches, New: Patient presents with a complaint of temporal headaches. This is a new problem although has been recurring over the course of the last 3 months.  Denies any identified precipitating events which incite headaches.  Headaches are localized initially to the left temporal region and he endorses some tearing and blurred vision when these headaches occur.  Headaches can last up to 20 or 30 minutes.  He takes ibuprofen once this occurs with little improvement.  He has to rest for headaches to resolve. Of recent he is noticed a similar pattern of headaches taken place on the right temporal region. He has not had the tearing of the right eye. He has no history of hypertension or stroke.  No prior history of temporal arteritis.  His knowledge no chronic sinusitis or problems with his sinuses.  He is not complaining of any associated congestion when these headaches are occurring.  He denies dizziness although notes that the pain is sharp and severe at times.  Unknown if related, he complains of a non-productive cough when he lies on his left side. Cough only occurs with lying down. No history of GERD or COPD. He not taken any medication for cough.  Patient denies  chest pain, abdominal pain, nausea, new weakness , SOB, or edema.  Current medications: Current Outpatient Medications:  .  aspirin 81 MG tablet, Take 81 mg by mouth daily., Disp: , Rfl:  .  ezetimibe (ZETIA) 10 MG tablet, Take 1 tablet (10 mg total) by mouth daily., Disp: 90 tablet, Rfl: 2 .  ibuprofen (ADVIL,MOTRIN) 600 MG tablet, TAKE 1 TABLET BY MOUTH THREE TIMES DAILY FOR 30 DAYS, Disp: , Rfl: 2 .   lisinopril-hydrochlorothiazide (PRINZIDE,ZESTORETIC) 10-12.5 MG tablet, TAKE 1 TABLET BY MOUTH ONCE DAILY, Disp: 30 tablet, Rfl: 2 .  pioglitazone-metformin (ACTOPLUS MET) 15-500 MG tablet, TAKE 1 TABLET BY MOUTH TWICE DAILY, Disp: 60 tablet, Rfl: 2 .  rosuvastatin (CRESTOR) 20 MG tablet, TAKE 1 TABLET BY MOUTH ONCE DAILY, Disp: 30 tablet, Rfl: 5   Pertinent family medical history: family history includes Cataracts in his sister; Diabetes in his sister; Hypertension in his father and mother; Seizures in his brother.   Allergies  Allergen Reactions  . Percocet [Oxycodone-Acetaminophen] Other (See Comments)    Caused hallucinations    Social History   Socioeconomic History  . Marital status: Married    Spouse name: Not on file  . Number of children: Not on file  . Years of education: Not on file  . Highest education level: Not on file  Occupational History  . Occupation: Scientist, clinical (histocompatibility and immunogenetics): [employer]  . Financial resource strain: Not on file  . Food insecurity:    Worry: Not on file    Inability: Not on file  . Transportation needs:    Medical: Not on file    Non-medical: Not on file  Tobacco Use  . Smoking status: Former Smoker    Last attempt to quit: 08/28/1992    Years since quitting: 25.4  . Smokeless tobacco: Never Used  Substance and Sexual Activity  . Alcohol use: Yes    Alcohol/week: 3.0 standard drinks    Types: 3 Cans of beer per week    Comment: occassional  . Drug use: Yes    Types: Other-see comments    Comment: HEMP smoking-daily   . Sexual activity: Not on file  Lifestyle  . Physical activity:    Days per week: Not on file    Minutes per session: Not on file  . Stress: Not on file  Relationships  . Social connections:    Talks on phone: Not on file    Gets together: Not on file    Attends religious service: Not on file    Active member of club or organization: Not on file    Attends meetings of clubs or organizations: Not on file     Relationship status: Not on file  . Intimate partner violence:    Fear of current or ex partner: Not on file    Emotionally abused: Not on file    Physically abused: Not on file    Forced sexual activity: Not on file  Other Topics Concern  . Not on file  Social History Narrative   Employed in automobile sales   Married   Occasional alcohol and marijuana no tobacco    Review of Systems: Pertinent negatives listed in HPI Objective:   Vitals:   01/30/18 1623  BP: 109/63  Pulse: 63  Resp: 17  SpO2: 96%    BP Readings from Last 3 Encounters:  01/30/18 109/63  11/01/17 108/67  10/27/17 118/70    Filed Weights   01/30/18 1623  Weight: 200 lb 3.2 oz (90.8 kg)     Physical Exam: Constitutional: Patient appears well-developed and well-nourished. No distress. HENT: Normocephalic, atraumatic. Tenderness with palpation of temporal region of head. no pulsations noted of temporal pulse.External right and left ear normal. Oropharynx is clear and moist.  Eyes: Conjunctivae and EOM are normal. PERRLA, no scleral icterus. Neck: Normal ROM. Neck supple. No JVD. No tracheal deviation. No thyromegaly. CVS: RRR, S1/S2 +, no murmurs, no gallops, no carotid bruit.  Pulmonary: Effort and breath sounds normal, no stridor, rhonchi, wheezes, rales.  Abdominal: Soft. BS +, no distension, tenderness, rebound or guarding.  Musculoskeletal: Normal range of motion. No edema and no tenderness.  Neuro: Alert. Normal muscle tone coordination. Antalgic Gait .  Skin: Skin is warm and dry. No rash noted. Not diaphoretic. No erythema. No pallor. Psychiatric: Normal mood and affect. Behavior, judgment, thought content normal.  Lab Results (prior encounters)  Lab Results  Component Value Date   WBC 2.6 (L) 02/25/2015   HGB 14.4 02/25/2015   HCT 42.8 02/25/2015   MCV 85.9 02/25/2015   PLT 123 Large platelets present (L) 02/25/2015   Lab Results  Component Value Date   CREATININE 1.19 10/16/2017    BUN 14 10/16/2017   NA 141 10/16/2017   K 3.8 10/16/2017   CL 106 10/16/2017   CO2 31 10/16/2017    Lab Results  Component Value Date   HGBA1C 6.0 10/16/2017       Component Value Date/Time   CHOL 146 10/16/2017 0815   TRIG 41.0 10/16/2017 0815   HDL 79.30 10/16/2017 0815   CHOLHDL 2 10/16/2017 0815   VLDL 8.2 10/16/2017 0815   LDLCALC 59 10/16/2017 0815       Assessment and plan:  1. Polyneuropathy - Ambulatory referral to Neurology  2. Chronic bilateral low back pain with bilateral sciatica - Ambulatory referral to Pain Clinic  3. Temporal pain, obtaining a sed rate and C-reactive protein to rule out temporal arteritis.  If unremarkable will obtain a CT study - Sedimentation Rate - CBC with Differential - C-reactive protein  4. Short lasting unilateral neuralgiform headache with conjunctival injection and tearing (SUNCT), not intractable, sed rate and C-reactive protein were both well within normal range. Will follow-up with a CT of the head without contrast.  Patient has been referred to neurology for polyneuropathy will also add headaches to the referral.  In the meantime will review CT to rule out anything that warrants immediate follow-up and evaluation. - CT Head Wo Contrast; Future    Orders Placed This Encounter  Procedures  . CT Head Wo Contrast  . Sedimentation Rate  . CBC with Differential  . C-reactive protein  . Ambulatory referral to Pain Clinic  . Ambulatory referral to Neurology    A total of 40 minutes spent, greater than 50 % of this time was spent counseling, reviewing diagnostic studies performed by neurosurgeon, reviewing prior EMR records/lab,  and coordination of care.    Return in about 3 months (around 05/01/2018) for Chronic condition follow-up sooner if needed .   The patient was given clear instructions to go to ER or return to medical center if symptoms don't improve, worsen or new problems develop. The patient verbalized  understanding. The patient was advised  to call and obtain lab results if they haven't heard anything from out office within 7-10 business days.  Molli Barrows, FNP Primary Care at [facility] [address], [place] [phone]: [phone]  This note has been created with Surveyor, quantity. Any transcriptional errors are unintentional.

## 2018-01-30 NOTE — Patient Instructions (Addendum)
You will be notified of any abnormal lab results via a phone all from our office.  Your follow-up visit will be based on lab results.  I suspect your headaches are related to Temporal Arteritis. Labs will confirm diagnosis.  For neuropathic pain, I have prescribed Gabapentin. I wrote the prescription for 300 mg at bedtime, however, you may start dose 100 mg at bedtime and increase to 300 mg if needed.     Temporal Arteritis  Temporal arteritis is a condition that causes arteries to become inflamed. It usually affects arteries in your head and face, but arteries in any part of the body can become inflamed. The condition is also called giant cell arteritis.  Temporal arteritis can cause serious problems such as blindness. Early treatment can help prevent these problems. What are the causes? The cause of this condition is not known. What increases the risk? The following factors may make you more likely to develop this condition:  Being older than 50.  Being a woman.  Being Caucasian.  Being of [place], [place], [place], [place] or [place] ancestry.  Having a family history of the condition.  Having a certain condition that causes muscle pain and stiffness (polymyalgia rheumatica, PMR). What are the signs or symptoms? Some people with temporal arteritis have just one symptom, while others have several symptoms. Most symptoms are related to the head and face. These may include:  Headache.  Hard or swollen temples. This is common. Your temples are the areas on either side of your forehead. If your temples are swollen, it may hurt to touch them.  Pain when combing your hair or when laying your head down.  Pain in the jaw when chewing.  Pain in the throat or tongue.  Problems with your vision, such as sudden loss of vision in one eye, or seeing double. Other symptoms may include:  Fever.  Tiredness (fatigue).  A dry cough.  Pain in the hips and shoulders.  Pain in  the arms during exercise.  Depression.  Weight loss. How is this diagnosed? This condition may be diagnosed based on:  Your symptoms.  Your medical history.  A physical exam.  Tests, including: ? Blood tests. ? A test in which a tissue sample is removed from an artery so it can be examined (biopsy). ? Imaging tests, such as an ultrasound or MRI. How is this treated? This condition may be treated with:  A type of medicine to reduce inflammation (corticosteroid).  Medicines to weaken your immune system (immunosuppressants).  Other medicines to treat vision problems. You will need to see your health care provider while you are being treated. The medicines used to treat this condition can increase your risk of problems such as bone loss and diabetes. During follow-up visits, your health care provider will check for problems by:  Doing blood tests and bone density tests.  Checking your blood pressure and blood sugar. Follow these instructions at home: Medicines  Take over-the-counter and prescription medicines only as told by your health care provider.  Take any vitamins or supplements recommended by your health care provider. These may include vitamin D and calcium, which help keep your bones from becoming weak. Eating and drinking   Eat a heart-healthy diet. This may include: ? Eating high-fiber foods, such as fresh fruits and vegetables, whole grains, and beans. ? Eating heart-healthy fats (omega-3 fats), such as fish, flaxseed, and flaxseed oil. ? Limiting foods that are high in saturated fat and cholesterol, such as processed and  fried foods, fatty meat, and full-fat dairy. ? Limiting how much salt (sodium) you eat.  Include calcium and vitamin D in your diet. Good sources of calcium and vitamin D include: ? Low-fat dairy products such as milk, yogurt, and cheese. ? Certain fish, such as fresh or canned salmon, tuna, and sardines. ? Products that have calcium and  vitamin D added to them (fortified products), such as fortified cereals or juice. General instructions  Exercise. Talk with your health care provider about what exercises are okay for you. Exercises that increase your heart rate (aerobic exercise), such as walking, are often recommended. Aerobic exercise helps control your blood pressure and prevent bone loss.  Stay up to date on all vaccines as directed by your health care provider.  Keep all follow-up visits as told by your health care provider. This is important. Contact a health care provider if:  Your symptoms get worse.  You develop signs of infection, such as fever, swelling, redness, warmth, and tenderness. Get help right away if:  You lose your vision.  Your pain does not go away, even after you take medicine.  You have chest pain.  You have trouble breathing.  One side of your face or body suddenly becomes weak or numb. These symptoms may represent a serious problem that is an emergency. Do not wait to see if the symptoms will go away. Get medical help right away. Call your local emergency services (911 in the [place] Do not drive yourself to the hospital. Summary  Temporal arteritis is a condition that causes arteries to become inflamed. It usually affects arteries in your head and face.  This condition can cause serious problems, such as blindness. Treatment can help prevent these problems.  Symptoms may include hard or tender temples, pain in your jaw when chewing, problems with your vision, or pain in your hips and shoulders.  Take over-the-counter and prescription medicines as told by your health care provider. This information is not intended to replace advice given to you by your health care provider. Make sure you discuss any questions you have with your health care provider. Document Released: 10/24/2008 Document Revised: 02/07/2017 Document Reviewed: 02/07/2017 Elsevier Interactive Patient Education  2019  [employer].

## 2018-01-31 LAB — CBC WITH DIFFERENTIAL/PLATELET
BASOS: 1 %
Basophils Absolute: 0 10*3/uL (ref 0.0–0.2)
EOS (ABSOLUTE): 0.1 10*3/uL (ref 0.0–0.4)
Eos: 3 %
Hematocrit: 45.4 % (ref 37.5–51.0)
Hemoglobin: 15 g/dL (ref 13.0–17.7)
Immature Grans (Abs): 0 10*3/uL (ref 0.0–0.1)
Immature Granulocytes: 0 %
LYMPHS ABS: 1.3 10*3/uL (ref 0.7–3.1)
Lymphs: 38 %
MCH: 28.1 pg (ref 26.6–33.0)
MCHC: 33 g/dL (ref 31.5–35.7)
MCV: 85 fL (ref 79–97)
MONOCYTES: 12 %
Monocytes Absolute: 0.4 10*3/uL (ref 0.1–0.9)
Neutrophils Absolute: 1.6 10*3/uL (ref 1.4–7.0)
Neutrophils: 46 %
Platelets: 138 10*3/uL — ABNORMAL LOW (ref 150–450)
RBC: 5.34 x10E6/uL (ref 4.14–5.80)
RDW: 14 % (ref 11.6–15.4)
WBC: 3.5 10*3/uL (ref 3.4–10.8)

## 2018-01-31 LAB — C-REACTIVE PROTEIN: CRP: <1 mg/L (ref 0–10)

## 2018-01-31 LAB — SEDIMENTATION RATE: Sed Rate: 6 mm/hr (ref 0–30)

## 2018-02-01 ENCOUNTER — Telehealth: Payer: Self-pay | Admitting: Family Medicine

## 2018-02-01 DIAGNOSIS — G44059 Short lasting unilateral neuralgiform headache with conjunctival injection and tearing (SUNCT), not intractable: Secondary | ICD-10-CM | POA: Insufficient documentation

## 2018-02-01 DIAGNOSIS — R519 Headache, unspecified: Secondary | ICD-10-CM | POA: Insufficient documentation

## 2018-02-01 DIAGNOSIS — R51 Headache: Secondary | ICD-10-CM

## 2018-02-01 NOTE — Telephone Encounter (Signed)
CT approved. Authorization # [id number]. Valid 02/01/18-03/02/18.

## 2018-02-01 NOTE — Telephone Encounter (Signed)
Contact patient to advise recent labs were negative of any inflammatory process which makes temporal arteritis less likely the cause of his recent headaches occurring intermittently over 3 months.  Therefore I would like to get a CT of the head noncontrast study performed.  Please schedule CT according to patient schedule I would personally like to have this done as soon as possible.  CT is being ordered to further evaluate the cause of headaches.  I like him to continue the gabapentin as well as ibuprofen for headache management.  If headaches become severe he should go immediately to the ER for further evaluation.  He will be notified of the results of the CT once received.

## 2018-02-01 NOTE — Telephone Encounter (Signed)
Patient notified of lab results & recommendations. Expressed understanding. Aware that I will be calling him back with appointment information for the CT scan. States that he prefers a morning appointment.

## 2018-02-01 NOTE — Telephone Encounter (Signed)
CT scheduled for 7:15 AM 02/05/2018 at [facility]. Patient will go to 1st floor radiology department.

## 2018-02-02 ENCOUNTER — Encounter: Payer: Self-pay | Admitting: Neurology

## 2018-02-02 NOTE — Telephone Encounter (Signed)
Can you notify patient of appointment?

## 2018-02-05 ENCOUNTER — Ambulatory Visit (HOSPITAL_COMMUNITY): Payer: BLUE CROSS/BLUE SHIELD

## 2018-02-08 ENCOUNTER — Telehealth: Payer: Self-pay | Admitting: Family Medicine

## 2018-02-08 ENCOUNTER — Ambulatory Visit (HOSPITAL_COMMUNITY)
Admission: RE | Admit: 2018-02-08 | Discharge: 2018-02-08 | Disposition: A | Discharge status: Home / Self Care | Payer: BLUE CROSS/BLUE SHIELD | Source: Ambulatory Visit | Attending: Family Medicine | Admitting: Family Medicine

## 2018-02-08 ENCOUNTER — Encounter (HOSPITAL_COMMUNITY): Payer: Self-pay

## 2018-02-08 DIAGNOSIS — G44059 Short lasting unilateral neuralgiform headache with conjunctival injection and tearing (SUNCT), not intractable: Secondary | ICD-10-CM | POA: Insufficient documentation

## 2018-02-08 NOTE — Telephone Encounter (Signed)
CT of head

## 2018-02-09 NOTE — Addendum Note (Signed)
Addended by: Scot Jun on: 02/09/2018 08:05 AM   Modules accepted: Orders

## 2018-02-09 NOTE — Telephone Encounter (Signed)
Patient notified of CT results & recommendations. Expressed understanding. Is aware of appointment with Dr. Posey Pronto.

## 2018-02-09 NOTE — Telephone Encounter (Signed)
Contact patient to advise recent head CT was unremarkable and did not explain the cause of headaches. He has been referred to neurology and is scheduled to follow-up with Dr. Posey Pronto 04/11/2018. In the meantime, continue Gabapentin for headache prevention and neuropathic pain and Tylenol 500 mg every 4-6 hours as needed for acute onset headaches. If symptom worsen he should go immediately to the ER.

## 2018-02-26 ENCOUNTER — Other Ambulatory Visit: Payer: Self-pay

## 2018-02-26 ENCOUNTER — Ambulatory Visit: Payer: PRIVATE HEALTH INSURANCE | Admitting: Family Medicine

## 2018-02-26 ENCOUNTER — Encounter: Payer: Self-pay | Admitting: Anesthesiology

## 2018-02-26 ENCOUNTER — Ambulatory Visit: Payer: BLUE CROSS/BLUE SHIELD | Attending: Anesthesiology | Admitting: Anesthesiology

## 2018-02-26 VITALS — BP 105/76 | HR 66 | Temp 98.2°F | Resp 16 | Ht 76.0 in | Wt 200.0 lb

## 2018-02-26 DIAGNOSIS — M5136 Other intervertebral disc degeneration, lumbar region: Secondary | ICD-10-CM | POA: Insufficient documentation

## 2018-02-26 DIAGNOSIS — M961 Postlaminectomy syndrome, not elsewhere classified: Secondary | ICD-10-CM

## 2018-02-26 DIAGNOSIS — M48062 Spinal stenosis, lumbar region with neurogenic claudication: Secondary | ICD-10-CM | POA: Diagnosis not present

## 2018-02-26 DIAGNOSIS — F1190 Opioid use, unspecified, uncomplicated: Secondary | ICD-10-CM | POA: Insufficient documentation

## 2018-02-26 DIAGNOSIS — M5432 Sciatica, left side: Secondary | ICD-10-CM

## 2018-02-26 DIAGNOSIS — M5431 Sciatica, right side: Secondary | ICD-10-CM | POA: Diagnosis not present

## 2018-02-26 DIAGNOSIS — G894 Chronic pain syndrome: Secondary | ICD-10-CM | POA: Insufficient documentation

## 2018-02-26 NOTE — Progress Notes (Signed)
Safety precautions to be maintained throughout the outpatient stay will include: orient to surroundings, keep bed in low position, maintain call bell within reach at all times, provide assistance with transfer out of bed and ambulation.  

## 2018-02-26 NOTE — Progress Notes (Signed)
Subjective:  [name], male    DOB: [birth year]  Age: 63 y.o. MRN: [id number]  CC: Back Pain (lower)     PROCEDURE: None  HPI Cameron Cardenas presents for a new patient evaluation.  He is a pleasant 63 year old black male with a longstanding history of low back pain.  He has been referred by Dr. Vertell Limber in [place] for evaluation and treatment.  He has had 3 previous back surgeries.  Most recently in 2016 and despite this continues to have chronic low back pain with professed weakness to the lower extremities.  In the past he has been on oxycodone 2-3 times a day and he has experienced good relief from this.  He describes a maximum pain score of 8 and 9 or 10 and states that he is almost always in pain.  He has been off oxycodone recently however based on his most recent evaluation with Dr. Vertell Limber has been referred here to consider possible injection therapy and reinstitution of chronic opioid pain medication.  He states that the pain he feels is a gnawing aching pain starting in the low back with radiation into both feet calves and lower extremities.  He has chronic weakness affecting both legs which seems to be worse in the left lower leg but also he is experiencing give way weakness in the right leg.  He uses a cane for ambulation.  The pain bothers him throughout the day and has failed conservative therapy with anti-inflammatory medications non-opioids and physical therapy.  The physical therapy did give him some relief however this was insufficient.  He has smoked marijuana and he brings that up himself today.  He also has had epidural steroid injections most recently in the summer 2019 and he feels that these did help with his pain.  He was recently placed on disability in January.  History Cameron Cardenas has a past medical history of Allergic rhinitis due to pollen, DDD (degenerative disc disease), lumbar, Essential hypertension, malignant, Hemorrhoids, Lumbago, Plantar fasciitis of  left foot, Pure hypercholesterolemia, Type II or unspecified type diabetes mellitus without mention of complication, not stated as uncontrolled, and Unspecified vitamin D deficiency.   He has a past surgical history that includes Lumbar disc surgery (2011); Colonoscopy; and Maximum access (mas)posterior lumbar interbody fusion (plif) 1 level (N/A, 06/10/2014).   His family history includes Cataracts in his sister; Diabetes in his sister; Hypertension in his father and mother; Seizures in his brother.He reports that he quit smoking about 25 years ago. He has never used smokeless tobacco. He reports previous alcohol use. He reports current drug use. Drug: Marijuana.  No procedure found.  No results found for: TOXASSSELUR  Outpatient Medications Prior to Visit  Medication Sig Dispense Refill  . aspirin 81 MG tablet Take 81 mg by mouth daily.    . benzonatate (TESSALON) 100 MG capsule Take 1-2 capsules (100-200 mg total) by mouth 3 (three) times daily as needed for cough. 40 capsule 0  . ezetimibe (ZETIA) 10 MG tablet Take 1 tablet (10 mg total) by mouth daily. 90 tablet 2  . gabapentin (NEURONTIN) 100 MG capsule Take 3 capsules (300 mg total) by mouth at bedtime. 90 capsule 3  . ibuprofen (ADVIL,MOTRIN) 600 MG tablet TAKE 1 TABLET BY MOUTH THREE TIMES DAILY FOR 30 DAYS  2  . lisinopril-hydrochlorothiazide (PRINZIDE,ZESTORETIC) 10-12.5 MG tablet TAKE 1 TABLET BY MOUTH ONCE DAILY 30 tablet 2  . pioglitazone-metformin (ACTOPLUS MET) 15-500 MG tablet TAKE 1 TABLET BY MOUTH  TWICE DAILY 60 tablet 2  . rosuvastatin (CRESTOR) 20 MG tablet TAKE 1 TABLET BY MOUTH ONCE DAILY 30 tablet 5   No facility-administered medications prior to visit.    Lab Results  Component Value Date   WBC 3.5 01/30/2018   HGB 15.0 01/30/2018   HCT 45.4 01/30/2018   PLT 138 (L) 01/30/2018   GLUCOSE 114 (H) 10/16/2017   CHOL 146 10/16/2017   TRIG 41.0 10/16/2017   HDL 79.30 10/16/2017   LDLCALC 59 10/16/2017   ALT 60 (H)  10/16/2017   AST 34 10/16/2017   NA 141 10/16/2017   K 3.8 10/16/2017   CL 106 10/16/2017   CREATININE 1.19 10/16/2017   BUN 14 10/16/2017   CO2 31 10/16/2017   INR 1.14 07/30/2009   HGBA1C 6.0 10/16/2017   MICROALBUR 0.7 07/12/2016    --------------------------------------------------------------------------------------------------------------------- Ct Head Wo Contrast  Result Date: 02/08/2018 CLINICAL DATA:  63 year old male with headaches for 2-3 months. EXAM: CT HEAD WITHOUT CONTRAST TECHNIQUE: Contiguous axial images were obtained from the base of the skull through the vertex without intravenous contrast. COMPARISON:  None. FINDINGS: Brain: Cerebral volume is within normal limits for age. No midline shift, ventriculomegaly, mass effect, evidence of mass lesion, intracranial hemorrhage or evidence of cortically based acute infarction. Gray-white matter differentiation is within normal limits throughout the brain. Vascular: Calcified atherosclerosis at the skull base. No suspicious intracranial vascular hyperdensity. Skull: Negative. Sinuses/Orbits: Visualized paranasal sinuses and mastoids are well pneumatized. Other: Postoperative changes to both globes, otherwise negative orbits. Negative visible scalp soft tissues. IMPRESSION: Negative head CT. Normal for age non contrast CT appearance of the brain. Electronically Signed   By: Genevie Ann M.D.   On: 02/08/2018 10:20       ---------------------------------------------------------------------------------------------------------------------- Past Medical History:  Diagnosis Date  . Allergic rhinitis due to pollen   . DDD (degenerative disc disease), lumbar   . Essential hypertension, malignant   . Hemorrhoids   . Lumbago   . Plantar fasciitis of left foot   . Pure hypercholesterolemia   . Type II or unspecified type diabetes mellitus without mention of complication, not stated as uncontrolled    dx in 2005  . Unspecified vitamin D  deficiency     Past Surgical History:  Procedure Laterality Date  . COLONOSCOPY     in [facility], MD no longer in practice, does not recall the name of facility. Does belive polyps were removed  . LUMBAR DISC SURGERY  2011   total of 3 sx on back per pt  . MAXIMUM ACCESS (MAS)POSTERIOR LUMBAR INTERBODY FUSION (PLIF) 1 LEVEL N/A 06/10/2014   Procedure: Redo Lumbar Five-Sacral One Decompression with maximum access posterior lumbar interbody fusion;  Surgeon: Erline Levine, MD;  Location: [facility];  Service: Neurosurgery;  Laterality: N/A;  Redo Decompression with maximum access posterior lumbar interbody fusion, L5-S1    Family History  Problem Relation Age of Onset  . Hypertension Mother   . Hypertension Father   . Diabetes Sister   . Cataracts Sister   . Seizures Brother        caused his death  . Colon cancer Neg Hx   . Esophageal cancer Neg Hx   . Rectal cancer Neg Hx   . Stomach cancer Neg Hx   . Heart disease Neg Hx   . Colon polyps Neg Hx     Social History   Tobacco Use  . Smoking status: Former Smoker    Last attempt to quit: 08/28/1992  Years since quitting: 25.5  . Smokeless tobacco: Never Used  Substance Use Topics  . Alcohol use: Not Currently    Comment: occassional    ---------------------------------------------------------------------------------------------------------------------  Scheduled Meds: Continuous Infusions: PRN Meds:.   BP 105/76   Pulse 66   Temp 98.2 F (36.8 C) (Oral)   Resp 16   Ht '6\' 4"'$  (1.93 m)   Wt 200 lb (90.7 kg)   SpO2 99%   BMI 24.34 kg/m    BP Readings from Last 3 Encounters:  02/26/18 105/76  01/30/18 109/63  11/01/17 108/67     Wt Readings from Last 3 Encounters:  02/26/18 200 lb (90.7 kg)  01/30/18 200 lb 3.2 oz (90.8 kg)  11/01/17 199 lb (90.3 kg)     ----------------------------------------------------------------------------------------------------------------------  ROS Review of Systems Patient  is alert oriented cooperative compliant and a good historian CNS: No sedation or confusion Cardiac: No angina or palpitations Pulmonary: No shortness of breath or dyspnea Musculoskeletal: Chronic weakness affecting the lower extremities and worse with ambulation.  Left appears to be greater than right lower extremity weakness.  Objective:  BP 105/76   Pulse 66   Temp 98.2 F (36.8 C) (Oral)   Resp 16   Ht '6\' 4"'$  (1.93 m)   Wt 200 lb (90.7 kg)   SpO2 99%   BMI 24.34 kg/m   Physical Exam Patient is alert oriented cooperative. Heart is regular rate and rhythm without murmur or rub Lungs are clear to auscultation without wheezes or rales Inspection lower extremity reveals diffuse lower extremity weakness rated at 4/5 with flexion extension of the left foot and 5-/5 the right foot.  He has a positive straight leg raise at about 10 degrees on the right side causing back pain likewise on the left side.  He has diminished strength to flexion extension at both knees on examination.  He has exquisite lumbar paraspinous muscle tenderness and has great difficulty logrolling into the supine position from seated position.  He ambulates with this very antalgic gait but the scar in the low back is well-healed.  He has pain on both flexion extension at the low back.     Assessment & Plan:   Lukus was seen today for back pain.  Diagnoses and all orders for this visit:  DDD (degenerative disc disease), lumbar  Bilateral sciatica  Failed back syndrome of lumbar spine  Spinal stenosis of lumbar region with neurogenic claudication  Chronic, continuous use of opioids -     ToxASSURE Select 13 (MW), Urine  Chronic pain syndrome -     ToxASSURE Select 13 (MW), Urine     ----------------------------------------------------------------------------------------------------------------------  Problem List Items Addressed This Visit    None    Visit Diagnoses    DDD (degenerative disc  disease), lumbar    -  Primary   Bilateral sciatica       Failed back syndrome of lumbar spine       Spinal stenosis of lumbar region with neurogenic claudication       Chronic, continuous use of opioids       Relevant Orders   ToxASSURE Select 13 (MW), Urine   Chronic pain syndrome       Relevant Orders   ToxASSURE Select 13 (MW), Urine      ----------------------------------------------------------------------------------------------------------------------  1. DDD (degenerative disc disease), lumbar His last MRI was reviewed today.  I have gone over this with him.  He is [facility]  and we will await this prior to proceeding with any further interventional pain management epidurals.  2. Bilateral sciatica As above and continue physical therapy exercises as tolerated  3. Failed back syndrome of lumbar spine Based on the chronicity of his pain I think he would be a good opioid candidate however he has utilized [facility] recently.  We will defer a UDS screen until his next visit in 1 month.  We have talked to him about the clinic policy regarding opioid management and THC.  He understands this and will discontinue marijuana and we will be checking his UDS randomly for management.  4. Spinal stenosis of lumbar region with neurogenic claudication As above  5. Chronic, continuous use of opioids Return to clinic in 1 month.  We have reviewed the practitioner database information today. - ToxASSURE Select 13 (MW), Urine  6. Chronic pain syndrome As above and continue follow-up with Dr. Vertell Limber and primary care doctors. - ToxASSURE Select 13 (MW), Urine    ----------------------------------------------------------------------------------------------------------------------  I am having Wataru L. Purkey maintain his aspirin, ezetimibe, ibuprofen, pioglitazone-metformin, lisinopril-hydrochlorothiazide, rosuvastatin, gabapentin, and benzonatate.   No  orders of the defined types were placed in this encounter.      Follow-up: Return in about 1 month (around 03/27/2018) for evaluation, med refill.    Molli Barrows, MD 2:25 PM  The [facility] practitioner database for opioid medications on this patient has been reviewed by me and my staff   Greater than 50% of the total encounter time was spent in counseling and / or coordination of care.     This dictation was performed utilizing Systems analyst.  Please excuse any unintentional or mistaken typographical errors as a result.

## 2018-02-27 ENCOUNTER — Encounter: Payer: Self-pay | Admitting: Interventional Cardiology

## 2018-03-09 ENCOUNTER — Other Ambulatory Visit: Payer: Self-pay | Admitting: Endocrinology

## 2018-03-13 ENCOUNTER — Other Ambulatory Visit: Payer: Self-pay | Admitting: Endocrinology

## 2018-03-15 ENCOUNTER — Ambulatory Visit: Payer: PRIVATE HEALTH INSURANCE | Admitting: Interventional Cardiology

## 2018-03-15 ENCOUNTER — Encounter: Payer: Self-pay | Admitting: Interventional Cardiology

## 2018-03-15 VITALS — BP 108/64 | HR 77 | Ht 76.0 in | Wt 198.0 lb

## 2018-03-15 DIAGNOSIS — R000 Tachycardia, unspecified: Secondary | ICD-10-CM

## 2018-03-15 DIAGNOSIS — E785 Hyperlipidemia, unspecified: Secondary | ICD-10-CM | POA: Diagnosis not present

## 2018-03-15 DIAGNOSIS — E118 Type 2 diabetes mellitus with unspecified complications: Secondary | ICD-10-CM

## 2018-03-15 DIAGNOSIS — I10 Essential (primary) hypertension: Secondary | ICD-10-CM

## 2018-03-15 DIAGNOSIS — R55 Syncope and collapse: Secondary | ICD-10-CM

## 2018-03-15 DIAGNOSIS — R0789 Other chest pain: Secondary | ICD-10-CM

## 2018-03-15 DIAGNOSIS — I452 Bifascicular block: Secondary | ICD-10-CM

## 2018-03-15 MED ORDER — METOPROLOL TARTRATE 100 MG PO TABS: ORAL_TABLET | ORAL | 0 refills | Status: DC

## 2018-03-15 NOTE — Patient Instructions (Addendum)
Medication Instructions:  Your physician recommends that you continue on your current medications as directed. Please refer to the Current Medication list given to you today.  If you need a refill on your cardiac medications before your next appointment, please call your pharmacy.   Lab work: BMET prior to CT  If you have labs (blood work) drawn today and your tests are completely normal, you will receive your results only by: Marland Kitchen MyChart Message (if you have MyChart) OR . A paper copy in the mail If you have any lab test that is abnormal or we need to change your treatment, we will call you to review the results.  Testing/Procedures: Your physician has recommended that you wear an event monitor. Event monitors are medical devices that record the heart's electrical activity. Doctors most often [name] these monitors to diagnose arrhythmias. Arrhythmias are problems with the speed or rhythm of the heartbeat. The monitor is a small, portable device. You can wear one while you do your normal daily activities. This is usually used to diagnose what is causing palpitations/syncope (passing out).  Your physician has requested that you have cardiac CT. Cardiac computed tomography (CT) is a painless test that uses an x-ray machine to take clear, detailed pictures of your heart. For further information please visit [employer]uge[employer]iest[employer]tn. Please follow instruction sheet as given.    Follow-Up: At [facility], you and your health needs are our priority.  As part of our continuing mission to provide you with exceptional heart care, we have created designated Provider Care Teams.  These Care Teams include your primary Cardiologist (physician) and Advanced Practice Providers (APPs -  Physician Assistants and Nurse Practitioners) who all work together to provide you with the care you need, when you need it. You will need a follow up appointment in 6 weeks or 1-2 weeks after testing is complete, whichever comes  first.  Please call our office 2 months in advance to schedule this appointment.  You may see Sinclair Grooms, MD or one of the following Advanced Practice Providers on your designated Care Team:   Truitt Merle, NP Cecilie Kicks, NP . Kathyrn Drown, NP  Any Other Special Instructions Will Be Listed Below (If Applicable).  Please arrive at the [facility] main entrance of [facility] at xx:xx AM (30-45 minutes prior to test start time)  [facility] [place], [place] [address] [phone]  Proceed to the [facility] Radiology Department (First Floor).  Please follow these instructions carefully (unless otherwise directed):  Hold all erectile dysfunction medications at least 48 hours prior to test.  On the Night Before the Test: . Be sure to Drink plenty of water. . Do not consume any caffeinated/decaffeinated beverages or chocolate 12 hours prior to your test. . Do not take any antihistamines 12 hours prior to your test. . If you take Metformin do not take 24 hours prior to test.  On the Day of the Test: . Drink plenty of water. Do not drink any water within one hour of the test. . Do not eat any food 4 hours prior to the test. . You may take your regular medications prior to the test.  . Take metoprolol (Lopressor) two hours prior to test. . HOLD Furosemide/Hydrochlorothiazide morning of the test.       After the Test: . Drink plenty of water. . After receiving IV contrast, you may experience a mild flushed feeling. This is normal. . On occasion, you  may experience a mild rash up to 24 hours after the test. This is not dangerous. If this occurs, you can take Benadryl 25 mg and increase your fluid intake. . If you experience trouble breathing, this can be serious. If it is severe call 911 IMMEDIATELY. If it is mild, please call our office. . If you take any of these medications: Glipizide/Metformin, Avandament, Glucavance, please do not take  48 hours after completing test.

## 2018-03-15 NOTE — Progress Notes (Signed)
Cardiology Office Note:    Date:  03/15/2018   ID:  JESTER KLINGBERG, DOB [birth year], MRN [id number]  PCP:  Scot Jun, FNP  Cardiologist:  Sinclair Grooms, MD   Referring MD: Merrilee Seashore, MD   Chief Complaint  Patient presents with  . Chest Pain  . Irregular Heart Beat    Racing heart    History of Present Illness:    Cameron Cardenas is a 63 y.o. male with a hx of hyperlipidemia, type 2 diabetes mellitus, essential hypertension, and abnormal EKG with bifascicular block returning for clinical follow-up.  Mr. Hoh was seen last in July 2018.  He has subsequently had multiple back operations and is now on disability (Dr. Vertell Limber).  He is back because he continues to have difficulty with the sensation that his heart starts racing and then his chest hurts.  Episodes of becoming increasingly frequent.  Last episode was 10 days ago.  He estimates he is having a significant episode of tachycardia and chest discomfort that last up to 20 minutes at least once per month.  He feels great today.  Typical activity, does not cause chest discomfort or dyspnea.  The level of vigor with activity is decreased because of his chronic back discomfort.  He denies orthopnea, PND, and swelling.  Past Medical History:  Diagnosis Date  . Allergic rhinitis due to pollen   . DDD (degenerative disc disease), lumbar   . Essential hypertension, malignant   . Hemorrhoids   . Lumbago   . Plantar fasciitis of left foot   . Pure hypercholesterolemia   . Type II or unspecified type diabetes mellitus without mention of complication, not stated as uncontrolled    dx in 2005  . Unspecified vitamin D deficiency     Past Surgical History:  Procedure Laterality Date  . COLONOSCOPY     in [facility], MD no longer in practice, does not recall the name of facility. Does belive polyps were removed  . LUMBAR DISC SURGERY  2011   total of 3 sx on back per pt  . MAXIMUM ACCESS (MAS)POSTERIOR LUMBAR INTERBODY  FUSION (PLIF) 1 LEVEL N/A 06/10/2014   Procedure: Redo Lumbar Five-Sacral One Decompression with maximum access posterior lumbar interbody fusion;  Surgeon: Erline Levine, MD;  Location: [facility];  Service: Neurosurgery;  Laterality: N/A;  Redo Decompression with maximum access posterior lumbar interbody fusion, L5-S1    Current Medications: Current Meds  Medication Sig  . aspirin 81 MG tablet Take 81 mg by mouth daily.  . brompheniramine-pseudoephedrine-DM 30-2-10 MG/5ML syrup TAKE 5 ML BY MOUTH AS NEEDED THREE TIMES DAILY FOR 14 DAYS  . ezetimibe (ZETIA) 10 MG tablet Take 1 tablet (10 mg total) by mouth daily.  Marland Kitchen ibuprofen (ADVIL,MOTRIN) 600 MG tablet TAKE 1 TABLET BY MOUTH THREE TIMES DAILY FOR 30 DAYS  . lisinopril-hydrochlorothiazide (PRINZIDE,ZESTORETIC) 10-12.5 MG tablet Take 1 tablet by mouth once daily  . pioglitazone-metformin (ACTOPLUS MET) 15-500 MG tablet TAKE 1 TABLET BY MOUTH TWICE DAILY  . rosuvastatin (CRESTOR) 20 MG tablet TAKE 1 TABLET BY MOUTH ONCE DAILY     Allergies:   Percocet [oxycodone-acetaminophen]   Social History   Socioeconomic History  . Marital status: Married    Spouse name: Not on file  . Number of children: Not on file  . Years of education: Not on file  . Highest education level: Not on file  Occupational History  . Occupation: Sales executive [employer]  Social Needs  . Financial resource strain: Not on file  . Food insecurity:    Worry: Not on file    Inability: Not on file  . Transportation needs:    Medical: Not on file    Non-medical: Not on file  Tobacco Use  . Smoking status: Former Smoker    Last attempt to quit: 08/28/1992    Years since quitting: 25.5  . Smokeless tobacco: Never Used  Substance and Sexual Activity  . Alcohol use: Not Currently    Comment: occassional  . Drug use: Yes    Types: Marijuana  . Sexual activity: Not on file  Lifestyle  . Physical activity:    Days per week: Not on file    Minutes per  session: Not on file  . Stress: Not on file  Relationships  . Social connections:    Talks on phone: Not on file    Gets together: Not on file    Attends religious service: Not on file    Active member of club or organization: Not on file    Attends meetings of clubs or organizations: Not on file    Relationship status: Not on file  Other Topics Concern  . Not on file  Social History Narrative   Employed in automobile sales   Married   Occasional alcohol and marijuana no tobacco     Family History: The patient's family history includes Cataracts in his sister; Diabetes in his sister; Hypertension in his father and mother; Seizures in his brother. There is no history of Colon cancer, Esophageal cancer, Rectal cancer, Stomach cancer, Heart disease, or Colon polyps.  ROS:   Please see the history of present illness.    Vision disturbance, back pain, skipping heartbeat.  All other systems reviewed and are negative.  EKGs/Labs/Other Studies Reviewed:    The following studies were reviewed today:  Cardiac imaging was not possible at the last visit greater than a year ago because he did not have insurance.  Myocardial perfusion study by Dr. Terrence Dupont 2011 without evidence of ischemia.  EKG:  EKG performed 03/15/2018 demonstrated right bundle branch block, left anterior hemiblock, and PACs.  Left ventricular hypertrophy is noted.  When compared to the tracing from August 01, 2016 shows no change with the exception of PACs currently present.  Recent Labs: 10/16/2017: ALT 60; BUN 14; Creatinine, Ser 1.19; Potassium 3.8; Sodium 141 01/30/2018: Hemoglobin 15.0; Platelets 138  Recent Lipid Panel    Component Value Date/Time   CHOL 146 10/16/2017 0815   TRIG 41.0 10/16/2017 0815   HDL 79.30 10/16/2017 0815   CHOLHDL 2 10/16/2017 0815   VLDL 8.2 10/16/2017 0815   LDLCALC 59 10/16/2017 0815    Physical Exam:    VS:  BP 108/64   Pulse 77   Ht '6\' 4"'$  (1.93 m)   Wt 198 lb (89.8 kg)   SpO2  98%   BMI 24.10 kg/m     Wt Readings from Last 3 Encounters:  03/15/18 198 lb (89.8 kg)  02/26/18 200 lb (90.7 kg)  01/30/18 200 lb 3.2 oz (90.8 kg)     GEN: Tall, without evidence of obesity. No acute distress HEENT: Normal NECK: No JVD. LYMPHATICS: No lymphadenopathy CARDIAC: RRR.  No murmur, no gallop, no edema VASCULAR: 2+ bilateral radial pulses, no bruits RESPIRATORY:  Clear to auscultation without rales, wheezing or rhonchi  ABDOMEN: Soft, non-tender, non-distended, No pulsatile mass, MUSCULOSKELETAL: No deformity  SKIN: Warm and dry NEUROLOGIC:  Alert and  oriented x 3 PSYCHIATRIC:  Normal affect   ASSESSMENT:    1. Chest discomfort   2. Essential hypertension   3. Type 2 diabetes mellitus with complication, without long-term current use of insulin ([place])   4. Hyperlipidemia with target LDL less than 100   5. Bifascicular bundle branch block   6. Tachycardia   7. Pre-syncope    PLAN:    In order of problems listed above:  1. The chest discomfort is right subclavicular and occurs when his heart starts racing and skipping.  Discomfort is quite severe.  Fast heart beating occurs before the chest starts to hurt.  It goes away promptly after the heart rate decreases.  Rule out PSVT / /VT / A. fib.  30-day monitor is ordered.  Coronary CT angiogram with morphology and FFR if needed is also ordered. 2. Target blood pressure 130/80 3. Hemoglobin A1c in October was 6.0. 4. Markedly abnormal EKG with bifascicular block. 5. Rule out VT/AF/SVT.  30-day monitor is ordered.  He has risk factors including diabetes, hypertension, and hyperlipidemia.  Since he has significant chest discomfort with episodes of tachycardia, a coronary CT with morphology to exclude significant obstructive disease will be performed.  A 30-day monitor will be performed to exclude life-threatening arrhythmia/A. fib.   Medication Adjustments/Labs and Tests Ordered: Current medicines are reviewed at  length with the patient today.  Concerns regarding medicines are outlined above.  Orders Placed This Encounter  Procedures  . CT CORONARY MORPH W/CTA COR W/SCORE W/CA W/CM &/OR WO/CM  . CT CORONARY FRACTIONAL FLOW RESERVE DATA PREP  . CT CORONARY FRACTIONAL FLOW RESERVE FLUID ANALYSIS  . Basic metabolic panel  . Cardiac event monitor  . EKG 12-Lead   Meds ordered this encounter  Medications  . metoprolol tartrate (LOPRESSOR) 100 MG tablet    Sig: Take one tablet by mouth 2 hours prior to CT    Dispense:  1 tablet    Refill:  0    Patient Instructions  Medication Instructions:  Your physician recommends that you continue on your current medications as directed. Please refer to the Current Medication list given to you today.  If you need a refill on your cardiac medications before your next appointment, please call your pharmacy.   Lab work: BMET prior to CT  If you have labs (blood work) drawn today and your tests are completely normal, you will receive your results only by: Marland Kitchen MyChart Message (if you have MyChart) OR . A paper copy in the mail If you have any lab test that is abnormal or we need to change your treatment, we will call you to review the results.  Testing/Procedures: Your physician has recommended that you wear an event monitor. Event monitors are medical devices that record the heart's electrical activity. Doctors most often [name] these monitors to diagnose arrhythmias. Arrhythmias are problems with the speed or rhythm of the heartbeat. The monitor is a small, portable device. You can wear one while you do your normal daily activities. This is usually used to diagnose what is causing palpitations/syncope (passing out).  Your physician has requested that you have cardiac CT. Cardiac computed tomography (CT) is a painless test that uses an x-ray machine to take clear, detailed pictures of your heart. For further information please visit HugeFiesta[employer]tn. Please follow  instruction sheet as given.    Follow-Up: At [facility], you and your health needs are our priority.  As part of our continuing mission to provide you with  exceptional heart care, we have created designated Provider Care Teams.  These Care Teams include your primary Cardiologist (physician) and Advanced Practice Providers (APPs -  Physician Assistants and Nurse Practitioners) who all work together to provide you with the care you need, when you need it. You will need a follow up appointment in 6 weeks or 1-2 weeks after testing is complete, whichever comes first.  Please call our office 2 months in advance to schedule this appointment.  You may see Sinclair Grooms, MD or one of the following Advanced Practice Providers on your designated Care Team:   Truitt Merle, NP Cecilie Kicks, NP . Kathyrn Drown, NP  Any Other Special Instructions Will Be Listed Below (If Applicable).  Please arrive at the [facility] main entrance of [facility] at xx:xx AM (30-45 minutes prior to test start time)  [facility] [place],  [phone]  Proceed to the [facility] Radiology Department (First Floor).  Please follow these instructions carefully (unless otherwise directed):  Hold all erectile dysfunction medications at least 48 hours prior to test.  On the Night Before the Test: . Be sure to Drink plenty of water. . Do not consume any caffeinated/decaffeinated beverages or chocolate 12 hours prior to your test. . Do not take any antihistamines 12 hours prior to your test. . If you take Metformin do not take 24 hours prior to test.  On the Day of the Test: . Drink plenty of water. Do not drink any water within one hour of the test. . Do not eat any food 4 hours prior to the test. . You may take your regular medications prior to the test.  . Take metoprolol (Lopressor) two hours prior to test. . HOLD Furosemide/Hydrochlorothiazide morning of  the test.       After the Test: . Drink plenty of water. . After receiving IV contrast, you may experience a mild flushed feeling. This is normal. . On occasion, you may experience a mild rash up to 24 hours after the test. This is not dangerous. If this occurs, you can take Benadryl 25 mg and increase your fluid intake. . If you experience trouble breathing, this can be serious. If it is severe call 911 IMMEDIATELY. If it is mild, please call our office. . If you take any of these medications: Glipizide/Metformin, Avandament, Glucavance, please do not take 48 hours after completing test.       Signed, Sinclair Grooms, MD  03/15/2018 5:10 PM    [place]

## 2018-03-19 ENCOUNTER — Other Ambulatory Visit: Payer: BLUE CROSS/BLUE SHIELD | Admitting: *Deleted

## 2018-03-19 ENCOUNTER — Ambulatory Visit (INDEPENDENT_AMBULATORY_CARE_PROVIDER_SITE_OTHER): Payer: BLUE CROSS/BLUE SHIELD

## 2018-03-19 DIAGNOSIS — R000 Tachycardia, unspecified: Secondary | ICD-10-CM | POA: Diagnosis not present

## 2018-03-19 DIAGNOSIS — R0789 Other chest pain: Secondary | ICD-10-CM

## 2018-03-19 DIAGNOSIS — R55 Syncope and collapse: Secondary | ICD-10-CM | POA: Diagnosis not present

## 2018-03-19 DIAGNOSIS — I10 Essential (primary) hypertension: Secondary | ICD-10-CM

## 2018-03-20 LAB — BASIC METABOLIC PANEL
BUN/Creatinine Ratio: 13 (ref 10–24)
BUN: 14 mg/dL (ref 8–27)
CO2: 23 mmol/L (ref 20–29)
Calcium: 9.8 mg/dL (ref 8.6–10.2)
Chloride: 106 mmol/L (ref 96–106)
Creatinine, Ser: 1.07 mg/dL (ref 0.76–1.27)
GFR calc Af Amer: 85 mL/min/{1.73_m2} (ref >59)
GFR, EST NON AFRICAN AMERICAN: 73 mL/min/{1.73_m2} (ref >59)
Glucose: 108 mg/dL — ABNORMAL HIGH (ref 65–99)
Potassium: 3.7 mmol/L (ref 3.5–5.2)
Sodium: 146 mmol/L — ABNORMAL HIGH (ref 134–144)

## 2018-03-20 NOTE — Progress Notes (Signed)
[facility] Clinic Note - Initial Visit   Date: 03/21/18  Cameron Cardenas MRN: [id number] DOB: Mar 28, 1955   Dear Carroll Sage. Kenton Kingfisher, FNP:  Thank you for your kind referral of Cameron Cardenas for consultation of diabetic neuropathy. Although his history is well known to you, please allow [name] to reiterate it for the purpose of our medical record. The patient was accompanied to the clinic by self.   History of Present Illness: Cameron Cardenas is a 63 y.o. right-handed African American male with diabetes mellitus (2004), chronic low back pain s/p lumbar surgery x 3, hypertension, and hyperlipidemia presenting for evaluation of neuropathy.   He had a long history of chronic pain and had three prior lumbar surgeries. In addition to low back pain, he also complains of generalized pain over the arms, hands, legs, and feet.  Starting in 2019, he began having numbness and tingling over the feet and lower legs.  He had NCS/EMG performed at [facility] in 2019, however I do not have these results to review. He was asked to see me for evaluation of neuropathy.  He complains of imbalance when his back is hurting, but otherwise when pain-free, he does not have problems with balance.  For the same reason, he uses a cane.  His diabetes is well-controlled, last HbA1c 6.0.  He drinks alcohol a few times per month, no history of alcohol abuse.   Out-side paper records, electronic medical record, and images have been reviewed where available and summarized as:  MRI lumbar spine 01/14/2018: 1. Mild and worsened central narrowing of the thecal sac at L4-5 due to progressive disc bulge along with intervertebral and facet spurring. There is also borderline bilateral foraminal impingement at this level, and mild displacement the left L4 nerve in the lateral extraforaminal space due to the underlying degenerative disc disease. 2. Stable appearance of low-lying conus at the  L2 level. No mass lesion along the minimally fatty filum terminale. 3. No impingement at the fused L5-S1 level.  Lab Results  Component Value Date   HGBA1C 6.0 10/16/2017   Lab Results  Component Value Date   MWUXLKGM01 027 02/25/2015   No results found for: TSH Lab Results  Component Value Date   ESRSEDRATE 6 01/30/2018    Past Medical History:  Diagnosis Date  . Allergic rhinitis due to pollen   . DDD (degenerative disc disease), lumbar   . Essential hypertension, malignant   . Hemorrhoids   . Lumbago   . Plantar fasciitis of left foot   . Pure hypercholesterolemia   . Type II or unspecified type diabetes mellitus without mention of complication, not stated as uncontrolled    dx in 2005  . Unspecified vitamin D deficiency     Past Surgical History:  Procedure Laterality Date  . COLONOSCOPY     in [facility], MD no longer in practice, does not recall the name of facility. Does belive polyps were removed  . LUMBAR DISC SURGERY  2011   total of 3 sx on back per pt  . MAXIMUM ACCESS (MAS)POSTERIOR LUMBAR INTERBODY FUSION (PLIF) 1 LEVEL N/A 06/10/2014   Procedure: Redo Lumbar Five-Sacral One Decompression with maximum access posterior lumbar interbody fusion;  Surgeon: Erline Levine, MD;  Location: [facility];  Service: Neurosurgery;  Laterality: N/A;  Redo Decompression with maximum access posterior lumbar interbody fusion, L5-S1     Medications:  Outpatient Encounter Medications as of 03/21/2018  Medication Sig  . aspirin  81 MG tablet Take 81 mg by mouth daily.  . brompheniramine-pseudoephedrine-DM 30-2-10 MG/5ML syrup TAKE 5 ML BY MOUTH AS NEEDED THREE TIMES DAILY FOR 14 DAYS  . ezetimibe (ZETIA) 10 MG tablet Take 1 tablet (10 mg total) by mouth daily.  Marland Kitchen ibuprofen (ADVIL,MOTRIN) 600 MG tablet TAKE 1 TABLET BY MOUTH THREE TIMES DAILY FOR 30 DAYS  . lisinopril-hydrochlorothiazide (PRINZIDE,ZESTORETIC) 10-12.5 MG tablet Take 1 tablet by mouth once daily  . metoprolol tartrate  (LOPRESSOR) 100 MG tablet Take one tablet by mouth 2 hours prior to CT  . pioglitazone-metformin (ACTOPLUS MET) 15-500 MG tablet TAKE 1 TABLET BY MOUTH TWICE DAILY  . rosuvastatin (CRESTOR) 20 MG tablet TAKE 1 TABLET BY MOUTH ONCE DAILY   No facility-administered encounter medications on file as of 03/21/2018.     Allergies:  Allergies  Allergen Reactions  . Percocet [Oxycodone-Acetaminophen] Other (See Comments)    Caused hallucinations    Family History: Family History  Problem Relation Age of Onset  . Hypertension Mother   . Kidney disease Mother   . Diabetes Mother   . Hypertension Father   . Diabetes Sister   . Cataracts Sister   . Seizures Brother        caused his death  . Colon cancer Neg Hx   . Esophageal cancer Neg Hx   . Rectal cancer Neg Hx   . Stomach cancer Neg Hx   . Heart disease Neg Hx   . Colon polyps Neg Hx     Social History: Social History   Tobacco Use  . Smoking status: Former Smoker    Last attempt to quit: 08/28/1992    Years since quitting: 25.5  . Smokeless tobacco: Never Used  Substance Use Topics  . Alcohol use: Yes    Comment: 6 pack beer lasts a month  . Drug use: Yes    Types: Marijuana   Social History   Social History Narrative   Lives with wife in a one story home.  Has one child.     He is on disability since January 2020, stopped working in February 2019 for low back pain.  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     Education: high school.      Review of Systems:  CONSTITUTIONAL: No fevers, chills, night sweats, or weight loss.   EYES: No visual changes or eye pain ENT: No hearing changes.  No history of nose bleeds.   RESPIRATORY: No cough, wheezing and shortness of breath.   CARDIOVASCULAR: Negative for chest pain, and palpitations.   GI: Negative for abdominal discomfort, blood in stools or black stools.  No recent change in bowel habits.   GU:  No history of incontinence.   MUSCLOSKELETAL: +history of joint pain or swelling.  No  myalgias.   SKIN: Negative for lesions, rash, and itching.   HEMATOLOGY/ONCOLOGY: Negative for prolonged bleeding, bruising easily, and swollen nodes.  No history of cancer.   ENDOCRINE: Negative for cold or heat intolerance, polydipsia or goiter.   PSYCH:  No depression or anxiety symptoms.   NEURO: As Above.   Vital Signs:  BP 120/74   Pulse 69   Temp 98 F (36.7 C) (Oral)   Ht '6\' 3"'$  (1.905 m)   Wt 193 lb 4 oz (87.7 kg)   SpO2 98%   BMI 24.15 kg/m    General Medical Exam:   General:  Well appearing, comfortable.   Eyes/ENT: see cranial nerve examination.   Neck:   No carotid bruits. Respiratory:  Clear to auscultation, good air entry bilaterally.   Cardiac:  Regular rate and rhythm, no murmur.   Extremities:  No deformities, edema, or skin discoloration.  Skin:  No rashes or lesions.  Neurological Exam: MENTAL STATUS including orientation to time, place, person, recent and remote memory, attention span and concentration, language, and fund of knowledge is normal.  Speech is not dysarthric.  CRANIAL NERVES: II:  No visual field defects.  Unremarkable fundi.   III-IV-VI: Pupils equal round and reactive to light.  Normal conjugate, extra-ocular eye movements in all directions of gaze.  No nystagmus.  No ptosis.   V:  Normal facial sensation.    VII:  Normal facial symmetry and movements.   VIII:  Normal hearing and vestibular function.   IX-X:  Normal palatal movement.   XI:  Normal shoulder shrug and head rotation.   XII:  Normal tongue strength and range of motion, no deviation or fasciculation.  MOTOR:  Motor strength cannot be accurately assessed due to diffuse give-way weakness.  All limbs are at least antigravity and able to resist.  No atrophy, fasciculations or abnormal movements.  No pronator drift.   MSRs:  Right        Left                  brachioradialis 2+  2+  biceps 2+  2+  triceps 2+  2+  patellar 2+  2+  ankle jerk 2+  2+  Hoffman no  no  plantar  response down  down   SENSORY:  Normal and symmetric perception of light touch, pinprick, vibration, and proprioception.  Romberg's sign absent.   COORDINATION/GAIT: Normal finger-to- nose-finger.  Nonphysiologic pattern of finger tapping.  Gait is slow, antalgic, unassisted and appears stable.   IMPRESSION: Mr. Bhakta is a 63 year-old man with well-controlled diabetes referred for my opinion on neuropathy. He does have diffuse pain which limits accuracy of his neurological exam.  Sensation and reflexes are preserved distally making neuropathy less likely.  His neurological exam does not support findings of neuropathy, so I will request his NCS/EMG report to review. In the meantime, I will check vitamin B12, folate, vitamin B1, TSH for other causes of paresthesias.  Further recommendations pending results.   Thank you for allowing me to participate in patient's care.  If I can answer any additional questions, I would be pleased to do so.    Sincerely,     K. Posey Pronto, DO

## 2018-03-21 ENCOUNTER — Other Ambulatory Visit (INDEPENDENT_AMBULATORY_CARE_PROVIDER_SITE_OTHER): Payer: BLUE CROSS/BLUE SHIELD

## 2018-03-21 ENCOUNTER — Encounter: Payer: Self-pay | Admitting: Neurology

## 2018-03-21 ENCOUNTER — Encounter

## 2018-03-21 ENCOUNTER — Other Ambulatory Visit: Payer: Self-pay

## 2018-03-21 ENCOUNTER — Ambulatory Visit: Payer: BLUE CROSS/BLUE SHIELD | Admitting: Neurology

## 2018-03-21 VITALS — BP 120/74 | HR 69 | Temp 98.0°F | Ht 75.0 in | Wt 193.2 lb

## 2018-03-21 DIAGNOSIS — R202 Paresthesia of skin: Secondary | ICD-10-CM

## 2018-03-21 DIAGNOSIS — G894 Chronic pain syndrome: Secondary | ICD-10-CM

## 2018-03-21 LAB — FOLATE: Folate: >24 ng/mL (ref >5.9)

## 2018-03-21 LAB — VITAMIN B12: Vitamin B-12: 658 pg/mL (ref 211–911)

## 2018-03-21 LAB — TSH: TSH: 1.5 u[IU]/mL (ref 0.35–4.50)

## 2018-03-21 NOTE — Patient Instructions (Signed)
Check labs  We will request your EMG report and let you know the next

## 2018-03-25 LAB — VITAMIN B1: Vitamin B1 (Thiamine): 27 nmol/L (ref 8–30)

## 2018-03-26 ENCOUNTER — Telehealth: Payer: Self-pay | Admitting: *Deleted

## 2018-03-26 NOTE — Telephone Encounter (Signed)
-----   Message from Alda Berthold, DO sent at 03/26/2018  9:33 AM EDT ----- Please notify patient lab are within normal limits.  Thank you.

## 2018-03-26 NOTE — Telephone Encounter (Signed)
Patient given results but would like to discuss with Dr. Posey Pronto.  Appointment on 04/04/2018 at 10:30 am.

## 2018-03-29 ENCOUNTER — Telehealth: Payer: Self-pay | Admitting: *Deleted

## 2018-03-29 ENCOUNTER — Other Ambulatory Visit: Payer: Self-pay

## 2018-03-29 ENCOUNTER — Encounter: Payer: Self-pay | Admitting: Anesthesiology

## 2018-03-29 ENCOUNTER — Ambulatory Visit: Payer: BLUE CROSS/BLUE SHIELD | Attending: Anesthesiology | Admitting: Anesthesiology

## 2018-03-29 VITALS — BP 112/97 | HR 74 | Temp 98.3°F | Resp 14 | Ht 76.0 in | Wt 196.0 lb

## 2018-03-29 DIAGNOSIS — F1190 Opioid use, unspecified, uncomplicated: Secondary | ICD-10-CM | POA: Diagnosis present

## 2018-03-29 DIAGNOSIS — M5431 Sciatica, right side: Secondary | ICD-10-CM | POA: Insufficient documentation

## 2018-03-29 DIAGNOSIS — M5432 Sciatica, left side: Secondary | ICD-10-CM | POA: Insufficient documentation

## 2018-03-29 DIAGNOSIS — M5136 Other intervertebral disc degeneration, lumbar region: Secondary | ICD-10-CM | POA: Insufficient documentation

## 2018-03-29 DIAGNOSIS — M48062 Spinal stenosis, lumbar region with neurogenic claudication: Secondary | ICD-10-CM | POA: Diagnosis present

## 2018-03-29 DIAGNOSIS — G894 Chronic pain syndrome: Secondary | ICD-10-CM | POA: Insufficient documentation

## 2018-03-29 DIAGNOSIS — M961 Postlaminectomy syndrome, not elsewhere classified: Secondary | ICD-10-CM | POA: Insufficient documentation

## 2018-03-29 MED ORDER — OXYCODONE-ACETAMINOPHEN 10-325 MG PO TABS: 1.0000 | ORAL_TABLET | Freq: Four times a day (QID) | ORAL | 0 refills | Status: AC

## 2018-03-29 NOTE — Telephone Encounter (Signed)
Attempted to call [facility] to verify that scripts are there, unable to make contact because of telephone issues at the store. Contacted patient, he called another [facility] and they verified that the scripts are there.

## 2018-03-29 NOTE — Progress Notes (Signed)
Safety precautions to be maintained throughout the outpatient stay will include: orient to surroundings, keep bed in low position, maintain call bell within reach at all times, provide assistance with transfer out of bed and ambulation.  

## 2018-03-29 NOTE — Progress Notes (Signed)
Subjective:  Patient ID: Cameron Cardenas, male    DOB: [birth year]  Age: 63 y.o. MRN: [id number]  CC: Back Pain (lower)   Procedure: None  HPI Cameron Cardenas presents for reevaluation.  He was last seen a month ago and continues to have severe incapacitating low back pain of the same quality as previously documented on his new patient evaluation.  Otherwise he is in his usual state of health with no new changes.  He still taking his medications as prescribed.  Most effectively he has been on a regimen of oxycodone 10 mg strength taking this 4 times a day and has been on this for an extended period of time.  Unfortunately he has failed more conservative therapy.  We talked about the risks and benefits of this and he understands these.  He reports good relief at approximately 50 to 75% with the medications generally lasting throughout much of the day.  He occasionally has breakthrough pain but the medications help.  No change in lower extremity strength or function is mention at this time.  Outpatient Medications Prior to Visit  Medication Sig Dispense Refill  . aspirin 81 MG tablet Take 81 mg by mouth daily.    Marland Kitchen ezetimibe (ZETIA) 10 MG tablet Take 1 tablet (10 mg total) by mouth daily. 90 tablet 2  . ibuprofen (ADVIL,MOTRIN) 600 MG tablet TAKE 1 TABLET BY MOUTH THREE TIMES DAILY FOR 30 DAYS  2  . lisinopril-hydrochlorothiazide (PRINZIDE,ZESTORETIC) 10-12.5 MG tablet Take 1 tablet by mouth once daily 30 tablet 0  . metoprolol tartrate (LOPRESSOR) 100 MG tablet Take one tablet by mouth 2 hours prior to CT 1 tablet 0  . pioglitazone-metformin (ACTOPLUS MET) 15-500 MG tablet TAKE 1 TABLET BY MOUTH TWICE DAILY 60 tablet 2  . rosuvastatin (CRESTOR) 20 MG tablet TAKE 1 TABLET BY MOUTH ONCE DAILY 30 tablet 5  . brompheniramine-pseudoephedrine-DM 30-2-10 MG/5ML syrup TAKE 5 ML BY MOUTH AS NEEDED THREE TIMES DAILY FOR 14 DAYS     No facility-administered medications prior to visit.     Review of  Systems CNS: No confusion or sedation Cardiac: No angina or palpitations GI: No abdominal pain or constipation Constitutional: No nausea vomiting fevers or chills  Objective:  BP (!) 112/97   Pulse 74   Temp 98.3 F (36.8 C) (Oral)   Resp 14   Ht '6\' 4"'$  (1.93 m)   Wt 196 lb (88.9 kg)   SpO2 100%   BMI 23.86 kg/m    BP Readings from Last 3 Encounters:  03/29/18 (!) 112/97  03/21/18 120/74  03/15/18 108/64     Wt Readings from Last 3 Encounters:  03/29/18 196 lb (88.9 kg)  03/21/18 193 lb 4 oz (87.7 kg)  03/15/18 198 lb (89.8 kg)     Physical Exam Pt is alert and oriented PERRL EOMI HEART IS RRR no murmur or rub LCTA no wheezing or rales MUSCULOSKELETAL deferred  Labs  Lab Results  Component Value Date   HGBA1C 6.0 10/16/2017   HGBA1C 6.1 04/14/2017   HGBA1C 6.2 10/11/2016   Lab Results  Component Value Date   MICROALBUR 0.7 07/12/2016   LDLCALC 59 10/16/2017   CREATININE 1.07 03/19/2018    -------------------------------------------------------------------------------------------------------------------- Lab Results  Component Value Date   WBC 3.5 01/30/2018   HGB 15.0 01/30/2018   HCT 45.4 01/30/2018   PLT 138 (L) 01/30/2018   GLUCOSE 108 (H) 03/19/2018   CHOL 146 10/16/2017   TRIG 41.0 10/16/2017   HDL  79.30 10/16/2017   LDLCALC 59 10/16/2017   ALT 60 (H) 10/16/2017   AST 34 10/16/2017   NA 146 (H) 03/19/2018   K 3.7 03/19/2018   CL 106 03/19/2018   CREATININE 1.07 03/19/2018   BUN 14 03/19/2018   CO2 23 03/19/2018   TSH 1.50 03/21/2018   INR 1.14 07/30/2009   HGBA1C 6.0 10/16/2017   MICROALBUR 0.7 07/12/2016    --------------------------------------------------------------------------------------------------------------------- Ct Head Wo Contrast  Result Date: 02/08/2018 CLINICAL DATA:  63 year old male with headaches for 2-3 months. EXAM: CT HEAD WITHOUT CONTRAST TECHNIQUE: Contiguous axial images were obtained from the base of the  skull through the vertex without intravenous contrast. COMPARISON:  None. FINDINGS: Brain: Cerebral volume is within normal limits for age. No midline shift, ventriculomegaly, mass effect, evidence of mass lesion, intracranial hemorrhage or evidence of cortically based acute infarction. Gray-white matter differentiation is within normal limits throughout the brain. Vascular: Calcified atherosclerosis at the skull base. No suspicious intracranial vascular hyperdensity. Skull: Negative. Sinuses/Orbits: Visualized paranasal sinuses and mastoids are well pneumatized. Other: Postoperative changes to both globes, otherwise negative orbits. Negative visible scalp soft tissues. IMPRESSION: Negative head CT. Normal for age non contrast CT appearance of the brain. Electronically Signed   By: Genevie Ann M.D.   On: 02/08/2018 10:20     Assessment & Plan:   Cameron Cardenas was seen today for back pain.  Diagnoses and all orders for this visit:  Chronic, continuous use of opioids -     ToxASSURE Select 13 (MW), Urine  DDD (degenerative disc disease), lumbar  Bilateral sciatica  Failed back syndrome of lumbar spine  Spinal stenosis of lumbar region with neurogenic claudication  Chronic pain syndrome -     ToxASSURE Select 13 (MW), Urine  Other orders -     oxyCODONE-acetaminophen (PERCOCET) 10-325 MG tablet; Take 1 tablet by mouth 4 (four) times daily for 30 days.        ----------------------------------------------------------------------------------------------------------------------  Problem List Items Addressed This Visit    None    Visit Diagnoses    Chronic, continuous use of opioids    -  Primary   Relevant Orders   ToxASSURE Select 13 (MW), Urine   DDD (degenerative disc disease), lumbar       Relevant Medications   oxyCODONE-acetaminophen (PERCOCET) 10-325 MG tablet   Bilateral sciatica       Failed back syndrome of lumbar spine       Spinal stenosis of lumbar region with neurogenic  claudication       Relevant Medications   oxyCODONE-acetaminophen (PERCOCET) 10-325 MG tablet   Chronic pain syndrome       Relevant Orders   ToxASSURE Select 13 (MW), Urine        ----------------------------------------------------------------------------------------------------------------------  1. DDD (degenerative disc disease), lumbar Continue with core stretching strengthening exercises as previously discussed.  We will schedule him for a possible caudal epidural steroid if indicated in the future.  He has had these in the past and got good relief from them.  Will defer on this at this point.  2. Bilateral sciatica As above  3. Failed back syndrome of lumbar spine Continue with current medication management and we will take over writing for this for his neurosurgeons.  I am going to start him on Percocet 10 mg tablets 1 p.o. 4 times daily with schedule return to clinic in 1 month.  Gone over the rules of the pain clinic for chronic opioid medication management and he has signed a narcotic  contract.  4. Spinal stenosis of lumbar region with neurogenic claudication As above  5. Chronic, continuous use of opioids UDS today. - ToxASSURE Select 13 (MW), Urine  6. Chronic pain syndrome As above - ToxASSURE Select 13 (MW), Urine    ----------------------------------------------------------------------------------------------------------------------  I am having Cameron Cardenas start on oxyCODONE-acetaminophen. I am also having him maintain his aspirin, ezetimibe, ibuprofen, pioglitazone-metformin, rosuvastatin, lisinopril-hydrochlorothiazide, brompheniramine-pseudoephedrine-DM, and metoprolol tartrate.   Meds ordered this encounter  Medications  . oxyCODONE-acetaminophen (PERCOCET) 10-325 MG tablet    Sig: Take 1 tablet by mouth 4 (four) times daily for 30 days.    Dispense:  120 tablet    Refill:  0    30 day supply   Patient's Medications  New Prescriptions    OXYCODONE-ACETAMINOPHEN (PERCOCET) 10-325 MG TABLET    Take 1 tablet by mouth 4 (four) times daily for 30 days.  Previous Medications   ASPIRIN 81 MG TABLET    Take 81 mg by mouth daily.   BROMPHENIRAMINE-PSEUDOEPHEDRINE-DM 30-2-10 MG/5ML SYRUP    TAKE 5 ML BY MOUTH AS NEEDED THREE TIMES DAILY FOR 14 DAYS   EZETIMIBE (ZETIA) 10 MG TABLET    Take 1 tablet (10 mg total) by mouth daily.   IBUPROFEN (ADVIL,MOTRIN) 600 MG TABLET    TAKE 1 TABLET BY MOUTH THREE TIMES DAILY FOR 30 DAYS   LISINOPRIL-HYDROCHLOROTHIAZIDE (PRINZIDE,ZESTORETIC) 10-12.5 MG TABLET    Take 1 tablet by mouth once daily   METOPROLOL TARTRATE (LOPRESSOR) 100 MG TABLET    Take one tablet by mouth 2 hours prior to CT   PIOGLITAZONE-METFORMIN (ACTOPLUS MET) 15-500 MG TABLET    TAKE 1 TABLET BY MOUTH TWICE DAILY   ROSUVASTATIN (CRESTOR) 20 MG TABLET    TAKE 1 TABLET BY MOUTH ONCE DAILY  Modified Medications   No medications on file  Discontinued Medications   No medications on file   ----------------------------------------------------------------------------------------------------------------------  Follow-up: Return in about 1 month (around 04/29/2018) for evaluation, med refill.    Molli Barrows, MD

## 2018-04-04 ENCOUNTER — Telehealth (INDEPENDENT_AMBULATORY_CARE_PROVIDER_SITE_OTHER): Payer: BLUE CROSS/BLUE SHIELD | Admitting: Neurology

## 2018-04-04 ENCOUNTER — Ambulatory Visit: Payer: BLUE CROSS/BLUE SHIELD | Admitting: Neurology

## 2018-04-04 ENCOUNTER — Other Ambulatory Visit: Payer: Self-pay

## 2018-04-04 DIAGNOSIS — G894 Chronic pain syndrome: Secondary | ICD-10-CM | POA: Diagnosis not present

## 2018-04-04 LAB — TOXASSURE SELECT 13 (MW), URINE

## 2018-04-04 NOTE — Progress Notes (Signed)
    Virtual Visit via Telephone Note The purpose of this virtual visit is to provide medical care while limiting exposure to the novel coronavirus.    Consent was obtained for phone visit:  Yes.   Answered questions that patient had about telehealth interaction:  Yes.   I discussed the limitations, risks, security and privacy concerns of performing an evaluation and management service by telephone. I also discussed with the patient that there may be a patient responsible charge related to this service. The patient expressed understanding and agreed to proceed.  Pt location: Home Physician Location: office Name of referring provider:  Scot Jun, FNP I connected with .Cameron Cardenas at patients initiation/request on 04/04/2018 at 10:30 AM EDT by telephone and verified that I am speaking with the correct person using two identifiers.  Pt MRN:  [id number] Pt DOB:  03-Feb-1955   History of Present Illness:  This is a 63 year-old man with well-controlled diabetes mellitus, chronic low back pain s/p lumbar surgery x 3, hypertension, and hyperlipidemia here for follow-up of bilateral leg pain and paresthesias.  He had NCS/EMG of the legs by Dr. Orpah Melter which showed very mild sural nerve prolongation and tibial NCV slowing, amplitudes were borderline-low, but still normal.   His examination with me in the office on 03/21/2018 showed normal distal sensation and reflexes, which did not support peripheral neuropathy.   Assessment and Plan:   Chronic pain syndrome stemming primary from low back pain.  Patient was reassured that he does not have neuropathy.  NCS findings are too mild to explain his widespread leg pain.   Neither his exam or NCS favor neuropathy.  Recommend follow-up with his pain specialist for ongoing management of pain.    Follow Up Instructions: I discussed the assessment and treatment plan with the patient. The patient was provided an opportunity to ask questions and all were  answered. The patient agreed with the plan and demonstrated an understanding of the instructions.   The patient was advised to call back or seek an in-person evaluation if the symptoms worsen or if the condition fails to improve as anticipated.   Total Time spent in visit with the patient was:  6 minutes, of which 100% of the time was spent in counseling and/or coordinating care.   Pt understands and agrees with the plan of care outlined.     Alda Berthold, DO

## 2018-04-11 ENCOUNTER — Ambulatory Visit: Payer: BLUE CROSS/BLUE SHIELD | Admitting: Neurology

## 2018-04-12 ENCOUNTER — Other Ambulatory Visit: Payer: Self-pay | Admitting: Interventional Cardiology

## 2018-04-13 ENCOUNTER — Telehealth: Payer: Self-pay | Admitting: Interventional Cardiology

## 2018-04-13 NOTE — Telephone Encounter (Signed)
New Message             Patient is retuning someone's call name Cameron Cardenas. Patient states he was to come in a take a test for insurance purposes, I could not find any conversations pertaining to this.Pls call to advise.

## 2018-04-25 ENCOUNTER — Other Ambulatory Visit (INDEPENDENT_AMBULATORY_CARE_PROVIDER_SITE_OTHER): Payer: BLUE CROSS/BLUE SHIELD

## 2018-04-25 ENCOUNTER — Other Ambulatory Visit: Payer: Self-pay

## 2018-04-25 DIAGNOSIS — E119 Type 2 diabetes mellitus without complications: Secondary | ICD-10-CM | POA: Diagnosis not present

## 2018-04-25 LAB — BASIC METABOLIC PANEL
BUN: 21 mg/dL (ref 6–23)
CO2: 30 mEq/L (ref 19–32)
Calcium: 9.3 mg/dL (ref 8.4–10.5)
Chloride: 106 mEq/L (ref 96–112)
Creatinine, Ser: 1.23 mg/dL (ref 0.40–1.50)
GFR: 71.74 mL/min (ref >60.00)
Glucose, Bld: 93 mg/dL (ref 70–99)
Potassium: 3.9 mEq/L (ref 3.5–5.1)
Sodium: 142 mEq/L (ref 135–145)

## 2018-04-25 LAB — HEMOGLOBIN A1C: Hgb A1c MFr Bld: 6 % (ref 4.6–6.5)

## 2018-04-26 ENCOUNTER — Ambulatory Visit: Payer: BLUE CROSS/BLUE SHIELD | Attending: Anesthesiology | Admitting: Anesthesiology

## 2018-04-26 ENCOUNTER — Encounter: Payer: Self-pay | Admitting: Anesthesiology

## 2018-04-26 ENCOUNTER — Other Ambulatory Visit: Payer: Self-pay

## 2018-04-26 DIAGNOSIS — M5136 Other intervertebral disc degeneration, lumbar region: Secondary | ICD-10-CM

## 2018-04-26 DIAGNOSIS — M48062 Spinal stenosis, lumbar region with neurogenic claudication: Secondary | ICD-10-CM | POA: Diagnosis not present

## 2018-04-26 DIAGNOSIS — M5431 Sciatica, right side: Secondary | ICD-10-CM

## 2018-04-26 DIAGNOSIS — M5432 Sciatica, left side: Secondary | ICD-10-CM

## 2018-04-26 DIAGNOSIS — M961 Postlaminectomy syndrome, not elsewhere classified: Secondary | ICD-10-CM | POA: Diagnosis not present

## 2018-04-26 DIAGNOSIS — G894 Chronic pain syndrome: Secondary | ICD-10-CM

## 2018-04-26 DIAGNOSIS — F1190 Opioid use, unspecified, uncomplicated: Secondary | ICD-10-CM

## 2018-04-26 NOTE — Progress Notes (Signed)
Virtual Visit via Telephone Note  I connected with Cameron Cardenas on 04/26/18 at  3:30 PM EDT by telephone and verified that I am speaking with the correct person using two identifiers.   I discussed the limitations, risks, security and privacy concerns of performing an evaluation and management service by telephone and the availability of in person appointments. I also discussed with the patient that there may be a patient responsible charge related to this service. The patient expressed understanding and agreed to proceed.   History of Present Illness: I spoke with Cameron Cardenas on the telephone today regarding his current low back situation.  He was scheduled for a return to clinic however secondary to the viral crisis we did this via telephone.  Unfortunately his last urine screen came back positive for [facility] although he held that he had not utilized any marijuana within a month following his initial visit.  He had been placed on Percocet 10 mg tablets 4 times a day at his last visit and he felt that these are effective in helping with his pain but despite these he still does have pain throughout the day.  Otherwise no changes in lower extremity strength or function or the quality characteristic of distribution of his pain are noted.  He denies any diverting or illicit use as well.    Observations/Objective:   Assessment and Plan: 1. DDD (degenerative disc disease), lumbar   2. Bilateral sciatica   3. Failed back syndrome of lumbar spine   4. Spinal stenosis of lumbar region with neurogenic claudication   5. Chronic, continuous use of opioids   6. Chronic pain syndrome   Based on our discussion today I am requesting that he present to clinic for a urine drug screen.  We will await findings on this prior to reinitiating his Percocet.  We have talked about the risks and benefits of opioid administration while utilizing marijuana.  I believe this needs to be negative prior to fulfilling further  prescriptions for opioids for him.  He understands this and desires to comply.  We are scheduling him for return visit in approximately 1 week to 2 weeks and hopefully we will have the urine screen results negative at that time.  In the meantime he is to continue follow-up with his primary care physicians.   Follow Up Instructions:    I discussed the assessment and treatment plan with the patient. The patient was provided an opportunity to ask questions and all were answered. The patient agreed with the plan and demonstrated an understanding of the instructions.   The patient was advised to call back or seek an in-person evaluation if the symptoms worsen or if the condition fails to improve as anticipated.  I provided 20 minutes of non-face-to-face time during this encounter.   Molli Barrows, MD

## 2018-04-26 NOTE — Progress Notes (Signed)
Patient ID: Cameron Cardenas, male   DOB: 07-Oct-1955, 63 y.o.   MRN: [id number]   Reason for Appointment: Endocrinology follow-up    Today's office visit was provided via telemedicine using video technique Explained to the patient and the the limitations of evaluation and management by telemedicine and the availability of in person appointments.  The patient understood the limitations and agreed to proceed. Patient also understood that the telehealth visit is billable. . Location of the patient: Home . Location of the provider: Office Only the patient and myself were participating in the encounter     History of Present Illness   Diagnosis: Type 2 DIABETES MELITUS, date of diagnosis: 2004  PAST history: He had mild diabetes at onset and was treated with metformin and subsequently Actoplusmet In 2013 he had changed his diet significantly and started exercising. This helped him with weight loss Subsequently his blood sugars have been excellent with A1c upper normal  RECENT history:   Oral hypoglycemic drugs: Actoplusmet 15/500 twice a day   He is returning for his 6 month follow-up   His A1c is unchanged at 6%  Current management, problems and blood sugar patterns:  He has about the same level of control with his continue Actoplusmet  As before has not been able to exercise because of his sciatica and back pain  Also may have gained a little weight and is now about 200 pounds  His fasting reading in the lab was 93  However he has not done readings in the mornings and mostly sometime after eating breakfast or dinner  These are excellent  He is usually trying to eat healthy with grilled foods             Side effects from medications: None  Monitors blood glucose every other day: Using meter: One Touch   PRE-MEAL Fasting Lunch Dinner Bedtime Overall  Glucose range:       Mean/median:      128   POST-MEAL PC Breakfast PC Lunch PC Dinner  Glucose range:   118-133  127  124-137  Mean/median:        Physical activity: exercise: Not much walking        Dietician visit: Most recent: 12/13               Wt Readings from Last 3 Encounters:  03/29/18 196 lb (88.9 kg)  03/21/18 193 lb 4 oz (87.7 kg)  03/15/18 198 lb (89.8 kg)   Lab Results  Component Value Date   HGBA1C 6.0 04/25/2018   HGBA1C 6.0 10/16/2017   HGBA1C 6.1 04/14/2017   Lab Results  Component Value Date   MICROALBUR 0.7 07/12/2016   LDLCALC 59 10/16/2017   CREATININE 1.23 04/25/2018    Lab on 04/25/2018  Component Date Value Ref Range Status  . Sodium 04/25/2018 142  135 - 145 mEq/L Final  . Potassium 04/25/2018 3.9  3.5 - 5.1 mEq/L Final  . Chloride 04/25/2018 106  96 - 112 mEq/L Final  . CO2 04/25/2018 30  19 - 32 mEq/L Final  . Glucose, Bld 04/25/2018 93  70 - 99 mg/dL Final  . BUN 04/25/2018 21  6 - 23 mg/dL Final  . Creatinine, Ser 04/25/2018 1.23  0.40 - 1.50 mg/dL Final  . Calcium 04/25/2018 9.3  8.4 - 10.5 mg/dL Final  . GFR 04/25/2018 71.74  >60.00 mL/min Final  . Hgb A1c MFr Bld 04/25/2018 6.0  4.6 - 6.5 % Final  Glycemic Control Guidelines for People with Diabetes:Non Diabetic:  <6%Goal of Therapy: <7%Additional Action Suggested:  >8%      Allergies as of 04/27/2018   No Known Allergies     Medication List       Accurate as of April 26, 2018  9:05 PM. Always use your most recent med list.        aspirin 81 MG tablet Take 81 mg by mouth daily.   brompheniramine-pseudoephedrine-DM 30-2-10 MG/5ML syrup TAKE 5 ML BY MOUTH AS NEEDED THREE TIMES DAILY FOR 14 DAYS   ezetimibe 10 MG tablet Commonly known as:  ZETIA Take 1 tablet (10 mg total) by mouth daily.   ibuprofen 600 MG tablet Commonly known as:  ADVIL TAKE 1 TABLET BY MOUTH THREE TIMES DAILY FOR 30 DAYS   lisinopril-hydrochlorothiazide 10-12.5 MG tablet Commonly known as:  ZESTORETIC Take 1 tablet by mouth once daily   metoprolol tartrate 100 MG tablet Commonly known as:   LOPRESSOR TAKE 1 TABLET BY MOUTH 2 HOURS PRIOR TO CT   oxyCODONE-acetaminophen 10-325 MG tablet Commonly known as:  Percocet Take 1 tablet by mouth 4 (four) times daily for 30 days.   pioglitazone-metformin 15-500 MG tablet Commonly known as:  ACTOPLUS MET TAKE 1 TABLET BY MOUTH TWICE DAILY   rosuvastatin 20 MG tablet Commonly known as:  CRESTOR TAKE 1 TABLET BY MOUTH ONCE DAILY       Allergies:  No Known Allergies  Past Medical History:  Diagnosis Date  . Allergic rhinitis due to pollen   . DDD (degenerative disc disease), lumbar   . Essential hypertension, malignant   . Hemorrhoids   . Lumbago   . Plantar fasciitis of left foot   . Pure hypercholesterolemia   . Type II or unspecified type diabetes mellitus without mention of complication, not stated as uncontrolled    dx in 2005  . Unspecified vitamin D deficiency     Past Surgical History:  Procedure Laterality Date  . COLONOSCOPY     in [facility], MD no longer in practice, does not recall the name of facility. Does belive polyps were removed  . LUMBAR DISC SURGERY  2011   total of 3 sx on back per pt  . MAXIMUM ACCESS (MAS)POSTERIOR LUMBAR INTERBODY FUSION (PLIF) 1 LEVEL N/A 06/10/2014   Procedure: Redo Lumbar Five-Sacral One Decompression with maximum access posterior lumbar interbody fusion;  Surgeon: Erline Levine, MD;  Location: [facility];  Service: Neurosurgery;  Laterality: N/A;  Redo Decompression with maximum access posterior lumbar interbody fusion, L5-S1    Family History  Problem Relation Age of Onset  . Hypertension Mother   . Kidney disease Mother   . Diabetes Mother   . Hypertension Father   . Diabetes Sister   . Cataracts Sister   . Seizures Brother        caused his death  . Colon cancer Neg Hx   . Esophageal cancer Neg Hx   . Rectal cancer Neg Hx   . Stomach cancer Neg Hx   . Heart disease Neg Hx   . Colon polyps Neg Hx     Social History:  reports that he quit smoking about 25 years ago.  He has never used smokeless tobacco. He reports current alcohol use. He reports current drug use. Drug: Marijuana.   Lab on 04/25/2018  Component Date Value Ref Range Status  . Sodium 04/25/2018 142  135 - 145 mEq/L Final  . Potassium 04/25/2018 3.9  3.5 -  5.1 mEq/L Final  . Chloride 04/25/2018 106  96 - 112 mEq/L Final  . CO2 04/25/2018 30  19 - 32 mEq/L Final  . Glucose, Bld 04/25/2018 93  70 - 99 mg/dL Final  . BUN 04/25/2018 21  6 - 23 mg/dL Final  . Creatinine, Ser 04/25/2018 1.23  0.40 - 1.50 mg/dL Final  . Calcium 04/25/2018 9.3  8.4 - 10.5 mg/dL Final  . GFR 04/25/2018 71.74  >60.00 mL/min Final  . Hgb A1c MFr Bld 04/25/2018 6.0  4.6 - 6.5 % Final   Glycemic Control Guidelines for People with Diabetes:Non Diabetic:  <6%Goal of Therapy: <7%Additional Action Suggested:  >8%     Review of Systems:  HYPERTENSION:   Treated with Zestoretic 10/12.5 and followed by PCP  BP Readings from Last 3 Encounters:  03/29/18 (!) 112/97  03/21/18 120/74  03/15/18 108/64     HYPERLIPIDEMIA: The lipid abnormality consists of elevated LDL  He had been on 40 mg atorvastatin since 12/13 and this was increased to 80 mg in 6/16 Previously did have LDL particle number of 1348 previously and which was relatively high at 1216 in 6/16  With Crestor 20 mg and Zetia daily his LDL is excellent Labs have been checked once a year    Lab Results  Component Value Date   CHOL 146 10/16/2017   HDL 79.30 10/16/2017   LDLCALC 59 10/16/2017   TRIG 41.0 10/16/2017   CHOLHDL 2 10/16/2017    He is currently in pain management for his back pain and sciatica    He had a foot exam in 10/19    Examination:   There were no vitals taken for this visit.  There is no height or weight on file to calculate BMI.      ASSESSMENT/ PLAN:   Diabetes type 2 nonobese  He has had mild diabetes with consistently good A1c levels of 6% now  Continues to have good control of Actoplusmet 15/500 twice a day   He says he is fairly consistent with his diet and eating grilled portion but is gaining a little weight Not able to exercise currently and discussed trying to do chair exercises  He says he wants to try the freestyle libre instead of fingersticks and will send the prescription, discussed that he can use his smart phone also to check his sugar  HYPERLIPIDEMIA with increased LDL particle number in the past Will have follow-up on the next visit and continue same regimen of Crestor and Zetia   Hypertension: ls controlled with Zestoretic but no recent readings available from PCP  Follow-up in 6 months  There are no Patient Instructions on file for this visit.  Cameron Cardenas 04/26/2018, 9:05 PM     Note: This office note was prepared with Dragon voice recognition system technology. Any transcriptional errors that result from this process are unintentional.

## 2018-04-27 ENCOUNTER — Ambulatory Visit (INDEPENDENT_AMBULATORY_CARE_PROVIDER_SITE_OTHER): Payer: BLUE CROSS/BLUE SHIELD | Admitting: Endocrinology

## 2018-04-27 ENCOUNTER — Other Ambulatory Visit: Payer: Self-pay

## 2018-04-27 DIAGNOSIS — E119 Type 2 diabetes mellitus without complications: Secondary | ICD-10-CM

## 2018-04-27 DIAGNOSIS — E785 Hyperlipidemia, unspecified: Secondary | ICD-10-CM

## 2018-04-27 MED ORDER — FREESTYLE LIBRE 14 DAY SENSOR MISC: 1.0000 [IU] | 4 refills | Status: DC

## 2018-04-27 MED ORDER — FREESTYLE LIBRE 14 DAY READER DEVI: 1.0000 | Freq: Once | 0 refills | Status: AC

## 2018-05-01 ENCOUNTER — Ambulatory Visit: Payer: BLUE CROSS/BLUE SHIELD | Admitting: Family Medicine

## 2018-05-03 ENCOUNTER — Telehealth: Payer: Self-pay | Admitting: *Deleted

## 2018-05-03 NOTE — Telephone Encounter (Signed)
Urine was done on 04-30-18.  Results are not back yet.  Patient notified.

## 2018-05-04 LAB — TOXASSURE SELECT 13 (MW), URINE

## 2018-05-07 ENCOUNTER — Telehealth: Payer: Self-pay | Admitting: Anesthesiology

## 2018-05-07 NOTE — Telephone Encounter (Signed)
Patient is calling about results of UDS so he can get his meds sent in. Dr. Andree Elk told patient he would check so he could send refill.

## 2018-05-07 NOTE — Telephone Encounter (Signed)
Spoke with DR Diona Foley results.  Results reviewed.  To schedule for 1 month f/up and then Dr Andree Elk will reconsider whether prescribing will resume.

## 2018-05-08 ENCOUNTER — Ambulatory Visit: Payer: BLUE CROSS/BLUE SHIELD | Admitting: Interventional Cardiology

## 2018-05-08 NOTE — Telephone Encounter (Signed)
Patient is scheduled 05-09-17.  Patient called at 8:10 am Tue. Very upset because he was not getting any medications sent to pharmacy. I informed him that Dr. Andree Elk would talk with him on thurs about any medications if he was going to prescribe them.

## 2018-05-10 ENCOUNTER — Other Ambulatory Visit: Payer: Self-pay

## 2018-05-10 ENCOUNTER — Encounter: Payer: Self-pay | Admitting: Anesthesiology

## 2018-05-10 ENCOUNTER — Ambulatory Visit: Payer: BLUE CROSS/BLUE SHIELD | Attending: Anesthesiology | Admitting: Anesthesiology

## 2018-05-10 DIAGNOSIS — M48062 Spinal stenosis, lumbar region with neurogenic claudication: Secondary | ICD-10-CM | POA: Diagnosis not present

## 2018-05-10 DIAGNOSIS — M961 Postlaminectomy syndrome, not elsewhere classified: Secondary | ICD-10-CM | POA: Diagnosis not present

## 2018-05-10 DIAGNOSIS — F1190 Opioid use, unspecified, uncomplicated: Secondary | ICD-10-CM

## 2018-05-10 DIAGNOSIS — M5136 Other intervertebral disc degeneration, lumbar region: Secondary | ICD-10-CM

## 2018-05-10 DIAGNOSIS — M5431 Sciatica, right side: Secondary | ICD-10-CM | POA: Diagnosis not present

## 2018-05-10 DIAGNOSIS — M5432 Sciatica, left side: Secondary | ICD-10-CM

## 2018-05-10 DIAGNOSIS — G894 Chronic pain syndrome: Secondary | ICD-10-CM

## 2018-05-10 MED ORDER — OXYCODONE-ACETAMINOPHEN 10-325 MG PO TABS: 1.0000 | ORAL_TABLET | Freq: Four times a day (QID) | ORAL | 0 refills | Status: DC | PRN

## 2018-05-10 NOTE — Patient Instructions (Signed)
Subjective

## 2018-05-10 NOTE — Progress Notes (Signed)
Subjective:  Patient ID: Cameron Cardenas, male    DOB: June 11, 1955  Age: 63 y.o. MRN: [id number]  CC: No chief complaint on file.   Procedure: None  HPI Cameron Cardenas presents for evaluation for video conferencing.  He recently reported for his urine drug screen and this was negative for marijuana as previously requested.  The quality characteristic and distribution of his pain have remained stable in nature with no changes from our last evaluation.  This primarily affects his low back and radiates to the legs as before.  His bowel and bladder function has been stable and he reports good relief with the preceding regimen of Percocet 10 mg tablets 4 times a day.  In the past he has gotten good relief with this with no side effects reported.  Otherwise he is in his usual state of health at this time.  Outpatient Medications Prior to Visit  Medication Sig Dispense Refill  . aspirin 81 MG tablet Take 81 mg by mouth daily.    . brompheniramine-pseudoephedrine-DM 30-2-10 MG/5ML syrup TAKE 5 ML BY MOUTH AS NEEDED THREE TIMES DAILY FOR 14 DAYS    . Continuous Blood Gluc Sensor (FREESTYLE LIBRE 14 DAY SENSOR) MISC 1 Units by Does not apply route every 14 (fourteen) days. 2 each 4  . ezetimibe (ZETIA) 10 MG tablet Take 1 tablet (10 mg total) by mouth daily. 90 tablet 2  . ibuprofen (ADVIL,MOTRIN) 600 MG tablet TAKE 1 TABLET BY MOUTH THREE TIMES DAILY FOR 30 DAYS  2  . lisinopril-hydrochlorothiazide (PRINZIDE,ZESTORETIC) 10-12.5 MG tablet Take 1 tablet by mouth once daily 30 tablet 0  . metoprolol tartrate (LOPRESSOR) 100 MG tablet TAKE 1 TABLET BY MOUTH 2 HOURS PRIOR TO CT 1 tablet 0  . pioglitazone-metformin (ACTOPLUS MET) 15-500 MG tablet TAKE 1 TABLET BY MOUTH TWICE DAILY 60 tablet 2  . rosuvastatin (CRESTOR) 20 MG tablet TAKE 1 TABLET BY MOUTH ONCE DAILY 30 tablet 5   No facility-administered medications prior to visit.     Review of Systems CNS: No confusion or sedation Cardiac: No angina  or palpitations GI: No abdominal pain or constipation Constitutional: No nausea vomiting fevers or chills  Objective:  There were no vitals taken for this visit.   BP Readings from Last 3 Encounters:  03/29/18 (!) 112/97  03/21/18 120/74  03/15/18 108/64     Wt Readings from Last 3 Encounters:  03/29/18 196 lb (88.9 kg)  03/21/18 193 lb 4 oz (87.7 kg)  03/15/18 198 lb (89.8 kg)     Physical Exam Pt is alert and oriented   Labs  Lab Results  Component Value Date   HGBA1C 6.0 04/25/2018   HGBA1C 6.0 10/16/2017   HGBA1C 6.1 04/14/2017   Lab Results  Component Value Date   MICROALBUR 0.7 07/12/2016   LDLCALC 59 10/16/2017   CREATININE 1.23 04/25/2018    -------------------------------------------------------------------------------------------------------------------- Lab Results  Component Value Date   WBC 3.5 01/30/2018   HGB 15.0 01/30/2018   HCT 45.4 01/30/2018   PLT 138 (L) 01/30/2018   GLUCOSE 93 04/25/2018   CHOL 146 10/16/2017   TRIG 41.0 10/16/2017   HDL 79.30 10/16/2017   LDLCALC 59 10/16/2017   ALT 60 (H) 10/16/2017   AST 34 10/16/2017   NA 142 04/25/2018   K 3.9 04/25/2018   CL 106 04/25/2018   CREATININE 1.23 04/25/2018   BUN 21 04/25/2018   CO2 30 04/25/2018   TSH 1.50 03/21/2018   INR 1.14 07/30/2009  HGBA1C 6.0 04/25/2018   MICROALBUR 0.7 07/12/2016    --------------------------------------------------------------------------------------------------------------------- Ct Head Wo Contrast  Result Date: 02/08/2018 CLINICAL DATA:  63 year old male with headaches for 2-3 months. EXAM: CT HEAD WITHOUT CONTRAST TECHNIQUE: Contiguous axial images were obtained from the base of the skull through the vertex without intravenous contrast. COMPARISON:  None. FINDINGS: Brain: Cerebral volume is within normal limits for age. No midline shift, ventriculomegaly, mass effect, evidence of mass lesion, intracranial hemorrhage or evidence of cortically  based acute infarction. Gray-white matter differentiation is within normal limits throughout the brain. Vascular: Calcified atherosclerosis at the skull base. No suspicious intracranial vascular hyperdensity. Skull: Negative. Sinuses/Orbits: Visualized paranasal sinuses and mastoids are well pneumatized. Other: Postoperative changes to both globes, otherwise negative orbits. Negative visible scalp soft tissues. IMPRESSION: Negative head CT. Normal for age non contrast CT appearance of the brain. Electronically Signed   By: Genevie Ann M.D.   On: 02/08/2018 10:20     Assessment & Plan:   Diagnoses and all orders for this visit:  Bilateral sciatica  Failed back syndrome of lumbar spine  DDD (degenerative disc disease), lumbar  Spinal stenosis of lumbar region with neurogenic claudication  Chronic, continuous use of opioids  Chronic pain syndrome  Other orders -     oxyCODONE-acetaminophen (PERCOCET) 10-325 MG tablet; Take 1 tablet by mouth every 6 (six) hours as needed for up to 30 days for pain.        ----------------------------------------------------------------------------------------------------------------------  Problem List Items Addressed This Visit    None    Visit Diagnoses    Bilateral sciatica    -  Primary   Failed back syndrome of lumbar spine       DDD (degenerative disc disease), lumbar       Relevant Medications   oxyCODONE-acetaminophen (PERCOCET) 10-325 MG tablet   Spinal stenosis of lumbar region with neurogenic claudication       Relevant Medications   oxyCODONE-acetaminophen (PERCOCET) 10-325 MG tablet   Chronic, continuous use of opioids       Chronic pain syndrome       Relevant Medications   oxyCODONE-acetaminophen (PERCOCET) 10-325 MG tablet        ----------------------------------------------------------------------------------------------------------------------  1. Bilateral sciatica We will reinitiate the Percocet 10 mg tablets 4 times  a day for April 30.  I have reviewed the [facility] practitioner database information and it is appropriate.  We will schedule him for return to clinic in 1 month for reevaluation.   2. Failed back syndrome of lumbar spine Continue back stretching strengthening exercises as reviewed and continue follow-up with his primary care physicians.  3. DDD (degenerative disc disease), lumbar   4. Spinal stenosis of lumbar region with neurogenic claudication   5. Chronic, continuous use of opioids As above  6. Chronic pain syndrome     ----------------------------------------------------------------------------------------------------------------------  I am having Cameron Cardenas start on oxyCODONE-acetaminophen. I am also having him maintain his aspirin, ezetimibe, ibuprofen, pioglitazone-metformin, rosuvastatin, lisinopril-hydrochlorothiazide, brompheniramine-pseudoephedrine-DM, metoprolol tartrate, and FreeStyle Libre 14 Day Sensor.   Meds ordered this encounter  Medications  . oxyCODONE-acetaminophen (PERCOCET) 10-325 MG tablet    Sig: Take 1 tablet by mouth every 6 (six) hours as needed for up to 30 days for pain.    Dispense:  1 tablet    Refill:  0    30 day supply no early refills   Patient's Medications  New Prescriptions   OXYCODONE-ACETAMINOPHEN (PERCOCET) 10-325 MG TABLET    Take 1 tablet by mouth every  6 (six) hours as needed for up to 30 days for pain.  Previous Medications   ASPIRIN 81 MG TABLET    Take 81 mg by mouth daily.   BROMPHENIRAMINE-PSEUDOEPHEDRINE-DM 30-2-10 MG/5ML SYRUP    TAKE 5 ML BY MOUTH AS NEEDED THREE TIMES DAILY FOR 14 DAYS   CONTINUOUS BLOOD GLUC SENSOR (FREESTYLE LIBRE 14 DAY SENSOR) MISC    1 Units by Does not apply route every 14 (fourteen) days.   EZETIMIBE (ZETIA) 10 MG TABLET    Take 1 tablet (10 mg total) by mouth daily.   IBUPROFEN (ADVIL,MOTRIN) 600 MG TABLET    TAKE 1 TABLET BY MOUTH THREE TIMES DAILY FOR 30 DAYS    LISINOPRIL-HYDROCHLOROTHIAZIDE (PRINZIDE,ZESTORETIC) 10-12.5 MG TABLET    Take 1 tablet by mouth once daily   METOPROLOL TARTRATE (LOPRESSOR) 100 MG TABLET    TAKE 1 TABLET BY MOUTH 2 HOURS PRIOR TO CT   PIOGLITAZONE-METFORMIN (ACTOPLUS MET) 15-500 MG TABLET    TAKE 1 TABLET BY MOUTH TWICE DAILY   ROSUVASTATIN (CRESTOR) 20 MG TABLET    TAKE 1 TABLET BY MOUTH ONCE DAILY  Modified Medications   No medications on file  Discontinued Medications   No medications on file   ----------------------------------------------------------------------------------------------------------------------  Follow-up: No follow-ups on file.  Return to clinic in 1 month for reevaluation.  Molli Barrows, MD

## 2018-05-12 ENCOUNTER — Other Ambulatory Visit: Payer: Self-pay | Admitting: Endocrinology

## 2018-05-14 ENCOUNTER — Telehealth: Payer: Self-pay

## 2018-05-14 NOTE — Telephone Encounter (Signed)
Spoke with [facility] on [address] and they state they did not get the prescrition dated 05-10-2018 even thought it says "receipt confirmed by pharmacy." Message sent to Dr Andree Elk and he was also told verbally.

## 2018-05-14 NOTE — Telephone Encounter (Signed)
Returned patient call and left messge for him to call the office.  I looked into the chart and saw that the oxycodone had been sent to this pharmacy on 05-10-2018.

## 2018-05-14 NOTE — Telephone Encounter (Signed)
Dr. Andree Elk forgot to send his script in for percocet to [place]. He said he did this last time too.

## 2018-05-15 MED ORDER — OXYCODONE-ACETAMINOPHEN 10-325 MG PO TABS: 1.0000 | ORAL_TABLET | Freq: Four times a day (QID) | ORAL | 0 refills | Status: DC | PRN

## 2018-05-15 NOTE — Addendum Note (Signed)
Addended by: Molli Barrows on: 05/15/2018 07:17 AM   Modules accepted: Orders

## 2018-05-17 ENCOUNTER — Telehealth: Payer: Self-pay | Admitting: Interventional Cardiology

## 2018-05-17 DIAGNOSIS — R079 Chest pain, unspecified: Secondary | ICD-10-CM

## 2018-05-17 NOTE — Telephone Encounter (Signed)
° ° °  Pt c/o of Chest Pain: STAT if CP now or developed within 24 hours  1. Are you having CP right now? Pain in the middle of chest, "feels like heartburn"  2. Are you experiencing any other symptoms (ex. SOB, nausea, vomiting, sweating)? Chest sore  3. How long have you been experiencing CP? 3 days  4. Is your CP continuous or coming and going? Coming and going  5. Have you taken Nitroglycerin? no ?

## 2018-05-17 NOTE — Telephone Encounter (Signed)
The patient needs a stress Myoview or Lexiscan Myoview scheduled within the next 7 days.  If persistent chest discomfort go to the emergency room.

## 2018-05-17 NOTE — Telephone Encounter (Signed)
Left detailed message for patient to call back tomorrow, but to go to the ED if he has persistant chest discomfort.

## 2018-05-17 NOTE — Telephone Encounter (Signed)
Called patient back about chest pain. Patient complaining of pain in the middle of his chest when he moves around and puts his arms over his head. Patient stated if feels like he has heartburn and he needs to belch. Patient stated at times he is SOB, but not often. Patient stated it has been coming and going for 3 days, and it's not the same kind of chest pain he had at his last visit with Dr. Tamala Julian. Patient denies any other symptoms. Will forward to Dr. Tamala Julian and his nurse for advisement.

## 2018-05-18 NOTE — Telephone Encounter (Signed)
I spoke to the patient and informed him of Dr Thompson Caul recommendation for a Myoview Lexiscan Stress Test.  I told him that this would be done, hopefully a day next week.  He should receive a call to schedule.  I also informed him to go to the ED if symptoms worsen.  He verbalized understanding.

## 2018-05-18 NOTE — Telephone Encounter (Signed)
F/U Message          Patient is returning Pam's call and would like a call back.

## 2018-05-22 ENCOUNTER — Telehealth (HOSPITAL_COMMUNITY): Payer: Self-pay

## 2018-05-22 NOTE — Telephone Encounter (Signed)
Spoke with the patient and he stated that he would be here for his test. Asked to call back with any questions. S.Linc Renne EMTP

## 2018-05-24 ENCOUNTER — Ambulatory Visit (HOSPITAL_COMMUNITY): Payer: BLUE CROSS/BLUE SHIELD

## 2018-05-24 ENCOUNTER — Other Ambulatory Visit: Payer: Self-pay

## 2018-05-24 ENCOUNTER — Telehealth: Payer: Self-pay | Admitting: Interventional Cardiology

## 2018-05-24 NOTE — Telephone Encounter (Signed)
New Message     Pt is wondering if the Metoprolol is what he is suppose to take before his myocardial perfusion     Please call

## 2018-05-24 NOTE — Telephone Encounter (Signed)
I spoke to the patient and informed him to take his Metoprolol 100 mg prior to his Stress Test.  He verbalized understanding.

## 2018-05-25 ENCOUNTER — Telehealth (HOSPITAL_COMMUNITY): Payer: Self-pay | Admitting: *Deleted

## 2018-05-25 NOTE — Telephone Encounter (Signed)
Patient given detailed instructions per Myocardial Perfusion Study Information Sheet for the test on 05/29/18 at 7:15. Patient notified to arrive 15 minutes early and that it is imperative to arrive on time for appointment to keep from having the test rescheduled.  If you need to cancel or reschedule your appointment, please call the office within 24 hours of your appointment. . Patient verbalized understanding.Cameron Cardenas

## 2018-05-29 ENCOUNTER — Ambulatory Visit (HOSPITAL_COMMUNITY): Payer: BLUE CROSS/BLUE SHIELD | Attending: Cardiology

## 2018-05-29 ENCOUNTER — Other Ambulatory Visit: Payer: Self-pay

## 2018-05-29 VITALS — Ht 76.0 in | Wt 196.0 lb

## 2018-05-29 DIAGNOSIS — R002 Palpitations: Secondary | ICD-10-CM | POA: Diagnosis present

## 2018-05-29 DIAGNOSIS — R0789 Other chest pain: Secondary | ICD-10-CM

## 2018-05-29 DIAGNOSIS — I10 Essential (primary) hypertension: Secondary | ICD-10-CM | POA: Insufficient documentation

## 2018-05-29 LAB — MYOCARDIAL PERFUSION IMAGING
LV dias vol: 124 mL (ref 62–150)
LV sys vol: 52 mL
Peak HR: 95 {beats}/min
Rest HR: 54 {beats}/min
SDS: 0
SRS: 0
SSS: 2
TID: 0.97

## 2018-05-29 MED ORDER — TECHNETIUM TC 99M TETROFOSMIN IV KIT
32.4000 | PACK | Freq: Once | INTRAVENOUS | Status: AC | PRN
  Administered 2018-05-29: 32.4 via INTRAVENOUS
  Filled 2018-05-29: qty 33

## 2018-05-29 MED ORDER — TECHNETIUM TC 99M TETROFOSMIN IV KIT
10.2000 | PACK | Freq: Once | INTRAVENOUS | Status: AC | PRN
  Administered 2018-05-29: 10.2 via INTRAVENOUS
  Filled 2018-05-29: qty 11

## 2018-05-29 MED ORDER — REGADENOSON 0.4 MG/5ML IV SOLN
0.4000 mg | Freq: Once | INTRAVENOUS | Status: AC
  Administered 2018-05-29: 09:00:00 0.4 mg via INTRAVENOUS

## 2018-05-31 LAB — HM DIABETES EYE EXAM

## 2018-06-05 ENCOUNTER — Telehealth: Payer: Self-pay

## 2018-06-05 ENCOUNTER — Other Ambulatory Visit: Payer: Self-pay

## 2018-06-05 ENCOUNTER — Ambulatory Visit: Payer: BLUE CROSS/BLUE SHIELD | Attending: Anesthesiology | Admitting: Anesthesiology

## 2018-06-05 ENCOUNTER — Encounter: Payer: Self-pay | Admitting: Anesthesiology

## 2018-06-05 DIAGNOSIS — M48062 Spinal stenosis, lumbar region with neurogenic claudication: Secondary | ICD-10-CM | POA: Diagnosis not present

## 2018-06-05 DIAGNOSIS — M5136 Other intervertebral disc degeneration, lumbar region: Secondary | ICD-10-CM | POA: Diagnosis not present

## 2018-06-05 DIAGNOSIS — M5431 Sciatica, right side: Secondary | ICD-10-CM

## 2018-06-05 DIAGNOSIS — M961 Postlaminectomy syndrome, not elsewhere classified: Secondary | ICD-10-CM

## 2018-06-05 DIAGNOSIS — M5432 Sciatica, left side: Secondary | ICD-10-CM

## 2018-06-05 DIAGNOSIS — M51369 Other intervertebral disc degeneration, lumbar region without mention of lumbar back pain or lower extremity pain: Secondary | ICD-10-CM

## 2018-06-05 DIAGNOSIS — F1190 Opioid use, unspecified, uncomplicated: Secondary | ICD-10-CM

## 2018-06-05 DIAGNOSIS — G894 Chronic pain syndrome: Secondary | ICD-10-CM

## 2018-06-05 MED ORDER — GABAPENTIN 300 MG PO CAPS: 300.0000 mg | ORAL_CAPSULE | Freq: Every day | ORAL | 1 refills | Status: DC

## 2018-06-05 MED ORDER — OXYCODONE-ACETAMINOPHEN 10-325 MG PO TABS: 1.0000 | ORAL_TABLET | Freq: Four times a day (QID) | ORAL | 0 refills | Status: DC | PRN

## 2018-06-05 NOTE — Progress Notes (Signed)
Virtual Visit via Video Note   This visit type was conducted due to national recommendations for restrictions regarding the COVID-19 Pandemic (e.g. social distancing) in an effort to limit this patient's exposure and mitigate transmission in our community.  Due to his co-morbid illnesses, this patient is at least at moderate risk for complications without adequate follow up.  This format is felt to be most appropriate for this patient at this time.  All issues noted in this document were discussed and addressed.  A limited physical exam was performed with this format.  Please refer to the patient's chart for his consent to telehealth for [facility].   Date:  06/05/2018   ID:  Cameron Cardenas, DOB 1955/04/14, MRN [id number]  Patient Location: Home Provider Location: Office  PCP:  Merrilee Seashore, MD  Cardiologist:  Sinclair Grooms, MD  Electrophysiologist:  None   Evaluation Performed:  Follow-Up Visit  Chief Complaint:  Chest pain  History of Present Illness:    Cameron Cardenas is a 63 y.o. male with  a hx of hyperlipidemia, type 2 diabetes mellitus, essential hypertension, and abnormal EKG with bifascicular block returning for clinical follow-up.  He is doing well.  He did not have a significant episode of racing heart when he wore the 30-day monitor.  The monitor really did not demonstrate any significant abnormality.  He has not had any chest pain of any significance.  He denies dyspnea.  His nuclear study was low risk.  We discussed the findings and implications.  The patient does not have symptoms concerning for COVID-19 infection (fever, chills, cough, or new shortness of breath).    Past Medical History:  Diagnosis Date  . Allergic rhinitis due to pollen   . DDD (degenerative disc disease), lumbar   . Essential hypertension, malignant   . Hemorrhoids   . Lumbago   . Plantar fasciitis of left foot   . Pure hypercholesterolemia   . Type II or unspecified type  diabetes mellitus without mention of complication, not stated as uncontrolled    dx in 2005  . Unspecified vitamin D deficiency    Past Surgical History:  Procedure Laterality Date  . COLONOSCOPY     in [facility], MD no longer in practice, does not recall the name of facility. Does belive polyps were removed  . LUMBAR DISC SURGERY  2011   total of 3 sx on back per pt  . MAXIMUM ACCESS (MAS)POSTERIOR LUMBAR INTERBODY FUSION (PLIF) 1 LEVEL N/A 06/10/2014   Procedure: Redo Lumbar Five-Sacral One Decompression with maximum access posterior lumbar interbody fusion;  Surgeon: Erline Levine, MD;  Location: [facility];  Service: Neurosurgery;  Laterality: N/A;  Redo Decompression with maximum access posterior lumbar interbody fusion, L5-S1     No outpatient medications have been marked as taking for the 06/06/18 encounter (Appointment) with Belva Crome, MD.     Allergies:   Patient has no known allergies.   Social History   Tobacco Use  . Smoking status: Former Smoker    Last attempt to quit: 08/28/1992    Years since quitting: 25.7  . Smokeless tobacco: Never Used  Substance Use Topics  . Alcohol use: Yes    Comment: 6 pack beer lasts a month  . Drug use: Yes    Types: Marijuana     Family Hx: The patient's family history includes Cataracts in his sister; Diabetes in his mother and sister; Hypertension in his father and mother; Kidney disease  in his mother; Seizures in his brother. There is no history of Colon cancer, Esophageal cancer, Rectal cancer, Stomach cancer, Heart disease, or Colon polyps.  ROS:   Please see the history of present illness.    Still has some anxiety about his overall health.  He is sleeping well.  He denies snoring. All other systems reviewed and are negative.   Prior CV studies:   The following studies were reviewed today : CARDIAC EVENT MONITOR 03/2018: Study Highlights    Underlying rhythm is normal sinus rhythm  Normal heart rate range  No  significant arrhythmias. Specifically no AV block, atrial fibrillation, or VT.  Occasional PAC  Chest pain does not correlate with rhythm      STRESS NUCLEAR 05/2018: Study Highlights     The left ventricular ejection fraction is normal (55-65%).  Nuclear stress EF: 58%.  There was no ST segment deviation noted during stress.  The study is normal.  This is a low risk study.   This is a normal Lexiscan nuclear stress test with no evidence for a prior infarct or ischemia. LVEF 58%.      Labs/Other Tests and Data Reviewed:    EKG:  No ECG reviewed.  Recent Labs: 10/16/2017: ALT 60 01/30/2018: Hemoglobin 15.0; Platelets 138 03/21/2018: TSH 1.50 04/25/2018: BUN 21; Creatinine, Ser 1.23; Potassium 3.9; Sodium 142   Recent Lipid Panel Lab Results  Component Value Date/Time   CHOL 146 10/16/2017 08:15 AM   TRIG 41.0 10/16/2017 08:15 AM   HDL 79.30 10/16/2017 08:15 AM   CHOLHDL 2 10/16/2017 08:15 AM   LDLCALC 59 10/16/2017 08:15 AM    Wt Readings from Last 3 Encounters:  05/29/18 196 lb (88.9 kg)  03/29/18 196 lb (88.9 kg)  03/21/18 193 lb 4 oz (87.7 kg)     Objective:    Vital Signs:  There were no vitals taken for this visit.   VITAL SIGNS:  reviewed GEN:  Morbidly obese RESPIRATORY:  normal respiratory effort, symmetric expansion CARDIOVASCULAR:  no peripheral edema NEURO:  alert and oriented x 3, no obvious focal deficit  ASSESSMENT & PLAN:    1. Chest pain of uncertain etiology   2. Essential hypertension   3. Type 2 diabetes mellitus with complication, without long-term current use of insulin ([place])   4. Hyperlipidemia with target LDL less than 100   5. Bifascicular bundle branch block   6. ED (erectile dysfunction) of organic origin   7. Educated About Covid-19 Virus Infection    Plan:  1. Symptoms atypical, this goes along with the finding of a low risk myocardial perfusion study.  Will concentrate on primary prevention. 2. Target 130/80 mmHg  3. A1c less than 7 4. LDL target less than 70 5. Not addressed 6. Will improve with lipid management  Overall education and awareness concerning primary risk prevention was discussed in detail: LDL less than 70, hemoglobin A1c less than 7, blood pressure target less than 130/80 mmHg, >150 minutes of moderate aerobic activity per week, avoidance of smoking, weight control (via diet and exercise), and continued surveillance/management of/for obstructive sleep apnea.   Will gather recent labs and determine if any further risk mitigation would be helpful.He is at risk for developing diastolic/systolic heart failure.  Would consider an SGLT2 agent to give added protection    COVID-19 Education: The signs and symptoms of COVID-19 were discussed with the patient and how to seek care for testing (follow up with PCP or arrange E-visit).  The importance  of social distancing was discussed today.  Time:   Today, I have spent 15 minutes with the patient with telehealth technology discussing the above problems.     Medication Adjustments/Labs and Tests Ordered: Current medicines are reviewed at length with the patient today.  Concerns regarding medicines are outlined above.   Tests Ordered: No orders of the defined types were placed in this encounter.   Medication Changes: No orders of the defined types were placed in this encounter.   Disposition:  Follow up in 5 month(s)  Signed, Sinclair Grooms, MD  06/05/2018 5:43 PM    [place]

## 2018-06-05 NOTE — Telephone Encounter (Signed)
LM for pt to call back so I could go over his instructions for his Virtual visit with Dr Tamala Julian tomorrow. Also, need to document consent.

## 2018-06-05 NOTE — Telephone Encounter (Signed)
YOUR CARDIOLOGY TEAM HAS ARRANGED FOR AN E-VISIT FOR YOUR APPOINTMENT - PLEASE REVIEW IMPORTANT INFORMATION BELOW SEVERAL DAYS PRIOR TO YOUR APPOINTMENT  Due to the recent COVID-19 pandemic, we are transitioning in-person office visits to tele-medicine visits in an effort to decrease unnecessary exposure to our patients, their families, and staff. These visits are billed to your insurance just like a normal visit is. We also encourage you to sign up for MyChart if you have not already done so. You will need a smartphone if possible. For patients that do not have this, we can still complete the visit using a regular telephone but do prefer a smartphone to enable video when possible. You may have a family member that lives with you that can help. If possible, we also ask that you have a blood pressure cuff and scale at home to measure your blood pressure, heart rate and weight prior to your scheduled appointment. Patients with clinical needs that need an in-person evaluation and testing will still be able to come to the office if absolutely necessary. If you have any questions, feel free to call our office.     YOUR PROVIDER WILL BE USING THE FOLLOWING PLATFORM TO COMPLETE YOUR VISIT: Doximity  . IF USING MYCHART - How to Download the MyChart App to Your SmartPhone   - If Apple, go to App Store and type in MyChart in the search bar and download the app. If Android, ask patient to go to Google Play Store and type in MyChart in the search bar and download the app. The app is free but as with any other app downloads, your phone may require you to verify saved payment information or Apple/Android password.  - You will need to then log into the app with your MyChart username and password, and select [facility] as your healthcare provider to link the account.  - When it is time for your visit, go to the MyChart app, find appointments, and click Begin Video Visit. Be sure to Select Allow for your device to  access the Microphone and Camera for your visit. You will then be connected, and your provider will be with you shortly.  **If you have any issues connecting or need assistance, please contact MyChart service desk [phone] ([phone])**  **If using a computer, in order to ensure the best quality for your visit, you will need to use either of the following Internet Browsers: Google Chrome or Microsoft Edge**  . IF USING DOXIMITY or [email or web address].ME - The staff will give you instructions on receiving your link to join the meeting the day of your visit.      2-3 DAYS BEFORE YOUR APPOINTMENT  You will receive a telephone call from one of our Heart[facility] team members - your caller ID may say "Unknown caller." If this is a video visit, we will walk you through how to get the video launched on your phone. We will remind you check your blood pressure, heart rate and weight prior to your scheduled appointment. If you have an Apple Watch or Kardia, please upload any pertinent ECG strips the day before or morning of your appointment to MyChart. Our staff will also make sure you have reviewed the consent and agree to move forward with your scheduled tele-health visit.     THE DAY OF YOUR APPOINTMENT  Approximately 15 minutes prior to your scheduled appointment, you will receive a telephone call from one of HeartCare team - your caller ID may say "Unknown caller."    Our staff will confirm medications, vital signs for the day and any symptoms you may be experiencing. Please have this information available prior to the time of visit start. It may also be helpful for you to have a pad of paper and pen handy for any instructions given during your visit. They will also walk you through joining the smartphone meeting if this is a video visit.    CONSENT FOR TELE-HEALTH VISIT - PLEASE REVIEW  I hereby voluntarily request, consent and authorize [facility] and its employed or contracted physicians, physician  assistants, nurse practitioners or other licensed health care professionals (the Practitioner), to provide me with telemedicine health care services (the "Services") as deemed necessary by the treating Practitioner. I acknowledge and consent to receive the Services by the Practitioner via telemedicine. I understand that the telemedicine visit will involve communicating with the Practitioner through live audiovisual communication technology and the disclosure of certain medical information by electronic transmission. I acknowledge that I have been given the opportunity to request an in-person assessment or other available alternative prior to the telemedicine visit and am voluntarily participating in the telemedicine visit.  I understand that I have the right to withhold or withdraw my consent to the use of telemedicine in the course of my care at any time, without affecting my right to future care or treatment, and that the Practitioner or I may terminate the telemedicine visit at any time. I understand that I have the right to inspect all information obtained and/or recorded in the course of the telemedicine visit and may receive copies of available information for a reasonable fee.  I understand that some of the potential risks of receiving the Services via telemedicine include:  . Delay or interruption in medical evaluation due to technological equipment failure or disruption; . Information transmitted may not be sufficient (e.g. poor resolution of images) to allow for appropriate medical decision making by the Practitioner; and/or  . In rare instances, security protocols could fail, causing a breach of personal health information.  Furthermore, I acknowledge that it is my responsibility to provide information about my medical history, conditions and care that is complete and accurate to the best of my ability. I acknowledge that Practitioner's advice, recommendations, and/or decision may be based on  factors not within their control, such as incomplete or inaccurate data provided by me or distortions of diagnostic images or specimens that may result from electronic transmissions. I understand that the practice of medicine is not an exact science and that Practitioner makes no warranties or guarantees regarding treatment outcomes. I acknowledge that I will receive a copy of this consent concurrently upon execution via email to the email address I last provided but may also request a printed copy by calling the office of [facility].    I understand that my insurance will be billed for this visit.   I have read or had this consent read to me. . I understand the contents of this consent, which adequately explains the benefits and risks of the Services being provided via telemedicine.  . I have been provided ample opportunity to ask questions regarding this consent and the Services and have had my questions answered to my satisfaction. . I give my informed consent for the services to be provided through the use of telemedicine in my medical care  By participating in this telemedicine visit I agree to the above.  

## 2018-06-06 ENCOUNTER — Telehealth (INDEPENDENT_AMBULATORY_CARE_PROVIDER_SITE_OTHER): Payer: BLUE CROSS/BLUE SHIELD | Admitting: Interventional Cardiology

## 2018-06-06 ENCOUNTER — Other Ambulatory Visit: Payer: Self-pay

## 2018-06-06 ENCOUNTER — Encounter: Payer: Self-pay | Admitting: Interventional Cardiology

## 2018-06-06 VITALS — Ht 76.0 in | Wt 201.0 lb

## 2018-06-06 DIAGNOSIS — I10 Essential (primary) hypertension: Secondary | ICD-10-CM

## 2018-06-06 DIAGNOSIS — E118 Type 2 diabetes mellitus with unspecified complications: Secondary | ICD-10-CM

## 2018-06-06 DIAGNOSIS — E785 Hyperlipidemia, unspecified: Secondary | ICD-10-CM

## 2018-06-06 DIAGNOSIS — N529 Male erectile dysfunction, unspecified: Secondary | ICD-10-CM

## 2018-06-06 DIAGNOSIS — Z7189 Other specified counseling: Secondary | ICD-10-CM

## 2018-06-06 DIAGNOSIS — R079 Chest pain, unspecified: Secondary | ICD-10-CM

## 2018-06-06 DIAGNOSIS — I452 Bifascicular block: Secondary | ICD-10-CM

## 2018-06-06 DIAGNOSIS — R0789 Other chest pain: Secondary | ICD-10-CM | POA: Diagnosis not present

## 2018-06-06 NOTE — Patient Instructions (Addendum)
Medication Instructions:  Your physician recommends that you continue on your current medications as directed. Please refer to the Current Medication list given to you today.  If you need a refill on your cardiac medications before your next appointment, please call your pharmacy.   Lab work: None If you have labs (blood work) drawn today and your tests are completely normal, you will receive your results only by: Marland Kitchen MyChart Message (if you have MyChart) OR . A paper copy in the mail If you have any lab test that is abnormal or we need to change your treatment, we will call you to review the results.  Testing/Procedures: None  Follow-Up: At [facility], you and your health needs are our priority.  As part of our continuing mission to provide you with exceptional heart care, we have created designated Provider Care Teams.  These Care Teams include your primary Cardiologist (physician) and Advanced Practice Providers (APPs -  Physician Assistants and Nurse Practitioners) who all work together to provide you with the care you need, when you need it. You will need a follow up appointment in 6-8 months.  Please call our office 2 months in advance to schedule this appointment.  You may see Sinclair Grooms, MD or one of the following Advanced Practice Providers on your designated Care Team:   Truitt Merle, NP Cecilie Kicks, NP . Kathyrn Drown, NP  Any Other Special Instructions Will Be Listed Below (If Applicable).  Please contact the office sooner if you have prolonged palpitations or increased occurrences.

## 2018-06-06 NOTE — Progress Notes (Addendum)
Virtual Visit via Video Note  I connected with Cameron Cardenas on 06/06/18 at  9:00 AM EDT by a video enabled telemedicine application and verified that I am speaking with the correct person using two identifiers.  Location: Patient: Home Provider: Pain control center   I discussed the limitations of evaluation and management by telemedicine and the availability of in person appointments. The patient expressed understanding and agreed to proceed.  History of Present Illness: I had a video conference with Cameron Cardenas today regarding his low back pain and chronic pain syndrome.  He has been doing well with his current pain management regimen and his medications are continuing to give him good relief.  However he is having some intermittent breakthrough pain with radiating pain into the calves on both legs.  This is despite his previous back surgery x3 and previous interventional procedures.  At this point he is considered a non-candidate for further interventional therapy as he feels that this has not worked well for him.  He is also been through physical therapy with minimal relief and conventional more conservative medications have not given him good relief.  He states that the quality characteristic distribution of his pain is been stable in nature.  He is tolerating his opioid medications without difficulty.  These are working well.  He last had his medications filled May 5 and he is due for a refill.  From our discussion today he denies any side effects or diverting or illicit use and is getting good relief with the medicines.    Observations/Objective:  Current Outpatient Medications:  .  aspirin 81 MG tablet, Take 81 mg by mouth daily., Disp: , Rfl:  .  brompheniramine-pseudoephedrine-DM 30-2-10 MG/5ML syrup, TAKE 5 ML BY MOUTH AS NEEDED THREE TIMES DAILY FOR 14 DAYS, Disp: , Rfl:  .  Continuous Blood Gluc Sensor (FREESTYLE LIBRE 14 DAY SENSOR) MISC, 1 Units by Does not apply route every  14 (fourteen) days., Disp: 2 each, Rfl: 4 .  ezetimibe (ZETIA) 10 MG tablet, Take 1 tablet by mouth once daily, Disp: 90 tablet, Rfl: 0 .  gabapentin (NEURONTIN) 300 MG capsule, Take 1 capsule (300 mg total) by mouth at bedtime for 30 days., Disp: 60 capsule, Rfl: 1 .  ibuprofen (ADVIL,MOTRIN) 600 MG tablet, TAKE 1 TABLET BY MOUTH THREE TIMES DAILY FOR 30 DAYS, Disp: , Rfl: 2 .  lisinopril-hydrochlorothiazide (ZESTORETIC) 10-12.5 MG tablet, Take 1 tablet by mouth once daily, Disp: 30 tablet, Rfl: 0 .  metoprolol tartrate (LOPRESSOR) 100 MG tablet, TAKE 1 TABLET BY MOUTH 2 HOURS PRIOR TO CT, Disp: 1 tablet, Rfl: 0 .  [START ON 06/14/2018] oxyCODONE-acetaminophen (PERCOCET) 10-325 MG tablet, Take 1 tablet by mouth every 6 (six) hours as needed for up to 30 days for pain., Disp: 120 tablet, Rfl: 0 .  pioglitazone-metformin (ACTOPLUS MET) 15-500 MG tablet, Take 1 tablet by mouth twice daily, Disp: 60 tablet, Rfl: 0 .  rosuvastatin (CRESTOR) 20 MG tablet, TAKE 1 TABLET BY MOUTH ONCE DAILY, Disp: 30 tablet, Rfl: 5   Assessment and Plan: 1. Bilateral sciatica   2. Failed back syndrome of lumbar spine   3. DDD (degenerative disc disease), lumbar   4. Spinal stenosis of lumbar region with neurogenic claudication   5. Chronic, continuous use of opioids   6. Chronic pain syndrome   Based on our discussion today and after review of the [facility] practitioner database information I am going to refill his medications for June for with return  to clinic in 1 month.  I want to start him on Neurontin 1 pill at bedtime to see if this can help with some of the neuralgic type pain he is experiencing and gone over the risks and benefits of this medication and his opioids with him.  He is instructed to contact us should he have any more problems with his pain management and continue follow-up with his primary care physicians for his baseline medical care.  Follow Up Instructions: Return to clinic in 1 month    I  discussed the assessment and treatment plan with the patient. The patient was provided an opportunity to ask questions and all were answered. The patient agreed with the plan and demonstrated an understanding of the instructions.   The patient was advised to call back or seek an in-person evaluation if the symptoms worsen or if the condition fails to improve as anticipated.  I provided 30 minutes of non-face-to-face time during this encounter.   Molli Barrows, MD

## 2018-06-14 ENCOUNTER — Other Ambulatory Visit: Payer: Self-pay | Admitting: Endocrinology

## 2018-06-26 ENCOUNTER — Telehealth: Payer: Self-pay | Admitting: Interventional Cardiology

## 2018-06-26 NOTE — Telephone Encounter (Signed)
Pt was contacted about scheduling Coronary CT.

## 2018-06-26 NOTE — Telephone Encounter (Signed)
New message:   Patient states that some one called him. I did not see a note. Please call patient back.

## 2018-06-28 ENCOUNTER — Telehealth: Payer: Self-pay | Admitting: Interventional Cardiology

## 2018-06-28 NOTE — Telephone Encounter (Signed)
New Message    Pt c/o of Chest Pain: STAT if CP now or developed within 24 hours  1. Are you having CP right now? NO  2. Are you experiencing any other symptoms (ex. SOB, nausea, vomiting, sweating)? No  3. How long have you been experiencing CP? For about three days  4. Is your CP continuous or coming and going? Coming and going   5. Have you taken Nitroglycerin? No ?

## 2018-06-28 NOTE — Telephone Encounter (Signed)
The patient stated that for the past 3 days he has had chest pain that occurs over his right breast. He denied any lifestyle changes. He stated it comes and goes probably about 5 or so times a day. The pain is a 7/10. He said it occurs for about an hour or two. The pain occurs at random and does not have to be when he ambulates. After further discussion, he said that is seems to occur mostly after he eats. The pain does not usually occur before he eats. He stated he has never been treated for acid reflux. He denied SOB or any dizziness. He said the pain is similar to his chest pain in the past.

## 2018-06-28 NOTE — Telephone Encounter (Signed)
Probably not cardiac. Keep [name] posted. Talk to PCP.

## 2018-06-29 NOTE — Telephone Encounter (Signed)
Left detailed message with recommendations.  Ok per [employer].  Advised to call back if any questions.

## 2018-07-02 ENCOUNTER — Telehealth: Payer: Self-pay | Admitting: *Deleted

## 2018-07-02 ENCOUNTER — Other Ambulatory Visit: Payer: BLUE CROSS/BLUE SHIELD

## 2018-07-02 NOTE — Telephone Encounter (Signed)
    COVID-19 Pre-Screening Questions:  . In the past 7 to 10 days have you had a cough,  shortness of breath, headache, congestion, fever (100 or greater) body aches, chills, sore throat, or sudden loss of taste or sense of smell? . Have you been around anyone with known Covid 19. . Have you been around anyone who is awaiting Covid 19 test results in the past 7 to 10 days? . Have you been around anyone who has been exposed to Covid 19, or has mentioned symptoms of Covid 19 within the past 7 to 10 days?  If you have any concerns/questions about symptoms patients report during screening (either on the phone or at threshold). Contact the provider seeing the patient or DOD for further guidance.  If neither are available contact a member of the leadership team.          Contacted patient via phone call all Covid 19 questions were answered no. Has a mask.KB  

## 2018-07-03 ENCOUNTER — Other Ambulatory Visit: Payer: Self-pay

## 2018-07-03 ENCOUNTER — Other Ambulatory Visit: Payer: Self-pay | Admitting: *Deleted

## 2018-07-03 ENCOUNTER — Other Ambulatory Visit: Payer: BLUE CROSS/BLUE SHIELD

## 2018-07-03 DIAGNOSIS — Z01818 Encounter for other preprocedural examination: Secondary | ICD-10-CM

## 2018-07-03 LAB — BASIC METABOLIC PANEL
BUN/Creatinine Ratio: 14 (ref 10–24)
BUN: 17 mg/dL (ref 8–27)
CO2: 27 mmol/L (ref 20–29)
Calcium: 9.7 mg/dL (ref 8.6–10.2)
Chloride: 104 mmol/L (ref 96–106)
Creatinine, Ser: 1.23 mg/dL (ref 0.76–1.27)
GFR calc Af Amer: 72 mL/min/{1.73_m2} (ref >59)
GFR calc non Af Amer: 62 mL/min/{1.73_m2} (ref >59)
Glucose: 98 mg/dL (ref 65–99)
Potassium: 4.2 mmol/L (ref 3.5–5.2)
Sodium: 144 mmol/L (ref 134–144)

## 2018-07-09 ENCOUNTER — Encounter: Payer: Self-pay | Admitting: Anesthesiology

## 2018-07-09 ENCOUNTER — Telehealth (HOSPITAL_COMMUNITY): Payer: Self-pay | Admitting: Emergency Medicine

## 2018-07-09 ENCOUNTER — Ambulatory Visit: Payer: BLUE CROSS/BLUE SHIELD | Attending: Anesthesiology | Admitting: Anesthesiology

## 2018-07-09 ENCOUNTER — Other Ambulatory Visit: Payer: Self-pay

## 2018-07-09 DIAGNOSIS — G894 Chronic pain syndrome: Secondary | ICD-10-CM | POA: Diagnosis not present

## 2018-07-09 DIAGNOSIS — M5431 Sciatica, right side: Secondary | ICD-10-CM | POA: Insufficient documentation

## 2018-07-09 DIAGNOSIS — M5126 Other intervertebral disc displacement, lumbar region: Secondary | ICD-10-CM

## 2018-07-09 DIAGNOSIS — M48062 Spinal stenosis, lumbar region with neurogenic claudication: Secondary | ICD-10-CM

## 2018-07-09 DIAGNOSIS — M961 Postlaminectomy syndrome, not elsewhere classified: Secondary | ICD-10-CM

## 2018-07-09 DIAGNOSIS — M48061 Spinal stenosis, lumbar region without neurogenic claudication: Secondary | ICD-10-CM

## 2018-07-09 DIAGNOSIS — F1190 Opioid use, unspecified, uncomplicated: Secondary | ICD-10-CM | POA: Diagnosis not present

## 2018-07-09 DIAGNOSIS — M5432 Sciatica, left side: Secondary | ICD-10-CM

## 2018-07-09 HISTORY — DX: Sciatica, right side: M54.31

## 2018-07-09 HISTORY — DX: Chronic pain syndrome: G89.4

## 2018-07-09 HISTORY — DX: Spinal stenosis, lumbar region without neurogenic claudication: M48.061

## 2018-07-09 HISTORY — DX: Opioid use, unspecified, uncomplicated: F11.90

## 2018-07-09 HISTORY — DX: Postlaminectomy syndrome, not elsewhere classified: M96.1

## 2018-07-09 MED ORDER — OXYCODONE-ACETAMINOPHEN 10-325 MG PO TABS: 1.0000 | ORAL_TABLET | Freq: Four times a day (QID) | ORAL | 0 refills | Status: DC | PRN

## 2018-07-09 NOTE — Progress Notes (Signed)
Virtual Visit via Telephone Note  I connected with [name] on 07/09/18 at  3:00 PM EDT by telephone and verified that I am speaking with the correct person using two identifiers.  Location: Patient: Home Provider: Pain control center   I discussed the limitations, risks, security and privacy concerns of performing an evaluation and management service by telephone and the availability of in person appointments. I also discussed with the patient that there may be a patient responsible charge related to this service. The patient expressed understanding and agreed to proceed.   History of Present Illness: I spoke with Mr. Carsten via telephone conferencing in regards to his low back pain.  Status of his chronic pain in the low back and legs is stable in nature.  Has been taking his medications as prescribed and these are continuing to work well.  No changes in the nature of his strength or bowel or bladder function is noted.  No side effects with the medications are reported and he continues to get good functional lifestyle improvement with his medicines.    Observations/Objective:  Current Outpatient Medications:  .  aspirin 81 MG tablet, Take 81 mg by mouth daily., Disp: , Rfl:  .  Continuous Blood Gluc Sensor (FREESTYLE LIBRE 14 DAY SENSOR) MISC, 1 Units by Does not apply route every 14 (fourteen) days., Disp: 2 each, Rfl: 4 .  ezetimibe (ZETIA) 10 MG tablet, Take 1 tablet by mouth once daily, Disp: 90 tablet, Rfl: 0 .  fluticasone (FLONASE) 50 MCG/ACT nasal spray, USE 2 SPRAY(S) IN EACH NOSTRIL ONCE DAILY, Disp: , Rfl:  .  gabapentin (NEURONTIN) 300 MG capsule, Take 1 capsule (300 mg total) by mouth at bedtime for 30 days., Disp: 60 capsule, Rfl: 1 .  ipratropium (ATROVENT) 0.06 % nasal spray, USE 2 SPRAY(S) IN EACH NOSTRIL THREE TIMES DAILY FOR 21 DAYS, Disp: , Rfl:  .  levocetirizine (XYZAL) 5 MG tablet, Take 5 mg by mouth daily., Disp: , Rfl:  .  lisinopril-hydrochlorothiazide  (ZESTORETIC) 10-12.5 MG tablet, Take 1 tablet by mouth once daily, Disp: 30 tablet, Rfl: 0 .  [START ON 07/14/2018] oxyCODONE-acetaminophen (PERCOCET) 10-325 MG tablet, Take 1 tablet by mouth every 6 (six) hours as needed for up to 30 days for pain., Disp: 120 tablet, Rfl: 0 .  pioglitazone-metformin (ACTOPLUS MET) 15-500 MG tablet, Take 1 tablet by mouth twice daily, Disp: 60 tablet, Rfl: 0 .  prednisoLONE acetate (PRED FORTE) 1 % ophthalmic suspension, INSTILL 1 DROP INTO RIGHT EYE 4 TIMES DAILY FOR 7 DAYS, Disp: , Rfl:  .  rosuvastatin (CRESTOR) 20 MG tablet, TAKE 1 TABLET BY MOUTH ONCE DAILY, Disp: 30 tablet, Rfl: 5 .  triamcinolone cream (KENALOG) 0.5 %, APPLY CREAM TOPICALLY ONCE DAILY, Disp: , Rfl:    Assessment and Plan: 1. Herniated lumbar intervertebral disc   2. Chronic, continuous use of opioids   3. Chronic pain syndrome   4. Spinal stenosis of lumbar region with neurogenic claudication   5. Lumbar post-laminectomy syndrome   6. Bilateral sciatica   Based on the above and upon review of the [facility] practitioner database information I will refill his medications for July 4.  We will schedule him for 30-day return.  He is instructed to contact the pain control center for any problems in the meantime and continue follow up with his primary care physicians for his baseline medical care.   Follow Up Instructions:    I discussed the assessment and treatment plan with  the patient. The patient was provided an opportunity to ask questions and all were answered. The patient agreed with the plan and demonstrated an understanding of the instructions.   The patient was advised to call back or seek an in-person evaluation if the symptoms worsen or if the condition fails to improve as anticipated.  I provided 30 minutes of non-face-to-face time during this encounter.   Molli Barrows, MD

## 2018-07-09 NOTE — Telephone Encounter (Signed)
Reaching out to patient to offer assistance regarding upcoming cardiac imaging study; pt verbalizes understanding of appt date/time, parking situation and where to check in, pre-test NPO status and medications ordered, and verified current allergies; name and call back number provided for further questions should they arise Ravynn Hogate RN Navigator Cardiac Imaging [facility] [phone] office [phone] cell  Pt denies covid symptoms, verbalized understanding of visitor policy. 

## 2018-07-10 ENCOUNTER — Ambulatory Visit (HOSPITAL_COMMUNITY)
Admission: RE | Admit: 2018-07-10 | Discharge: 2018-07-10 | Disposition: A | Discharge status: Home / Self Care | Payer: BLUE CROSS/BLUE SHIELD | Source: Ambulatory Visit | Attending: Interventional Cardiology | Admitting: Interventional Cardiology

## 2018-07-10 ENCOUNTER — Encounter: Payer: BLUE CROSS/BLUE SHIELD | Admitting: *Deleted

## 2018-07-10 ENCOUNTER — Encounter (HOSPITAL_COMMUNITY): Payer: Self-pay

## 2018-07-10 ENCOUNTER — Other Ambulatory Visit: Payer: Self-pay

## 2018-07-10 ENCOUNTER — Ambulatory Visit (HOSPITAL_COMMUNITY): Admission: RE | Admit: 2018-07-10 | Payer: BLUE CROSS/BLUE SHIELD | Source: Ambulatory Visit

## 2018-07-10 DIAGNOSIS — R0789 Other chest pain: Secondary | ICD-10-CM | POA: Diagnosis present

## 2018-07-10 DIAGNOSIS — Z006 Encounter for examination for normal comparison and control in clinical research program: Secondary | ICD-10-CM

## 2018-07-10 DIAGNOSIS — R079 Chest pain, unspecified: Secondary | ICD-10-CM

## 2018-07-10 MED ORDER — NITROGLYCERIN 0.4 MG SL SUBL
SUBLINGUAL_TABLET | SUBLINGUAL | Status: AC
  Filled 2018-07-10: qty 2

## 2018-07-10 MED ORDER — IOHEXOL 350 MG/ML SOLN
100.0000 mL | Freq: Once | INTRAVENOUS | Status: AC | PRN
  Administered 2018-07-10: 100 mL via INTRAVENOUS

## 2018-07-10 MED ORDER — NITROGLYCERIN 0.4 MG SL SUBL
0.8000 mg | SUBLINGUAL_TABLET | Freq: Once | SUBLINGUAL | Status: AC
  Administered 2018-07-10: 0.8 mg via SUBLINGUAL

## 2018-07-10 NOTE — Progress Notes (Signed)
Pt tolerated exam without incident.  PIV removed and dressing applied.  Discharge instructions discussed.  Pt discharged

## 2018-07-10 NOTE — Research (Signed)
CADFEM Informed Consent                  Subject Name:   Cameron Cardenas   Subject met inclusion and exclusion criteria.  The informed consent form, study requirements and expectations were reviewed with the subject and questions and concerns were addressed prior to the signing of the consent form.  The subject verbalized understanding of the trial requirements.  The subject agreed to participate in the CADFEM trial and signed the informed consent.  The informed consent was obtained prior to performance of any protocol-specific procedures for the subject.  A copy of the signed informed consent was given to the subject and a copy was placed in the subject's medical record.   [place] Maury Bamba, Research Assistant 07/10/2018 10:41 a.m.

## 2018-07-14 ENCOUNTER — Other Ambulatory Visit: Payer: Self-pay | Admitting: Endocrinology

## 2018-07-16 ENCOUNTER — Telehealth: Payer: Self-pay | Admitting: Interventional Cardiology

## 2018-07-16 NOTE — Telephone Encounter (Signed)
Patient returning call for results 

## 2018-07-16 NOTE — Telephone Encounter (Signed)
Spoke with pt and went over Coronary CT results.  Pt verbalized understanding.

## 2018-08-02 ENCOUNTER — Other Ambulatory Visit: Payer: Self-pay

## 2018-08-02 ENCOUNTER — Encounter: Payer: Self-pay | Admitting: Anesthesiology

## 2018-08-02 ENCOUNTER — Ambulatory Visit: Payer: BLUE CROSS/BLUE SHIELD | Attending: Anesthesiology | Admitting: Anesthesiology

## 2018-08-02 DIAGNOSIS — G894 Chronic pain syndrome: Secondary | ICD-10-CM

## 2018-08-02 DIAGNOSIS — M5431 Sciatica, right side: Secondary | ICD-10-CM

## 2018-08-02 DIAGNOSIS — M5126 Other intervertebral disc displacement, lumbar region: Secondary | ICD-10-CM | POA: Diagnosis not present

## 2018-08-02 DIAGNOSIS — M48062 Spinal stenosis, lumbar region with neurogenic claudication: Secondary | ICD-10-CM | POA: Diagnosis not present

## 2018-08-02 DIAGNOSIS — Z20822 Contact with and (suspected) exposure to covid-19: Secondary | ICD-10-CM

## 2018-08-02 DIAGNOSIS — M5136 Other intervertebral disc degeneration, lumbar region: Secondary | ICD-10-CM

## 2018-08-02 DIAGNOSIS — M5432 Sciatica, left side: Secondary | ICD-10-CM

## 2018-08-02 DIAGNOSIS — M961 Postlaminectomy syndrome, not elsewhere classified: Secondary | ICD-10-CM

## 2018-08-02 DIAGNOSIS — F1190 Opioid use, unspecified, uncomplicated: Secondary | ICD-10-CM | POA: Diagnosis not present

## 2018-08-02 DIAGNOSIS — M51369 Other intervertebral disc degeneration, lumbar region without mention of lumbar back pain or lower extremity pain: Secondary | ICD-10-CM

## 2018-08-02 MED ORDER — OXYCODONE HCL 10 MG PO TABS: 10.0000 mg | ORAL_TABLET | Freq: Four times a day (QID) | ORAL | 0 refills | Status: DC

## 2018-08-02 MED ORDER — OXYCODONE-ACETAMINOPHEN 10-325 MG PO TABS: 1.0000 | ORAL_TABLET | Freq: Four times a day (QID) | ORAL | 0 refills | Status: AC | PRN

## 2018-08-02 NOTE — Progress Notes (Signed)
Virtual Visit via Video Note  I connected with Cameron Cardenas on 08/02/18 at  2:00 PM EDT by a video enabled telemedicine application and verified that I am speaking with the correct person using two identifiers.  Location: Patient: Home Provider: Pain control center   I discussed the limitations of evaluation and management by telemedicine and the availability of in person appointments. The patient expressed understanding and agreed to proceed.  History of Present Illness: I spoke with Cameron Cardenas via video voice conferencing secondary to the [place] crisis.  He is at home doing well.  No significant changes are noted.  He still having significant low back pain with radiation into his legs.  He tries to walk for exercise but this does not help much.  Stretching occasionally gives him some relief but he is also found no relief with injection therapy and the most effective thing for him right now are the opioids.  He takes oxycodone 10 mg tablets 4 times a day and gets good relief with this and sleeps better.  No side effects are reported and he is doing well with this regimen.  Generally keeps his pain under moderate to reasonable control.  Otherwise he is in his usual state of health.    Observations/Objective: Current Outpatient Medications:  .  aspirin 81 MG tablet, Take 81 mg by mouth daily., Disp: , Rfl:  .  Continuous Blood Gluc Sensor (FREESTYLE LIBRE 14 DAY SENSOR) MISC, 1 Units by Does not apply route every 14 (fourteen) days., Disp: 2 each, Rfl: 4 .  ezetimibe (ZETIA) 10 MG tablet, Take 1 tablet by mouth once daily, Disp: 90 tablet, Rfl: 0 .  fluticasone (FLONASE) 50 MCG/ACT nasal spray, USE 2 SPRAY(S) IN EACH NOSTRIL ONCE DAILY, Disp: , Rfl:  .  gabapentin (NEURONTIN) 300 MG capsule, Take 1 capsule (300 mg total) by mouth at bedtime for 30 days., Disp: 60 capsule, Rfl: 1 .  ipratropium (ATROVENT) 0.06 % nasal spray, USE 2 SPRAY(S) IN EACH NOSTRIL THREE TIMES DAILY FOR 21 DAYS, Disp:  , Rfl:  .  levocetirizine (XYZAL) 5 MG tablet, Take 5 mg by mouth daily., Disp: , Rfl:  .  lisinopril-hydrochlorothiazide (ZESTORETIC) 10-12.5 MG tablet, Take 1 tablet by mouth once daily, Disp: 30 tablet, Rfl: 0 .  oxyCODONE-acetaminophen (PERCOCET) 10-325 MG tablet, Take 1 tablet by mouth every 6 (six) hours as needed for up to 30 days for pain., Disp: 120 tablet, Rfl: 0 .  pioglitazone-metformin (ACTOPLUS MET) 15-500 MG tablet, Take 1 tablet by mouth twice daily, Disp: 60 tablet, Rfl: 0 .  prednisoLONE acetate (PRED FORTE) 1 % ophthalmic suspension, INSTILL 1 DROP INTO RIGHT EYE 4 TIMES DAILY FOR 7 DAYS, Disp: , Rfl:  .  rosuvastatin (CRESTOR) 20 MG tablet, TAKE 1 TABLET BY MOUTH ONCE DAILY, Disp: 30 tablet, Rfl: 5 .  triamcinolone cream (KENALOG) 0.5 %, APPLY CREAM TOPICALLY ONCE DAILY, Disp: , Rfl:    Assessment and Plan: 1. Herniated lumbar intervertebral disc   2. Chronic, continuous use of opioids   3. Chronic pain syndrome   4. Spinal stenosis of lumbar region with neurogenic claudication   5. Lumbar post-laminectomy syndrome   6. Bilateral sciatica   7. Failed back syndrome of lumbar spine   8. DDD (degenerative disc disease), lumbar   Based on our discussion today and upon review of the [facility] practitioner database information I am going to refill his medications.  This will be dated for August 3 and September 2.  Schedule him for 56-monthreturn to clinic.  If he should have any problems with his pain in the meantime he is instructed to [place].  He is also to continue follow-up with his primary care physicians for his baseline medical care.   Follow Up Instructions:    I discussed the assessment and treatment plan with the patient. The patient was provided an opportunity to ask questions and all were answered. The patient agreed with the plan and demonstrated an understanding of the instructions.   The patient was advised to call back or  seek an in-person evaluation if the symptoms worsen or if the condition fails to improve as anticipated.  I provided 30 minutes of non-face-to-face time during this encounter.   [name] MD

## 2018-08-05 LAB — NOVEL CORONAVIRUS, NAA: SARS-CoV-2, NAA: NOT DETECTED

## 2018-08-13 ENCOUNTER — Telehealth: Payer: Self-pay | Admitting: Interventional Cardiology

## 2018-08-13 NOTE — Telephone Encounter (Signed)
Pt called to report that on Saturday 08/11/18 he was in the barber chair and he was "dosing" off and then the barber noticed he was not easily woken up and appeared he had passed out. They got him some water and he walked around with some dizziness but felt better and went home.. he reports that his chest has been "sore"... it hurts with breathing and movement... he denies any further dizziness, sob, headache. He did eat well that morning prior to his hair cut. He denies cough, fever, sob.   Pt says he "knows it is heart"... he had stress test 05/2018... he called his PCP.. Dr. Ashby Dawes and was advised to see Cardiology.   I made the pt and appt for Friday... 08/17/18 with Daune Perch NP... a day Dr. Tamala Julian is here as DOD... pt declined a sooner appt..   Pt denies any COVID sx and says he has been tested "quite a few times" and has been negaive.... he is not staying at home but wears a mask in public. Pt to call if he develops any sx prior to his appt. And to avoid any one that has been sick.   Pt will call EMS if he has any presyncope... and I advised him that he should not be driving until seen by an MD of he had a true syncopal episode. He verbalized understanding and agreed.

## 2018-08-13 NOTE — Telephone Encounter (Signed)
New message   Pt c/o of Chest Pain: STAT if CP now or developed within 24 hours  1. Are you having CP right now? Patient had chest pain on Saturday 08/11/2018, no   2. Are you experiencing any other symptoms (ex. SOB, nausea, vomiting, sweating)? Patient passed out, chest is sore, sob   3. How long have you been experiencing CP? Since 08/11/2018   4. Is your CP continuous or coming and going? Continuous   5. Have you taken Nitroglycerin? No  ?

## 2018-08-13 NOTE — Telephone Encounter (Signed)
Lm for the pt... asked him to consider calling EMS but if not to please call our office ASAP and to ask for a nurse in triage.

## 2018-08-15 ENCOUNTER — Other Ambulatory Visit: Payer: Self-pay | Admitting: Endocrinology

## 2018-08-16 NOTE — Telephone Encounter (Signed)
Sounds odd. I am not in office 8/7. No sure this has anything to do with heart.

## 2018-08-17 ENCOUNTER — Other Ambulatory Visit: Payer: Self-pay

## 2018-08-17 ENCOUNTER — Telehealth: Payer: Self-pay | Admitting: *Deleted

## 2018-08-17 ENCOUNTER — Encounter: Payer: Self-pay | Admitting: Cardiology

## 2018-08-17 ENCOUNTER — Ambulatory Visit (INDEPENDENT_AMBULATORY_CARE_PROVIDER_SITE_OTHER): Payer: BLUE CROSS/BLUE SHIELD | Admitting: Cardiology

## 2018-08-17 VITALS — BP 110/80 | HR 86 | Ht 76.0 in | Wt 183.8 lb

## 2018-08-17 DIAGNOSIS — R0683 Snoring: Secondary | ICD-10-CM

## 2018-08-17 DIAGNOSIS — E118 Type 2 diabetes mellitus with unspecified complications: Secondary | ICD-10-CM

## 2018-08-17 DIAGNOSIS — R55 Syncope and collapse: Secondary | ICD-10-CM

## 2018-08-17 DIAGNOSIS — I10 Essential (primary) hypertension: Secondary | ICD-10-CM

## 2018-08-17 DIAGNOSIS — R0789 Other chest pain: Secondary | ICD-10-CM

## 2018-08-17 DIAGNOSIS — E785 Hyperlipidemia, unspecified: Secondary | ICD-10-CM

## 2018-08-17 NOTE — Telephone Encounter (Signed)
-----   Message from Mendel Ryder, [place] sent at 08/17/2018 11:06 AM EDT ----- Haskel Khan [phone]  Pt needs a sleep study per Pecolia Ades, NP   DX: Snoring    Thank you

## 2018-08-17 NOTE — Progress Notes (Signed)
Cardiology Office Note:    Date:  08/17/2018   ID:  Cameron Cardenas, DOB 1955/09/30, MRN [id number]  PCP:  Merrilee Seashore, MD  Cardiologist:  Sinclair Grooms, MD  Referring MD: Merrilee Seashore, MD   Chief Complaint  Patient presents with  . Loss of Consciousness    Possible    History of Present Illness:    Cameron Cardenas is a 63 y.o. male with a past medical history significant for hyperlipidemia, type 2 diabetes, essential hypertension and abnormal EKG with bifascicular block.  Cameron Cardenas was noted to have had episodes of racing heart and wore a 30-day monitor which did not demonstrate any significant abnormality.  Cameron Cardenas has had complaints of some atypical chest pain.  Cameron Cardenas had a low risk stress test in 05/2018.  Coronary CTA on 07/10/2018 showed minimal nonobstructive CAD with calcium score of 14 which is at the 35th percentile for age and sex matched control.  Cameron Cardenas is being seen today for possible syncope. "Pt called to report that on Saturday 08/11/18 Cameron Cardenas was in the barber chair and Cameron Cardenas was "dozing" off and then the barber noticed Cameron Cardenas was not easily woken up and appeared Cameron Cardenas had passed out. They got him some water and Cameron Cardenas walked around with some dizziness but felt better and went home.. Cameron Cardenas reports that his chest has been "sore"... it hurts with breathing and movement... Cameron Cardenas denies any further dizziness, sob, headache. Cameron Cardenas did eat well that morning prior to his hair cut. Cameron Cardenas denies cough, fever, sob. "  Today Cameron Cardenas tells me that Cameron Cardenas feels like Cameron Cardenas fell asleep in the barber chair and when Cameron Cardenas got up after the incident, Cameron Cardenas noted left sided chest tightness and Cameron Cardenas got real hot. His left chest has been sore after, constant.  Cameron Cardenas is still a little tender to palpation in that area.  Cameron Cardenas did not note any lightheadedness prior to the incident. Cameron Cardenas was mildly short of breath but "not that much". Cameron Cardenas did notice a fast heart beat after the event when Cameron Cardenas first stood up.  No dizziness, lightheadedness, presyncope or  syncope since the episode on Saturday. His left chest is mildy tender to palpation today and Cameron Cardenas says this is what Cameron Cardenas felt.   Cameron Cardenas is on disability due to back pain s/p surgery X3. Cameron Cardenas walks 3 times per week for about 45 minutes. Cameron Cardenas has walked twice since the episode without chest pain/pressure or shortness of breath. Today Cameron Cardenas feels very well.   Pt has not taken his lisinopril-HCTZ today. I dont think checking orthostatic vital signs today would give [name] much information. Cameron Cardenas had taken his medications on Saturday.   Past Medical History:  Diagnosis Date  . Allergic rhinitis due to pollen   . Bilateral sciatica 07/09/2018  . Chronic pain syndrome 07/09/2018  . Chronic, continuous use of opioids 07/09/2018  . DDD (degenerative disc disease), lumbar   . Essential hypertension, malignant   . Hemorrhoids   . Lumbago   . Lumbar post-laminectomy syndrome 07/09/2018  . Plantar fasciitis of left foot   . Pure hypercholesterolemia   . Spinal stenosis of lumbar region 07/09/2018  . Type II or unspecified type diabetes mellitus without mention of complication, not stated as uncontrolled    dx in 2005  . Unspecified vitamin D deficiency     Past Surgical History:  Procedure Laterality Date  . COLONOSCOPY     in [facility], MD no longer in practice, does not recall the name  of facility. Does belive polyps were removed  . LUMBAR DISC SURGERY  2011   total of 3 sx on back per pt  . MAXIMUM ACCESS (MAS)POSTERIOR LUMBAR INTERBODY FUSION (PLIF) 1 LEVEL N/A 06/10/2014   Procedure: Redo Lumbar Five-Sacral One Decompression with maximum access posterior lumbar interbody fusion;  Surgeon: Erline Levine, MD;  Location: [facility];  Service: Neurosurgery;  Laterality: N/A;  Redo Decompression with maximum access posterior lumbar interbody fusion, L5-S1    Current Medications: Current Meds  Medication Sig  . aspirin 81 MG tablet Take 81 mg by mouth daily.  . Continuous Blood Gluc Sensor (FREESTYLE LIBRE 14 DAY SENSOR)  MISC 1 Units by Does not apply route every 14 (fourteen) days.  Marland Kitchen ezetimibe (ZETIA) 10 MG tablet Take 1 tablet by mouth once daily  . fluticasone (FLONASE) 50 MCG/ACT nasal spray USE 2 SPRAY(S) IN EACH NOSTRIL ONCE DAILY  . ipratropium (ATROVENT) 0.06 % nasal spray USE 2 SPRAY(S) IN EACH NOSTRIL THREE TIMES DAILY FOR 21 DAYS  . lisinopril-hydrochlorothiazide (ZESTORETIC) 10-12.5 MG tablet Take 1 tablet by mouth once daily  . oxyCODONE-acetaminophen (PERCOCET) 10-325 MG tablet Take 1 tablet by mouth every 6 (six) hours as needed for pain.  . pioglitazone-metformin (ACTOPLUS MET) 15-500 MG tablet Take 1 tablet by mouth twice daily  . rosuvastatin (CRESTOR) 20 MG tablet Take 1 tablet by mouth once daily     Allergies:   Patient has no known allergies.   Social History   Socioeconomic History  . Marital status: Married    Spouse name: Not on file  . Number of children: 1  . Years of education: 15  . Highest education level: High school graduate  Occupational History  . Occupation: disabled  Social Needs  . Financial resource strain: Not on file  . Food insecurity    Worry: Not on file    Inability: Not on file  . Transportation needs    Medical: Not on file    Non-medical: Not on file  Tobacco Use  . Smoking status: Former Smoker    Quit date: 08/28/1992    Years since quitting: 25.9  . Smokeless tobacco: Never Used  Substance and Sexual Activity  . Alcohol use: Yes    Comment: 6 pack beer lasts a month  . Drug use: Yes    Types: Marijuana  . Sexual activity: Not on file  Lifestyle  . Physical activity    Days per week: Not on file    Minutes per session: Not on file  . Stress: Not on file  Relationships  . Social Herbalist on phone: Not on file    Gets together: Not on file    Attends religious service: Not on file    Active member of club or organization: Not on file    Attends meetings of clubs or organizations: Not on file    Relationship status: Not on  file  Other Topics Concern  . Not on file  Social History Narrative   Lives with wife in a one story home.  Has one child.     Cameron Cardenas is on disability since January 2020, stopped working in February 2019 for low back pain.  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     Education: high school.       Family History: The patient's family history includes Cataracts in his sister; Diabetes in his mother and sister; Hypertension in his father and mother; Kidney disease in his mother; Seizures in his  brother. There is no history of Colon cancer, Esophageal cancer, Rectal cancer, Stomach cancer, Heart disease, or Colon polyps. ROS:   Please see the history of present illness.     All other systems reviewed and are negative.  EKGs/Labs/Other Studies Reviewed:    The following studies were reviewed today:  Coronary CTA 07/10/2018 IMPRESSION: 1. Coronary calcium score of 14. This was 66 percentile for age and sex matched control. 2. Normal coronary origin with right dominance. 3. Minimal non-obstructive CAD. Continue with risk factor modifications.  CARDIAC EVENT MONITOR 03/2018: Study Highlights   Underlying rhythm is normal sinus rhythm  Normal heart rate range  No significant arrhythmias. Specifically no AV block, atrial fibrillation, or VT.  Occasional PAC  Chest pain does not correlate with rhythm    STRESS NUCLEAR 05/2018: Study Highlights    The left ventricular ejection fraction is normal (55-65%).  Nuclear stress EF: 58%.  There was no ST segment deviation noted during stress.  The study is normal.  This is a low risk study.  This is a normal Lexiscan nuclear stress test with no evidence for a prior infarct or ischemia. LVEF 58%.     EKG:  EKG is ordered today.  The ekg ordered today demonstrates normal sinus rhythm with right atrial enlargement, right bundle branch block, bifascicular block, LVH with repolarization abnormality.  No significant changes from past EKG.  Recent Labs:  10/16/2017: ALT 60 01/30/2018: Hemoglobin 15.0; Platelets 138 03/21/2018: TSH 1.50 07/03/2018: BUN 17; Creatinine, Ser 1.23; Potassium 4.2; Sodium 144   Recent Lipid Panel    Component Value Date/Time   CHOL 146 10/16/2017 0815   TRIG 41.0 10/16/2017 0815   HDL 79.30 10/16/2017 0815   CHOLHDL 2 10/16/2017 0815   VLDL 8.2 10/16/2017 0815   LDLCALC 59 10/16/2017 0815    Physical Exam:    VS:  BP 110/80   Pulse 86   Ht '6\' 4"'$  (1.93 m)   Wt 183 lb 12.8 oz (83.4 kg)   SpO2 98%   BMI 22.37 kg/m     Wt Readings from Last 3 Encounters:  08/17/18 183 lb 12.8 oz (83.4 kg)  06/06/18 201 lb (91.2 kg)  05/29/18 196 lb (88.9 kg)     Physical Exam  Constitutional: Cameron Cardenas is oriented to person, place, and time. Cameron Cardenas appears well-developed and well-nourished. No distress.  HENT:  Head: Normocephalic and atraumatic.  Neck: Normal range of motion. Neck supple. No JVD present.  Cardiovascular: Normal rate, regular rhythm, normal heart sounds and intact distal pulses. Exam reveals no gallop and no friction rub.  No murmur heard. Pulmonary/Chest: Effort normal and breath sounds normal. No respiratory distress. Cameron Cardenas has no wheezes. Cameron Cardenas has no rales.  Abdominal: Soft. Bowel sounds are normal.  Musculoskeletal: Normal range of motion.        General: No deformity or edema.  Neurological: Cameron Cardenas is alert and oriented to person, place, and time.  Skin: Skin is warm and dry.  Psychiatric: Cameron Cardenas has a normal mood and affect. His behavior is normal. Judgment and thought content normal.  Vitals reviewed.    ASSESSMENT:    1. Snoring   2. Syncope and collapse   3. Other chest pain   4. Essential (primary) hypertension   5. Hyperlipidemia, unspecified hyperlipidemia type   6. Type 2 diabetes mellitus with complication, without long-term current use of insulin (HCC)    PLAN:    In order of problems listed above:  Possible syncope -Pt with odd  event while in barber chair. Cameron Cardenas feels like Cameron Cardenas fell asleep and  was groggy when the barber roused him. Cameron Cardenas does not recall any prior symptoms. Cameron Cardenas did have some feeling of fast heart beat, mild left chest tightness after.  -Patient with recent coronary CTA showing very minimal coronary artery plaquing.  No significant CAD. -Patient wore a cardiac event monitor earlier this year with no significant events noted.  I discussed with the patient applying another cardiac event monitor.  Together we decided that Cameron Cardenas will continue to monitor his symptoms and if Cameron Cardenas has recurrence Cameron Cardenas will contact us and we will order another monitor. -I question whether Cameron Cardenas may have obstructive sleep apnea, fell asleep in the barber chair and perhaps was orthostatic when Cameron Cardenas jumped up once the barber aroused him.  Cameron Cardenas says that his wife has long complained of his snoring.  I discussed the benefits of a sleep study and the patient is in agreement.  I will order a sleep study.  Atypical chest pain -Patient has had a history of atypical chest pain.  Cameron Cardenas had some mild left-sided chest tightness after his recent event which was constant and is reproducible by palpation by me in the office. -Cameron Cardenas had a low risk stress test in 05/2018.   -Coronary CTA on 07/10/2018 showed minimal nonobstructive CAD with calcium score of 14 which is at the 35th percentile for age and sex matched control. -I do not feel like his chest discomfort is cardiac related.  Hypertension -Lisinopril-hydrochlorothiazide 10-12.5 mg daily -Labs 07/03/2018 showed serum creatinine 1.23, potassium 4.2. -Blood pressure is well controlled today even though Cameron Cardenas has not taken his medication.  I question whether Cameron Cardenas has had some orthostatic hypotension, although Cameron Cardenas was sitting still when Cameron Cardenas had his event.  Cameron Cardenas may have just fallen asleep in the chair and then when Cameron Cardenas roused up and stood up quickly from the chair, this may have caused most of his symptoms. -If Cameron Cardenas has further symptoms, I would suggest checking orthostatic vital signs and Cameron Cardenas may need a  decrease in his medication.  Hyperlipidemia -On Crestor 20 mg and Zetia 10 mg -Lipids well controlled by labs 10/2017  Type 2 diabetes -A1c was 6.0 in 04/2018.  Well controlled.  Suspected sleep apnea -Wife has told him that Cameron Cardenas snores. Cameron Cardenas does not note day time sleepiness but Cameron Cardenas does need to lay down every day.    Medication Adjustments/Labs and Tests Ordered: Current medicines are reviewed at length with the patient today.  Concerns regarding medicines are outlined above. Labs and tests ordered and medication changes are outlined in the patient instructions below:  Patient Instructions  Medication Instructions:  Your physician recommends that you continue on your current medications as directed. Please refer to the Current Medication list given to you today.  If you need a refill on your cardiac medications before your next appointment, please call your pharmacy.   Lab work: None   If you have labs (blood work) drawn today and your tests are completely normal, you will receive your results only by: Marland Kitchen MyChart Message (if you have MyChart) OR . A paper copy in the mail If you have any lab test that is abnormal or we need to change your treatment, we will call you to review the results.  Testing/Procedures: Your physician has recommended that you have a sleep study. This test records several body functions during sleep, including: brain activity, eye movement, oxygen and carbon dioxide blood levels, heart rate and  rhythm, breathing rate and rhythm, the flow of air through your mouth and nose, snoring, body muscle movements, and chest and belly movement. (Someone from out sleep department will contact you about scheduling)  Follow-Up: At [facility], you and your health needs are our priority.  As part of our continuing mission to provide you with exceptional heart care, we have created designated Provider Care Teams.  These Care Teams include your primary Cardiologist (physician) and  Advanced Practice Providers (APPs -  Physician Assistants and Nurse Practitioners) who all work together to provide you with the care you need, when you need it. You will need a follow up appointment in 6 months.  Please call our office 2 months in advance to schedule this appointment.  You may see Sinclair Grooms, MD or one of the following Advanced Practice Providers on your designated Care Team:   Truitt Merle, NP Cecilie Kicks, NP . Kathyrn Drown, NP  Any Other Special Instructions Will Be Listed Below (If Applicable).  Lifestyle Modifications to Prevent and Treat Heart Disease -Recommend heart healthy/Mediterranean diet, with whole grains, fruits, vegetable, fish, lean meats, nuts, and olive oil.  -Limit salt. -Recommend moderate walking, 3-5 times/week for 30-50 minutes each session. Aim for at least 150 minutes.week. Goal should be pace of 3 miles/hours, or walking 1.5 miles in 30 minutes -Recommend avoidance of tobacco products. Avoid excess alcohol. -Keep blood pressure well controlled, ideally less than 130/80.   Syncope Syncope is when you pass out (faint) for a short time. It is caused by a sudden decrease in blood flow to the brain. Signs that you may be about to pass out include:  Feeling dizzy or light-headed.  Feeling sick to your stomach (nauseous).  Seeing all white or all black.  Having cold, clammy skin. If you pass out, get help right away. Call your local emergency services (911 in the [place] Do not drive yourself to the hospital. Follow these instructions at home: Watch for any changes in your symptoms. Take these actions to stay safe and help with your symptoms: Lifestyle  Do not drive, use machinery, or play sports until your doctor says it is okay.  Do not drink alcohol.  Do not use any products that contain nicotine or tobacco, such as cigarettes and e-cigarettes. If you need help quitting, ask your doctor.  Drink enough fluid to keep your pee (urine)  pale yellow. General instructions  Take over-the-counter and prescription medicines only as told by your doctor.  If you are taking blood pressure or heart medicine, sit up and stand up slowly. Spend a few minutes getting ready to sit and then stand. This can help you feel less dizzy.  Have someone stay with you until you feel stable.  If you start to feel like you might pass out, lie down right away and raise (elevate) your feet above the level of your heart. Breathe deeply and steadily. Wait until all of the symptoms are gone.  Keep all follow-up visits as told by your doctor. This is important. Get help right away if:  You have a very bad headache.  You pass out once or more than once.  You have pain in your chest, belly, or back.  You have a very fast or uneven heartbeat (palpitations).  It hurts to breathe.  You are bleeding from your mouth or your bottom (rectum).  You have black or tarry poop (stool).  You have jerky movements that you cannot control (seizure).  You  are confused.  You have trouble walking.  You are very weak.  You have vision problems. These symptoms may be an emergency. Do not wait to see if the symptoms will go away. Get medical help right away. Call your local emergency services (911 in the [place] Do not drive yourself to the hospital. Summary  Syncope is when you pass out (faint) for a short time. It is caused by a sudden decrease in blood flow to the brain.  Signs that you may be about to faint include feeling dizzy, light-headed, or sick to your stomach, seeing all white or all black, or having cold, clammy skin.  If you start to feel like you might pass out, lie down right away and raise (elevate) your feet above the level of your heart. Breathe deeply and steadily. Wait until all of the symptoms are gone. This information is not intended to replace advice given to you by your health care provider. Make sure you discuss any questions you  have with your health care provider. Document Released: 06/15/2007 Document Revised: 02/08/2017 Document Reviewed: 02/08/2017 Elsevier Patient Education  2020 [place], Daune Perch, NP  08/17/2018 1:17 PM    [place]

## 2018-08-17 NOTE — Addendum Note (Signed)
Addended by: Jeremy Johann on: 08/17/2018 03:24 PM   Modules accepted: Orders

## 2018-08-17 NOTE — Patient Instructions (Addendum)
Medication Instructions:  Your physician recommends that you continue on your current medications as directed. Please refer to the Current Medication list given to you today.  If you need a refill on your cardiac medications before your next appointment, please call your pharmacy.   Lab work: None   If you have labs (blood work) drawn today and your tests are completely normal, you will receive your results only by: Marland Kitchen MyChart Message (if you have MyChart) OR . A paper copy in the mail If you have any lab test that is abnormal or we need to change your treatment, we will call you to review the results.  Testing/Procedures: Your physician has recommended that you have a sleep study. This test records several body functions during sleep, including: brain activity, eye movement, oxygen and carbon dioxide blood levels, heart rate and rhythm, breathing rate and rhythm, the flow of air through your mouth and nose, snoring, body muscle movements, and chest and belly movement. (Someone from out sleep department will contact you about scheduling)  Follow-Up: At [facility], you and your health needs are our priority.  As part of our continuing mission to provide you with exceptional heart care, we have created designated Provider Care Teams.  These Care Teams include your primary Cardiologist (physician) and Advanced Practice Providers (APPs -  Physician Assistants and Nurse Practitioners) who all work together to provide you with the care you need, when you need it. You will need a follow up appointment in 6 months.  Please call our office 2 months in advance to schedule this appointment.  You may see Sinclair Grooms, MD or one of the following Advanced Practice Providers on your designated Care Team:   Truitt Merle, NP Cecilie Kicks, NP . Kathyrn Drown, NP  Any Other Special Instructions Will Be Listed Below (If Applicable).  Lifestyle Modifications to Prevent and Treat Heart  Disease -Recommend heart healthy/Mediterranean diet, with whole grains, fruits, vegetable, fish, lean meats, nuts, and olive oil.  -Limit salt. -Recommend moderate walking, 3-5 times/week for 30-50 minutes each session. Aim for at least 150 minutes.week. Goal should be pace of 3 miles/hours, or walking 1.5 miles in 30 minutes -Recommend avoidance of tobacco products. Avoid excess alcohol. -Keep blood pressure well controlled, ideally less than 130/80.   Syncope Syncope is when you pass out (faint) for a short time. It is caused by a sudden decrease in blood flow to the brain. Signs that you may be about to pass out include:  Feeling dizzy or light-headed.  Feeling sick to your stomach (nauseous).  Seeing all white or all black.  Having cold, clammy skin. If you pass out, get help right away. Call your local emergency services (911 in the [place] Do not drive yourself to the hospital. Follow these instructions at home: Watch for any changes in your symptoms. Take these actions to stay safe and help with your symptoms: Lifestyle  Do not drive, use machinery, or play sports until your doctor says it is okay.  Do not drink alcohol.  Do not use any products that contain nicotine or tobacco, such as cigarettes and e-cigarettes. If you need help quitting, ask your doctor.  Drink enough fluid to keep your pee (urine) pale yellow. General instructions  Take over-the-counter and prescription medicines only as told by your doctor.  If you are taking blood pressure or heart medicine, sit up and stand up slowly. Spend a few minutes getting ready to sit and then stand. This  can help you feel less dizzy.  Have someone stay with you until you feel stable.  If you start to feel like you might pass out, lie down right away and raise (elevate) your feet above the level of your heart. Breathe deeply and steadily. Wait until all of the symptoms are gone.  Keep all follow-up visits as told by your  doctor. This is important. Get help right away if:  You have a very bad headache.  You pass out once or more than once.  You have pain in your chest, belly, or back.  You have a very fast or uneven heartbeat (palpitations).  It hurts to breathe.  You are bleeding from your mouth or your bottom (rectum).  You have black or tarry poop (stool).  You have jerky movements that you cannot control (seizure).  You are confused.  You have trouble walking.  You are very weak.  You have vision problems. These symptoms may be an emergency. Do not wait to see if the symptoms will go away. Get medical help right away. Call your local emergency services (911 in the [place] Do not drive yourself to the hospital. Summary  Syncope is when you pass out (faint) for a short time. It is caused by a sudden decrease in blood flow to the brain.  Signs that you may be about to faint include feeling dizzy, light-headed, or sick to your stomach, seeing all white or all black, or having cold, clammy skin.  If you start to feel like you might pass out, lie down right away and raise (elevate) your feet above the level of your heart. Breathe deeply and steadily. Wait until all of the symptoms are gone. This information is not intended to replace advice given to you by your health care provider. Make sure you discuss any questions you have with your health care provider. Document Released: 06/15/2007 Document Revised: 02/08/2017 Document Reviewed: 02/08/2017 Elsevier Patient Education  2020 [employer].

## 2018-08-18 ENCOUNTER — Other Ambulatory Visit: Payer: Self-pay | Admitting: Endocrinology

## 2018-08-22 ENCOUNTER — Telehealth: Payer: Self-pay | Admitting: *Deleted

## 2018-08-22 NOTE — Telephone Encounter (Signed)
Staff message sent to Cameron Cardenas received Cameron Cardenas for sleep study. Ok to schedule. Order # [id number]. Valid dates 08/22/18 to 02/18/19.

## 2018-08-23 ENCOUNTER — Telehealth: Payer: Self-pay | Admitting: *Deleted

## 2018-08-23 NOTE — Telephone Encounter (Signed)
-----   Message from Lauralee Evener, [place] sent at 08/22/2018  1:54 PM EDT ----- Received Cameron Cardenas. Ok to schedule sleep study. Order # [id number]. Valid dates 08/22/18 to 02/17/18 ----- Message ----- From: Mendel Ryder, CMA Sent: 08/17/2018  11:06 AM EDT To: Freada Bergeron, CMA, [facility] Sleep Studies  Cameron Cardenas [phone]  Pt needs a sleep study per Pecolia Ades, NP   DX: Snoring    Thank you

## 2018-08-23 NOTE — Telephone Encounter (Signed)
Patient is scheduled for lab study on 8/23. Patient understands his sleep study will be done at [facility] sleep lab. Patient understands he will receive a sleep packet in a week or so. Patient understands to call if he does not receive the sleep packet in a timely manner. Pt is scheduled for COVID screening on 8/20 at 12 pm prior to titration.  Patient agrees with treatment and thanked me for call.

## 2018-08-24 ENCOUNTER — Ambulatory Visit (INDEPENDENT_AMBULATORY_CARE_PROVIDER_SITE_OTHER): Payer: BLUE CROSS/BLUE SHIELD | Admitting: Primary Care

## 2018-08-27 ENCOUNTER — Other Ambulatory Visit: Payer: Self-pay | Admitting: Neurology

## 2018-08-27 DIAGNOSIS — R519 Headache, unspecified: Secondary | ICD-10-CM

## 2018-08-29 ENCOUNTER — Other Ambulatory Visit (HOSPITAL_COMMUNITY)
Admission: RE | Admit: 2018-08-29 | Discharge: 2018-08-29 | Disposition: A | Discharge status: Home / Self Care | Payer: BLUE CROSS/BLUE SHIELD | Source: Ambulatory Visit | Attending: Cardiology | Admitting: Cardiology

## 2018-08-29 ENCOUNTER — Other Ambulatory Visit: Payer: Self-pay | Admitting: Neurology

## 2018-08-29 DIAGNOSIS — Z01812 Encounter for preprocedural laboratory examination: Secondary | ICD-10-CM | POA: Diagnosis present

## 2018-08-29 DIAGNOSIS — Z20828 Contact with and (suspected) exposure to other viral communicable diseases: Secondary | ICD-10-CM | POA: Diagnosis not present

## 2018-08-29 LAB — SARS CORONAVIRUS 2 (TAT 6-24 HRS): SARS Coronavirus 2: NEGATIVE

## 2018-08-30 ENCOUNTER — Telehealth: Payer: Self-pay

## 2018-08-30 ENCOUNTER — Other Ambulatory Visit (HOSPITAL_COMMUNITY): Payer: BLUE CROSS/BLUE SHIELD

## 2018-08-30 NOTE — Telephone Encounter (Signed)
The patient has been notified of the result and verbalized understanding.  All questions (if any) were answered. Wilma Flavin, RN 08/30/2018 10:39 AM

## 2018-08-31 ENCOUNTER — Other Ambulatory Visit: Payer: BLUE CROSS/BLUE SHIELD

## 2018-09-02 ENCOUNTER — Ambulatory Visit (HOSPITAL_BASED_OUTPATIENT_CLINIC_OR_DEPARTMENT_OTHER): Payer: BLUE CROSS/BLUE SHIELD | Attending: Cardiology | Admitting: Cardiology

## 2018-09-09 ENCOUNTER — Other Ambulatory Visit: Payer: Self-pay | Admitting: Endocrinology

## 2018-09-12 ENCOUNTER — Ambulatory Visit: Payer: BLUE CROSS/BLUE SHIELD | Admitting: Nurse Practitioner

## 2018-09-12 ENCOUNTER — Other Ambulatory Visit: Payer: Self-pay | Admitting: Endocrinology

## 2018-09-12 ENCOUNTER — Ambulatory Visit: Payer: BLUE CROSS/BLUE SHIELD

## 2018-09-13 ENCOUNTER — Telehealth: Payer: Self-pay | Admitting: Anesthesiology

## 2018-09-13 NOTE — Telephone Encounter (Signed)
I have reached out to Dr. Andree Elk via text. He is out of the office and will do it first thing tomorrow morning.

## 2018-09-13 NOTE — Telephone Encounter (Signed)
Patient called stating Dr. Andree Elk did not send in his scripts for percocet and gabapentin for this month refill  from his virtual visit in July. Please check this and let patient know what to do. He is out today. Has appt scheduled 10-01-18 w/ Dr. Andree Elk.

## 2018-09-14 MED ORDER — GABAPENTIN 300 MG PO CAPS: 300.0000 mg | ORAL_CAPSULE | Freq: Two times a day (BID) | ORAL | 3 refills | Status: DC

## 2018-09-14 MED ORDER — OXYCODONE HCL 10 MG PO TABS: 10.0000 mg | ORAL_TABLET | Freq: Four times a day (QID) | ORAL | 0 refills | Status: DC

## 2018-09-14 NOTE — Telephone Encounter (Signed)
Dr Andree Elk reminded.

## 2018-09-14 NOTE — Addendum Note (Signed)
Addended by: Molli Barrows on: 09/14/2018 11:08 AM   Modules accepted: Orders

## 2018-09-14 NOTE — Telephone Encounter (Signed)
Nonnie Done can you call and remind Dr. Andree Elk to send in patient meds this am. He is in a lot of pain and was very distraught this am.

## 2018-09-17 ENCOUNTER — Other Ambulatory Visit: Payer: Self-pay | Admitting: Endocrinology

## 2018-09-20 MED ORDER — OXYCODONE HCL 10 MG PO TABS: 10.0000 mg | ORAL_TABLET | Freq: Four times a day (QID) | ORAL | 0 refills | Status: DC

## 2018-09-20 NOTE — Addendum Note (Signed)
Addended by: Molli Barrows on: 09/20/2018 03:04 PM   Modules accepted: Orders

## 2018-09-28 ENCOUNTER — Other Ambulatory Visit: Payer: BLUE CROSS/BLUE SHIELD

## 2018-09-30 ENCOUNTER — Ambulatory Visit
Admission: RE | Admit: 2018-09-30 | Discharge: 2018-09-30 | Disposition: A | Discharge status: Home / Self Care | Payer: BLUE CROSS/BLUE SHIELD | Source: Ambulatory Visit | Attending: Neurology | Admitting: Neurology

## 2018-09-30 ENCOUNTER — Other Ambulatory Visit: Payer: Self-pay

## 2018-09-30 DIAGNOSIS — G8929 Other chronic pain: Secondary | ICD-10-CM

## 2018-09-30 MED ORDER — GADOBENATE DIMEGLUMINE 529 MG/ML IV SOLN
17.0000 mL | Freq: Once | INTRAVENOUS | Status: AC | PRN
  Administered 2018-09-30: 17 mL via INTRAVENOUS

## 2018-10-01 ENCOUNTER — Encounter: Payer: Self-pay | Admitting: Anesthesiology

## 2018-10-01 ENCOUNTER — Ambulatory Visit: Payer: BLUE CROSS/BLUE SHIELD | Attending: Anesthesiology | Admitting: Anesthesiology

## 2018-10-01 ENCOUNTER — Telehealth: Payer: Self-pay | Admitting: Internal Medicine

## 2018-10-01 DIAGNOSIS — M5431 Sciatica, right side: Secondary | ICD-10-CM

## 2018-10-01 DIAGNOSIS — F1190 Opioid use, unspecified, uncomplicated: Secondary | ICD-10-CM

## 2018-10-01 DIAGNOSIS — M48062 Spinal stenosis, lumbar region with neurogenic claudication: Secondary | ICD-10-CM

## 2018-10-01 DIAGNOSIS — M5432 Sciatica, left side: Secondary | ICD-10-CM

## 2018-10-01 DIAGNOSIS — M5126 Other intervertebral disc displacement, lumbar region: Secondary | ICD-10-CM

## 2018-10-01 DIAGNOSIS — M961 Postlaminectomy syndrome, not elsewhere classified: Secondary | ICD-10-CM

## 2018-10-01 DIAGNOSIS — G894 Chronic pain syndrome: Secondary | ICD-10-CM

## 2018-10-01 MED ORDER — OXYCODONE-ACETAMINOPHEN 10-325 MG PO TABS: 1.0000 | ORAL_TABLET | Freq: Four times a day (QID) | ORAL | 0 refills | Status: DC | PRN

## 2018-10-01 NOTE — Telephone Encounter (Signed)
The pt is complaining of painful hemorrhoids with BRB.  He states it is very painful to even walk.  He has been doing sitz baths without relief.  He is also using Proctozone 2.5% three times daily without any relief. He is constipated.  He was advised to begin miralax and increase his water.  He has a follow up with Dr Carlean Purl on 10/20.  Please advise

## 2018-10-01 NOTE — Telephone Encounter (Signed)
Cameron Cardenas has 2 openings tomorrow and we need to add him on for one of those openings to sort this out  He can try some OTC Recticare tonight bs waiting til tomorrow to be seen

## 2018-10-01 NOTE — Patient Instructions (Signed)
V

## 2018-10-01 NOTE — Telephone Encounter (Signed)
The pt has been scheduled for tomorrow with Nevin Bloodgood at 3 pm and advised to try recticare tonight.  The pt has been advised of the information and verbalized understanding.

## 2018-10-02 ENCOUNTER — Ambulatory Visit (INDEPENDENT_AMBULATORY_CARE_PROVIDER_SITE_OTHER): Payer: Self-pay | Admitting: Nurse Practitioner

## 2018-10-02 ENCOUNTER — Encounter: Payer: Self-pay | Admitting: Nurse Practitioner

## 2018-10-02 VITALS — BP 106/60 | HR 86 | Temp 97.9°F | Ht 76.0 in | Wt 183.0 lb

## 2018-10-02 DIAGNOSIS — K602 Anal fissure, unspecified: Secondary | ICD-10-CM

## 2018-10-02 DIAGNOSIS — K5900 Constipation, unspecified: Secondary | ICD-10-CM

## 2018-10-02 MED ORDER — DILTIAZEM GEL 2 %: 1.0000 "application " | Freq: Three times a day (TID) | CUTANEOUS | 1 refills | Status: DC

## 2018-10-02 NOTE — Patient Instructions (Addendum)
If you are age 64 or older, your body mass index should be between 23-30. Your Body mass index is 22.28 kg/m. If this is out of the aforementioned range listed, please consider follow up with your Primary Care Provider.  If you are age 72 or younger, your body mass index should be between 19-25. Your Body mass index is 22.28 kg/m. If this is out of the aformentioned range listed, please consider follow up with your Primary Care Provider.   We have sent the following medications to your pharmacy for you to pick up at your convenience: Diltiazem Gel - [facility].  [employer]  Start Colace - one at bedtime.  Continue Miralax daily.  Soak in the tub three times daily.  Follow up with me on November 07, 2018 at 2 pm.  Thank you for choosing me and [facility].   Tye Savoy, NP

## 2018-10-02 NOTE — Progress Notes (Signed)
Chief Complaint:    Rectal pain and bleeding  IMPRESSION and PLAN:     63 year old male with 61-monthhistory of rectal pain and now minor rectal bleeding over the last few weeks in setting of constipation.  Based on circumstances and exam I suspect anal fissure.   -Stop steroid cream -Okay to use RectiCare as needed for pain -Diltiazem gel 3 times daily x 6 weeks.  I showed him how to apply the medication inside the anal canal up to the first knuckle\ -Warm tub soaks 3 times daily when possible -Continue daily MiraLAX -Start Colace stool softener -Follow-up with me in approximately 4 weeks, sooner if getting worse  HPI:     Patient is a 63year old male known to Dr. GCarlean Purl  He has a history of colon polyps and hemorrhoids.  Patient is worked in today for evaluation of rectal pain and bleeding.  About 2 months ago patient began having severe rectal pain in the setting of constipation.  Few weeks ago he began having rectal bleeding as well.  Over the last couple months he has been using what sounds like a prescription steroid cream for hemorrhoids.  A few days ago he began using RectiCare for the pain.  His pain is worse with bowel movements but also hurts when walking.  Colonoscopy Oct 2019 One 3 mm polyp in the ascending colon, removed with a cold snare. Resected and retrieved. - One 1 mm polyp in the ascending colon, removed with a cold biopsy forceps. Resected and retrieved. - Diverticulosis in the sigmoid colon. - Internal hemorrhoids. - Scar in the distal rectum. - The examination was otherwise normal on direct and retroflexion views. - Personal history of colonic polyps.   Review of systems:     No chest pain, no SOB, no fevers, no urinary sx   Past Medical History:  Diagnosis Date  . Allergic rhinitis due to pollen   . Bilateral sciatica 07/09/2018  . Chronic pain syndrome 07/09/2018  . Chronic, continuous use of opioids 07/09/2018  . DDD (degenerative disc  disease), lumbar   . Essential hypertension, malignant   . Hemorrhoids   . Lumbago   . Lumbar post-laminectomy syndrome 07/09/2018  . Plantar fasciitis of left foot   . Pure hypercholesterolemia   . Spinal stenosis of lumbar region 07/09/2018  . Type II or unspecified type diabetes mellitus without mention of complication, not stated as uncontrolled    dx in 2005  . Unspecified vitamin D deficiency     Patient's surgical history, family medical history, social history, medications and allergies were all reviewed in Epic    Current Outpatient Medications  Medication Sig Dispense Refill  . aspirin 81 MG tablet Take 81 mg by mouth daily.    .Marland Kitchenezetimibe (ZETIA) 10 MG tablet Take 1 tablet by mouth once daily 90 tablet 0  . fluticasone (FLONASE) 50 MCG/ACT nasal spray USE 2 SPRAY(S) IN EACH NOSTRIL ONCE DAILY    . gabapentin (NEURONTIN) 300 MG capsule Take 1 capsule (300 mg total) by mouth 2 (two) times daily. 60 capsule 3  . Lidocaine, Anorectal, (RECTICARE) 5 % CREA Apply topically as needed.    .Derrill MemoON 12/13/2018] oxyCODONE-acetaminophen (PERCOCET) 10-325 MG tablet Take 1 tablet by mouth every 6 (six) hours as needed for pain. 120 tablet 0  . [START ON 10/14/2018] oxyCODONE-acetaminophen (PERCOCET) 10-325 MG tablet Take 1 tablet by mouth every 6 (six) hours as needed for pain. 120 tablet 0  .  pioglitazone-metformin (ACTOPLUS MET) 15-500 MG tablet Take 1 tablet by mouth twice daily 60 tablet 0  . rosuvastatin (CRESTOR) 20 MG tablet Take 1 tablet by mouth once daily 90 tablet 1  . Continuous Blood Gluc Sensor (FREESTYLE LIBRE 14 DAY SENSOR) MISC 1 Units by Does not apply route every 14 (fourteen) days. (Patient not taking: Reported on 10/02/2018) 2 each 4  . lisinopril-hydrochlorothiazide (ZESTORETIC) 10-12.5 MG tablet Take 1 tablet by mouth once daily (Patient not taking: Reported on 10/02/2018) 30 tablet 0   No current facility-administered medications for this visit.     Physical Exam:      BP 106/60   Pulse 86   Temp 97.9 F (36.6 C)   Ht _0  (1.93 m)   Wt 183 lb (83 kg)   BMI 22.28 kg/m   GENERAL:  Pleasant well developed male in NAD PSYCH: : Cooperative, normal affect PULM: Normal respiratory effort RECTAL: a few fleshy hemorrhoidal tags. Significant discomfort around posterior midline just with an attempt at DRE so it wasn't done. No obvious fissure.  Musculoskeletal:  Normal muscle tone, normal strength NEURO: Alert and oriented x 3, no focal neurologic deficits   Tye Savoy , NP 10/02/2018, 3:41 PM

## 2018-10-03 NOTE — Progress Notes (Signed)
Virtual Visit via Video Note  I connected with Cameron Cardenas on 10/03/18 at  2:30 PM EDT by a video enabled telemedicine application and verified that I am speaking with the correct person using two identifiers.  Location: Patient: Home Provider: Pain control center   I discussed the limitations of evaluation and management by telemedicine and the availability of in person appointments. The patient expressed understanding and agreed to proceed.  History of Present Illness: I spoke with Cameron Cardenas via video conferencing today.  He has been doing well with his medication management.  He did better with the acetaminophen in the oxycodone combination.  He is asking for change there.  Based on our discussion today he continues to get good relief with the opioid medications as he has failed more conservative therapy.  The quality characteristic distribution of his low back pain leg pain and intermittent body pain are unchanged.  His lower extremity strength and function remain at baseline no changes in bowel or bladder function are noted.  He feels that he is doing well overall and would like to switch from the oxycodone to the oxycodone with acetaminophen.  Otherwise he is in his usual state of health and is doing reasonably well.  He is trying to do some physical therapy exercises.    Observations/Objective: Current Outpatient Medications:  .  aspirin 81 MG tablet, Take 81 mg by mouth daily., Disp: , Rfl:  .  Continuous Blood Gluc Sensor (FREESTYLE LIBRE 14 DAY SENSOR) MISC, 1 Units by Does not apply route every 14 (fourteen) days. (Patient not taking: Reported on 10/02/2018), Disp: 2 each, Rfl: 4 .  diltiazem 2 % GEL, Apply 1 application topically 3 (three) times daily. Use three times daily as directed for 6 weeks., Disp: 30 g, Rfl: 1 .  ezetimibe (ZETIA) 10 MG tablet, Take 1 tablet by mouth once daily, Disp: 90 tablet, Rfl: 0 .  fluticasone (FLONASE) 50 MCG/ACT nasal spray, USE 2 SPRAY(S) IN EACH  NOSTRIL ONCE DAILY, Disp: , Rfl:  .  gabapentin (NEURONTIN) 300 MG capsule, Take 1 capsule (300 mg total) by mouth 2 (two) times daily., Disp: 60 capsule, Rfl: 3 .  Lidocaine, Anorectal, (RECTICARE) 5 % CREA, Apply topically as needed., Disp: , Rfl:  .  lisinopril-hydrochlorothiazide (ZESTORETIC) 10-12.5 MG tablet, Take 1 tablet by mouth once daily (Patient not taking: Reported on 10/02/2018), Disp: 30 tablet, Rfl: 0 .  [START ON 12/13/2018] oxyCODONE-acetaminophen (PERCOCET) 10-325 MG tablet, Take 1 tablet by mouth every 6 (six) hours as needed for pain., Disp: 120 tablet, Rfl: 0 .  [START ON 10/14/2018] oxyCODONE-acetaminophen (PERCOCET) 10-325 MG tablet, Take 1 tablet by mouth every 6 (six) hours as needed for pain., Disp: 120 tablet, Rfl: 0 .  pioglitazone-metformin (ACTOPLUS MET) 15-500 MG tablet, Take 1 tablet by mouth twice daily, Disp: 60 tablet, Rfl: 0 .  rosuvastatin (CRESTOR) 20 MG tablet, Take 1 tablet by mouth once daily, Disp: 90 tablet, Rfl: 1   Assessment and Plan: 1. Herniated lumbar intervertebral disc   2. Chronic, continuous use of opioids   3. Chronic pain syndrome   4. Spinal stenosis of lumbar region with neurogenic claudication   5. Lumbar post-laminectomy syndrome   6. Bilateral sciatica   7. Failed back syndrome of lumbar spine   Based upon our discussion today and upon review of the [facility] practitioner database information I am going to refill his medications for the next 2 months.  This will give him prescription refills for October  which is outstanding as well as November and December.  I have him return to clinic in 2 months for repeat evaluation.  I want him to continue with core stretching strengthening exercises as tolerated and he is instructed to contact us at the pain control center if he should have any problems in the meantime.  I also want him to follow-up with his primary care physicians for his baseline medical care.   Follow Up Instructions:    I  discussed the assessment and treatment plan with the patient. The patient was provided an opportunity to ask questions and all were answered. The patient agreed with the plan and demonstrated an understanding of the instructions.   The patient was advised to call back or seek an in-person evaluation if the symptoms worsen or if the condition fails to improve as anticipated.  I provided 30 minutes of non-face-to-face time during this encounter.   Molli Barrows, MD

## 2018-10-09 ENCOUNTER — Other Ambulatory Visit: Payer: Self-pay | Admitting: Neurology

## 2018-10-25 ENCOUNTER — Telehealth: Payer: Self-pay | Admitting: Gastroenterology

## 2018-10-25 NOTE — Telephone Encounter (Signed)
Dr Carlean Purl please review in [name]'s absence. Thanks Patient is being treated for an anal fissure. Started diltiazem gel 10/02/18 and instructed to take Miralax and Colace to maintain soft bowel movements. He calls today after he has also talked with the after hours GI. Complains of abdominal pain and no bowel movement in 3 days. He used 1 Fleet enema last night. He has had 3 doses of Miralax in the last 24 hours. He has taken Colace today. He is refilling the Diltiazem gel today. He ran out of the gel. He had some blood last night. Still no bowel movement other than "tiny piece last night after the enema.

## 2018-10-25 NOTE — Telephone Encounter (Signed)
Spoke with the patient.  Explained in detail how to do the purge. Spelled out Dulcolax.  He has recorded his instructions. Agrees to be here tomorrow at 1pm for a 1:10 pm appointment/work-in

## 2018-10-25 NOTE — Telephone Encounter (Signed)
Let's add him on for tomorrow - talk to PJ - either 110 or  410    Have him do a MiraLax purge   4 dulcolax  Wait 1 hour  Then drink 6 doses of MiraLax over 2 hours

## 2018-10-25 NOTE — Telephone Encounter (Signed)
He called with constipation, probably related to narcotic pain meds.  Feels impacted. He did not sound like he needed ER evaluation.  I recommended fleet enema times two and several doses of miralax.   Ed Blalock

## 2018-10-25 NOTE — Telephone Encounter (Signed)
Pt states that he did the enema and took miralax yesterday as Dr. Ardis Hughs recommended but he was not successful in having a bm, he said that the constipation is very bad. He would like more advise on what else to take or he would like to be seen asap, He said that he could come this afternoon. Pls call him.

## 2018-10-25 NOTE — Telephone Encounter (Signed)
Patient calls back. He cannot get a ride here tomorrow. He will talk to me on the phone tomorrow. Keep his appointment that is already scheduled for 10/30/18 with Dr Carlean Purl.

## 2018-10-26 ENCOUNTER — Ambulatory Visit: Payer: BLUE CROSS/BLUE SHIELD | Admitting: Internal Medicine

## 2018-10-28 ENCOUNTER — Encounter (HOSPITAL_COMMUNITY): Payer: Self-pay | Admitting: Emergency Medicine

## 2018-10-28 ENCOUNTER — Other Ambulatory Visit: Payer: Self-pay

## 2018-10-28 ENCOUNTER — Emergency Department (HOSPITAL_COMMUNITY)
Admission: EM | Admit: 2018-10-28 | Discharge: 2018-10-28 | Disposition: A | Discharge status: Home / Self Care | Payer: BLUE CROSS/BLUE SHIELD | Attending: Emergency Medicine | Admitting: Emergency Medicine

## 2018-10-28 DIAGNOSIS — M545 Low back pain: Secondary | ICD-10-CM | POA: Diagnosis present

## 2018-10-28 DIAGNOSIS — Z7982 Long term (current) use of aspirin: Secondary | ICD-10-CM | POA: Insufficient documentation

## 2018-10-28 DIAGNOSIS — E119 Type 2 diabetes mellitus without complications: Secondary | ICD-10-CM | POA: Insufficient documentation

## 2018-10-28 DIAGNOSIS — I10 Essential (primary) hypertension: Secondary | ICD-10-CM | POA: Diagnosis not present

## 2018-10-28 DIAGNOSIS — Y9389 Activity, other specified: Secondary | ICD-10-CM | POA: Diagnosis not present

## 2018-10-28 DIAGNOSIS — Y999 Unspecified external cause status: Secondary | ICD-10-CM | POA: Diagnosis not present

## 2018-10-28 DIAGNOSIS — Y92410 Unspecified street and highway as the place of occurrence of the external cause: Secondary | ICD-10-CM | POA: Insufficient documentation

## 2018-10-28 DIAGNOSIS — Z79899 Other long term (current) drug therapy: Secondary | ICD-10-CM | POA: Diagnosis not present

## 2018-10-28 DIAGNOSIS — R519 Headache, unspecified: Secondary | ICD-10-CM | POA: Diagnosis not present

## 2018-10-28 DIAGNOSIS — M542 Cervicalgia: Secondary | ICD-10-CM | POA: Insufficient documentation

## 2018-10-28 MED ORDER — METHOCARBAMOL 500 MG PO TABS: 500.0000 mg | ORAL_TABLET | Freq: Two times a day (BID) | ORAL | 0 refills | Status: DC

## 2018-10-28 NOTE — Discharge Instructions (Signed)
Return if any problems.
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
## 2018-10-30 ENCOUNTER — Ambulatory Visit: Payer: Self-pay | Admitting: Internal Medicine

## 2018-10-30 ENCOUNTER — Other Ambulatory Visit (INDEPENDENT_AMBULATORY_CARE_PROVIDER_SITE_OTHER): Payer: Self-pay

## 2018-10-30 ENCOUNTER — Other Ambulatory Visit: Payer: Self-pay

## 2018-10-30 ENCOUNTER — Encounter: Payer: Self-pay | Admitting: Internal Medicine

## 2018-10-30 ENCOUNTER — Ambulatory Visit (INDEPENDENT_AMBULATORY_CARE_PROVIDER_SITE_OTHER): Payer: Self-pay | Admitting: Internal Medicine

## 2018-10-30 VITALS — BP 102/78 | HR 70 | Temp 97.0°F | Ht 76.0 in | Wt 187.0 lb

## 2018-10-30 DIAGNOSIS — K5903 Drug induced constipation: Secondary | ICD-10-CM | POA: Insufficient documentation

## 2018-10-30 DIAGNOSIS — E119 Type 2 diabetes mellitus without complications: Secondary | ICD-10-CM

## 2018-10-30 DIAGNOSIS — T402X5A Adverse effect of other opioids, initial encounter: Secondary | ICD-10-CM | POA: Insufficient documentation

## 2018-10-30 DIAGNOSIS — K601 Chronic anal fissure: Secondary | ICD-10-CM

## 2018-10-30 DIAGNOSIS — E785 Hyperlipidemia, unspecified: Secondary | ICD-10-CM

## 2018-10-30 MED ORDER — AMBULATORY NON FORMULARY MEDICATION: 2 refills | Status: DC

## 2018-10-30 NOTE — Patient Instructions (Addendum)
Stop your Diltiazem.   Take Miralax daily.  Do Sitz bathes daily. Handout provided.  [facility]'s information is below: Address: [address] [place], [place] [address]  [phone]  *Please DO NOT go directly from our office to pick up this medication! Give the pharmacy 1 day to process the prescription as this is compounded and takes time to make.    Follow up with Dr Carlean Purl in early December.   I appreciate the opportunity to care for you. Silvano Rusk, MD, [facility]

## 2018-10-30 NOTE — Assessment & Plan Note (Signed)
Reviewed nature of anal fissure today.  Continue stool softener add daily MiraLAX.  Sitz bath as needed.  Continue topical vasodilator but use diltiazem 2%/lidocaine 5% 50-50 mix twice daily at least.  Return in early December.  If this fails to work consider Botox injection.

## 2018-10-30 NOTE — Progress Notes (Signed)
Cameron Cardenas 63 y.o. 28-Apr-1955 [id number]  Assessment & Plan:   Chronic posterior anal fissure Reviewed nature of anal fissure today.  Continue stool softener add daily MiraLAX.  Sitz bath as needed.  Continue topical vasodilator but use diltiazem 2%/lidocaine 5% 50-50 mix twice daily at least.  Return in early December.  If this fails to work consider Botox injection.  Constipation due to opioid therapy Add MiraLAX.  Consider other agents depending upon clinical course.    Subjective:   Chief Complaint: hemorrhoids and anal fissure  HPI Patient is here for follow-up of anal fissure and constipation.  He was diagnosed with hemorrhoids and anal fissure recently and treated with diltiazem 2% gel by Tye Savoy, NP.  He became more constipated last week and I treated him with MiraLAX purge which was successful.  He was quite uncomfortable in the abdomen and rectal area.  He is still having pain during and after defecation at times with sitting but this is better on the diltiazem he feels like he is making progress.  Some rectal bleeding.  He is not doing sitz baths.  He is taking a stool softener. No Known Allergies Current Meds  Medication Sig  . aspirin 81 MG tablet Take 81 mg by mouth daily.  Marland Kitchen diltiazem 2 % GEL Apply 1 application topically 3 (three) times daily. Use three times daily as directed for 6 weeks.  Marland Kitchen ezetimibe (ZETIA) 10 MG tablet Take 1 tablet by mouth once daily  . fluticasone (FLONASE) 50 MCG/ACT nasal spray USE 2 SPRAY(S) IN EACH NOSTRIL ONCE DAILY  . Lidocaine, Anorectal, (RECTICARE) 5 % CREA Apply topically as needed.  Marland Kitchen lisinopril-hydrochlorothiazide (ZESTORETIC) 10-12.5 MG tablet Take 1 tablet by mouth once daily  . methocarbamol (ROBAXIN) 500 MG tablet Take 1 tablet (500 mg total) by mouth 2 (two) times daily.  Derrill Memo ON 12/13/2018] oxyCODONE-acetaminophen (PERCOCET) 10-325 MG tablet Take 1 tablet by mouth every 6 (six) hours as needed for pain.  Marland Kitchen  oxyCODONE-acetaminophen (PERCOCET) 10-325 MG tablet Take 1 tablet by mouth every 6 (six) hours as needed for pain.  . pioglitazone-metformin (ACTOPLUS MET) 15-500 MG tablet Take 1 tablet by mouth twice daily  . rosuvastatin (CRESTOR) 20 MG tablet Take 1 tablet by mouth once daily  . [DISCONTINUED] Continuous Blood Gluc Sensor (FREESTYLE LIBRE 14 DAY SENSOR) MISC 1 Units by Does not apply route every 14 (fourteen) days.   Past Medical History:  Diagnosis Date  . Allergic rhinitis due to pollen   . Bilateral sciatica 07/09/2018  . Chronic pain syndrome 07/09/2018  . Chronic, continuous use of opioids 07/09/2018  . DDD (degenerative disc disease), lumbar   . Essential hypertension, malignant   . Hemorrhoids   . Lumbago   . Lumbar post-laminectomy syndrome 07/09/2018  . Plantar fasciitis of left foot   . Pure hypercholesterolemia   . Spinal stenosis of lumbar region 07/09/2018  . Type II or unspecified type diabetes mellitus without mention of complication, not stated as uncontrolled    dx in 2005  . Unspecified vitamin D deficiency    Past Surgical History:  Procedure Laterality Date  . COLONOSCOPY     in [facility], MD no longer in practice, does not recall the name of facility. Does belive polyps were removed  . LUMBAR DISC SURGERY  2011   total of 3 sx on back per pt  . MAXIMUM ACCESS (MAS)POSTERIOR LUMBAR INTERBODY FUSION (PLIF) 1 LEVEL N/A 06/10/2014   Procedure: Redo Lumbar Five-Sacral One  Decompression with maximum access posterior lumbar interbody fusion;  Surgeon: Erline Levine, MD;  Location: [place] [facility];  Service: Neurosurgery;  Laterality: N/A;  Redo Decompression with maximum access posterior lumbar interbody fusion, L5-S1   Social History   Social History Narrative   Lives with wife in a one story home.  Has one child.     He is on disability since January 2020, stopped working in February 2019 for low back pain.  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     Education: high school.     family history  includes Cataracts in his sister; Diabetes in his mother and sister; Hypertension in his father and mother; Kidney disease in his mother; Seizures in his brother.   Review of Systems As above  Objective:   Physical Exam  BP 102/78   Pulse 70   Temp (!) 97 F (36.1 C)   Ht '6\' 4"'$  (1.93 m)   Wt 187 lb (84.8 kg)   SpO2 98%   BMI 22.76 kg/m  NAD Eyes anicteric  Rectal exam shows a posterior sentinel pile and fissure.  He is tender in this area.  There is no sign of abscess or other complication.  Minimal digital exam done due to pain.

## 2018-10-30 NOTE — Assessment & Plan Note (Signed)
Add MiraLAX.  Consider other agents depending upon clinical course.

## 2018-10-31 LAB — LIPID PANEL
Cholesterol: 155 mg/dL (ref 0–200)
HDL: 69.4 mg/dL (ref >39.00)
LDL Cholesterol: 72 mg/dL (ref 0–99)
NonHDL: 85.21
Total CHOL/HDL Ratio: 2
Triglycerides: 64 mg/dL (ref 0.0–149.0)
VLDL: 12.8 mg/dL (ref 0.0–40.0)

## 2018-10-31 LAB — COMPREHENSIVE METABOLIC PANEL
ALT: 42 U/L (ref 0–53)
AST: 26 U/L (ref 0–37)
Albumin: 4.1 g/dL (ref 3.5–5.2)
Alkaline Phosphatase: 40 U/L (ref 39–117)
BUN: 18 mg/dL (ref 6–23)
CO2: 31 mEq/L (ref 19–32)
Calcium: 9 mg/dL (ref 8.4–10.5)
Chloride: 105 mEq/L (ref 96–112)
Creatinine, Ser: 1.31 mg/dL (ref 0.40–1.50)
GFR: 66.6 mL/min (ref >60.00)
Glucose, Bld: 93 mg/dL (ref 70–99)
Potassium: 4 mEq/L (ref 3.5–5.1)
Sodium: 141 mEq/L (ref 135–145)
Total Bilirubin: 0.5 mg/dL (ref 0.2–1.2)
Total Protein: 6.2 g/dL (ref 6.0–8.3)

## 2018-10-31 LAB — HEMOGLOBIN A1C: Hgb A1c MFr Bld: 5.8 % (ref 4.6–6.5)

## 2018-11-01 NOTE — Progress Notes (Signed)
Patient ID: Cameron Cardenas, male   DOB: 12/21/1955, 63 y.o.   MRN: [id number]   Reason for Appointment: Endocrinology follow-up    Today's office visit was provided via telemedicine using video technique Explained to the patient and the the limitations of evaluation and management by telemedicine and the availability of in person appointments.  The patient understood the limitations and agreed to proceed. Patient also understood that the telehealth visit is billable. . Location of the patient: Home . Location of the provider: Office Only the patient and myself were participating in the encounter   History of Present Illness   Diagnosis: Type 2 DIABETES MELITUS, date of diagnosis: 2004  PAST history: He had mild diabetes at onset and was treated with metformin and subsequently Actoplusmet In 2013 he had changed his diet significantly and started exercising. This helped him with weight loss Subsequently his blood sugars have been excellent with A1c upper normal  RECENT history:   Oral hypoglycemic drugs: Actoplusmet 15/500 twice a day   He is returning for his 6 month follow-up   His A1c is slightly better at 5.8, previously was at 6%  Current management, problems and blood sugar patterns:  He has not checked his sugars much at all and only a few fasting readings, recent numbers 113-121  Previously had done some readings after meals also  On his last visit he had started gaining weight but he is currently down below 190 pounds, this is better than last year also  He is usually trying to watch his diet with avoiding high-fat foods especially meats  His back pain has been better and he is trying to walk, may be able to walk 8 miles a week  Lab fasting glucose was 93  No side effects like GI disturbance or edema with Actoplusmet which he takes regularly             Side effects from medications: None  Monitors blood glucose every other day: Using meter: One  Touch Blood sugars as above  Previous readings:  PRE-MEAL Fasting Lunch Dinner Bedtime Overall  Glucose range:       Mean/median:      128   POST-MEAL PC Breakfast PC Lunch PC Dinner  Glucose range:  118-133  127  124-137  Mean/median:       Dietician visit: Most recent: 12/13               Wt Readings from Last 3 Encounters:  10/30/18 187 lb (84.8 kg)  10/02/18 183 lb (83 kg)  08/17/18 183 lb 12.8 oz (83.4 kg)   Lab Results  Component Value Date   HGBA1C 5.8 10/30/2018   HGBA1C 6.0 04/25/2018   HGBA1C 6.0 10/16/2017   Lab Results  Component Value Date   MICROALBUR 0.7 07/12/2016   LDLCALC 72 10/30/2018   CREATININE 1.31 10/30/2018    Lab on 10/30/2018  Component Date Value Ref Range Status  . Cholesterol 10/30/2018 155  0 - 200 mg/dL Final   ATP III Classification       Desirable:  < 200 mg/dL               Borderline High:  200 - 239 mg/dL          High:  > = 240 mg/dL  . Triglycerides 10/30/2018 64.0  0.0 - 149.0 mg/dL Final   Normal:  <150 mg/dLBorderline High:  150 - 199 mg/dL  . HDL 10/30/2018 69.40  >39.00 mg/dL  Final  . VLDL 10/30/2018 12.8  0.0 - 40.0 mg/dL Final  . LDL Cholesterol 10/30/2018 72  0 - 99 mg/dL Final  . Total CHOL/HDL Ratio 10/30/2018 2   Final                  Men          Women1/2 Average Risk     3.4          3.3Average Risk          5.0          4.42X Average Risk          9.6          7.13X Average Risk          15.0          11.0                      . NonHDL 10/30/2018 85.21   Final   NOTE:  Non-HDL goal should be 30 mg/dL higher than patient's LDL goal (i.e. LDL goal of < 70 mg/dL, would have non-HDL goal of < 100 mg/dL)  . Sodium 10/30/2018 141  135 - 145 mEq/L Final  . Potassium 10/30/2018 4.0  3.5 - 5.1 mEq/L Final  . Chloride 10/30/2018 105  96 - 112 mEq/L Final  . CO2 10/30/2018 31  19 - 32 mEq/L Final  . Glucose, Bld 10/30/2018 93  70 - 99 mg/dL Final  . BUN 10/30/2018 18  6 - 23 mg/dL Final  . Creatinine, Ser 10/30/2018  1.31  0.40 - 1.50 mg/dL Final  . Total Bilirubin 10/30/2018 0.5  0.2 - 1.2 mg/dL Final  . Alkaline Phosphatase 10/30/2018 40  39 - 117 U/L Final  . AST 10/30/2018 26  0 - 37 U/L Final  . ALT 10/30/2018 42  0 - 53 U/L Final  . Total Protein 10/30/2018 6.2  6.0 - 8.3 g/dL Final  . Albumin 10/30/2018 4.1  3.5 - 5.2 g/dL Final  . Calcium 10/30/2018 9.0  8.4 - 10.5 mg/dL Final  . GFR 10/30/2018 66.60  >60.00 mL/min Final  . Hgb A1c MFr Bld 10/30/2018 5.8  4.6 - 6.5 % Final   Glycemic Control Guidelines for People with Diabetes:Non Diabetic:  <6%Goal of Therapy: <7%Additional Action Suggested:  >8%      Allergies as of 11/02/2018   No Known Allergies     Medication List       Accurate as of November 02, 2018 10:33 AM. If you have any questions, ask your nurse or doctor.        STOP taking these medications   gabapentin 300 MG capsule Commonly known as: Neurontin Stopped by: Elayne Snare, MD   lisinopril-hydrochlorothiazide 10-12.5 MG tablet Commonly known as: ZESTORETIC Stopped by: Elayne Snare, MD   oxyCODONE-acetaminophen 10-325 MG tablet Commonly known as: Percocet Stopped by: Elayne Snare, MD     TAKE these medications   AMBULATORY NON FORMULARY MEDICATION Diltiazem 2% gel mixed with Lidocaine 5% Apply a pea size amount to the rectum Three times a day   aspirin 81 MG tablet Take 81 mg by mouth daily.   diltiazem 2 % Gel Apply 1 application topically 3 (three) times daily. Use three times daily as directed for 6 weeks.   ezetimibe 10 MG tablet Commonly known as: ZETIA Take 1 tablet by mouth once daily   fluticasone 50 MCG/ACT nasal spray Commonly known as: FLONASE USE  2 SPRAY(S) IN EACH NOSTRIL ONCE DAILY   FreeStyle Libre 2 Reader Systm Devi 1 Units by Does not apply route once for 1 dose. Started by: Elayne Snare, MD   FreeStyle Libre 2 Sensor Systm Misc 1 Units by Does not apply route every 14 (fourteen) days. Started by: Elayne Snare, MD   methocarbamol 500 MG  tablet Commonly known as: ROBAXIN Take 1 tablet (500 mg total) by mouth 2 (two) times daily.   olmesartan-hydrochlorothiazide 20-12.5 MG tablet Commonly known as: BENICAR HCT Take 1 tablet by mouth daily. Take 1 tablet by mouth once daily.   pioglitazone-metformin 15-500 MG tablet Commonly known as: ACTOPLUS MET Take 1 tablet by mouth twice daily   RectiCare 5 % Crea Generic drug: Lidocaine (Anorectal) Apply topically as needed.   rosuvastatin 20 MG tablet Commonly known as: CRESTOR Take 1 tablet by mouth once daily       Allergies:  No Known Allergies  Past Medical History:  Diagnosis Date  . Allergic rhinitis due to pollen   . Bilateral sciatica 07/09/2018  . Chronic pain syndrome 07/09/2018  . Chronic, continuous use of opioids 07/09/2018  . DDD (degenerative disc disease), lumbar   . Essential hypertension, malignant   . Hemorrhoids   . Lumbago   . Lumbar post-laminectomy syndrome 07/09/2018  . Plantar fasciitis of left foot   . Pure hypercholesterolemia   . Spinal stenosis of lumbar region 07/09/2018  . Type II or unspecified type diabetes mellitus without mention of complication, not stated as uncontrolled    dx in 2005  . Unspecified vitamin D deficiency     Past Surgical History:  Procedure Laterality Date  . COLONOSCOPY     in [facility], MD no longer in practice, does not recall the name of facility. Does belive polyps were removed  . LUMBAR DISC SURGERY  2011   total of 3 sx on back per pt  . MAXIMUM ACCESS (MAS)POSTERIOR LUMBAR INTERBODY FUSION (PLIF) 1 LEVEL N/A 06/10/2014   Procedure: Redo Lumbar Five-Sacral One Decompression with maximum access posterior lumbar interbody fusion;  Surgeon: Erline Levine, MD;  Location: [place] [facility];  Service: Neurosurgery;  Laterality: N/A;  Redo Decompression with maximum access posterior lumbar interbody fusion, L5-S1    Family History  Problem Relation Age of Onset  . Hypertension Mother   . Kidney disease Mother   .  Diabetes Mother   . Hypertension Father   . Diabetes Sister   . Cataracts Sister   . Seizures Brother        caused his death  . Colon cancer Neg Hx   . Esophageal cancer Neg Hx   . Rectal cancer Neg Hx   . Stomach cancer Neg Hx   . Heart disease Neg Hx   . Colon polyps Neg Hx     Social History:  reports that he has never smoked. He has never used smokeless tobacco. He reports previous alcohol use. He reports previous drug use. Drug: Marijuana.   Lab on 10/30/2018  Component Date Value Ref Range Status  . Cholesterol 10/30/2018 155  0 - 200 mg/dL Final   ATP III Classification       Desirable:  < 200 mg/dL               Borderline High:  200 - 239 mg/dL          High:  > = 240 mg/dL  . Triglycerides 10/30/2018 64.0  0.0 - 149.0 mg/dL Final  Normal:  <150 mg/dLBorderline High:  150 - 199 mg/dL  . HDL 10/30/2018 69.40  >39.00 mg/dL Final  . VLDL 10/30/2018 12.8  0.0 - 40.0 mg/dL Final  . LDL Cholesterol 10/30/2018 72  0 - 99 mg/dL Final  . Total CHOL/HDL Ratio 10/30/2018 2   Final                  Men          Women1/2 Average Risk     3.4          3.3Average Risk          5.0          4.42X Average Risk          9.6          7.13X Average Risk          15.0          11.0                      . NonHDL 10/30/2018 85.21   Final   NOTE:  Non-HDL goal should be 30 mg/dL higher than patient's LDL goal (i.e. LDL goal of < 70 mg/dL, would have non-HDL goal of < 100 mg/dL)  . Sodium 10/30/2018 141  135 - 145 mEq/L Final  . Potassium 10/30/2018 4.0  3.5 - 5.1 mEq/L Final  . Chloride 10/30/2018 105  96 - 112 mEq/L Final  . CO2 10/30/2018 31  19 - 32 mEq/L Final  . Glucose, Bld 10/30/2018 93  70 - 99 mg/dL Final  . BUN 10/30/2018 18  6 - 23 mg/dL Final  . Creatinine, Ser 10/30/2018 1.31  0.40 - 1.50 mg/dL Final  . Total Bilirubin 10/30/2018 0.5  0.2 - 1.2 mg/dL Final  . Alkaline Phosphatase 10/30/2018 40  39 - 117 U/L Final  . AST 10/30/2018 26  0 - 37 U/L Final  . ALT 10/30/2018 42  0 -  53 U/L Final  . Total Protein 10/30/2018 6.2  6.0 - 8.3 g/dL Final  . Albumin 10/30/2018 4.1  3.5 - 5.2 g/dL Final  . Calcium 10/30/2018 9.0  8.4 - 10.5 mg/dL Final  . GFR 10/30/2018 66.60  >60.00 mL/min Final  . Hgb A1c MFr Bld 10/30/2018 5.8  4.6 - 6.5 % Final   Glycemic Control Guidelines for People with Diabetes:Non Diabetic:  <6%Goal of Therapy: <7%Additional Action Suggested:  >8%     Review of Systems:  HYPERTENSION:   Treated with Benicar HCT, apparently was having cough with lisinopril and this was changed at the urgent care center He is due for follow-up with his PCP  Home blood pressure recently 120/79  BP Readings from Last 3 Encounters:  10/30/18 102/78  10/28/18 93/69  10/02/18 106/60     HYPERLIPIDEMIA: The lipid abnormality consists of elevated LDL  He had been on 40 mg atorvastatin since 12/13 and this was increased to 80 mg in 6/16 Previously did have LDL particle number of 1348 previously and which was relatively high at 1216 in 6/16  With Crestor 20 mg and Zetia daily his LDL is excellent Labs as follows:    Lab Results  Component Value Date   CHOL 155 10/30/2018   CHOL 146 10/16/2017   CHOL 143 10/11/2016   Lab Results  Component Value Date   HDL 69.40 10/30/2018   HDL 79.30 10/16/2017   HDL 66.60 10/11/2016   Lab Results  Component Value  Date   LDLCALC 72 10/30/2018   LDLCALC 59 10/16/2017   LDLCALC 65 10/11/2016   Lab Results  Component Value Date   TRIG 64.0 10/30/2018   TRIG 41.0 10/16/2017   TRIG 57.0 10/11/2016   Lab Results  Component Value Date   CHOLHDL 2 10/30/2018   CHOLHDL 2 10/16/2017   CHOLHDL 2 10/11/2016   No results found for: LDLDIRECT  Lab Results  Component Value Date   CHOL 155 10/30/2018   HDL 69.40 10/30/2018   LDLCALC 72 10/30/2018   TRIG 64.0 10/30/2018   CHOLHDL 2 10/30/2018        He had a foot exam in 10/19    Examination:   There were no vitals taken for this visit.  There is no height or  weight on file to calculate BMI.      ASSESSMENT/ PLAN:   Diabetes type 2 nonobese  He has had mild diabetes with consistently good A1c levels in the normal range  Continues to have excellent control of diabetes with long-term use of Actoplusmet 15/500 twice a day  Has been able to lose weight since his last visit with regular exercise although only doing brisk walking couple of times a week Usually diet has been fairly good Most of his blood sugars are near normal but checked only fasting  He again wants to try the freestyle libre instead of fingersticks and will send the prescription He can request any assistance if he is not able to use this on his own  HYPERLIPIDEMIA with increased LDL particle number in the past LDL is slightly higher but still well below 100 Discussed cutting back on saturated fat from high fat dairy products and meats more consistently   Hypertension: ls controlled with Benicar HCT and also monitoring at home  Follow-up in 6 months  There are no Patient Instructions on file for this visit.  Elayne Snare 11/02/2018, 10:33 AM     Note: This office note was prepared with Dragon voice recognition system technology. Any transcriptional errors that result from this process are unintentional.

## 2018-11-02 ENCOUNTER — Encounter: Payer: Self-pay | Admitting: Endocrinology

## 2018-11-02 ENCOUNTER — Ambulatory Visit (INDEPENDENT_AMBULATORY_CARE_PROVIDER_SITE_OTHER): Payer: BLUE CROSS/BLUE SHIELD | Admitting: Endocrinology

## 2018-11-02 DIAGNOSIS — E119 Type 2 diabetes mellitus without complications: Secondary | ICD-10-CM

## 2018-11-02 DIAGNOSIS — I10 Essential (primary) hypertension: Secondary | ICD-10-CM

## 2018-11-02 DIAGNOSIS — E785 Hyperlipidemia, unspecified: Secondary | ICD-10-CM

## 2018-11-02 MED ORDER — FREESTYLE LIBRE 2 READER SYSTM DEVI: 1.0000 [IU] | Freq: Once | 0 refills | Status: AC

## 2018-11-02 MED ORDER — FREESTYLE LIBRE 2 SENSOR SYSTM MISC: 1.0000 [IU] | 5 refills | Status: DC

## 2018-11-05 ENCOUNTER — Telehealth: Payer: Self-pay

## 2018-11-05 NOTE — Telephone Encounter (Signed)
PA initiated via [email or web address] for [employer] 2 sensor and reader.  Sensor: ALHASSANE SANSON KeyGO:[id number] - Rx #[id number] Need help? Call us at [phone] Status Additional Information Required Drug FreeStyle Libre 2 Sensor Systm Form [employer] [place] Commercial Electronic Request Form (CB) [id number] Your information has been submitted to [place]. [employer] [place] will review the request and fax you a determination directly, typically within 3 business days of your submission once all necessary information is received.  If [employer] [place] has not responded in 3 business days or if you have any questions about your submission, contact [place] at [phone].  Reader: Haskel Khan KeyNance Pew - Rx #[id number] Need help? Call us at [phone] Status Additional Information Required Drug FreeStyle Libre 2 Reader Systm device Form Blue Cross [place] Commercial Electronic Request Form (CB) Original Claim Info Your information has been submitted to [place]. Blue Cross [place] will review the request and fax you a determination directly, typically within 3 business days of your submission once all necessary information is received.  If [employer]  has not responded in 3 business days or if you have any questions about your submission, contact [place] at [phone].

## 2018-11-07 ENCOUNTER — Ambulatory Visit: Payer: BLUE CROSS/BLUE SHIELD | Admitting: Nurse Practitioner

## 2018-11-07 NOTE — Telephone Encounter (Signed)
Please let him know that insurance will not cover freestyle libre and Dexcom is more complicated and not indicated with his type of diabetes

## 2018-11-07 NOTE — Telephone Encounter (Signed)
Received fax from [facility] of [place] stating that the pt's PA for the [facility] 2 has been denied because it does not meet the plans definition of medical necessity.  Preferred is Dexcom G6, but still requries a PA.

## 2018-11-08 ENCOUNTER — Other Ambulatory Visit: Payer: Self-pay

## 2018-11-08 ENCOUNTER — Telehealth: Payer: Self-pay

## 2018-11-08 MED ORDER — SILDENAFIL CITRATE 20 MG PO TABS: ORAL_TABLET | ORAL | 1 refills | Status: DC

## 2018-11-08 NOTE — Telephone Encounter (Signed)
Called pt and gave him MD Message. Pt verbalized understanding.

## 2018-11-08 NOTE — Telephone Encounter (Signed)
Same sildenafil 20 MG tablet, 2-3 prn as directed, 30 tablets, 1 refill.  If he is having headaches with this he will need to discontinue

## 2018-11-08 NOTE — Telephone Encounter (Signed)
Rx sent 

## 2018-11-08 NOTE — Telephone Encounter (Signed)
Pt requested that Dr. Dwyane Dee prescribe Viagra for him.

## 2018-11-19 ENCOUNTER — Other Ambulatory Visit: Payer: Self-pay | Admitting: Endocrinology

## 2018-11-24 ENCOUNTER — Other Ambulatory Visit: Payer: Self-pay | Admitting: Endocrinology

## 2018-11-26 ENCOUNTER — Encounter: Payer: Self-pay | Admitting: Anesthesiology

## 2018-11-26 ENCOUNTER — Other Ambulatory Visit: Payer: Self-pay

## 2018-11-26 ENCOUNTER — Ambulatory Visit: Payer: Self-pay | Attending: Anesthesiology | Admitting: Anesthesiology

## 2018-11-26 DIAGNOSIS — M5431 Sciatica, right side: Secondary | ICD-10-CM

## 2018-11-26 DIAGNOSIS — M961 Postlaminectomy syndrome, not elsewhere classified: Secondary | ICD-10-CM

## 2018-11-26 DIAGNOSIS — G894 Chronic pain syndrome: Secondary | ICD-10-CM

## 2018-11-26 DIAGNOSIS — F1190 Opioid use, unspecified, uncomplicated: Secondary | ICD-10-CM

## 2018-11-26 DIAGNOSIS — M5126 Other intervertebral disc displacement, lumbar region: Secondary | ICD-10-CM

## 2018-11-26 DIAGNOSIS — M48062 Spinal stenosis, lumbar region with neurogenic claudication: Secondary | ICD-10-CM

## 2018-11-26 DIAGNOSIS — M5432 Sciatica, left side: Secondary | ICD-10-CM

## 2018-11-26 MED ORDER — HYDROCODONE-ACETAMINOPHEN 10-325 MG PO TABS: 1.0000 | ORAL_TABLET | Freq: Four times a day (QID) | ORAL | 0 refills | Status: AC | PRN
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
## 2018-11-27 ENCOUNTER — Encounter: Payer: BLUE CROSS/BLUE SHIELD | Admitting: Anesthesiology

## 2018-12-11 ENCOUNTER — Ambulatory Visit: Payer: Self-pay | Admitting: Internal Medicine

## 2018-12-19 ENCOUNTER — Ambulatory Visit: Payer: BLUE CROSS/BLUE SHIELD | Attending: Anesthesiology | Admitting: Anesthesiology

## 2018-12-19 ENCOUNTER — Encounter: Payer: Self-pay | Admitting: Anesthesiology

## 2018-12-19 ENCOUNTER — Other Ambulatory Visit: Payer: Self-pay

## 2018-12-19 DIAGNOSIS — M5126 Other intervertebral disc displacement, lumbar region: Secondary | ICD-10-CM

## 2018-12-19 DIAGNOSIS — M5136 Other intervertebral disc degeneration, lumbar region: Secondary | ICD-10-CM

## 2018-12-19 DIAGNOSIS — M961 Postlaminectomy syndrome, not elsewhere classified: Secondary | ICD-10-CM

## 2018-12-19 DIAGNOSIS — M48062 Spinal stenosis, lumbar region with neurogenic claudication: Secondary | ICD-10-CM | POA: Diagnosis not present

## 2018-12-19 DIAGNOSIS — M5431 Sciatica, right side: Secondary | ICD-10-CM

## 2018-12-19 DIAGNOSIS — F1190 Opioid use, unspecified, uncomplicated: Secondary | ICD-10-CM | POA: Diagnosis not present

## 2018-12-19 DIAGNOSIS — G894 Chronic pain syndrome: Secondary | ICD-10-CM

## 2018-12-19 DIAGNOSIS — M51369 Other intervertebral disc degeneration, lumbar region without mention of lumbar back pain or lower extremity pain: Secondary | ICD-10-CM

## 2018-12-19 DIAGNOSIS — M5432 Sciatica, left side: Secondary | ICD-10-CM

## 2018-12-19 MED ORDER — OXYCODONE-ACETAMINOPHEN 10-325 MG PO TABS: 1.0000 | ORAL_TABLET | Freq: Four times a day (QID) | ORAL | 0 refills | Status: AC | PRN

## 2018-12-19 MED ORDER — OXYCODONE-ACETAMINOPHEN 10-325 MG PO TABS: 1.0000 | ORAL_TABLET | Freq: Four times a day (QID) | ORAL | 0 refills | Status: DC | PRN

## 2018-12-19 NOTE — Progress Notes (Addendum)
Virtual Visit via Video Note  I connected ith Cameron Cardenas on 12/19/18 at  1:45 PM EST by a video enabled telemedicine application and verified that I am speaking with the correct person using two identifiers.  Location: Patient: home  Provider: pain control center   I discussed the limitations of evaluation and management by telemedicine and the availability of in person appointments. The patient expressed understanding and agreed to proceed.  History of Present Illness: I spoke with Cameron Cardenas for his appointment today.  This was a virtual appointment with Administrator.  He reports that his low back pain and lower extremity pain and pain syndrome are stable in nature.  He has recently switched over to hydrocodone 10 mg tablets and these are less effective.  This was done inadvertently as it was a switch from the Percocet which she reports was more effective for him.  He states that he gets less significant relief and with his work schedule he is having more breakthrough pain.  Otherwise the quality and characteristic of the pain is remained stable in nature.  No other changes are reported.  Despite efforts at stretching and strengthening he continues to have breakthrough pain.  He reports no significant relief with previous injection therapy.   Observations/Objective:  Current Outpatient Medications:  .  AMBULATORY NON FORMULARY MEDICATION, Diltiazem 2% gel mixed with Lidocaine 5% Apply a pea size amount to the rectum Three times a day, Disp: 30 g, Rfl: 2 .  aspirin 81 MG tablet, Take 81 mg by mouth daily., Disp: , Rfl:  .  Continuous Blood Gluc Sensor (FREESTYLE LIBRE 2 SENSOR SYSTM) MISC, 1 Units by Does not apply route every 14 (fourteen) days., Disp: 2 each, Rfl: 5 .  diltiazem 2 % GEL, Apply 1 application topically 3 (three) times daily. Use three times daily as directed for 6 weeks., Disp: 30 g, Rfl: 1 .  ezetimibe (ZETIA) 10 MG tablet, Take 1 tablet by mouth once daily,  Disp: 90 tablet, Rfl: 0 .  fluticasone (FLONASE) 50 MCG/ACT nasal spray, USE 2 SPRAY(S) IN EACH NOSTRIL ONCE DAILY, Disp: , Rfl:  .  HYDROcodone-acetaminophen (NORCO) 10-325 MG tablet, Take 1 tablet by mouth every 6 (six) hours as needed for moderate pain or severe pain., Disp: 120 tablet, Rfl: 0 .  [START ON 01/12/2019] HYDROcodone-acetaminophen (NORCO) 10-325 MG tablet, Take 1 tablet by mouth every 6 (six) hours as needed for moderate pain or severe pain., Disp: 120 tablet, Rfl: 0 .  Lidocaine, Anorectal, (RECTICARE) 5 % CREA, Apply topically as needed., Disp: , Rfl:  .  methocarbamol (ROBAXIN) 500 MG tablet, Take 1 tablet (500 mg total) by mouth 2 (two) times daily., Disp: 20 tablet, Rfl: 0 .  olmesartan-hydrochlorothiazide (BENICAR HCT) 20-12.5 MG tablet, Take 1 tablet by mouth daily. Take 1 tablet by mouth once daily., Disp: , Rfl:  .  [START ON 01/12/2019] oxyCODONE-acetaminophen (PERCOCET) 10-325 MG tablet, Take 1 tablet by mouth every 6 (six) hours as needed for pain., Disp: 120 tablet, Rfl: 0 .  [START ON 02/11/2019] oxyCODONE-acetaminophen (PERCOCET) 10-325 MG tablet, Take 1 tablet by mouth every 6 (six) hours as needed for pain., Disp: 120 tablet, Rfl: 0 .  pioglitazone-metformin (ACTOPLUS MET) 15-500 MG tablet, Take 1 tablet by mouth twice daily, Disp: 60 tablet, Rfl: 0 .  rosuvastatin (CRESTOR) 20 MG tablet, Take 1 tablet by mouth once daily, Disp: 90 tablet, Rfl: 1 .  sildenafil (REVATIO) 20 MG tablet, Take 2-3 tablets by mouth as  needed, as directed Discontinue if having headaches., Disp: 30 tablet, Rfl: 1  Assessment and Plan: 1. Herniated lumbar intervertebral disc   2. Spinal stenosis of lumbar region with neurogenic claudication   3. Chronic, continuous use of opioids   4. Bilateral sciatica   5. Lumbar post-laminectomy syndrome   6. Chronic pain syndrome   7. Failed back syndrome of lumbar spine   8. DDD (degenerative disc disease), lumbar   Based on our discussion today upon  review of the [facility] practitioner database information going to change him back to Percocet in 1 month.  He desires to continue with the hydrocodone 10 mg tablets as these were recently filled and secondary to the expense he desires to utilize these before refilling his next prescription which will be dated for January 2 and February 1.  We will schedule him for return to clinic in 2 months at that point.  Otherwise continue with his current therapy with stretching strengthening exercises and continue follow-up with his primary care physicians for his baseline medical care.  Follow Up Instructions:    I discussed the assessment and treatment plan with the patient. The patient was provided an opportunity to ask questions and all were answered. The patient agreed with the plan and demonstrated an understanding of the instructions.   The patient was advised to call back or seek an in-person evaluation if the symptoms worsen or if the condition fails to improve as anticipated.  I provided 20 minutes of non-face-to-face time during this encounter.   Molli Barrows, MD

## 2018-12-20 ENCOUNTER — Other Ambulatory Visit: Payer: Self-pay | Admitting: Endocrinology

## 2018-12-20 NOTE — Addendum Note (Signed)
Addended by: Molli Barrows on: 12/20/2018 08:53 AM   Modules accepted: Level of Service

## 2018-12-24 ENCOUNTER — Other Ambulatory Visit: Payer: Self-pay

## 2018-12-24 ENCOUNTER — Telehealth: Payer: Self-pay

## 2018-12-24 MED ORDER — PIOGLITAZONE HCL-METFORMIN HCL 15-500 MG PO TABS: 1.0000 | ORAL_TABLET | Freq: Two times a day (BID) | ORAL | 2 refills | Status: DC

## 2018-12-24 NOTE — Telephone Encounter (Signed)
Called pt to inform him that after speaking to his insurance company, his coverage has lapsed as of 12/20/18. Pt stated that he now has Medicaid and he would fax a copy of the letter he received. Will attempt to do PA based off info from the letter once it is received provided the letter has neccessary information.

## 2018-12-24 NOTE — Telephone Encounter (Signed)
Patient called in this morning for Medication refill pioglitazone-metformin (ACTOPLUS MET) 15-500 MG tablet.  Spoke with nurse and he was able to send Rx to pharmacy patient called back and wanted to [name] ti know the pharmacy was sending over a PA form to be filled out. He also is out of medication and is asking or needing something to help hold him over until PA is approved

## 2018-12-26 ENCOUNTER — Telehealth: Payer: Self-pay

## 2018-12-26 NOTE — Telephone Encounter (Signed)
Called [employer] call center and initiated PA for Pioglitazone HCL- Metformin HCL15-500mg  tablets taking 1 tablet by mouth twice daily.  Request was approved   PA#:- [id number] Approval good from 12/26/2018 through 12/21/2019. Interactive Caller ID #: [id number]

## 2018-12-27 ENCOUNTER — Telehealth: Payer: Self-pay | Admitting: Anesthesiology

## 2018-12-27 NOTE — Telephone Encounter (Addendum)
Patient has changed insurance to [facility] and his scripts need prior authorization for January.

## 2018-12-27 NOTE — Telephone Encounter (Signed)
Patient notified that we cant do PA until the medications have been taken to pharmacy to fill.

## 2019-01-14 ENCOUNTER — Telehealth: Payer: Self-pay | Admitting: Anesthesiology

## 2019-01-14 NOTE — Telephone Encounter (Signed)
PA done by LPatterson,RNC 

## 2019-01-14 NOTE — Telephone Encounter (Signed)
Patient states pharmacy told him they need prior auth for Oxycodone because its a new year. Please let patient know when he can pick up meds.

## 2019-01-22 ENCOUNTER — Ambulatory Visit: Payer: Medicaid Other | Admitting: Orthopaedic Surgery

## 2019-01-22 ENCOUNTER — Ambulatory Visit (INDEPENDENT_AMBULATORY_CARE_PROVIDER_SITE_OTHER): Payer: Medicaid Other

## 2019-01-22 ENCOUNTER — Ambulatory Visit: Payer: Self-pay

## 2019-01-22 ENCOUNTER — Other Ambulatory Visit: Payer: Self-pay

## 2019-01-22 DIAGNOSIS — G8929 Other chronic pain: Secondary | ICD-10-CM | POA: Diagnosis not present

## 2019-01-22 DIAGNOSIS — M1712 Unilateral primary osteoarthritis, left knee: Secondary | ICD-10-CM

## 2019-01-22 DIAGNOSIS — M25561 Pain in right knee: Secondary | ICD-10-CM

## 2019-01-22 DIAGNOSIS — M1711 Unilateral primary osteoarthritis, right knee: Secondary | ICD-10-CM

## 2019-01-22 MED ORDER — METHYLPREDNISOLONE ACETATE 40 MG/ML IJ SUSP
40.0000 mg | INTRAMUSCULAR | Status: AC | PRN
  Administered 2019-01-22: 40 mg via INTRA_ARTICULAR

## 2019-01-22 MED ORDER — BUPIVACAINE HCL 0.25 % IJ SOLN
2.0000 mL | INTRAMUSCULAR | Status: AC | PRN
  Administered 2019-01-22: 2 mL via INTRA_ARTICULAR

## 2019-01-22 MED ORDER — LIDOCAINE HCL 1 % IJ SOLN
2.0000 mL | INTRAMUSCULAR | Status: AC | PRN
  Administered 2019-01-22: 2 mL

## 2019-01-22 NOTE — Progress Notes (Signed)
Office Visit Note   Patient: Cameron Cardenas           Date of Birth: 1955-01-24           MRN: [id number] Visit Date: 01/22/2019              Requested by: Merrilee Seashore, [address] [place],  [place] [id number] PCP: Merrilee Seashore, MD   Assessment & Plan: Visit Diagnoses:  1. Unilateral primary osteoarthritis, right knee   2. Primary osteoarthritis of left knee   3. Chronic pain of right knee     Plan: Impression is bilateral knee arthritis flareup.  Due to the patient's underlying diabetes we will only inject 1 knee today.  He has elected to proceed with right knee cortisone injection.  We will follow up with [name] in 2 weeks for his left knee injection.  Call with concerns or questions the meantime.  Follow-Up Instructions: Return in about 2 weeks (around 02/05/2019).   Orders:  Orders Placed This Encounter  Procedures  . Large Joint Inj: R knee  . XR KNEE 3 VIEW LEFT  . XR Knee 1-2 Views Right   No orders of the defined types were placed in this encounter.     Procedures: Large Joint Inj: R knee on 01/22/2019 4:26 PM Indications: pain Details: 22 G needle, anterolateral approach Medications: 2 mL lidocaine 1 %; 2 mL bupivacaine 0.25 %; 40 mg methylPREDNISolone acetate 40 MG/ML      Clinical Data: No additional findings.   Subjective: Chief Complaint  Patient presents with  . Right Knee - Pain  . Left Knee - Pain    HPI Cameron Cardenas is a pleasant 64 year old diabetic gentleman who presents our clinic today with bilateral knee pain both equally as bad.  Pain he has been to the entire knee and occasionally radiates to the popliteal fossa.  This has been ongoing for the past few months without any specific injury or change in activity.  He denies any locking or catching.  His pain is exacerbated with ambulation.  He does have occasional pain at rest as well.  He has been using heat with mild relief of symptoms.  He has not tried any oral OTC  pain medication.  No previous cortisone injection either knee.  Review of Systems as detailed in HPI.  All others reviewed and are negative.   Objective: Vital Signs: There were no vitals taken for this visit.  Physical Exam well-developed well-nourished gentleman no acute distress.  Alert and oriented x3.  Ortho Exam examination of both knees reveals no effusion peer range of motion 0 to 115 degrees.  Medial joint line tenderness both sides.  Mild patellofemoral crepitus.  Ligaments are stable.  Is neurovascular intact distally.  Specialty Comments:  No specialty comments available.  Imaging: XR Knee 1-2 Views Right  Result Date: 01/22/2019 Mild to moderate joint space narrowing medial and patellofemoral compartments  XR KNEE 3 VIEW LEFT  Result Date: 01/22/2019 Mild to moderate joint space narrowing medial and patellofemoral compartments    PMFS History: Patient Active Problem List   Diagnosis Date Noted  . 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 Constipation due to opioid therapy 10/30/2018  . Chronic, continuous use of opioids 07/09/2018  . Chronic pain syndrome 07/09/2018  . Spinal stenosis of lumbar region 07/09/2018  . Lumbar post-laminectomy syndrome 07/09/2018  . Bilateral sciatica 07/09/2018  . Short lasting unilateral neuralgiform headache with conjunctival injection and tearing (SUNCT), not intractable  02/01/2018  . Temporal pain 02/01/2018  . Prolapsed internal hemorrhoids, grade 2 11/01/2017  . Palpitations 07/31/2016  . Routine general medical examination at a health care facility 12/09/2015  . Essential hypertension 09/03/2015  . Allergic rhinitis due to pollen 09/03/2015  . ED (erectile dysfunction) of organic origin 07/09/2015  . Leukopenia 02/25/2015  . Hyperlipidemia with target LDL less than 100 07/02/2014  . Herniated lumbar intervertebral disc 06/10/2014  . Type II diabetes mellitus with manifestations ([place]) 08/28/2012   Past Medical  History:  Diagnosis Date  . Allergic rhinitis due to pollen   . Anal fissure   . Bilateral sciatica 07/09/2018  . Chronic pain syndrome 07/09/2018  . Chronic, continuous use of opioids 07/09/2018  . DDD (degenerative disc disease), lumbar   . Essential hypertension, malignant   . Hemorrhoids   . Lumbago   . Lumbar post-laminectomy syndrome 07/09/2018  . Plantar fasciitis of left foot   . Pure hypercholesterolemia   . Spinal stenosis of lumbar region 07/09/2018  . Type II or unspecified type diabetes mellitus without mention of complication, not stated as uncontrolled    dx in 2005  . Unspecified vitamin D deficiency     Family History  Problem Relation Age of Onset  . Hypertension Mother   . Kidney disease Mother   . Diabetes Mother   . Hypertension Father   . Diabetes Sister   . Cataracts Sister   . Seizures Brother        caused his death  . Colon cancer Neg Hx   . Esophageal cancer Neg Hx   . Rectal cancer Neg Hx   . Stomach cancer Neg Hx   . Heart disease Neg Hx   . Colon polyps Neg Hx     Past Surgical History:  Procedure Laterality Date  . COLONOSCOPY     in [facility], MD no longer in practice, does not recall the name of facility. Does belive polyps were removed  . LUMBAR DISC SURGERY  2011   total of 3 sx on back per pt  . MAXIMUM ACCESS (MAS)POSTERIOR LUMBAR INTERBODY FUSION (PLIF) 1 LEVEL N/A 06/10/2014   Procedure: Redo Lumbar Five-Sacral One Decompression with maximum access posterior lumbar interbody fusion;  Surgeon: Erline Levine, MD;  Location: [facility];  Service: Neurosurgery;  Laterality: N/A;  Redo Decompression with maximum access posterior lumbar interbody fusion, L5-S1   Social History   Occupational History  . Occupation: disabled  Tobacco Use  . Smoking status: Never Smoker  . Smokeless tobacco: Never Used  Substance and Sexual Activity  . Alcohol use: Not Currently    Comment: 6 pack beer lasts a month  . Drug use: Not Currently    Types:  Marijuana  . Sexual activity: Not on file

## 2019-01-23 ENCOUNTER — Telehealth: Payer: Self-pay | Admitting: Orthopaedic Surgery

## 2019-01-23 NOTE — Telephone Encounter (Signed)
Dr. Erlinda Hong is talking to him right now

## 2019-01-23 NOTE — Telephone Encounter (Signed)
Called patient and explained everything.  He's fine with everything now.

## 2019-01-23 NOTE — Telephone Encounter (Signed)
Patient called requesting Dr. Erlinda Hong directly call patient back to read x-rays to patient. Patient asked to speak to office manager. Patient was very upset that he seen the PA [facility] and not Dr. Erlinda Hong on yesterday's appointment. Patient stated he would call [place] upper management if nothing is done. Patient wishes only to see Dr. Erlinda Hong when he comes for any and all upcoming  appointments. I calmed patient down and stated to send a high priority message for Dr. Erlinda Hong to see and receive a call back. Please have Dr. Erlinda Hong call this patient with reading of x-rays. Patient phone number is [phone]

## 2019-01-23 NOTE — Telephone Encounter (Signed)
Ok, thanks.

## 2019-02-05 ENCOUNTER — Ambulatory Visit: Payer: Medicaid Other | Admitting: Orthopaedic Surgery

## 2019-02-06 ENCOUNTER — Ambulatory Visit: Payer: Medicaid Other | Admitting: Orthopaedic Surgery

## 2019-02-13 ENCOUNTER — Telehealth: Payer: Self-pay | Admitting: Anesthesiology

## 2019-02-13 ENCOUNTER — Ambulatory Visit: Payer: Medicaid Other | Attending: Anesthesiology | Admitting: Anesthesiology

## 2019-02-13 ENCOUNTER — Encounter: Payer: Self-pay | Admitting: Anesthesiology

## 2019-02-13 ENCOUNTER — Other Ambulatory Visit: Payer: Self-pay

## 2019-02-13 DIAGNOSIS — M48062 Spinal stenosis, lumbar region with neurogenic claudication: Secondary | ICD-10-CM

## 2019-02-13 DIAGNOSIS — M5126 Other intervertebral disc displacement, lumbar region: Secondary | ICD-10-CM | POA: Diagnosis not present

## 2019-02-13 DIAGNOSIS — F1190 Opioid use, unspecified, uncomplicated: Secondary | ICD-10-CM | POA: Diagnosis not present

## 2019-02-13 DIAGNOSIS — G894 Chronic pain syndrome: Secondary | ICD-10-CM

## 2019-02-13 DIAGNOSIS — M51369 Other intervertebral disc degeneration, lumbar region without mention of lumbar back pain or lower extremity pain: Secondary | ICD-10-CM

## 2019-02-13 DIAGNOSIS — M5431 Sciatica, right side: Secondary | ICD-10-CM

## 2019-02-13 DIAGNOSIS — M961 Postlaminectomy syndrome, not elsewhere classified: Secondary | ICD-10-CM

## 2019-02-13 DIAGNOSIS — M5136 Other intervertebral disc degeneration, lumbar region: Secondary | ICD-10-CM

## 2019-02-13 DIAGNOSIS — M5432 Sciatica, left side: Secondary | ICD-10-CM

## 2019-02-13 MED ORDER — OXYCODONE-ACETAMINOPHEN 10-325 MG PO TABS: 1.0000 | ORAL_TABLET | Freq: Four times a day (QID) | ORAL | 0 refills | Status: DC | PRN

## 2019-02-13 MED ORDER — METHOCARBAMOL 500 MG PO TABS: 500.0000 mg | ORAL_TABLET | Freq: Two times a day (BID) | ORAL | 3 refills | Status: AC

## 2019-02-13 NOTE — Telephone Encounter (Signed)
[facility] called stating that we sent over an RX for percocet 10-325mg  and they know he is on this medication long term, but they need to update their notes and records as to why he is on this medication. They need a nurse to call back and tell them his diagnosis and where his pain is and why he needs to be on this medication long term.

## 2019-02-13 NOTE — Progress Notes (Signed)
Virtual Visit via Video Note  I connected with Cameron Cardenas on 02/13/19 at 12:00 PM EST by a video enabled telemedicine application and verified that I am speaking with the correct person using two identifiers.  Location: Patient: Home Provider: Pain control center   I discussed the limitations of evaluation and management by telemedicine and the availability of in person appointments. The patient expressed understanding and agreed to proceed.  History of Present Illness: I spoke with Cameron Cardenas today via video for his virtual conference.  He reports that his back pain has been under decent control.  He is trying to do his stretching exercises but sometimes these aggravate the pain and he is try to get back to more daily walking for core and strength management.  He is taking his medications as prescribed and these continue to work well for him with no evidence of illicit or diverting use or side effect.  He continues to derive good functional lifestyle improvement with the medicines as well.  The quality characteristic and distribution of his back pain and lower leg pain has been stable in nature without change noted.  His strength is been good.    Observations/Objective:  Current Outpatient Medications:  .  AMBULATORY NON FORMULARY MEDICATION, Diltiazem 2% gel mixed with Lidocaine 5% Apply a pea size amount to the rectum Three times a day, Disp: 30 g, Rfl: 2 .  aspirin 81 MG tablet, Take 81 mg by mouth daily., Disp: , Rfl:  .  Continuous Blood Gluc Sensor (FREESTYLE LIBRE 2 SENSOR SYSTM) MISC, 1 Units by Does not apply route every 14 (fourteen) days., Disp: 2 each, Rfl: 5 .  diltiazem 2 % GEL, Apply 1 application topically 3 (three) times daily. Use three times daily as directed for 6 weeks., Disp: 30 g, Rfl: 1 .  ezetimibe (ZETIA) 10 MG tablet, Take 1 tablet by mouth once daily, Disp: 90 tablet, Rfl: 0 .  fluticasone (FLONASE) 50 MCG/ACT nasal spray, USE 2 SPRAY(S) IN EACH NOSTRIL ONCE  DAILY, Disp: , Rfl:  .  Lidocaine, Anorectal, (RECTICARE) 5 % CREA, Apply topically as needed., Disp: , Rfl:  .  methocarbamol (ROBAXIN) 500 MG tablet, Take 1 tablet (500 mg total) by mouth 2 (two) times daily., Disp: 60 tablet, Rfl: 3 .  olmesartan-hydrochlorothiazide (BENICAR HCT) 20-12.5 MG tablet, Take 1 tablet by mouth daily. Take 1 tablet by mouth once daily., Disp: , Rfl:  .  [START ON 03/13/2019] oxyCODONE-acetaminophen (PERCOCET) 10-325 MG tablet, Take 1 tablet by mouth every 6 (six) hours as needed for pain., Disp: 120 tablet, Rfl: 0 .  [START ON 04/12/2019] oxyCODONE-acetaminophen (PERCOCET) 10-325 MG tablet, Take 1 tablet by mouth every 6 (six) hours as needed for pain., Disp: 120 tablet, Rfl: 0 .  pioglitazone-metformin (ACTOPLUS MET) 15-500 MG tablet, Take 1 tablet by mouth 2 (two) times daily with a meal., Disp: 60 tablet, Rfl: 2 .  rosuvastatin (CRESTOR) 20 MG tablet, Take 1 tablet by mouth once daily, Disp: 90 tablet, Rfl: 1 .  sildenafil (REVATIO) 20 MG tablet, Take 2-3 tablets by mouth as needed, as directed Discontinue if having headaches., Disp: 30 tablet, Rfl: 1  Assessment and Plan: 1. Herniated lumbar intervertebral disc   2. Spinal stenosis of lumbar region with neurogenic claudication   3. Chronic, continuous use of opioids   4. Bilateral sciatica   5. Lumbar post-laminectomy syndrome   6. Chronic pain syndrome   7. Failed back syndrome of lumbar spine   8. DDD (  degenerative disc disease), lumbar   Based on our discussion today upon review of the [facility] practitioner database information I think it is appropriate to go ahead and refill his opioid medications for March 3 and April 2.  I will schedule him for a return to clinic in 2 months.  He is to instructed to contact us at the pain control center should he have any problems in the meantime regarding his low back pain or management.  I have also encouraged him to continue follow-up with his primary care physicians  for his baseline medical care.  Follow Up Instructions:    I discussed the assessment and treatment plan with the patient. The patient was provided an opportunity to ask questions and all were answered. The patient agreed with the plan and demonstrated an understanding of the instructions.   The patient was advised to call back or seek an in-person evaluation if the symptoms worsen or if the condition fails to improve as anticipated.  I provided 30 minutes of non-face-to-face time during this encounter.   Molli Barrows, MD

## 2019-02-13 NOTE — Telephone Encounter (Signed)
Spoke with pharmacist, questions answered.

## 2019-02-20 ENCOUNTER — Ambulatory Visit (INDEPENDENT_AMBULATORY_CARE_PROVIDER_SITE_OTHER): Payer: Medicaid Other | Admitting: Orthopaedic Surgery

## 2019-02-20 ENCOUNTER — Other Ambulatory Visit: Payer: Self-pay

## 2019-02-20 DIAGNOSIS — M1711 Unilateral primary osteoarthritis, right knee: Secondary | ICD-10-CM | POA: Diagnosis not present

## 2019-02-20 MED ORDER — LIDOCAINE HCL 1 % IJ SOLN
2.0000 mL | INTRAMUSCULAR | Status: AC | PRN
  Administered 2019-02-20: 15:00:00 2 mL

## 2019-02-20 MED ORDER — BUPIVACAINE HCL 0.5 % IJ SOLN
2.0000 mL | INTRAMUSCULAR | Status: AC | PRN
  Administered 2019-02-20: 2 mL via INTRA_ARTICULAR

## 2019-02-20 MED ORDER — METHYLPREDNISOLONE ACETATE 40 MG/ML IJ SUSP
40.0000 mg | INTRAMUSCULAR | Status: AC | PRN
  Administered 2019-02-20: 40 mg via INTRA_ARTICULAR

## 2019-02-20 NOTE — Progress Notes (Signed)
Office Visit Note   Patient: Cameron Cardenas           Date of Birth: [birth year]           MRN: [id number] Visit Date: 02/20/2019              Requested by: Merrilee Seashore, [address] [place],  [place] [address] PCP: Merrilee Seashore, MD   Assessment & Plan: Visit Diagnoses:  1. Unilateral primary osteoarthritis, right knee     Plan: Right knee cortisone injection performed today.  Patient tolerated well.  He will give his left knee another month to get better from the injection.  We mentioned that if he does not get any significant relief he may have to consider obtaining an MRI.  We will see him back as needed.    Follow-Up Instructions: Return if symptoms worsen or fail to improve.   Orders:  No orders of the defined types were placed in this encounter.  No orders of the defined types were placed in this encounter.     Procedures: Large Joint Inj: R knee on 02/20/2019 3:18 PM Indications: pain Details: 22 G needle  Arthrogram: No  Medications: 40 mg methylPREDNISolone acetate 40 MG/ML; 2 mL lidocaine 1 %; 2 mL bupivacaine 0.5 % Consent was given by the patient. Patient was prepped and draped in the usual sterile fashion.       Clinical Data: No additional findings.   Subjective: Chief Complaint  Patient presents with  . Left Knee - Pain    Kizer comes in today for follow-up of right knee pain.  He is here for an injection.  Left knee injection has helped moderately.  He has not had complete pain relief yet.   Review of Systems  Constitutional: Negative.   All other systems reviewed and are negative.    Objective: Vital Signs: There were no vitals taken for this visit.  Physical Exam Vitals and nursing note reviewed.  Constitutional:      Appearance: He is well-developed.  Pulmonary:     Effort: Pulmonary effort is normal.  Abdominal:     Palpations: Abdomen is soft.  Skin:    General: Skin is warm.   Neurological:     Mental Status: He is alert and oriented to person, place, and time.  Psychiatric:        Behavior: Behavior normal.        Thought Content: Thought content normal.        Judgment: Judgment normal.     Ortho Exam Right knee exam is unchanged.  No joint effusion. Specialty Comments:  No specialty comments available.  Imaging: No results found.   PMFS History: Patient Active Problem List   Diagnosis Date Noted  . 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 Constipation due to opioid therapy 10/30/2018  . Chronic, continuous use of opioids 07/09/2018  . Chronic pain syndrome 07/09/2018  . Spinal stenosis of lumbar region 07/09/2018  . Lumbar post-laminectomy syndrome 07/09/2018  . Bilateral sciatica 07/09/2018  . Short lasting unilateral neuralgiform headache with conjunctival injection and tearing (SUNCT), not intractable 02/01/2018  . Temporal pain 02/01/2018  . Prolapsed internal hemorrhoids, grade 2 11/01/2017  . Palpitations 07/31/2016  . Routine general medical examination at a health care facility 12/09/2015  . Essential hypertension 09/03/2015  . Allergic rhinitis due to pollen 09/03/2015  . ED (erectile dysfunction) of organic origin 07/09/2015  . Leukopenia 02/25/2015  . Hyperlipidemia with target LDL less  than 100 07/02/2014  . Herniated lumbar intervertebral disc 06/10/2014  . Type II diabetes mellitus with manifestations ([facility]) 08/28/2012   Past Medical History:  Diagnosis Date  . Allergic rhinitis due to pollen   . Anal fissure   . Bilateral sciatica 07/09/2018  . Chronic pain syndrome 07/09/2018  . Chronic, continuous use of opioids 07/09/2018  . DDD (degenerative disc disease), lumbar   . Essential hypertension, malignant   . Hemorrhoids   . Lumbago   . Lumbar post-laminectomy syndrome 07/09/2018  . Plantar fasciitis of left foot   . Pure hypercholesterolemia   . Spinal stenosis of lumbar region 07/09/2018  . Type II or unspecified  type diabetes mellitus without mention of complication, not stated as uncontrolled    dx in 2005  . Unspecified vitamin D deficiency     Family History  Problem Relation Age of Onset  . Hypertension Mother   . Kidney disease Mother   . Diabetes Mother   . Hypertension Father   . Diabetes Sister   . Cataracts Sister   . Seizures Brother        caused his death  . Colon cancer Neg Hx   . Esophageal cancer Neg Hx   . Rectal cancer Neg Hx   . Stomach cancer Neg Hx   . Heart disease Neg Hx   . Colon polyps Neg Hx     Past Surgical History:  Procedure Laterality Date  . COLONOSCOPY     in [facility], MD no longer in practice, does not recall the name of facility. Does belive polyps were removed  . LUMBAR DISC SURGERY  2011   total of 3 sx on back per pt  . MAXIMUM ACCESS (MAS)POSTERIOR LUMBAR INTERBODY FUSION (PLIF) 1 LEVEL N/A 06/10/2014   Procedure: Redo Lumbar Five-Sacral One Decompression with maximum access posterior lumbar interbody fusion;  Surgeon: Erline Levine, MD;  Location: [place] [facility];  Service: Neurosurgery;  Laterality: N/A;  Redo Decompression with maximum access posterior lumbar interbody fusion, L5-S1   Social History   Occupational History  . Occupation: disabled  Tobacco Use  . Smoking status: Never Smoker  . Smokeless tobacco: Never Used  Substance and Sexual Activity  . Alcohol use: Not Currently    Comment: 6 pack beer lasts a month  . Drug use: Not Currently    Types: Marijuana  . Sexual activity: Not on file

## 2019-03-12 ENCOUNTER — Other Ambulatory Visit: Payer: Self-pay | Admitting: Interventional Cardiology

## 2019-03-12 DIAGNOSIS — R0789 Other chest pain: Secondary | ICD-10-CM

## 2019-03-13 ENCOUNTER — Telehealth: Payer: Self-pay | Admitting: Anesthesiology

## 2019-03-13 ENCOUNTER — Telehealth: Payer: Self-pay | Admitting: *Deleted

## 2019-03-13 NOTE — Telephone Encounter (Signed)
[place] on [address] Is out of Oxycodone. The [id number] on [address] does have it. Their number is [phone]  [facility] this [facility] so patient can get their meds.

## 2019-03-14 ENCOUNTER — Ambulatory Visit: Payer: Medicaid Other | Attending: Internal Medicine

## 2019-03-14 DIAGNOSIS — Z23 Encounter for immunization: Secondary | ICD-10-CM | POA: Insufficient documentation

## 2019-03-14 MED ORDER — OXYCODONE-ACETAMINOPHEN 10-325 MG PO TABS: 1.0000 | ORAL_TABLET | Freq: Four times a day (QID) | ORAL | 0 refills | Status: AC | PRN

## 2019-03-14 MED ORDER — OXYCODONE-ACETAMINOPHEN 10-325 MG PO TABS: 1.0000 | ORAL_TABLET | Freq: Four times a day (QID) | ORAL | 0 refills | Status: DC | PRN

## 2019-03-14 NOTE — Telephone Encounter (Signed)
[facility] [address] called and ask if we could send Rx that was to be filled on yesterday to [facility] on [address] which does have the oxycodone - apap 10-325 in stock.  They are going to cancel Rx that was sent to them for this month.

## 2019-03-14 NOTE — Progress Notes (Deleted)
Cardiology Office Note:    Date:  03/14/2019   ID:  Cameron Cardenas, DOB 1955/08/07, MRN [id number]  PCP:  Merrilee Seashore, MD  Cardiologist:  Sinclair Grooms, MD   Referring MD: Merrilee Seashore, MD   No chief complaint on file.   History of Present Illness:    Cameron Cardenas is a 64 y.o. male with a hx of hyperlipidemia, type 2 diabetes, essential hypertension and abnormal EKG with bifascicular block.  He was noted to have had episodes of racing heart and wore a 30-day monitor which did not demonstrate any significant abnormality.  He has had complaints of some atypical chest pain.  He had a low risk stress test in 05/2018.  Coronary CTA on 07/10/2018 showed minimal nonobstructive CAD with calcium score of 14 which is at the 35th percentile for age and sex matched control.  ***  Past Medical History:  Diagnosis Date  . Allergic rhinitis due to pollen   . Anal fissure   . Bilateral sciatica 07/09/2018  . Chronic pain syndrome 07/09/2018  . Chronic, continuous use of opioids 07/09/2018  . DDD (degenerative disc disease), lumbar   . Essential hypertension, malignant   . Hemorrhoids   . Lumbago   . Lumbar post-laminectomy syndrome 07/09/2018  . Plantar fasciitis of left foot   . Pure hypercholesterolemia   . Spinal stenosis of lumbar region 07/09/2018  . Type II or unspecified type diabetes mellitus without mention of complication, not stated as uncontrolled    dx in 2005  . Unspecified vitamin D deficiency     Past Surgical History:  Procedure Laterality Date  . COLONOSCOPY     in [facility], MD no longer in practice, does not recall the name of facility. Does belive polyps were removed  . LUMBAR DISC SURGERY  2011   total of 3 sx on back per pt  . MAXIMUM ACCESS (MAS)POSTERIOR LUMBAR INTERBODY FUSION (PLIF) 1 LEVEL N/A 06/10/2014   Procedure: Redo Lumbar Five-Sacral One Decompression with maximum access posterior lumbar interbody fusion;  Surgeon: Erline Levine, MD;  Location:  [place] [facility];  Service: Neurosurgery;  Laterality: N/A;  Redo Decompression with maximum access posterior lumbar interbody fusion, L5-S1    Current Medications: No outpatient medications have been marked as taking for the 03/15/19 encounter (Appointment) with Belva Crome, MD.     Allergies:   Patient has no known allergies.   Social History   Socioeconomic History  . Marital status: Married    Spouse name: Not on file  . Number of children: 1  . Years of education: 47  . Highest education level: High school graduate  Occupational History  . Occupation: disabled  Tobacco Use  . Smoking status: Never Smoker  . Smokeless tobacco: Never Used  Substance and Sexual Activity  . Alcohol use: Not Currently    Comment: 6 pack beer lasts a month  . Drug use: Not Currently    Types: Marijuana  . Sexual activity: Not on file  Other Topics Concern  . Not on file  Social History Narrative   Lives with wife in a one story home.  Has one child.     He is on disability since January 2020, stopped working in February 2019 for low back pain.  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     Education: high school.     Social Determinants of Health   Financial Resource Strain:   . Difficulty of Paying Living Expenses: Not on file  Food  Insecurity:   . Worried About Charity fundraiser in the Last Year: Not on file  . Ran Out of Food in the Last Year: Not on file  Transportation Needs:   . Lack of Transportation (Medical): Not on file  . Lack of Transportation (Non-Medical): Not on file  Physical Activity:   . Days of Exercise per Week: Not on file  . Minutes of Exercise per Session: Not on file  Stress:   . Feeling of Stress : Not on file  Social Connections:   . Frequency of Communication with Friends and Family: Not on file  . Frequency of Social Gatherings with Friends and Family: Not on file  . Attends Religious Services: Not on file  . Active Member of Clubs or Organizations: Not on file  . Attends  Archivist Meetings: Not on file  . Marital Status: Not on file     Family History: The patient's family history includes Cataracts in his sister; Diabetes in his mother and sister; Hypertension in his father and mother; Kidney disease in his mother; Seizures in his brother. There is no history of Colon cancer, Esophageal cancer, Rectal cancer, Stomach cancer, Heart disease, or Colon polyps.  ROS:   Please see the history of present illness.    *** All other systems reviewed and are negative.  EKGs/Labs/Other Studies Reviewed:    The following studies were reviewed today: ***  EKG:  EKG ***  Recent Labs: 03/21/2018: TSH 1.50 10/30/2018: ALT 42; BUN 18; Creatinine, Ser 1.31; Potassium 4.0; Sodium 141  Recent Lipid Panel    Component Value Date/Time   CHOL 155 10/30/2018 1548   TRIG 64.0 10/30/2018 1548   HDL 69.40 10/30/2018 1548   CHOLHDL 2 10/30/2018 1548   VLDL 12.8 10/30/2018 1548   LDLCALC 72 10/30/2018 1548    Physical Exam:    VS:  There were no vitals taken for this visit.    Wt Readings from Last 3 Encounters:  10/30/18 187 lb (84.8 kg)  10/02/18 183 lb (83 kg)  08/17/18 183 lb 12.8 oz (83.4 kg)     GEN: ***. No acute distress HEENT: Normal NECK: No JVD. LYMPHATICS: No lymphadenopathy CARDIAC: *** RRR without murmur, gallop, or edema. VASCULAR: *** Normal Pulses. No bruits. RESPIRATORY:  Clear to auscultation without rales, wheezing or rhonchi  ABDOMEN: Soft, non-tender, non-distended, No pulsatile mass, MUSCULOSKELETAL: No deformity  SKIN: Warm and dry NEUROLOGIC:  Alert and oriented x 3 PSYCHIATRIC:  Normal affect   ASSESSMENT:    1. Chest discomfort   2. Bifascicular bundle branch block   3. Hyperlipidemia with target LDL less than 100   4. Essential hypertension   5. Type 2 diabetes mellitus with complication, without long-term current use of insulin ([place])   6. Syncope and collapse   7. Educated about COVID-19 virus infection     PLAN:    In order of problems listed above:  1. ***   Medication Adjustments/Labs and Tests Ordered: Current medicines are reviewed at length with the patient today.  Concerns regarding medicines are outlined above.  No orders of the defined types were placed in this encounter.  No orders of the defined types were placed in this encounter.   There are no Patient Instructions on file for this visit.   Signed, Sinclair Grooms, MD  03/14/2019 5:17 PM    [place]

## 2019-03-14 NOTE — Addendum Note (Signed)
Addended by: Molli Barrows on: 03/14/2019 10:43 AM   Modules accepted: Orders

## 2019-03-14 NOTE — Progress Notes (Signed)
   Covid-19 Vaccination Clinic  Name:  BECKHAM DASILVA    MRN: [id number] DOB: December 20, 1955  03/14/2019  Mr. Baldridge was observed post Covid-19 immunization for 15 minutes without incident. He was provided with Vaccine Information Sheet and instruction to access the V-Safe system.   Mr. Bente was instructed to call 911 with any severe reactions post vaccine: Marland Kitchen Difficulty breathing  . Swelling of face and throat  . A fast heartbeat  . A bad rash all over body  . Dizziness and weakness   Immunizations Administered    Name Date Dose VIS Date Route   Pfizer COVID-19 Vaccine 03/14/2019 11:14 AM 0.3 mL 12/21/2018 Intramuscular   Manufacturer: [place]   Lot: [id number]   [place]: [id number]

## 2019-03-15 ENCOUNTER — Ambulatory Visit: Payer: BLUE CROSS/BLUE SHIELD | Admitting: Interventional Cardiology

## 2019-03-24 ENCOUNTER — Other Ambulatory Visit: Payer: Self-pay | Admitting: Endocrinology

## 2019-04-02 ENCOUNTER — Ambulatory Visit: Payer: Medicaid Other | Attending: Internal Medicine

## 2019-04-02 ENCOUNTER — Ambulatory Visit: Payer: Medicaid Other

## 2019-04-02 DIAGNOSIS — Z23 Encounter for immunization: Secondary | ICD-10-CM

## 2019-04-02 NOTE — Progress Notes (Signed)
   Covid-19 Vaccination Clinic  Name:  Cameron Cardenas    MRN: [id number] DOB: March 31, 1955  04/02/2019  [name] was observed post Covid-19 immunization for 15 minutes without incident. He was provided with Vaccine Information Sheet and instruction to access the V-Safe system.   [name] was instructed to call 911 with any severe reactions post vaccine: Marland Kitchen Difficulty breathing  . Swelling of face and throat  . A fast heartbeat  . A bad rash all over body  . Dizziness and weakness   Immunizations Administered    Name Date Dose VIS Date Route   Pfizer COVID-19 Vaccine 04/02/2019  9:33 AM 0.3 mL 12/21/2018 Intramuscular   Manufacturer: [place]   Lot: [id number]   [place]: [id number]

## 2019-04-03 ENCOUNTER — Other Ambulatory Visit: Payer: Self-pay

## 2019-04-03 ENCOUNTER — Ambulatory Visit (INDEPENDENT_AMBULATORY_CARE_PROVIDER_SITE_OTHER): Payer: Medicaid Other | Admitting: Orthopaedic Surgery

## 2019-04-03 ENCOUNTER — Encounter: Payer: Self-pay | Admitting: Orthopaedic Surgery

## 2019-04-03 DIAGNOSIS — M1712 Unilateral primary osteoarthritis, left knee: Secondary | ICD-10-CM

## 2019-04-03 DIAGNOSIS — M1711 Unilateral primary osteoarthritis, right knee: Secondary | ICD-10-CM

## 2019-04-03 NOTE — Progress Notes (Signed)
Office Visit Note   Patient: Cameron Cardenas           Date of Birth: 01-05-1956           MRN: [id number] Visit Date: 04/03/2019              Requested by: Merrilee Seashore, [address] [place],  [place] [address] PCP: Merrilee Seashore, MD   Assessment & Plan: Visit Diagnoses:  1. Primary osteoarthritis of left knee   2. Primary osteoarthritis of right knee     Plan: Impression is chronic bilateral knee pain with temporary relief from cortisone injections.  At this point we will obtain MRI scans to assess for structural abnormalities.  Follow-up after the MRI.  Follow-Up Instructions: Return for 10-14 days to review MRI.   Orders:  No orders of the defined types were placed in this encounter.  No orders of the defined types were placed in this encounter.     Procedures: No procedures performed   Clinical Data: No additional findings.   Subjective: Chief Complaint  Patient presents with  . Left Knee - Pain  . Right Knee - Pain    Cameron Cardenas returns today for follow-up of bilateral knee pain.  He has had cortisone injections in both knees with only temporary relief.  He does go to chronic pain management for his chronic back pain.  No changes in medical history.   Review of Systems  Constitutional: Negative.   All other systems reviewed and are negative.    Objective: Vital Signs: There were no vitals taken for this visit.  Physical Exam Vitals and nursing note reviewed.  Constitutional:      Appearance: He is well-developed.  Pulmonary:     Effort: Pulmonary effort is normal.  Abdominal:     Palpations: Abdomen is soft.  Skin:    General: Skin is warm.  Neurological:     Mental Status: He is alert and oriented to person, place, and time.  Psychiatric:        Behavior: Behavior normal.        Thought Content: Thought content normal.        Judgment: Judgment normal.     Ortho Exam Bilateral knees show painful range of  motion with mild limitation.  Collaterals and cruciates are stable. Specialty Comments:  No specialty comments available.  Imaging: No results found.   PMFS History: Patient Active Problem List   Diagnosis Date Noted  . Primary osteoarthritis of right knee 04/03/2019  . Primary osteoarthritis of left knee 04/03/2019  . 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 Constipation due to opioid therapy 10/30/2018  . Chronic, continuous use of opioids 07/09/2018  . Chronic pain syndrome 07/09/2018  . Spinal stenosis of lumbar region 07/09/2018  . Lumbar post-laminectomy syndrome 07/09/2018  . Bilateral sciatica 07/09/2018  . Short lasting unilateral neuralgiform headache with conjunctival injection and tearing (SUNCT), not intractable 02/01/2018  . Temporal pain 02/01/2018  . Prolapsed internal hemorrhoids, grade 2 11/01/2017  . Palpitations 07/31/2016  . Routine general medical examination at a health care facility 12/09/2015  . Essential hypertension 09/03/2015  . Allergic rhinitis due to pollen 09/03/2015  . ED (erectile dysfunction) of organic origin 07/09/2015  . Leukopenia 02/25/2015  . Hyperlipidemia with target LDL less than 100 07/02/2014  . Herniated lumbar intervertebral disc 06/10/2014  . Type II diabetes mellitus with manifestations ([facility]) 08/28/2012   Past Medical History:  Diagnosis Date  . Allergic rhinitis due  to pollen   . Anal fissure   . Bilateral sciatica 07/09/2018  . Chronic pain syndrome 07/09/2018  . Chronic, continuous use of opioids 07/09/2018  . DDD (degenerative disc disease), lumbar   . Essential hypertension, malignant   . Hemorrhoids   . Lumbago   . Lumbar post-laminectomy syndrome 07/09/2018  . Plantar fasciitis of left foot   . Pure hypercholesterolemia   . Spinal stenosis of lumbar region 07/09/2018  . Type II or unspecified type diabetes mellitus without mention of complication, not stated as uncontrolled    dx in 2005  . Unspecified vitamin D  deficiency     Family History  Problem Relation Age of Onset  . Hypertension Mother   . Kidney disease Mother   . Diabetes Mother   . Hypertension Father   . Diabetes Sister   . Cataracts Sister   . Seizures Brother        caused his death  . Colon cancer Neg Hx   . Esophageal cancer Neg Hx   . Rectal cancer Neg Hx   . Stomach cancer Neg Hx   . Heart disease Neg Hx   . Colon polyps Neg Hx     Past Surgical History:  Procedure Laterality Date  . COLONOSCOPY     in [facility] MD no longer in practice, does not recall the name of facility. Does belive polyps were removed  . LUMBAR DISC SURGERY  2011   total of 3 sx on back per pt  . MAXIMUM ACCESS (MAS)POSTERIOR LUMBAR INTERBODY FUSION (PLIF) 1 LEVEL N/A 06/10/2014   Procedure: Redo Lumbar Five-Sacral One Decompression with maximum access posterior lumbar interbody fusion;  Surgeon: Erline Levine, MD;  Location: [place] [facility];  Service: Neurosurgery;  Laterality: N/A;  Redo Decompression with maximum access posterior lumbar interbody fusion, L5-S1   Social History   Occupational History  . Occupation: disabled  Tobacco Use  . Smoking status: Never Smoker  . Smokeless tobacco: Never Used  Substance and Sexual Activity  . Alcohol use: Not Currently    Comment: 6 pack beer lasts a month  . Drug use: Not Currently    Types: Marijuana  . Sexual activity: Not on file

## 2019-04-07 ENCOUNTER — Other Ambulatory Visit: Payer: Self-pay | Admitting: Endocrinology

## 2019-04-09 ENCOUNTER — Ambulatory Visit: Payer: Medicaid Other | Attending: Anesthesiology | Admitting: Anesthesiology

## 2019-04-09 ENCOUNTER — Other Ambulatory Visit: Payer: Self-pay

## 2019-04-09 ENCOUNTER — Encounter: Payer: Self-pay | Admitting: Anesthesiology

## 2019-04-09 DIAGNOSIS — F1190 Opioid use, unspecified, uncomplicated: Secondary | ICD-10-CM | POA: Diagnosis not present

## 2019-04-09 DIAGNOSIS — M5432 Sciatica, left side: Secondary | ICD-10-CM

## 2019-04-09 DIAGNOSIS — M5431 Sciatica, right side: Secondary | ICD-10-CM | POA: Diagnosis not present

## 2019-04-09 DIAGNOSIS — M48062 Spinal stenosis, lumbar region with neurogenic claudication: Secondary | ICD-10-CM

## 2019-04-09 DIAGNOSIS — M961 Postlaminectomy syndrome, not elsewhere classified: Secondary | ICD-10-CM

## 2019-04-09 DIAGNOSIS — G894 Chronic pain syndrome: Secondary | ICD-10-CM

## 2019-04-09 DIAGNOSIS — M5136 Other intervertebral disc degeneration, lumbar region: Secondary | ICD-10-CM

## 2019-04-09 MED ORDER — OXYCODONE-ACETAMINOPHEN 10-325 MG PO TABS: 1.0000 | ORAL_TABLET | Freq: Four times a day (QID) | ORAL | 0 refills | Status: DC | PRN

## 2019-04-09 NOTE — Progress Notes (Signed)
Virtual Visit via Telephone Note  I connected with Cameron Cardenas on 04/09/19 at 11:30 AM EDT by telephone and verified that I am speaking with the correct person using two identifiers.  Location: Patient: Home Provider: Pain control center   I discussed the limitations, risks, security and privacy concerns of performing an evaluation and management service by telephone and the availability of in person appointments. I also discussed with the patient that there may be a patient responsible charge related to this service. The patient expressed understanding and agreed to proceed.   History of Present Illness: I spoke with [name] today via telephone as he was unable to do the video portion of the virtual conference.  He states that his back pain has been stable.  The quality characteristic and distribution of the pain remain unchanged and primarily affecting his low back with radiation into both legs.  This has been a chronic issue for him.  Unfortunately conservative therapy has failed to give him significant relief but he has done well with the oxycodone.  He takes his 4 times a day and it gives him good relief.  Based on our discussion and questions today he continues to derive good functional lifestyle provement with the medications.  No untoward side effects are reported.  Otherwise he is in his usual state of health.  Bowel and bladder function have been stable and no changes in lower extremity strength or function are noted.   Observations/Objective:  Current Outpatient Medications:  .  AMBULATORY NON FORMULARY MEDICATION, Diltiazem 2% gel mixed with Lidocaine 5% Apply a pea size amount to the rectum Three times a day, Disp: 30 g, Rfl: 2 .  aspirin 81 MG tablet, Take 81 mg by mouth daily., Disp: , Rfl:  .  Continuous Blood Gluc Sensor (FREESTYLE LIBRE 2 SENSOR SYSTM) MISC, 1 Units by Does not apply route every 14 (fourteen) days., Disp: 2 each, Rfl: 5 .  diltiazem 2 % GEL, Apply 1  application topically 3 (three) times daily. Use three times daily as directed for 6 weeks., Disp: 30 g, Rfl: 1 .  ezetimibe (ZETIA) 10 MG tablet, Take 1 tablet by mouth once daily, Disp: 90 tablet, Rfl: 0 .  fluticasone (FLONASE) 50 MCG/ACT nasal spray, USE 2 SPRAY(S) IN EACH NOSTRIL ONCE DAILY, Disp: , Rfl:  .  Lidocaine, Anorectal, (RECTICARE) 5 % CREA, Apply topically as needed., Disp: , Rfl:  .  olmesartan-hydrochlorothiazide (BENICAR HCT) 20-12.5 MG tablet, Take 1 tablet by mouth daily. Take 1 tablet by mouth once daily., Disp: , Rfl:  .  oxyCODONE-acetaminophen (PERCOCET) 10-325 MG tablet, Take 1 tablet by mouth every 6 (six) hours as needed for pain., Disp: 120 tablet, Rfl: 0 .  [START ON 04/12/2019] oxyCODONE-acetaminophen (PERCOCET) 10-325 MG tablet, Take 1 tablet by mouth every 6 (six) hours as needed for pain., Disp: 120 tablet, Rfl: 0 .  pioglitazone-metformin (ACTOPLUS MET) 15-500 MG tablet, Take 1 tablet by mouth 2 (two) times daily with a meal., Disp: 60 tablet, Rfl: 2 .  rosuvastatin (CRESTOR) 20 MG tablet, Take 1 tablet by mouth once daily, Disp: 90 tablet, Rfl: 0 .  sildenafil (REVATIO) 20 MG tablet, Take 2-3 tablets by mouth as needed, as directed Discontinue if having headaches., Disp: 30 tablet, Rfl: 1  Assessment and Plan: 1. Spinal stenosis of lumbar region with neurogenic claudication   2. Chronic, continuous use of opioids   3. Chronic pain syndrome   4. Bilateral sciatica   5. Lumbar post-laminectomy  syndrome   6. DDD (degenerative disc disease), lumbar   Based on discussion today and upon review of the [facility] practitioner database information I am going to refill his medications.  He has an outstanding April 2 prescription and a May 2 prescription is called in today.  He is due for a UDS routine screen as well.  I will schedule him for a 15-monthreturn to clinic.  He is to continue follow-up with his primary care physicians and we have talked about continuing  stretching strengthening exercises and ambulation.  He is to reach out to [place] has any problems with his medication management or pain management control.  Follow Up Instructions:    I discussed the assessment and treatment plan with the patient. The patient was provided an opportunity to ask questions and all were answered. The patient agreed with the plan and demonstrated an understanding of the instructions.   The patient was advised to call back or seek an in-person evaluation if the symptoms worsen or if the condition fails to improve as anticipated.  I provided 30 minutes of non-face-to-face time during this encounter.   [name] MD

## 2019-04-10 ENCOUNTER — Ambulatory Visit: Payer: Medicaid Other

## 2019-04-17 ENCOUNTER — Ambulatory Visit: Payer: Medicaid Other | Admitting: Orthopaedic Surgery

## 2019-04-22 ENCOUNTER — Encounter: Payer: Self-pay | Admitting: Cardiology

## 2019-04-22 ENCOUNTER — Telehealth (INDEPENDENT_AMBULATORY_CARE_PROVIDER_SITE_OTHER): Payer: Medicaid Other | Admitting: Cardiology

## 2019-04-22 ENCOUNTER — Other Ambulatory Visit: Payer: Self-pay

## 2019-04-22 VITALS — Ht 76.0 in | Wt 198.0 lb

## 2019-04-22 DIAGNOSIS — E785 Hyperlipidemia, unspecified: Secondary | ICD-10-CM | POA: Diagnosis not present

## 2019-04-22 DIAGNOSIS — E118 Type 2 diabetes mellitus with unspecified complications: Secondary | ICD-10-CM

## 2019-04-22 DIAGNOSIS — I452 Bifascicular block: Secondary | ICD-10-CM | POA: Diagnosis not present

## 2019-04-22 DIAGNOSIS — I10 Essential (primary) hypertension: Secondary | ICD-10-CM

## 2019-04-22 DIAGNOSIS — R0789 Other chest pain: Secondary | ICD-10-CM

## 2019-04-22 DIAGNOSIS — E119 Type 2 diabetes mellitus without complications: Secondary | ICD-10-CM

## 2019-04-22 DIAGNOSIS — R55 Syncope and collapse: Secondary | ICD-10-CM

## 2019-04-22 NOTE — Progress Notes (Signed)
Virtual Visit via Telephone Note   This visit type was conducted due to national recommendations for restrictions regarding the COVID-19 Pandemic (e.g. social distancing) in an effort to limit this patient's exposure and mitigate transmission in our community.  Due to his co-morbid illnesses, this patient is at least at moderate risk for complications without adequate follow up.  This format is felt to be most appropriate for this patient at this time.  The patient did not have access to video technology/had technical difficulties with video requiring transitioning to audio format only (telephone).  All issues noted in this document were discussed and addressed.  No physical exam could be performed with this format.  Please refer to the patient's chart for his  consent to telehealth for [facility].   The patient was identified using 2 identifiers.  Date:  04/22/2019   ID:  Cameron Cardenas, DOB [birth year], MRN [id number]  Patient Location: Home Provider Location: Office  PCP:  Merrilee Seashore, MD  Cardiologist:  Sinclair Grooms, MD  Electrophysiologist:  None   Evaluation Performed:  Follow-Up Visit  Chief Complaint:  Syncope   History of Present Illness:    Cameron Cardenas is a 64 y.o. male with a past medical history significant for hyperlipidemia, type 2 diabetes, essential hypertension and abnormal EKG with bifascicular block.  He was noted to have had episodes of racing heart and wore a 30-day monitor which did not demonstrate any significant abnormality.  He has had complaints of some atypical chest pain.  He had a low risk stress test in 05/2018.  Coronary CTA on 07/10/2018 showed minimal nonobstructive CAD with calcium score of 14 which is at the 35th percentile for age and sex matched control.  Cameron Cardenas is being seen today for possible syncope. "Pt called to report that on Saturday 08/11/18 he was in the barber chair and he was "dozing" off and then the barber noticed  he was not easily woken up and appeared he had passed out. They got him some water and he walked around with some dizziness but felt better and went home.. he reports that his chest has been "sore"... it hurts with breathing and movement... he denies any further dizziness, sob, headache. He did eat well that morning prior to his hair cut. He denies cough, fever, sob. "  08/17/18 " he tells me that he feels like he fell asleep in the barber chair and when he got up after the incident, he noted left sided chest tightness and he got real hot. His left chest has been sore after, constant.  He is still a little tender to palpation in that area.  He did not note any lightheadedness prior to the incident. He was mildly short of breath but "not that much". He did notice a fast heart beat after the event when he first stood up.  No dizziness, lightheadedness, presyncope or syncope since the episode on Saturday. His left chest is mildy tender to palpation today and he says this is what he felt. "  He is on disability due to back pain s/p surgery X3. He walks 3 times per week for about 45 minutes. He has walked twice since the episode without chest pain/pressure or shortness of breath. Today he feels very well.    Was to have sleep study never did  Today he feels well, no chest pain since he stopped the ETOH and tobacco.  He was congratulated.  no further syncope.  He  had gained to to 220 lbs but has now lost to 198 lbs - I asked if he could lose 10 more lbs.  He is walking 5-6 miles twice per week.   He will continue that and he is eating organic.    He does not want to have sleep study.  We discussed with wt loss and no further acute issues we would hold off.   Last LDL was 72 and HDL 69 , no palpitations no rapid HR.   He has had both pfizer vaccine for COVID and he never had COVID.   The patient does not have symptoms concerning for COVID-19 infection (fever, chills, cough, or new shortness of breath).     Past Medical History:  Diagnosis Date  . Allergic rhinitis due to pollen   . Anal fissure   . Bilateral sciatica 07/09/2018  . Chronic pain syndrome 07/09/2018  . Chronic, continuous use of opioids 07/09/2018  . DDD (degenerative disc disease), lumbar   . Essential hypertension, malignant   . Hemorrhoids   . Lumbago   . Lumbar post-laminectomy syndrome 07/09/2018  . Plantar fasciitis of left foot   . Pure hypercholesterolemia   . Spinal stenosis of lumbar region 07/09/2018  . Type II or unspecified type diabetes mellitus without mention of complication, not stated as uncontrolled    dx in 2005  . Unspecified vitamin D deficiency    Past Surgical History:  Procedure Laterality Date  . COLONOSCOPY     in [facility], MD no longer in practice, does not recall the name of facility. Does belive polyps were removed  . LUMBAR DISC SURGERY  2011   total of 3 sx on back per pt  . MAXIMUM ACCESS (MAS)POSTERIOR LUMBAR INTERBODY FUSION (PLIF) 1 LEVEL N/A 06/10/2014   Procedure: Redo Lumbar Five-Sacral One Decompression with maximum access posterior lumbar interbody fusion;  Surgeon: Erline Levine, MD;  Location: [place] [facility];  Service: Neurosurgery;  Laterality: N/A;  Redo Decompression with maximum access posterior lumbar interbody fusion, L5-S1     Current Meds  Medication Sig  . aspirin 81 MG tablet Take 81 mg by mouth daily.  Marland Kitchen ezetimibe (ZETIA) 10 MG tablet Take 1 tablet by mouth once daily  . fluticasone (FLONASE) 50 MCG/ACT nasal spray USE 2 SPRAY(S) IN EACH NOSTRIL ONCE DAILY  . olmesartan-hydrochlorothiazide (BENICAR HCT) 20-12.5 MG tablet Take 1 tablet by mouth daily. Take 1 tablet by mouth once daily.  Marland Kitchen oxyCODONE-acetaminophen (PERCOCET) 10-325 MG tablet Take 1 tablet by mouth every 6 (six) hours as needed for pain.  . pioglitazone-metformin (ACTOPLUS MET) 15-500 MG tablet Take 1 tablet by mouth 2 (two) times daily with a meal.  . rosuvastatin (CRESTOR) 20 MG tablet Take 1 tablet by mouth once  daily  . sildenafil (REVATIO) 20 MG tablet Take 2-3 tablets by mouth as needed, as directed Discontinue if having headaches.     Allergies:   Patient has no known allergies.   Social History   Tobacco Use  . Smoking status: Former Smoker    Types: Cigars    Quit date: 01/11/2019    Years since quitting: 0.2  . Smokeless tobacco: Never Used  Substance Use Topics  . Alcohol use: Not Currently    Comment: 6 pack beer lasts a month  . Drug use: Not Currently    Types: Marijuana     Family Hx: The patient's family history includes Cataracts in his sister; Diabetes in his mother and sister; Hypertension in his father  and mother; Kidney disease in his mother; Seizures in his brother. There is no history of Colon cancer, Esophageal cancer, Rectal cancer, Stomach cancer, Heart disease, or Colon polyps.  ROS:   Please see the history of present illness.    General:no colds or fevers, no weight changes Skin:no rashes or ulcers HEENT:no blurred vision, no congestion CV:see HPI PUL:see HPI GI:no diarrhea constipation or melena, no indigestion GU:no hematuria, no dysuria MS:no joint pain, no claudication Neuro:no syncope, no lightheadedness Endo:+ diabetes, no thyroid disease  All other systems reviewed and are negative.   Prior CV studies:   The following studies were reviewed today:  Coronary CTA 07/10/2018 IMPRESSION: 1. Coronary calcium score of 14. This was 76 percentile for age and sex matched control. 2. Normal coronary origin with right dominance. 3. Minimal non-obstructive CAD. Continue with risk factor modifications.  CARDIAC EVENT MONITOR 03/2018: Study Highlights   Underlying rhythm is normal sinus rhythm  Normal heart rate range  No significant arrhythmias. Specifically no AV block, atrial fibrillation, or VT.  Occasional PAC  Chest pain does not correlate with rhythm    STRESS NUCLEAR 05/2018: Study Highlights    The left ventricular ejection  fraction is normal (55-65%).  Nuclear stress EF: 58%.  There was no ST segment deviation noted during stress.  The study is normal.  This is a low risk study.  This is a normal Lexiscan nuclear stress test with no evidence for a prior infarct or ischemia. LVEF 58%.      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  all stable.  Recent Labs: 10/30/2018: ALT 42; BUN 18; Creatinine, Ser 1.31; Potassium 4.0; Sodium 141   Recent Lipid Panel Lab Results  Component Value Date/Time   CHOL 155 10/30/2018 03:48 PM   TRIG 64.0 10/30/2018 03:48 PM   HDL 69.40 10/30/2018 03:48 PM   CHOLHDL 2 10/30/2018 03:48 PM   LDLCALC 72 10/30/2018 03:48 PM    Wt Readings from Last 3 Encounters:  04/22/19 198 lb (89.8 kg)  10/30/18 187 lb (84.8 kg)  10/02/18 183 lb (83 kg)     Objective:    Vital Signs:  Ht 6' 4" (1.93 m)   Wt 198 lb (89.8 kg)   BMI 24.10 kg/m    VITAL SIGNS:  reviewed General NAD Neuro answers questions appropriately  Lungs can speak in complete sentences without SOB. Psych pleasant affect   ASSESSMENT & PLAN:    1. Syncope no further episodes.  2. Chest pain resolved since stopping tobacco and ETOH.  He had low risk stress and non obstructive disease on Cardiac CTA. 3. HTN he will monitor, no reading today goal 130/80 or less. 4. Bifascicular BBB - he will have in office visit in 6 months. 5. HLD with LDL 72 at goal.  6. Discussed COVID variant  he has had vaccine.  7. DM-2 per his PCP  COVID-19 Education: The signs and symptoms of COVID-19 were discussed with the patient and how to seek care for testing (follow up with PCP or arrange E-visit).  The importance of social distancing was discussed today.  Time:   Today, I have spent 4.5 minutes with the patient with telehealth technology discussing the above problems.      Medication Adjustments/Labs and Tests Ordered: Current medicines are reviewed at length with the patient today.  Concerns regarding medicines are outlined above.   Tests Ordered: No orders of the defined types were placed in this encounter.   Medication Changes: No orders of the defined types were placed in this encounter.   Follow Up:  In Person in 6 month(s)  Signed, Cecilie Kicks, NP  04/22/2019 11:32 AM    [place]

## 2019-04-26 ENCOUNTER — Ambulatory Visit: Payer: Medicaid Other | Admitting: Internal Medicine

## 2019-05-06 ENCOUNTER — Ambulatory Visit
Admission: RE | Admit: 2019-05-06 | Discharge: 2019-05-06 | Disposition: A | Discharge status: Home / Self Care | Payer: Medicaid Other | Source: Ambulatory Visit | Attending: Orthopaedic Surgery | Admitting: Orthopaedic Surgery

## 2019-05-06 ENCOUNTER — Other Ambulatory Visit: Payer: Self-pay

## 2019-05-06 DIAGNOSIS — M1711 Unilateral primary osteoarthritis, right knee: Secondary | ICD-10-CM

## 2019-05-06 DIAGNOSIS — M1712 Unilateral primary osteoarthritis, left knee: Secondary | ICD-10-CM

## 2019-05-09 ENCOUNTER — Encounter: Payer: Self-pay | Admitting: Orthopaedic Surgery

## 2019-05-09 ENCOUNTER — Ambulatory Visit (INDEPENDENT_AMBULATORY_CARE_PROVIDER_SITE_OTHER): Payer: Medicaid Other | Admitting: Orthopaedic Surgery

## 2019-05-09 ENCOUNTER — Other Ambulatory Visit: Payer: Self-pay

## 2019-05-09 DIAGNOSIS — M1712 Unilateral primary osteoarthritis, left knee: Secondary | ICD-10-CM | POA: Diagnosis not present

## 2019-05-09 DIAGNOSIS — M1711 Unilateral primary osteoarthritis, right knee: Secondary | ICD-10-CM

## 2019-05-09 MED ORDER — MELOXICAM 7.5 MG PO TABS: 7.5000 mg | ORAL_TABLET | Freq: Two times a day (BID) | ORAL | 2 refills | Status: DC | PRN

## 2019-05-09 NOTE — Progress Notes (Signed)
Office Visit Note   Patient: Cameron Cardenas           Date of Birth: 1955-09-07           MRN: [id number] Visit Date: 05/09/2019              Requested by: Merrilee Seashore, [address] [place],  [place] [address] PCP: Merrilee Seashore, MD   Assessment & Plan: Visit Diagnoses:  1. Primary osteoarthritis of left knee   2. Primary osteoarthritis of right knee     Plan: He has chronic aching anterior knee pain.  MRI of both knees consistent with mild OA.  No structural abnormalities of clinical significance.  I recommend Mobic, physical therapy, Voltaren gel, knee brace.  Questions encouraged and answered.  Follow-up as needed.  Follow-Up Instructions: Return if symptoms worsen or fail to improve.   Orders:  Orders Placed This Encounter  Procedures  . Ambulatory referral to Physical Therapy   Meds ordered this encounter  Medications  . meloxicam (MOBIC) 7.5 MG tablet    Sig: Take 1 tablet (7.5 mg total) by mouth 2 (two) times daily as needed for pain.    Dispense:  30 tablet    Refill:  2      Procedures: No procedures performed   Clinical Data: No additional findings.   Subjective: Chief Complaint  Patient presents with  . Left Knee - Pain  . Right Knee - Pain    [name] returns today for MRI review of bilateral knees.  No changes in medical history.   Review of Systems   Objective: Vital Signs: There were no vitals taken for this visit.  Physical Exam  Ortho Exam Bilateral knee exams are unchanged.  No joint effusion. Specialty Comments:  No specialty comments available.  Imaging: No results found.   PMFS History: Patient Active Problem List   Diagnosis Date Noted  . Primary osteoarthritis of right knee 04/03/2019  . Primary osteoarthritis of left knee 04/03/2019  . 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 Constipation due to opioid therapy 10/30/2018  . Chronic, continuous use of opioids 07/09/2018  . Chronic  pain syndrome 07/09/2018  . Spinal stenosis of lumbar region 07/09/2018  . Lumbar post-laminectomy syndrome 07/09/2018  . Bilateral sciatica 07/09/2018  . Short lasting unilateral neuralgiform headache with conjunctival injection and tearing (SUNCT), not intractable 02/01/2018  . Temporal pain 02/01/2018  . Prolapsed internal hemorrhoids, grade 2 11/01/2017  . Palpitations 07/31/2016  . Routine general medical examination at a health care facility 12/09/2015  . Essential hypertension 09/03/2015  . Allergic rhinitis due to pollen 09/03/2015  . ED (erectile dysfunction) of organic origin 07/09/2015  . Leukopenia 02/25/2015  . Hyperlipidemia with target LDL less than 100 07/02/2014  . Herniated lumbar intervertebral disc 06/10/2014  . Type II diabetes mellitus with manifestations ([place]) 08/28/2012   Past Medical History:  Diagnosis Date  . Allergic rhinitis due to pollen   . Anal fissure   . Bilateral sciatica 07/09/2018  . Chronic pain syndrome 07/09/2018  . Chronic, continuous use of opioids 07/09/2018  . DDD (degenerative disc disease), lumbar   . Essential hypertension, malignant   . Hemorrhoids   . Lumbago   . Lumbar post-laminectomy syndrome 07/09/2018  . Plantar fasciitis of left foot   . Pure hypercholesterolemia   . Spinal stenosis of lumbar region 07/09/2018  . Type II or unspecified type diabetes mellitus without mention of complication, not stated as uncontrolled  dx in 2005  . Unspecified vitamin D deficiency     Family History  Problem Relation Age of Onset  . Hypertension Mother   . Kidney disease Mother   . Diabetes Mother   . Hypertension Father   . Diabetes Sister   . Cataracts Sister   . Seizures Brother        caused his death  . Colon cancer Neg Hx   . Esophageal cancer Neg Hx   . Rectal cancer Neg Hx   . Stomach cancer Neg Hx   . Heart disease Neg Hx   . Colon polyps Neg Hx     Past Surgical History:  Procedure Laterality Date  . COLONOSCOPY      in [facility], MD no longer in practice, does not recall the name of facility. Does belive polyps were removed  . LUMBAR DISC SURGERY  2011   total of 3 sx on back per pt  . MAXIMUM ACCESS (MAS)POSTERIOR LUMBAR INTERBODY FUSION (PLIF) 1 LEVEL N/A 06/10/2014   Procedure: Redo Lumbar Five-Sacral One Decompression with maximum access posterior lumbar interbody fusion;  Surgeon: Erline Levine, MD;  Location: [place] [facility];  Service: Neurosurgery;  Laterality: N/A;  Redo Decompression with maximum access posterior lumbar interbody fusion, L5-S1   Social History   Occupational History  . Occupation: disabled  Tobacco Use  . Smoking status: Former Smoker    Types: Cigars    Quit date: 01/11/2019    Years since quitting: 0.3  . Smokeless tobacco: Never Used  Substance and Sexual Activity  . Alcohol use: Not Currently    Comment: 6 pack beer lasts a month  . Drug use: Not Currently    Types: Marijuana  . Sexual activity: Not on file

## 2019-05-16 ENCOUNTER — Other Ambulatory Visit: Payer: Self-pay | Admitting: Endocrinology

## 2019-05-20 ENCOUNTER — Other Ambulatory Visit: Payer: Self-pay

## 2019-05-20 ENCOUNTER — Other Ambulatory Visit (INDEPENDENT_AMBULATORY_CARE_PROVIDER_SITE_OTHER): Payer: Medicaid Other

## 2019-05-20 DIAGNOSIS — E119 Type 2 diabetes mellitus without complications: Secondary | ICD-10-CM

## 2019-05-20 DIAGNOSIS — E785 Hyperlipidemia, unspecified: Secondary | ICD-10-CM | POA: Diagnosis not present

## 2019-05-20 LAB — LIPID PANEL
Cholesterol: 136 mg/dL (ref 0–200)
HDL: 65.1 mg/dL (ref >39.00)
LDL Cholesterol: 58 mg/dL (ref 0–99)
NonHDL: 71.2
Total CHOL/HDL Ratio: 2
Triglycerides: 66 mg/dL (ref 0.0–149.0)
VLDL: 13.2 mg/dL (ref 0.0–40.0)

## 2019-05-20 LAB — COMPREHENSIVE METABOLIC PANEL
ALT: 37 U/L (ref 0–53)
AST: 28 U/L (ref 0–37)
Albumin: 4 g/dL (ref 3.5–5.2)
Alkaline Phosphatase: 42 U/L (ref 39–117)
BUN: 17 mg/dL (ref 6–23)
CO2: 31 mEq/L (ref 19–32)
Calcium: 9.1 mg/dL (ref 8.4–10.5)
Chloride: 105 mEq/L (ref 96–112)
Creatinine, Ser: 1.09 mg/dL (ref 0.40–1.50)
GFR: 82.2 mL/min (ref >60.00)
Glucose, Bld: 151 mg/dL — ABNORMAL HIGH (ref 70–99)
Potassium: 4.3 mEq/L (ref 3.5–5.1)
Sodium: 140 mEq/L (ref 135–145)
Total Bilirubin: 0.9 mg/dL (ref 0.2–1.2)
Total Protein: 6.1 g/dL (ref 6.0–8.3)

## 2019-05-20 LAB — MICROALBUMIN / CREATININE URINE RATIO
Creatinine,U: 218 mg/dL
Microalb Creat Ratio: 0.8 mg/g (ref 0.0–30.0)
Microalb, Ur: 1.7 mg/dL (ref 0.0–1.9)

## 2019-05-20 LAB — HEMOGLOBIN A1C: Hgb A1c MFr Bld: 6.1 % (ref 4.6–6.5)

## 2019-05-22 ENCOUNTER — Other Ambulatory Visit: Payer: Self-pay

## 2019-05-22 ENCOUNTER — Ambulatory Visit: Payer: Medicaid Other | Attending: Orthopaedic Surgery

## 2019-05-22 DIAGNOSIS — R2689 Other abnormalities of gait and mobility: Secondary | ICD-10-CM

## 2019-05-22 DIAGNOSIS — M25561 Pain in right knee: Secondary | ICD-10-CM | POA: Insufficient documentation

## 2019-05-22 DIAGNOSIS — G8929 Other chronic pain: Secondary | ICD-10-CM | POA: Diagnosis present

## 2019-05-22 DIAGNOSIS — M25562 Pain in left knee: Secondary | ICD-10-CM

## 2019-05-22 DIAGNOSIS — R600 Localized edema: Secondary | ICD-10-CM | POA: Insufficient documentation

## 2019-05-22 DIAGNOSIS — M6281 Muscle weakness (generalized): Secondary | ICD-10-CM | POA: Insufficient documentation

## 2019-05-22 DIAGNOSIS — R262 Difficulty in walking, not elsewhere classified: Secondary | ICD-10-CM | POA: Insufficient documentation

## 2019-05-23 ENCOUNTER — Encounter: Payer: Self-pay | Admitting: Endocrinology

## 2019-05-23 ENCOUNTER — Telehealth (INDEPENDENT_AMBULATORY_CARE_PROVIDER_SITE_OTHER): Payer: Medicaid Other | Admitting: Endocrinology

## 2019-05-23 DIAGNOSIS — E119 Type 2 diabetes mellitus without complications: Secondary | ICD-10-CM | POA: Diagnosis not present

## 2019-05-23 DIAGNOSIS — I10 Essential (primary) hypertension: Secondary | ICD-10-CM

## 2019-05-23 DIAGNOSIS — E785 Hyperlipidemia, unspecified: Secondary | ICD-10-CM | POA: Diagnosis not present

## 2019-05-23 MED ORDER — PIOGLITAZONE HCL-METFORMIN HCL 15-850 MG PO TABS
1.0000 | ORAL_TABLET | Freq: Two times a day (BID) | ORAL | 1 refills | Status: DC
Start: 2019-05-23 — End: 2019-06-04

## 2019-05-23 NOTE — Progress Notes (Signed)
Patient ID: Cameron Cardenas, male   DOB: 23-Aug-1955, 64 y.o.   MRN: [id number]   Reason for Appointment: Endocrinology follow-up   I connected with the above-named patient by video enabled telemedicine application and verified that I am speaking with the correct person. The patient was explained the limitations of evaluation and management by telemedicine and the availability of in person appointments.  Patient also understood that there may be a patient responsible charge related to this service . Location of the patient: Patient's home . Location of the provider: Physician office Only the patient and myself were participating in the encounter The patient understood the above statements and agreed to proceed.   History of Present Illness   Diagnosis: Type 2 DIABETES MELITUS, date of diagnosis: 2004  PAST history: He had mild diabetes at onset and was treated with metformin and subsequently Actoplusmet In 2013 he had changed his diet significantly and started exercising. This helped him with weight loss Subsequently his blood sugars have been excellent with A1c upper normal  RECENT history:   Oral hypoglycemic drugs: Actoplusmet 15/500 twice a day   He is returning for his 6 month follow-up   His A1c is slightly higher at 6.1 compared to 5.8  Current management, problems and blood sugar patterns:  He has not checked his sugars after meals and only checking in the mornings  He is again asking about doing the freestyle libre  Over the last few months he says he had gained a lot of weight from eating sweets and cooking more but now he is trying to work on his weight loss  He is trying to walk intermittently but recently had to decrease because of knee pain  Overall trying to watch his diet better and sometimes will have a smoothie in the morning  Lab glucose was 151 nonfasting in the morning             Side effects from medications: None  Monitors blood glucose  every other day: Using meter: One Touch Blood sugars from patient review  PRE-MEAL Fasting Lunch Dinner Bedtime Overall  Glucose range:  127-138   ?   Mean/median:         Previous readings:  POST-MEAL PC Breakfast PC Lunch PC Dinner  Glucose range:  118-133  127  124-137  Mean/median:       Dietician visit: Most recent: 12/13               Wt Readings from Last 3 Encounters:  04/22/19 198 lb (89.8 kg)  10/30/18 187 lb (84.8 kg)  10/02/18 183 lb (83 kg)   Lab Results  Component Value Date   HGBA1C 6.1 05/20/2019   HGBA1C 5.8 10/30/2018   HGBA1C 6.0 04/25/2018   Lab Results  Component Value Date   MICROALBUR 1.7 05/20/2019   LDLCALC 58 05/20/2019   CREATININE 1.09 05/20/2019    Lab on 05/20/2019  Component Date Value Ref Range Status  . Cholesterol 05/20/2019 136  0 - 200 mg/dL Final   ATP III Classification       Desirable:  < 200 mg/dL               Borderline High:  200 - 239 mg/dL          High:  > = 240 mg/dL  . Triglycerides 05/20/2019 66.0  0.0 - 149.0 mg/dL Final   Normal:  <150 mg/dLBorderline High:  150 - 199 mg/dL  . HDL 05/20/2019  65.10  >39.00 mg/dL Final  . VLDL 05/20/2019 13.2  0.0 - 40.0 mg/dL Final  . LDL Cholesterol 05/20/2019 58  0 - 99 mg/dL Final  . Total CHOL/HDL Ratio 05/20/2019 2   Final                  Men          Women1/2 Average Risk     3.4          3.3Average Risk          5.0          4.42X Average Risk          9.6          7.13X Average Risk          15.0          11.0                      . NonHDL 05/20/2019 71.20   Final   NOTE:  Non-HDL goal should be 30 mg/dL higher than patient's LDL goal (i.e. LDL goal of < 70 mg/dL, would have non-HDL goal of < 100 mg/dL)  . Microalb, Ur 05/20/2019 1.7  0.0 - 1.9 mg/dL Final  . Creatinine,U 05/20/2019 218.0  mg/dL Final  . Microalb Creat Ratio 05/20/2019 0.8  0.0 - 30.0 mg/g Final  . Sodium 05/20/2019 140  135 - 145 mEq/L Final  . Potassium 05/20/2019 4.3  3.5 - 5.1 mEq/L Final  . Chloride  05/20/2019 105  96 - 112 mEq/L Final  . CO2 05/20/2019 31  19 - 32 mEq/L Final  . Glucose, Bld 05/20/2019 151* 70 - 99 mg/dL Final  . BUN 05/20/2019 17  6 - 23 mg/dL Final  . Creatinine, Ser 05/20/2019 1.09  0.40 - 1.50 mg/dL Final  . Total Bilirubin 05/20/2019 0.9  0.2 - 1.2 mg/dL Final  . Alkaline Phosphatase 05/20/2019 42  39 - 117 U/L Final  . AST 05/20/2019 28  0 - 37 U/L Final  . ALT 05/20/2019 37  0 - 53 U/L Final  . Total Protein 05/20/2019 6.1  6.0 - 8.3 g/dL Final  . Albumin 05/20/2019 4.0  3.5 - 5.2 g/dL Final  . GFR 05/20/2019 82.20  >60.00 mL/min Final  . Calcium 05/20/2019 9.1  8.4 - 10.5 mg/dL Final  . Hgb A1c MFr Bld 05/20/2019 6.1  4.6 - 6.5 % Final   Glycemic Control Guidelines for People with Diabetes:Non Diabetic:  <6%Goal of Therapy: <7%Additional Action Suggested:  >8%      Allergies as of 05/23/2019   No Known Allergies     Medication List       Accurate as of May 23, 2019 10:47 AM. If you have any questions, ask your nurse or doctor.        STOP taking these medications   FreeStyle Libre 2 Sensor Systm Misc Stopped by: Elayne Snare, MD   meloxicam 7.5 MG tablet Commonly known as: Mobic Stopped by: Elayne Snare, MD     TAKE these medications   aspirin 81 MG tablet Take 81 mg by mouth daily.   ezetimibe 10 MG tablet Commonly known as: ZETIA Take 1 tablet by mouth once daily   fluticasone 50 MCG/ACT nasal spray Commonly known as: FLONASE USE 2 SPRAY(S) IN EACH NOSTRIL ONCE DAILY   olmesartan-hydrochlorothiazide 20-12.5 MG tablet Commonly known as: BENICAR HCT Take 1 tablet by mouth daily. Take 1 tablet by mouth  once daily.   pioglitazone-metformin 15-500 MG tablet Commonly known as: ACTOPLUS MET Take 1 tablet by mouth twice daily   rosuvastatin 20 MG tablet Commonly known as: CRESTOR Take 1 tablet by mouth once daily   sildenafil 20 MG tablet Commonly known as: REVATIO Take 2-3 tablets by mouth as needed, as directed Discontinue if  having headaches.       Allergies:  No Known Allergies  Past Medical History:  Diagnosis Date  . Allergic rhinitis due to pollen   . Anal fissure   . Bilateral sciatica 07/09/2018  . Chronic pain syndrome 07/09/2018  . Chronic, continuous use of opioids 07/09/2018  . DDD (degenerative disc disease), lumbar   . Essential hypertension, malignant   . Hemorrhoids   . Lumbago   . Lumbar post-laminectomy syndrome 07/09/2018  . Plantar fasciitis of left foot   . Pure hypercholesterolemia   . Spinal stenosis of lumbar region 07/09/2018  . Type II or unspecified type diabetes mellitus without mention of complication, not stated as uncontrolled    dx in 2005  . Unspecified vitamin D deficiency     Past Surgical History:  Procedure Laterality Date  . COLONOSCOPY     in [facility], MD no longer in practice, does not recall the name of facility. Does belive polyps were removed  . LUMBAR DISC SURGERY  2011   total of 3 sx on back per pt  . MAXIMUM ACCESS (MAS)POSTERIOR LUMBAR INTERBODY FUSION (PLIF) 1 LEVEL N/A 06/10/2014   Procedure: Redo Lumbar Five-Sacral One Decompression with maximum access posterior lumbar interbody fusion;  Surgeon: Erline Levine, MD;  Location: [place] [facility];  Service: Neurosurgery;  Laterality: N/A;  Redo Decompression with maximum access posterior lumbar interbody fusion, L5-S1    Family History  Problem Relation Age of Onset  . Hypertension Mother   . Kidney disease Mother   . Diabetes Mother   . Hypertension Father   . Diabetes Sister   . Cataracts Sister   . Seizures Brother        caused his death  . Colon cancer Neg Hx   . Esophageal cancer Neg Hx   . Rectal cancer Neg Hx   . Stomach cancer Neg Hx   . Heart disease Neg Hx   . Colon polyps Neg Hx     Social History:  reports that he quit smoking about 4 months ago. His smoking use included cigars. He has never used smokeless tobacco. He reports previous alcohol use. He reports previous drug use. Drug:  Marijuana.   Lab on 05/20/2019  Component Date Value Ref Range Status  . Cholesterol 05/20/2019 136  0 - 200 mg/dL Final   ATP III Classification       Desirable:  < 200 mg/dL               Borderline High:  200 - 239 mg/dL          High:  > = 240 mg/dL  . Triglycerides 05/20/2019 66.0  0.0 - 149.0 mg/dL Final   Normal:  <150 mg/dLBorderline High:  150 - 199 mg/dL  . HDL 05/20/2019 65.10  >39.00 mg/dL Final  . VLDL 05/20/2019 13.2  0.0 - 40.0 mg/dL Final  . LDL Cholesterol 05/20/2019 58  0 - 99 mg/dL Final  . Total CHOL/HDL Ratio 05/20/2019 2   Final                  Men  Women1/2 Average Risk     3.4          3.3Average Risk          5.0          4.42X Average Risk          9.6          7.13X Average Risk          15.0          11.0                      . NonHDL 05/20/2019 71.20   Final   NOTE:  Non-HDL goal should be 30 mg/dL higher than patient's LDL goal (i.e. LDL goal of < 70 mg/dL, would have non-HDL goal of < 100 mg/dL)  . Microalb, Ur 05/20/2019 1.7  0.0 - 1.9 mg/dL Final  . Creatinine,U 05/20/2019 218.0  mg/dL Final  . Microalb Creat Ratio 05/20/2019 0.8  0.0 - 30.0 mg/g Final  . Sodium 05/20/2019 140  135 - 145 mEq/L Final  . Potassium 05/20/2019 4.3  3.5 - 5.1 mEq/L Final  . Chloride 05/20/2019 105  96 - 112 mEq/L Final  . CO2 05/20/2019 31  19 - 32 mEq/L Final  . Glucose, Bld 05/20/2019 151* 70 - 99 mg/dL Final  . BUN 05/20/2019 17  6 - 23 mg/dL Final  . Creatinine, Ser 05/20/2019 1.09  0.40 - 1.50 mg/dL Final  . Total Bilirubin 05/20/2019 0.9  0.2 - 1.2 mg/dL Final  . Alkaline Phosphatase 05/20/2019 42  39 - 117 U/L Final  . AST 05/20/2019 28  0 - 37 U/L Final  . ALT 05/20/2019 37  0 - 53 U/L Final  . Total Protein 05/20/2019 6.1  6.0 - 8.3 g/dL Final  . Albumin 05/20/2019 4.0  3.5 - 5.2 g/dL Final  . GFR 05/20/2019 82.20  >60.00 mL/min Final  . Calcium 05/20/2019 9.1  8.4 - 10.5 mg/dL Final  . Hgb A1c MFr Bld 05/20/2019 6.1  4.6 - 6.5 % Final   Glycemic  Control Guidelines for People with Diabetes:Non Diabetic:  <6%Goal of Therapy: <7%Additional Action Suggested:  >8%     Review of Systems:  HYPERTENSION:   Treated with Benicar HCT, followed by PCP  Home blood pressure check periodically also  BP Readings from Last 3 Encounters:  10/30/18 102/78  10/28/18 93/69  10/02/18 106/60     HYPERLIPIDEMIA: The lipid abnormality consists of elevated LDL  He had been on 40 mg atorvastatin since 12/13 and this was increased to 80 mg in 6/16 Previously did have LDL particle number of 1348 previously and which was relatively high at 1216 in 6/16  With Crestor 20 mg and Zetia daily his LDL is improved further  Labs as follows:    Lab Results  Component Value Date   CHOL 136 05/20/2019   CHOL 155 10/30/2018   CHOL 146 10/16/2017   Lab Results  Component Value Date   HDL 65.10 05/20/2019   HDL 69.40 10/30/2018   HDL 79.30 10/16/2017   Lab Results  Component Value Date   LDLCALC 58 05/20/2019   LDLCALC 72 10/30/2018   LDLCALC 59 10/16/2017   Lab Results  Component Value Date   TRIG 66.0 05/20/2019   TRIG 64.0 10/30/2018   TRIG 41.0 10/16/2017   Lab Results  Component Value Date   CHOLHDL 2 05/20/2019   CHOLHDL 2 10/30/2018   CHOLHDL 2 10/16/2017  No results found for: LDLDIRECT  Lab Results  Component Value Date   CHOL 136 05/20/2019   HDL 65.10 05/20/2019   LDLCALC 58 05/20/2019   TRIG 66.0 05/20/2019   CHOLHDL 2 05/20/2019        He had a foot exam in 5/20    Examination:   There were no vitals taken for this visit.  There is no height or weight on file to calculate BMI.      ASSESSMENT/ PLAN:   Diabetes type 2, last BMI 24  He has had mild diabetes with consistently good A1c levels in the normal range   He has been on Actoplusmet 15/500 twice a day for quite some time and usually good control  His weight has fluctuated over the last few months based on his diet and exercise regimen Recently  he thinks he is trying to improve his diet and walk as much as tolerated and his weight is coming back down He is checking blood sugars with the One Touch meter and fasting readings appear to be averaging about 135 A1c is slightly higher than usual  Recommendations: We will increase Actoplusmet today 15/850 dose instead of 15/500 for better efficacy and improved fasting readings To try and check readings after meals and alternate with fasting readings   HYPERLIPIDEMIA with increased LDL particle number in the past LDL is slightly improved and he will continue the same regimen He will also continue to avoid high fat foods and sweets   Hypertension: ls controlled with Benicar HCT and followed by PCP  Microalbumin normal  Follow-up in 3 months  There are no Patient Instructions on file for this visit.  Elayne Snare 05/23/2019, 10:47 AM     Note: This office note was prepared with Dragon voice recognition system technology. Any transcriptional errors that result from this process are unintentional.

## 2019-05-25 NOTE — Therapy (Addendum)
[place], [place], [address] Phone: [phone]   Fax:  [phone]  Physical Therapy Evaluation/DC Summary  Patient Details  Name: Cameron Cardenas MRN: [id number] Date of Birth: [birth year] Referring Provider (PT): Leandrew Koyanagi, MD   Encounter Date: 05/22/2019  PT End of Session - 05/24/19 0938    Visit Number  1    Number of Visits  4    Date for PT Re-Evaluation  07/12/19    Authorization Type  MCD    Authorization - Visit Number  3    Authorization - Number of Visits  1    PT Start Time  [phone]    PT Stop Time  0920    PT Time Calculation (min)  47 min    Activity Tolerance  Patient tolerated treatment well    Behavior During Therapy  [facility] for tasks assessed/performed       Past Medical History:  Diagnosis Date  . Allergic rhinitis due to pollen   . Anal fissure   . Bilateral sciatica 07/09/2018  . Chronic pain syndrome 07/09/2018  . Chronic, continuous use of opioids 07/09/2018  . DDD (degenerative disc disease), lumbar   . Essential hypertension, malignant   . Hemorrhoids   . Lumbago   . Lumbar post-laminectomy syndrome 07/09/2018  . Plantar fasciitis of left foot   . Pure hypercholesterolemia   . Spinal stenosis of lumbar region 07/09/2018  . Type II or unspecified type diabetes mellitus without mention of complication, not stated as uncontrolled    dx in 2005  . Unspecified vitamin D deficiency     Past Surgical History:  Procedure Laterality Date  . COLONOSCOPY     in [facility], MD no longer in practice, does not recall the name of facility. Does belive polyps were removed  . LUMBAR DISC SURGERY  2011   total of 3 sx on back per pt  . MAXIMUM ACCESS (MAS)POSTERIOR LUMBAR INTERBODY FUSION (PLIF) 1 LEVEL N/A 06/10/2014   Procedure: Redo Lumbar Five-Sacral One Decompression with maximum access posterior lumbar interbody fusion;  Surgeon: Erline Levine, MD;  Location: [facility]URO ORS;  Service:  Neurosurgery;  Laterality: N/A;  Redo Decompression with maximum access posterior lumbar interbody fusion, L5-S1    There were no vitals filed for this visit.   Subjective Assessment - 05/25/19 1020    Subjective  Pt reports B knees have been bothering him for approx. 4 months. He primarily noticed the pain occurs when walking 2 to 3 miles. He rates his pain range 0-7/10, R usually wrose than L. Pt states he walks 3x a week.    Limitations  Walking    How long can you sit comfortably?  No limitation    How long can you stand comfortably?  No limitation    How long can you walk comfortably?  2 miles    Diagnostic tests  X-ray R knee: IMPRESSION:1. Degenerative oblique tear of the anterior horn lateral meniscusextending from the superior surface to the meniscal periphery.2. Small knee effusion. Small Baker's cyst.3. Mild focal synovitis in Hoffa's fat pad just below the lateralpatellar facet, which can be seen in the setting of patellartendon-lateral femoral condyle friction syndrome4. Mild degenerative chondral thinning in the medial compartment andalong the femoral trochlear groove.5. Mild distal semimembranosus tendinopathy.  X-ray L knee: IMPRESSION:1. Mild tricompartmental degenerative chondral thinning. Chondralfissuring along the femoral trochlear groove.2. Trace knee effusion and small Baker's cyst.3. Trace prepatellar subcutaneous edema.  Patient Stated Goals  to continue walking for exercise without knee pain.    Pain Onset  More than a month ago         [facility] PT Assessment - 05/25/19 0001      Assessment   Medical Diagnosis  Primary osteoarthritis of right and left knee    Referring Provider (PT)  Leandrew Koyanagi, MD    Onset Date/Surgical Date  01/11/19    Hand Dominance  Right    Prior Therapy  None      Precautions   Precautions  None    Required Braces or Orthoses  Other Brace/Splint   wears a neoprene knee sleeve at times     Restrictions   Weight Bearing Restrictions   Yes      Balance Screen   Has the patient fallen in the past 6 months  No      [place] residence    Living Arrangements  Spouse/significant other    Type of [place] to enter    Entrance Stairs-Number of Steps  0    Home Layout  One level    [place] - single point      Prior Function   Level of Independence  Independent      Cognition   Overall Cognitive Status  Within Functional Limits for tasks assessed      Observation/Other Assessments   Observations  VMO atrophy bilat, Laterally facing patella bilat, tibial bowing bilat    Focus on Therapeutic Outcomes (FOTO)   NA for payor type      Observation/Other Assessments-Edema    Edema  --   R 37.8, L 37.3 Min swelling     Sensation   Light Touch  Appears Intact      Coordination   Gross Motor Movements are Fluid and Coordinated  Yes      ROM / Strength   AROM / PROM / Strength  AROM;Strength      AROM   AROM Assessment Site  Knee    Right/Left Knee  Right;Left    Right Knee Extension  -5    Right Knee Flexion  135    Left Knee Extension  0    Left Knee Flexion  135      Strength   Strength Assessment Site  Knee;Hip    Right/Left Hip  Right;Left    Right Hip Flexion  3+/5    Right Hip Extension  3+/5    Right Hip ABduction  3+/5    Right Hip ADduction  4-/5    Left Hip Flexion  3+/5    Left Hip Extension  3+/5    Left Hip ABduction  3+/5    Left Hip ADduction  4/5    Right/Left Knee  Right;Left    Right Knee Flexion  4/5    Right Knee Extension  4/5    Left Knee Flexion  4/5    Left Knee Extension  4/5      Palpation   Patella mobility  WFLs    Palpation comment  Pt is tender to the peri-patella area and esp to the patella tendon.Swelling noted of the medial and lateral IFP.       Special Tests    Special Tests  Knee Special Tests;Meniscus Tests    Knee Special tests   Patellofemoral Grind Test (Clarke's Sign)    Meniscus  Tests  McMurray Test      Patellofemoral Grind test (Clark's Sign)   Findings  Negative    Side   Left    Comments  Positive Rt with crepitus noted      McMurray Test   Findings  Negative    Side  Right;Left      Ambulation/Gait   Gait Pattern  Step-through pattern;Trendelenburg   min trendelenberg; outtoeing                 Objective measurements completed on examination: See above findings.              PT Education - 05/24/19 0935    Education Details  Eval finding. POC. HEP for LE strengthening, complete 2x daily. Use of rest and cold packs for pain management. Limit walking to tolated distances.    Person(s) Educated  Patient    Methods  Explanation;Demonstration;Tactile cues;Verbal cues;Handout    Comprehension  Verbalized understanding;Returned demonstration;Verbal cues required;Tactile cues required;Need further instruction       PT Short Term Goals - 05/24/19 1213      PT SHORT TERM GOAL #1   Title  Pt will be Ind in a HEP for LE strength and ROM.    Baseline  No program    Time  3    Period  Weeks    Status  New    Target Date  06/14/19      PT SHORT TERM GOAL #2   Title  Pt will voice understanding of methods to manage knee pain    Baseline  Limited understanding    Time  3    Period  Weeks    Status  New    Target Date  06/14/19        PT Long Term Goals - 05/25/19 1038      PT LONG TERM GOAL #1   Title  Increase bilat knee and hip strength to 4+/5    Baseline  3+ to 4/5    Time  7    Period  Weeks    Status  New    Target Date  07/13/19      PT LONG TERM GOAL #2   Title  Increase R knee ext to -3d or less    Baseline  -5d    Time  7    Period  Weeks    Status  New    Target Date  07/13/19      PT LONG TERM GOAL #3   Title  Improve gait quality to a pattern without a Trendelenberg deficit    Baseline  Trendelenberg deficit is present bilat    Time  7    Period  Weeks    Status  New    Target Date  07/13/19       PT LONG TERM GOAL #4   Title  Pt will return to walking at least 3 miles 3 to 4x a week with knee pain in a 0-3/10 range.    Baseline  0-7/10    Time  7    Period  Weeks    Status  New    Target Date  07/13/19      PT LONG TERM GOAL #5   Title  Pt will be ind in a final HEP for LE strength and ROM    Baseline  No program    Time  7    Period  Weeks    Status  New  Target Date  07/13/19             Plan - 05/24/19 0942    Clinical Impression Statement  Pt presents c bilat knee pain, R greater than L. Eval findings:Bilat trendelenberg gait, bilat VMO atrophy, bilat weakness of the knees and hip, + Clark's sign R, swelling med and lat IFPs, Decreased R knee ext,. Pt will benefit from OPPT to address strength, ROM, imflamation, and pain of both knees to improve his functional mobility and gait tolerance.    Personal Factors and Comorbidities  Age;Comorbidity 2    Comorbidities  arthritis, HTN    Examination-Activity Limitations  Bend;Stand;Stairs;Squat;Sit;Locomotion Level    Stability/Clinical Decision Making  Stable/Uncomplicated    Clinical Decision Making  Low    Rehab Potential  Good    PT Frequency  2x / week    PT Duration  6 weeks    PT Treatment/Interventions  Cryotherapy;Electrical Stimulation;Moist Heat;Iontophoresis '4mg'$ /ml Dexamethasone;Gait training;Stair training;Functional mobility training;Therapeutic activities;Therapeutic exercise;Balance training;Neuromuscular re-education;Manual techniques;Patient/family education;Passive range of motion;Taping;Joint Manipulations    PT Next Visit Plan  Assess response to HEP; complete 5x STS and set lon term goal    PT Home Exercise Plan  [facility]    Consulted and Agree with Plan of Care  Patient       Patient will benefit from skilled therapeutic intervention in order to improve the following deficits and impairments:  Decreased activity tolerance, Decreased mobility, Decreased knowledge of precautions, Decreased range  of motion, Decreased strength, Increased edema, Difficulty walking, Impaired UE functional use, Pain  Visit Diagnosis: Chronic pain of left knee - Plan: PT plan of care cert/re-cert  Chronic pain of right knee - Plan: PT plan of care cert/re-cert  Difficulty in walking, not elsewhere classified - Plan: PT plan of care cert/re-cert  Localized edema - Plan: PT plan of care cert/re-cert  Muscle weakness (generalized) - Plan: PT plan of care cert/re-cert  Other abnormalities of gait and mobility - Plan: PT plan of care cert/re-cert     Problem List Patient Active Problem List   Diagnosis Date Noted  . Primary osteoarthritis of right knee 04/03/2019  . Primary osteoarthritis of left knee 04/03/2019  . 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 Constipation due to opioid therapy 10/30/2018  . Chronic, continuous use of opioids 07/09/2018  . Chronic pain syndrome 07/09/2018  . Spinal stenosis of lumbar region 07/09/2018  . Lumbar post-laminectomy syndrome 07/09/2018  . Bilateral sciatica 07/09/2018  . Short lasting unilateral neuralgiform headache with conjunctival injection and tearing (SUNCT), not intractable 02/01/2018  . Temporal pain 02/01/2018  . Prolapsed internal hemorrhoids, grade 2 11/01/2017  . Palpitations 07/31/2016  . Routine general medical examination at a health care facility 12/09/2015  . Essential hypertension 09/03/2015  . Allergic rhinitis due to pollen 09/03/2015  . ED (erectile dysfunction) of organic origin 07/09/2015  . Leukopenia 02/25/2015  . Hyperlipidemia with target LDL less than 100 07/02/2014  . Herniated lumbar intervertebral disc 06/10/2014  . Type II diabetes mellitus with manifestations ([place]) 08/28/2012   Gar Ponto MS, PT 05/25/19 10:57 AM   PHYSICAL THERAPY DISCHARGE SUMMARY  Visits from Start of Care: 1  Current functional level related to goals / functional outcomes: Unknown- Pt self DCed   Remaining deficits: Unknown-Pt self  DCed  Education / Equipment: HEP Plan:  Patient goals were not met. Patient is being discharged due to not returning since the last visit.  ?????       [place], [place], [address] Phone: [phone]   Fax:  [phone]  Name: Cameron Cardenas MRN: [id number] Date of Birth: [birth year]

## 2019-05-31 ENCOUNTER — Telehealth: Payer: Self-pay | Admitting: Endocrinology

## 2019-05-31 NOTE — Telephone Encounter (Signed)
[facility] on [address] called checking status on PA for the pioglitazone-metformin (ACTOPLUS MET) 15-850 MG tablet [[id number]]  and they said they've faxed it twice - once on the 13th and once on the 17th - I told her I did not think we got it and to please resend it because I do not see it In the chart that its been started. 

## 2019-06-03 ENCOUNTER — Ambulatory Visit: Payer: Medicaid Other | Attending: Anesthesiology | Admitting: Anesthesiology

## 2019-06-03 ENCOUNTER — Other Ambulatory Visit: Payer: Self-pay

## 2019-06-03 DIAGNOSIS — G894 Chronic pain syndrome: Secondary | ICD-10-CM

## 2019-06-03 DIAGNOSIS — M5431 Sciatica, right side: Secondary | ICD-10-CM

## 2019-06-03 DIAGNOSIS — M48062 Spinal stenosis, lumbar region with neurogenic claudication: Secondary | ICD-10-CM

## 2019-06-03 DIAGNOSIS — M961 Postlaminectomy syndrome, not elsewhere classified: Secondary | ICD-10-CM

## 2019-06-03 DIAGNOSIS — M5136 Other intervertebral disc degeneration, lumbar region: Secondary | ICD-10-CM

## 2019-06-03 DIAGNOSIS — M5126 Other intervertebral disc displacement, lumbar region: Secondary | ICD-10-CM

## 2019-06-03 DIAGNOSIS — F1190 Opioid use, unspecified, uncomplicated: Secondary | ICD-10-CM

## 2019-06-04 ENCOUNTER — Telehealth: Payer: Self-pay

## 2019-06-04 ENCOUNTER — Other Ambulatory Visit: Payer: Self-pay

## 2019-06-04 MED ORDER — PIOGLITAZONE HCL 30 MG PO TABS: 30.0000 mg | ORAL_TABLET | Freq: Every day | ORAL | 1 refills | Status: DC

## 2019-06-04 MED ORDER — METFORMIN HCL 1000 MG PO TABS: 1000.0000 mg | ORAL_TABLET | Freq: Two times a day (BID) | ORAL | 1 refills | Status: DC

## 2019-06-04 NOTE — Telephone Encounter (Signed)
Sent new Rx to pharmacy. Called pt and notified him of this. He verbalized understanding.

## 2019-06-04 NOTE — Telephone Encounter (Signed)
Will change it to Metformin 1 g twice daily and Actos 30 mg once daily separately

## 2019-06-04 NOTE — Telephone Encounter (Signed)
He states he was supposed to have a phone visit with Dr. Andree Elk yesterday and didn't get a call. Can someone call Andree Elk and ask if he is planning on calling him or do we need to schedule him another appt.

## 2019-06-04 NOTE — Telephone Encounter (Signed)
Message sent to Dr Andree Elk.

## 2019-06-04 NOTE — Telephone Encounter (Signed)
Do you want to do PA?

## 2019-06-05 ENCOUNTER — Encounter: Payer: Self-pay | Admitting: Anesthesiology

## 2019-06-05 ENCOUNTER — Ambulatory Visit: Payer: Medicaid Other | Attending: Anesthesiology | Admitting: Anesthesiology

## 2019-06-05 ENCOUNTER — Other Ambulatory Visit: Payer: Self-pay

## 2019-06-05 DIAGNOSIS — F1190 Opioid use, unspecified, uncomplicated: Secondary | ICD-10-CM | POA: Diagnosis not present

## 2019-06-05 DIAGNOSIS — G894 Chronic pain syndrome: Secondary | ICD-10-CM | POA: Diagnosis not present

## 2019-06-05 DIAGNOSIS — M5431 Sciatica, right side: Secondary | ICD-10-CM | POA: Diagnosis not present

## 2019-06-05 DIAGNOSIS — M5432 Sciatica, left side: Secondary | ICD-10-CM

## 2019-06-05 DIAGNOSIS — M48062 Spinal stenosis, lumbar region with neurogenic claudication: Secondary | ICD-10-CM

## 2019-06-05 DIAGNOSIS — M961 Postlaminectomy syndrome, not elsewhere classified: Secondary | ICD-10-CM

## 2019-06-05 DIAGNOSIS — M5136 Other intervertebral disc degeneration, lumbar region: Secondary | ICD-10-CM

## 2019-06-05 MED ORDER — OXYCODONE-ACETAMINOPHEN 10-325 MG PO TABS: 1.0000 | ORAL_TABLET | Freq: Four times a day (QID) | ORAL | 0 refills | Status: AC | PRN

## 2019-06-05 MED ORDER — OXYCODONE-ACETAMINOPHEN 10-325 MG PO TABS: 1.0000 | ORAL_TABLET | Freq: Four times a day (QID) | ORAL | 0 refills | Status: DC | PRN

## 2019-06-05 NOTE — Progress Notes (Signed)
Virtual Visit via Telephone Note  I connected with Cameron Cardenas on 06/05/19 at 12:45 PM EDT by telephone and verified that I am speaking with the correct person using two identifiers.  Location: Patient: Home Provider: Pain control center   I discussed the limitations, risks, security and privacy concerns of performing an evaluation and management service by telephone and the availability of in person appointments. I also discussed with the patient that there may be a patient responsible charge related to this service. The patient expressed understanding and agreed to proceed.   History of Present Illness: I spoke with Cameron Cardenas today via telephone.  He reports that his pain is decently managed with his current medication regimen.  No new changes in the quality characteristic or distribution of his pain are noted.  Primarily a gnawing aching pain in the low back with radiation into the hips buttocks and posterior lateral legs.  No change in strength or lower extremity function is noted.  He is taking his medications as prescribed and these are working well and giving him good relief from pain when he takes them.  He derives good lifestyle improvement with the medications with no side effects reported.  Otherwise he is in his usual state of health at this time.   Observations/Objective:  Current Outpatient Medications:  .  aspirin 81 MG tablet, Take 81 mg by mouth daily., Disp: , Rfl:  .  ezetimibe (ZETIA) 10 MG tablet, Take 1 tablet by mouth once daily, Disp: 90 tablet, Rfl: 0 .  fluticasone (FLONASE) 50 MCG/ACT nasal spray, USE 2 SPRAY(S) IN EACH NOSTRIL ONCE DAILY, Disp: , Rfl:  .  metFORMIN (GLUCOPHAGE) 1000 MG tablet, Take 1 tablet (1,000 mg total) by mouth 2 (two) times daily with a meal., Disp: 60 tablet, Rfl: 1 .  olmesartan-hydrochlorothiazide (BENICAR HCT) 20-12.5 MG tablet, Take 1 tablet by mouth daily. Take 1 tablet by mouth once daily., Disp: , Rfl:  .  [START ON 06/12/2019]  oxyCODONE-acetaminophen (PERCOCET) 10-325 MG tablet, Take 1 tablet by mouth every 6 (six) hours as needed for pain., Disp: 120 tablet, Rfl: 0 .  [START ON 07/12/2019] oxyCODONE-acetaminophen (PERCOCET) 10-325 MG tablet, Take 1 tablet by mouth every 6 (six) hours as needed for pain., Disp: 120 tablet, Rfl: 0 .  pioglitazone (ACTOS) 30 MG tablet, Take 1 tablet (30 mg total) by mouth daily., Disp: 30 tablet, Rfl: 1 .  rosuvastatin (CRESTOR) 20 MG tablet, Take 1 tablet by mouth once daily, Disp: 90 tablet, Rfl: 0 .  sildenafil (REVATIO) 20 MG tablet, Take 2-3 tablets by mouth as needed, as directed Discontinue if having headaches., Disp: 30 tablet, Rfl: 1  Assessment and Plan: 1. Spinal stenosis of lumbar region with neurogenic claudication   2. Chronic, continuous use of opioids   3. Chronic pain syndrome   4. Bilateral sciatica   5. Lumbar post-laminectomy syndrome   6. DDD (degenerative disc disease), lumbar   7. Failed back syndrome of lumbar spine   Based upon our discussion today and upon review of the [facility] practitioner database information I am going to refill his medications for the next 2 months.  This will be dated for June and July with scheduled return to clinic in 2 months.  We can do this virtual if necessary.  Furthermore I am requesting that he report in the next 2 weeks for a scheduled urine drug screen.  No evidence of any diverting or illicit use are noted.  I have encouraged him  to continue follow-up with his primary care physicians for his baseline medical care.  Follow Up Instructions:    I discussed the assessment and treatment plan with the patient. The patient was provided an opportunity to ask questions and all were answered. The patient agreed with the plan and demonstrated an understanding of the instructions.   The patient was advised to call back or seek an in-person evaluation if the symptoms worsen or if the condition fails to improve as anticipated.  I  provided 25 minutes of non-face-to-face time during this encounter.   Molli Barrows, MD

## 2019-06-30 ENCOUNTER — Other Ambulatory Visit: Payer: Self-pay | Admitting: Endocrinology

## 2019-07-02 LAB — TOXASSURE SELECT 13 (MW), URINE

## 2019-07-17 ENCOUNTER — Ambulatory Visit: Payer: BLUE CROSS/BLUE SHIELD | Admitting: Podiatry

## 2019-07-28 ENCOUNTER — Other Ambulatory Visit: Payer: Self-pay | Admitting: Endocrinology

## 2019-07-29 ENCOUNTER — Ambulatory Visit: Payer: 59 | Admitting: Podiatry

## 2019-08-01 ENCOUNTER — Ambulatory Visit: Payer: 59 | Attending: Anesthesiology | Admitting: Anesthesiology

## 2019-08-01 ENCOUNTER — Other Ambulatory Visit: Payer: Self-pay

## 2019-08-01 DIAGNOSIS — M51369 Other intervertebral disc degeneration, lumbar region without mention of lumbar back pain or lower extremity pain: Secondary | ICD-10-CM

## 2019-08-01 DIAGNOSIS — M961 Postlaminectomy syndrome, not elsewhere classified: Secondary | ICD-10-CM

## 2019-08-01 DIAGNOSIS — M5431 Sciatica, right side: Secondary | ICD-10-CM | POA: Diagnosis not present

## 2019-08-01 DIAGNOSIS — F1190 Opioid use, unspecified, uncomplicated: Secondary | ICD-10-CM | POA: Diagnosis not present

## 2019-08-01 DIAGNOSIS — M48062 Spinal stenosis, lumbar region with neurogenic claudication: Secondary | ICD-10-CM

## 2019-08-01 DIAGNOSIS — M5136 Other intervertebral disc degeneration, lumbar region: Secondary | ICD-10-CM

## 2019-08-01 DIAGNOSIS — G894 Chronic pain syndrome: Secondary | ICD-10-CM

## 2019-08-01 DIAGNOSIS — M5432 Sciatica, left side: Secondary | ICD-10-CM

## 2019-08-02 MED ORDER — OXYCODONE-ACETAMINOPHEN 10-325 MG PO TABS: 1.0000 | ORAL_TABLET | Freq: Four times a day (QID) | ORAL | 0 refills | Status: DC | PRN

## 2019-08-02 NOTE — Progress Notes (Signed)
Virtual Visit via Telephone Note  I connected with Cameron Cardenas on 08/02/19 at  1:15 PM EDT by telephone and verified that I am speaking with the correct person using two identifiers.  Location: Patient: Home Provider: Pain control center   I discussed the limitations, risks, security and privacy concerns of performing an evaluation and management service by telephone and the availability of in person appointments. I also discussed with the patient that there may be a patient responsible charge related to this service. The patient expressed understanding and agreed to proceed.   History of Present Illness: I spoke with Cameron Cardenas via telephone as we were unable to connect for the video portion of the virtual conference but he reports that he has been doing reasonably well with his low back pain other than an occasional intermittent mild exacerbation of the same quality characteristic and distribution of pain.  This primarily affects his low back bilateral hips and posterior lateral legs.  He has been more active trying to do his exercises as tolerated.  He did come in for a urine screen recently.  I did review this with him today.  It showed no evidence of oxycodone.  He denies any diverting or illicit use and is unsure why the urine came back negative however we will plan on a recheck within the next few days.  He maintains that he is using his medications as prescribed and these continue to give him good relief with no side effects reported.  Otherwise his bowel bladder function and lower extremity strength and function have been stable as well.    Observations/Objective:   Current Outpatient Medications:  .  aspirin 81 MG tablet, Take 81 mg by mouth daily., Disp: , Rfl:  .  ezetimibe (ZETIA) 10 MG tablet, Take 1 tablet by mouth once daily, Disp: 90 tablet, Rfl: 0 .  fluticasone (FLONASE) 50 MCG/ACT nasal spray, USE 2 SPRAY(S) IN EACH NOSTRIL ONCE DAILY, Disp: , Rfl:  .  metFORMIN  (GLUCOPHAGE) 1000 MG tablet, Take 1 tablet (1,000 mg total) by mouth 2 (two) times daily with a meal., Disp: 60 tablet, Rfl: 1 .  olmesartan-hydrochlorothiazide (BENICAR HCT) 20-12.5 MG tablet, Take 1 tablet by mouth daily. Take 1 tablet by mouth once daily., Disp: , Rfl:  .  [START ON 08/11/2019] oxyCODONE-acetaminophen (PERCOCET) 10-325 MG tablet, Take 1 tablet by mouth every 6 (six) hours as needed for pain., Disp: 120 tablet, Rfl: 0 .  pioglitazone (ACTOS) 30 MG tablet, Take 1 tablet (30 mg total) by mouth daily., Disp: 30 tablet, Rfl: 1 .  rosuvastatin (CRESTOR) 20 MG tablet, Take 1 tablet by mouth once daily, Disp: 90 tablet, Rfl: 0 .  sildenafil (REVATIO) 20 MG tablet, Take 2-3 tablets by mouth as needed, as directed Discontinue if having headaches., Disp: 30 tablet, Rfl: 1 Assessment and Plan: 1. Spinal stenosis of lumbar region with neurogenic claudication   2. Chronic, continuous use of opioids   3. Chronic pain syndrome   4. Bilateral sciatica   5. Lumbar post-laminectomy syndrome   6. DDD (degenerative disc disease), lumbar   7. Failed back syndrome of lumbar spine   Based on our discussion today and upon review of the [facility] practitioner database information I am going to refill his medicine for August 1 and we will schedule him for a repeat urine drug screen sometime within the next week to 2 weeks.  I will also schedule him for a repeat follow-up in 1 month.  I  talked to him about the nature of the urine screen and the importance that it is positive for the medications that he is reported to be taking.  I have encouraged him to continue follow-up with his primary care physicians for his baseline medical care.  Follow Up Instructions:    I discussed the assessment and treatment plan with the patient. The patient was provided an opportunity to ask questions and all were answered. The patient agreed with the plan and demonstrated an understanding of the instructions.   The  patient was advised to call back or seek an in-person evaluation if the symptoms worsen or if the condition fails to improve as anticipated.  I provided 30 minutes of non-face-to-face time during this encounter.   Molli Barrows, MD

## 2019-08-16 LAB — TOXASSURE SELECT 13 (MW), URINE

## 2019-08-21 ENCOUNTER — Other Ambulatory Visit: Payer: Medicaid Other

## 2019-08-23 ENCOUNTER — Ambulatory Visit: Payer: Medicaid Other | Admitting: Endocrinology

## 2019-08-27 ENCOUNTER — Ambulatory Visit: Payer: Medicaid Other | Admitting: Endocrinology

## 2019-08-29 ENCOUNTER — Ambulatory Visit: Payer: 59 | Attending: Anesthesiology | Admitting: Anesthesiology

## 2019-08-29 ENCOUNTER — Other Ambulatory Visit: Payer: Self-pay

## 2019-08-29 ENCOUNTER — Encounter: Payer: Self-pay | Admitting: Anesthesiology

## 2019-08-29 VITALS — BP 135/85 | HR 66 | Temp 98.1°F | Resp 16 | Ht 76.0 in | Wt 200.0 lb

## 2019-08-29 DIAGNOSIS — M51369 Other intervertebral disc degeneration, lumbar region without mention of lumbar back pain or lower extremity pain: Secondary | ICD-10-CM

## 2019-08-29 DIAGNOSIS — M5432 Sciatica, left side: Secondary | ICD-10-CM | POA: Insufficient documentation

## 2019-08-29 DIAGNOSIS — M48062 Spinal stenosis, lumbar region with neurogenic claudication: Secondary | ICD-10-CM

## 2019-08-29 DIAGNOSIS — M961 Postlaminectomy syndrome, not elsewhere classified: Secondary | ICD-10-CM

## 2019-08-29 DIAGNOSIS — F1190 Opioid use, unspecified, uncomplicated: Secondary | ICD-10-CM | POA: Diagnosis present

## 2019-08-29 DIAGNOSIS — G894 Chronic pain syndrome: Secondary | ICD-10-CM | POA: Diagnosis present

## 2019-08-29 DIAGNOSIS — M5136 Other intervertebral disc degeneration, lumbar region: Secondary | ICD-10-CM | POA: Diagnosis present

## 2019-08-29 DIAGNOSIS — M5431 Sciatica, right side: Secondary | ICD-10-CM

## 2019-08-29 MED ORDER — OXYCODONE-ACETAMINOPHEN 10-325 MG PO TABS: 1.0000 | ORAL_TABLET | Freq: Three times a day (TID) | ORAL | 0 refills | Status: DC | PRN

## 2019-08-29 MED ORDER — OXYCODONE-ACETAMINOPHEN 10-325 MG PO TABS
1.0000 | ORAL_TABLET | Freq: Four times a day (QID) | ORAL | 0 refills | Status: AC | PRN
Start: 2019-09-10 — End: 2019-10-10

## 2019-08-29 MED ORDER — OXYCODONE HCL 10 MG PO TABS: 10.0000 mg | ORAL_TABLET | Freq: Four times a day (QID) | ORAL | 0 refills | Status: DC

## 2019-08-29 NOTE — Progress Notes (Signed)
Nursing Pain Medication Assessment:  Safety precautions to be maintained throughout the outpatient stay will include: orient to surroundings, keep bed in low position, maintain call bell within reach at all times, provide assistance with transfer out of bed and ambulation.  Medication Inspection Compliance: Cameron Cardenas did not comply with our request to bring his pills to be counted. He was reminded that bringing the medication bottles, even when empty, is a requirement.  Medication: None brought in. Pill/Patch Count: None available to be counted. Bottle Appearance: No container available. Did not bring bottle(s) to appointment. Filled Date: N/A Last Medication intake:  Today

## 2019-08-29 NOTE — Progress Notes (Deleted)
Nursing Pain Medication Assessment:  Safety precautions to be maintained throughout the outpatient stay will include: orient to surroundings, keep bed in low position, maintain call bell within reach at all times, provide assistance with transfer out of bed and ambulation.  Medication Inspection Compliance: Cameron Cardenas did not comply with our request to bring his pills to be counted. He was reminded that bringing the medication bottles, even when empty, is a requirement.  Medication: None brought in. Pill/Patch Count: None available to be counted. Bottle Appearance: No container available. Did not bring bottle(s) to appointment. Filled Date: N/A Last Medication intake:  {Blank single:19197::"Ran out of medicine more than 48 hours ago","Day before yesterday","Yesterday","Today"}

## 2019-08-29 NOTE — Progress Notes (Signed)
Subjective:  Patient ID: Cameron Cardenas, male    DOB: [birth year]  Age: 64 y.o. MRN: [id number]  CC: Leg Pain (bilateral)   Procedure: None  HPI Cameron Cardenas presents for reevaluation.  He has been doing reasonably well with his low back pain and leg pain and hip pain.  He is taking his medications as prescribed with the oxycodone 10 mg tablets plus acetaminophen taking these 4 times a day.  He denies any diverting or illicit use.  He continues to derive good functional lifestyle provement based on our discussion and upon the narcotic assessment sheet.  The quality characteristic and distribution of pain remained stable.  He did present for his urine drug screen and this was appropriate as well.  Otherwise he is in his usual state of health.  No changes in lower extremity function or bowel bladder function are noted.  Outpatient Medications Prior to Visit  Medication Sig Dispense Refill  . aspirin 81 MG tablet Take 81 mg by mouth daily.    Marland Kitchen ezetimibe (ZETIA) 10 MG tablet Take 1 tablet by mouth once daily 90 tablet 0  . fluticasone (FLONASE) 50 MCG/ACT nasal spray USE 2 SPRAY(S) IN EACH NOSTRIL ONCE DAILY    . metFORMIN (GLUCOPHAGE) 1000 MG tablet Take 1 tablet (1,000 mg total) by mouth 2 (two) times daily with a meal. 60 tablet 1  . olmesartan-hydrochlorothiazide (BENICAR HCT) 20-12.5 MG tablet Take 1 tablet by mouth daily. Take 1 tablet by mouth once daily.    . pioglitazone (ACTOS) 30 MG tablet Take 1 tablet (30 mg total) by mouth daily. 30 tablet 1  . rosuvastatin (CRESTOR) 20 MG tablet Take 1 tablet by mouth once daily 90 tablet 0  . sildenafil (REVATIO) 20 MG tablet Take 2-3 tablets by mouth as needed, as directed Discontinue if having headaches. 30 tablet 1  . oxyCODONE-acetaminophen (PERCOCET) 10-325 MG tablet Take 1 tablet by mouth every 6 (six) hours as needed for pain. 120 tablet 0  . econazole nitrate 1 % cream Apply topically 2 (two) times daily.     No  facility-administered medications prior to visit.    Review of Systems CNS: No confusion or sedation Cardiac: No angina or palpitations GI: No abdominal pain or constipation Constitutional: No nausea vomiting fevers or chills  Objective:  BP 135/85   Pulse 66   Temp 98.1 F (36.7 C) (Temporal)   Resp 16   Ht 6\' 4"  (1.93 m)   Wt 200 lb (90.7 kg)   SpO2 100%   BMI 24.34 kg/m    BP Readings from Last 3 Encounters:  08/29/19 135/85  10/30/18 102/78  10/28/18 93/69     Wt Readings from Last 3 Encounters:  08/29/19 200 lb (90.7 kg)  04/22/19 198 lb (89.8 kg)  10/30/18 187 lb (84.8 kg)     Physical Exam Pt is alert and oriented PERRL EOMI HEART IS RRR no murmur or rub LCTA no wheezing or rales MUSCULOSKELETAL reveals some paraspinous muscle tenderness in the lumbar region but no overt trigger points.  He ambulates with a cane to assist and has an antalgic gait.  His strength appears to be functional  Labs  Lab Results  Component Value Date   HGBA1C 6.1 05/20/2019   HGBA1C 5.8 10/30/2018   HGBA1C 6.0 04/25/2018   Lab Results  Component Value Date   MICROALBUR 1.7 05/20/2019   LDLCALC 58 05/20/2019   CREATININE 1.09 05/20/2019    -------------------------------------------------------------------------------------------------------------------- Lab Results  Component  Value Date   WBC 3.5 01/30/2018   HGB 15.0 01/30/2018   HCT 45.4 01/30/2018   PLT 138 (L) 01/30/2018   GLUCOSE 151 (H) 05/20/2019   CHOL 136 05/20/2019   TRIG 66.0 05/20/2019   HDL 65.10 05/20/2019   LDLCALC 58 05/20/2019   ALT 37 05/20/2019   AST 28 05/20/2019   NA 140 05/20/2019   K 4.3 05/20/2019   CL 105 05/20/2019   CREATININE 1.09 05/20/2019   BUN 17 05/20/2019   CO2 31 05/20/2019   TSH 1.50 03/21/2018   INR 1.14 07/30/2009   HGBA1C 6.1 05/20/2019   MICROALBUR 1.7 05/20/2019     --------------------------------------------------------------------------------------------------------------------- MR Knee Right w/o contrast  Result Date: 05/07/2019 CLINICAL DATA:  Anterior knee pain over the last 8 months EXAM: MRI OF THE RIGHT KNEE WITHOUT CONTRAST TECHNIQUE: Multiplanar, multisequence MR imaging of the knee was performed. No intravenous contrast was administered. COMPARISON:  Radiographs from 01/22/2019 FINDINGS: MENISCI Medial meniscus:  Unremarkable Lateral meniscus: Suspected degenerative oblique tear of the anterior horn of the lateral meniscus involving the superior surface for example on image 20/6. This extends to the meniscal periphery. LIGAMENTS Cruciates:  Unremarkable Collaterals:  Unremarkable CARTILAGE Patellofemoral: Mild chondral thinning along the femoral trochlear groove. Medial:  Mild degenerative chondral thinning. Lateral:  Unremarkable Joint: Small knee effusion. Mild synovitis below the lateral patellar facet. Popliteal Fossa: Small Baker's cyst. Mild distal semimembranosus tendinopathy. Extensor Mechanism:  Unremarkable Bones: No significant extra-articular osseous abnormalities identified. Other: No supplemental non-categorized findings. IMPRESSION: 1. Degenerative oblique tear of the anterior horn lateral meniscus extending from the superior surface to the meniscal periphery. 2. Small knee effusion. Small Baker's cyst. 3. Mild focal synovitis in Hoffa's fat pad just below the lateral patellar facet, which can be seen in the setting of patellar tendon-lateral femoral condyle friction syndrome 4. Mild degenerative chondral thinning in the medial compartment and along the femoral trochlear groove. 5. Mild distal semimembranosus tendinopathy. Electronically Signed   By: Van Clines M.D.   On: 05/07/2019 10:32   MR Knee Left w/o contrast  Result Date: 05/07/2019 CLINICAL DATA:  Anterior knee pain for 8 months EXAM: MRI OF THE LEFT KNEE WITHOUT CONTRAST  TECHNIQUE: Multiplanar, multisequence MR imaging of the knee was performed. No intravenous contrast was administered. COMPARISON:  Radiographs of 01/22/2019 FINDINGS: MENISCI Medial meniscus:  Unremarkable Lateral meniscus:  Unremarkable LIGAMENTS Cruciates:  Unremarkable Collaterals:  Unremarkable CARTILAGE Patellofemoral: Chondral fissuring and thinning especially along the central femoral trochlear groove, images 15-16 of series 5. Medial: Mild degenerative chondral thinning with mild marginal spurring. Lateral:  Mild degenerative chondral thinning. Joint:  Trace knee effusion. Popliteal Fossa:  Small Baker's cyst. Extensor Mechanism:  Unremarkable Bones: No significant extra-articular osseous abnormalities identified. Other: Trace prepatellar subcutaneous edema. IMPRESSION: 1. Mild tricompartmental degenerative chondral thinning. Chondral fissuring along the femoral trochlear groove. 2. Trace knee effusion and small Baker's cyst. 3. Trace prepatellar subcutaneous edema. Electronically Signed   By: Van Clines M.D.   On: 05/07/2019 10:28     Assessment & Plan:   Cameron Cardenas was seen today for leg pain.  Diagnoses and all orders for this visit:  Spinal stenosis of lumbar region with neurogenic claudication  Chronic, continuous use of opioids -     oxyCODONE-acetaminophen (PERCOCET) 10-325 MG tablet; Take 1 tablet by mouth every 8 (eight) hours as needed for pain.  Chronic pain syndrome -     oxyCODONE-acetaminophen (PERCOCET) 10-325 MG tablet; Take 1 tablet by mouth every 8 (eight) hours  as needed for pain.  Bilateral sciatica  Lumbar post-laminectomy syndrome  DDD (degenerative disc disease), lumbar  Failed back syndrome of lumbar spine  Other orders -     Discontinue: Oxycodone HCl 10 MG TABS; Take 1 tablet (10 mg total) by mouth every 6 (six) hours. -     Discontinue: Oxycodone HCl 10 MG TABS; Take 1 tablet (10 mg total) by mouth every 6 (six) hours. -     oxyCODONE-acetaminophen  (PERCOCET) 10-325 MG tablet; Take 1 tablet by mouth every 6 (six) hours as needed for pain.        ----------------------------------------------------------------------------------------------------------------------  Problem List Items Addressed This Visit      Unprioritized   Bilateral sciatica   Chronic pain syndrome   Relevant Medications   oxyCODONE-acetaminophen (PERCOCET) 10-325 MG tablet (Start on 09/10/2019)   oxyCODONE-acetaminophen (PERCOCET) 10-325 MG tablet (Start on 10/10/2019)   Chronic, continuous use of opioids   Relevant Medications   oxyCODONE-acetaminophen (PERCOCET) 10-325 MG tablet (Start on 10/10/2019)   Lumbar post-laminectomy syndrome   Spinal stenosis of lumbar region - Primary    Other Visit Diagnoses    DDD (degenerative disc disease), lumbar       Relevant Medications   oxyCODONE-acetaminophen (PERCOCET) 10-325 MG tablet (Start on 09/10/2019)   oxyCODONE-acetaminophen (PERCOCET) 10-325 MG tablet (Start on 10/10/2019)   Failed back syndrome of lumbar spine            ----------------------------------------------------------------------------------------------------------------------  1. Spinal stenosis of lumbar region with neurogenic claudication We will defer on any repeat injections today and refill his medications for the next 2 months dated for August 31 and September 30 for his Percocet 10 mg tablets 4 times a day.  I have reviewed the [facility] practitioner database information and it is appropriate.  He has been responsible with the medications and continues to gain good relief with them.  Unfortunately his pain has remained quite recalcitrant and has failed more conservative therapy.  We talked about the risks and benefits of chronic opioid management multiple times.  2. Chronic, continuous use of opioids As above - oxyCODONE-acetaminophen (PERCOCET) 10-325 MG tablet; Take 1 tablet by mouth every 8 (eight) hours as needed for pain.   Dispense: 120 tablet; Refill: 0  3. Chronic pain syndrome As above - oxyCODONE-acetaminophen (PERCOCET) 10-325 MG tablet; Take 1 tablet by mouth every 8 (eight) hours as needed for pain.  Dispense: 120 tablet; Refill: 0  4. Bilateral sciatica Continue core stretching strengthening exercises.  5. Lumbar post-laminectomy syndrome   6. DDD (degenerative disc disease), lumbar   7. Failed back syndrome of lumbar spine As above and continue follow-up with her primary care physicians for his baseline medical care.    ----------------------------------------------------------------------------------------------------------------------  I have discontinued Cameron Cardenas's Oxycodone HCl and Oxycodone HCl. I am also having him start on oxyCODONE-acetaminophen. Additionally, I am having him maintain his aspirin, fluticasone, olmesartan-hydrochlorothiazide, sildenafil, pioglitazone, metFORMIN, ezetimibe, rosuvastatin, econazole nitrate, and oxyCODONE-acetaminophen.   Meds ordered this encounter  Medications  . DISCONTD: Oxycodone HCl 10 MG TABS    Sig: Take 1 tablet (10 mg total) by mouth every 6 (six) hours.    Dispense:  120 tablet    Refill:  0  . DISCONTD: Oxycodone HCl 10 MG TABS    Sig: Take 1 tablet (10 mg total) by mouth every 6 (six) hours.    Dispense:  120 tablet    Refill:  0  . oxyCODONE-acetaminophen (PERCOCET) 10-325 MG tablet    Sig: Take  1 tablet by mouth every 6 (six) hours as needed for pain.    Dispense:  120 tablet    Refill:  0  . oxyCODONE-acetaminophen (PERCOCET) 10-325 MG tablet    Sig: Take 1 tablet by mouth every 8 (eight) hours as needed for pain.    Dispense:  120 tablet    Refill:  0   Patient's Medications  New Prescriptions   OXYCODONE-ACETAMINOPHEN (PERCOCET) 10-325 MG TABLET    Take 1 tablet by mouth every 8 (eight) hours as needed for pain.  Previous Medications   ASPIRIN 81 MG TABLET    Take 81 mg by mouth daily.   ECONAZOLE NITRATE 1 %  CREAM    Apply topically 2 (two) times daily.   EZETIMIBE (ZETIA) 10 MG TABLET    Take 1 tablet by mouth once daily   FLUTICASONE (FLONASE) 50 MCG/ACT NASAL SPRAY    USE 2 SPRAY(S) IN EACH NOSTRIL ONCE DAILY   METFORMIN (GLUCOPHAGE) 1000 MG TABLET    Take 1 tablet (1,000 mg total) by mouth 2 (two) times daily with a meal.   OLMESARTAN-HYDROCHLOROTHIAZIDE (BENICAR HCT) 20-12.5 MG TABLET    Take 1 tablet by mouth daily. Take 1 tablet by mouth once daily.   PIOGLITAZONE (ACTOS) 30 MG TABLET    Take 1 tablet (30 mg total) by mouth daily.   ROSUVASTATIN (CRESTOR) 20 MG TABLET    Take 1 tablet by mouth once daily   SILDENAFIL (REVATIO) 20 MG TABLET    Take 2-3 tablets by mouth as needed, as directed Discontinue if having headaches.  Modified Medications   Modified Medication Previous Medication   OXYCODONE-ACETAMINOPHEN (PERCOCET) 10-325 MG TABLET oxyCODONE-acetaminophen (PERCOCET) 10-325 MG tablet      Take 1 tablet by mouth every 6 (six) hours as needed for pain.    Take 1 tablet by mouth every 6 (six) hours as needed for pain.  Discontinued Medications   No medications on file   ----------------------------------------------------------------------------------------------------------------------  Follow-up: Return in about 2 months (around 10/29/2019) for evaluation, med refill.    Molli Barrows, MD

## 2019-08-30 ENCOUNTER — Other Ambulatory Visit: Payer: Self-pay | Admitting: Endocrinology

## 2019-09-05 ENCOUNTER — Other Ambulatory Visit: Payer: Self-pay | Admitting: Endocrinology

## 2019-09-06 NOTE — Telephone Encounter (Signed)
Patient called asking about his metformin refill. Please advise. He is out of this medication.

## 2019-09-07 ENCOUNTER — Other Ambulatory Visit: Payer: Self-pay | Admitting: Endocrinology

## 2019-09-09 ENCOUNTER — Other Ambulatory Visit: Payer: Self-pay | Admitting: Endocrinology

## 2019-09-11 ENCOUNTER — Other Ambulatory Visit: Payer: Medicaid Other

## 2019-09-12 ENCOUNTER — Other Ambulatory Visit: Payer: Self-pay

## 2019-09-12 ENCOUNTER — Telehealth: Payer: Self-pay

## 2019-09-12 ENCOUNTER — Other Ambulatory Visit (INDEPENDENT_AMBULATORY_CARE_PROVIDER_SITE_OTHER): Payer: 59

## 2019-09-12 DIAGNOSIS — E119 Type 2 diabetes mellitus without complications: Secondary | ICD-10-CM

## 2019-09-12 LAB — COMPREHENSIVE METABOLIC PANEL
ALT: 59 U/L — ABNORMAL HIGH (ref 0–53)
AST: 30 U/L (ref 0–37)
Albumin: 3.9 g/dL (ref 3.5–5.2)
Alkaline Phosphatase: 43 U/L (ref 39–117)
BUN: 27 mg/dL — ABNORMAL HIGH (ref 6–23)
CO2: 32 mEq/L (ref 19–32)
Calcium: 9.1 mg/dL (ref 8.4–10.5)
Chloride: 105 mEq/L (ref 96–112)
Creatinine, Ser: 1.32 mg/dL (ref 0.40–1.50)
GFR: 65.84 mL/min (ref >60.00)
Glucose, Bld: 105 mg/dL — ABNORMAL HIGH (ref 70–99)
Potassium: 4 mEq/L (ref 3.5–5.1)
Sodium: 142 mEq/L (ref 135–145)
Total Bilirubin: 0.7 mg/dL (ref 0.2–1.2)
Total Protein: 6 g/dL (ref 6.0–8.3)

## 2019-09-12 LAB — BASIC METABOLIC PANEL
BUN: 27 mg/dL — ABNORMAL HIGH (ref 6–23)
CO2: 32 mEq/L (ref 19–32)
Calcium: 9.1 mg/dL (ref 8.4–10.5)
Chloride: 105 mEq/L (ref 96–112)
Creatinine, Ser: 1.32 mg/dL (ref 0.40–1.50)
GFR: 65.84 mL/min (ref >60.00)
Glucose, Bld: 105 mg/dL — ABNORMAL HIGH (ref 70–99)
Potassium: 4 mEq/L (ref 3.5–5.1)
Sodium: 142 mEq/L (ref 135–145)

## 2019-09-12 LAB — HEMOGLOBIN A1C: Hgb A1c MFr Bld: 6 % (ref 4.6–6.5)

## 2019-09-12 NOTE — Telephone Encounter (Signed)
Patient came in today and had lab work done he stated he is going to be out of town on 9/7 and wants to switch his visit to a VV-please advise if it is ok to switch to VV

## 2019-09-12 NOTE — Telephone Encounter (Signed)
Video visit okay

## 2019-09-12 NOTE — Telephone Encounter (Signed)
Visit changed to VV

## 2019-09-17 ENCOUNTER — Telehealth (INDEPENDENT_AMBULATORY_CARE_PROVIDER_SITE_OTHER): Payer: 59 | Admitting: Endocrinology

## 2019-09-17 ENCOUNTER — Encounter: Payer: Self-pay | Admitting: Endocrinology

## 2019-09-17 ENCOUNTER — Other Ambulatory Visit: Payer: Self-pay | Admitting: *Deleted

## 2019-09-17 ENCOUNTER — Other Ambulatory Visit: Payer: Self-pay

## 2019-09-17 VITALS — Wt 207.0 lb

## 2019-09-17 DIAGNOSIS — E785 Hyperlipidemia, unspecified: Secondary | ICD-10-CM

## 2019-09-17 DIAGNOSIS — E119 Type 2 diabetes mellitus without complications: Secondary | ICD-10-CM | POA: Diagnosis not present

## 2019-09-17 MED ORDER — GLUCOSE BLOOD VI STRP: 1.0000 | ORAL_STRIP | 0 refills | Status: DC | PRN

## 2019-09-17 MED ORDER — PIOGLITAZONE HCL 30 MG PO TABS
30.0000 mg | ORAL_TABLET | Freq: Every day | ORAL | 1 refills | Status: DC
Start: 2019-09-17 — End: 2020-02-13

## 2019-09-17 MED ORDER — METFORMIN HCL 1000 MG PO TABS
1000.0000 mg | ORAL_TABLET | Freq: Two times a day (BID) | ORAL | 1 refills | Status: DC
Start: 2019-09-17 — End: 2020-03-20

## 2019-09-17 MED ORDER — ONETOUCH VERIO W/DEVICE KIT: PACK | 0 refills | Status: DC

## 2019-09-17 MED ORDER — ONETOUCH DELICA LANCETS 33G MISC: 0 refills | Status: DC

## 2019-09-17 NOTE — Progress Notes (Signed)
Patient ID: Cameron Cardenas, male   DOB: 16-Mar-1955, 64 y.o.   MRN: [id number]   Reason for Appointment: Endocrinology follow-up   I connected with the above-named patient by video enabled telemedicine application and verified that I am speaking with the correct person. The patient was explained the limitations of evaluation and management by telemedicine and the availability of in person appointments.  Patient also understood that there may be a patient responsible charge related to this service  Location of the patient: Patient's home  Location of the provider: Physician office Only the patient and myself were participating in the encounter The patient understood the above statements and agreed to proceed.   History of Present Illness   Diagnosis: Type 2 DIABETES MELITUS, date of diagnosis: 2004  PAST history: He had mild diabetes at onset and was treated with metformin and subsequently Actoplusmet In 2013 he had changed his diet significantly and started exercising. This helped him with weight loss Subsequently his blood sugars have been excellent with A1c upper normal  RECENT history:   Oral hypoglycemic drugs: Actoplusmet 15/500 twice a day     His A1c is slightly higher at 6.1 compared to 5.8  Current management, problems and blood sugar patterns:  His blood sugars are better compared to his last visit especially fasting which were as much is 138  Still not checking readings after meals except once after lunch  He is again asking about doing the freestyle libre  He thinks he has gained weight up to 210  Does not do much walking because of knee pain  Also likely not controlling portions with his continued weight gain, previously had been as low as 187 pounds             Side effects from medications: None  Monitors blood glucose every other day: Using meter: One Touch  Blood sugars from patient review  PRE-MEAL Fasting Lunch Dinner Bedtime Overall   Glucose range:  110-118  113, 125  113 ?   Mean/median:     ?   Previous readings:  POST-MEAL PC Breakfast PC Lunch PC Dinner  Glucose range:  118-133  127  124-137  Mean/median:       Dietician visit: Most recent: 12/13               Wt Readings from Last 3 Encounters:  08/29/19 200 lb (90.7 kg)  04/22/19 198 lb (89.8 kg)  10/30/18 187 lb (84.8 kg)   Lab Results  Component Value Date   HGBA1C 6.0 09/12/2019   HGBA1C 6.1 05/20/2019   HGBA1C 5.8 10/30/2018   Lab Results  Component Value Date   MICROALBUR 1.7 05/20/2019   LDLCALC 58 05/20/2019   CREATININE 1.32 09/12/2019   CREATININE 1.32 09/12/2019    Lab on 09/12/2019  Component Date Value Ref Range Status   Sodium 09/12/2019 142  135 - 145 mEq/L Final   Potassium 09/12/2019 4.0  3.5 - 5.1 mEq/L Final   Chloride 09/12/2019 105  96 - 112 mEq/L Final   CO2 09/12/2019 32  19 - 32 mEq/L Final   Glucose, Bld 09/12/2019 105* 70 - 99 mg/dL Final   BUN 09/12/2019 27* 6 - 23 mg/dL Final   Creatinine, Ser 09/12/2019 1.32  0.40 - 1.50 mg/dL Final   GFR 09/12/2019 65.84  >60.00 mL/min Final   Calcium 09/12/2019 9.1  8.4 - 10.5 mg/dL Final   Sodium 09/12/2019 142  135 - 145 mEq/L Final  Potassium 09/12/2019 4.0  3.5 - 5.1 mEq/L Final   Chloride 09/12/2019 105  96 - 112 mEq/L Final   CO2 09/12/2019 32  19 - 32 mEq/L Final   Glucose, Bld 09/12/2019 105* 70 - 99 mg/dL Final   BUN 09/12/2019 27* 6 - 23 mg/dL Final   Creatinine, Ser 09/12/2019 1.32  0.40 - 1.50 mg/dL Final   Total Bilirubin 09/12/2019 0.7  0.2 - 1.2 mg/dL Final   Alkaline Phosphatase 09/12/2019 43  39 - 117 U/L Final   AST 09/12/2019 30  0 - 37 U/L Final   ALT 09/12/2019 59* 0 - 53 U/L Final   Total Protein 09/12/2019 6.0  6.0 - 8.3 g/dL Final   Albumin 09/12/2019 3.9  3.5 - 5.2 g/dL Final   GFR 09/12/2019 65.84  >60.00 mL/min Final   Calcium 09/12/2019 9.1  8.4 - 10.5 mg/dL Final   Hgb A1c MFr Bld 09/12/2019 6.0  4.6 - 6.5 %  Final   Glycemic Control Guidelines for People with Diabetes:Non Diabetic:  <6%Goal of Therapy: <7%Additional Action Suggested:  >8%      Allergies as of 09/17/2019   No Known Allergies     Medication List       Accurate as of September 17, 2019  9:33 AM. If you have any questions, ask your nurse or doctor.        aspirin 81 MG tablet Take 81 mg by mouth daily.   econazole nitrate 1 % cream Apply topically 2 (two) times daily.   ezetimibe 10 MG tablet Commonly known as: ZETIA Take 1 tablet by mouth once daily   fluticasone 50 MCG/ACT nasal spray Commonly known as: FLONASE USE 2 SPRAY(S) IN EACH NOSTRIL ONCE DAILY   metFORMIN 1000 MG tablet Commonly known as: GLUCOPHAGE Take 1 tablet (1,000 mg total) by mouth 2 (two) times daily with a meal.   olmesartan-hydrochlorothiazide 20-12.5 MG tablet Commonly known as: BENICAR HCT Take 1 tablet by mouth daily. Take 1 tablet by mouth once daily.   oxyCODONE-acetaminophen 10-325 MG tablet Commonly known as: Percocet Take 1 tablet by mouth every 6 (six) hours as needed for pain.   oxyCODONE-acetaminophen 10-325 MG tablet Commonly known as: Percocet Take 1 tablet by mouth every 8 (eight) hours as needed for pain. Start taking on: October 10, 2019   pioglitazone 30 MG tablet Commonly known as: ACTOS Take 1 tablet (30 mg total) by mouth daily.   pioglitazone-metformin 15-500 MG tablet Commonly known as: ACTOPLUS MET TAKE 1 TABLET BY MOUTH TWICE DAILY WITH A MEAL   rosuvastatin 20 MG tablet Commonly known as: CRESTOR Take 1 tablet by mouth once daily   sildenafil 20 MG tablet Commonly known as: REVATIO Take 2-3 tablets by mouth as needed, as directed Discontinue if having headaches.       Allergies:  No Known Allergies  Past Medical History:  Diagnosis Date   Allergic rhinitis due to pollen    Anal fissure    Bilateral sciatica 07/09/2018   Chronic pain syndrome 07/09/2018   Chronic, continuous use of  opioids 07/09/2018   DDD (degenerative disc disease), lumbar    Essential hypertension, malignant    Hemorrhoids    Lumbago    Lumbar post-laminectomy syndrome 07/09/2018   Plantar fasciitis of left foot    Pure hypercholesterolemia    Spinal stenosis of lumbar region 07/09/2018   Type II or unspecified type diabetes mellitus without mention of complication, not stated as uncontrolled    dx in  2005   Unspecified vitamin D deficiency     Past Surgical History:  Procedure Laterality Date   COLONOSCOPY     in [facility], MD no longer in practice, does not recall the name of facility. Does belive polyps were removed   [place] [facility]  2011   total of 3 sx on back per pt   MAXIMUM ACCESS (MAS)POSTERIOR LUMBAR INTERBODY FUSION (PLIF) 1 LEVEL N/A 06/10/2014   Procedure: Redo Lumbar Five-Sacral One Decompression with maximum access posterior lumbar interbody fusion;  Surgeon: Erline Levine, MD;  Location: [place] [facility];  Service: Neurosurgery;  Laterality: N/A;  Redo Decompression with maximum access posterior lumbar interbody fusion, L5-S1    Family History  Problem Relation Age of Onset   Hypertension Mother    Kidney disease Mother    Diabetes Mother    Hypertension Father    Diabetes Sister    Cataracts Sister    Seizures Brother        caused his death   Colon cancer Neg Hx    Esophageal cancer Neg Hx    Rectal cancer Neg Hx    Stomach cancer Neg Hx    Heart disease Neg Hx    Colon polyps Neg Hx     Social History:  reports that he quit smoking about 8 months ago. His smoking use included cigars. He has never used smokeless tobacco. He reports previous alcohol use. He reports previous drug use. Drug: Marijuana.   Lab on 09/12/2019  Component Date Value Ref Range Status   Sodium 09/12/2019 142  135 - 145 mEq/L Final   Potassium 09/12/2019 4.0  3.5 - 5.1 mEq/L Final   Chloride 09/12/2019 105  96 - 112 mEq/L Final   CO2 09/12/2019 32  19 - 32 mEq/L  Final   Glucose, Bld 09/12/2019 105* 70 - 99 mg/dL Final   BUN 09/12/2019 27* 6 - 23 mg/dL Final   Creatinine, Ser 09/12/2019 1.32  0.40 - 1.50 mg/dL Final   GFR 09/12/2019 65.84  >60.00 mL/min Final   Calcium 09/12/2019 9.1  8.4 - 10.5 mg/dL Final   Sodium 09/12/2019 142  135 - 145 mEq/L Final   Potassium 09/12/2019 4.0  3.5 - 5.1 mEq/L Final   Chloride 09/12/2019 105  96 - 112 mEq/L Final   CO2 09/12/2019 32  19 - 32 mEq/L Final   Glucose, Bld 09/12/2019 105* 70 - 99 mg/dL Final   BUN 09/12/2019 27* 6 - 23 mg/dL Final   Creatinine, Ser 09/12/2019 1.32  0.40 - 1.50 mg/dL Final   Total Bilirubin 09/12/2019 0.7  0.2 - 1.2 mg/dL Final   Alkaline Phosphatase 09/12/2019 43  39 - 117 U/L Final   AST 09/12/2019 30  0 - 37 U/L Final   ALT 09/12/2019 59* 0 - 53 U/L Final   Total Protein 09/12/2019 6.0  6.0 - 8.3 g/dL Final   Albumin 09/12/2019 3.9  3.5 - 5.2 g/dL Final   GFR 09/12/2019 65.84  >60.00 mL/min Final   Calcium 09/12/2019 9.1  8.4 - 10.5 mg/dL Final   Hgb A1c MFr Bld 09/12/2019 6.0  4.6 - 6.5 % Final   Glycemic Control Guidelines for People with Diabetes:Non Diabetic:  <6%Goal of Therapy: <7%Additional Action Suggested:  >8%     Review of Systems:  HYPERTENSION:   Treated with Benicar HCT, followed by PCP  Home blood pressure check periodically   BP Readings from Last 3 Encounters:  08/29/19 135/85  10/30/18 102/78  10/28/18  93/69     HYPERLIPIDEMIA: The lipid abnormality consists of elevated LDL  He had been on 40 mg atorvastatin since 12/13 and this was increased to 80 mg in 6/16 Previously did have LDL particle number of 1348 previously and which was relatively high at 1216 in 6/16  With Crestor 20 mg and Zetia daily his LDL is improved further  Labs as follows:    Lab Results  Component Value Date   CHOL 136 05/20/2019   CHOL 155 10/30/2018   CHOL 146 10/16/2017   Lab Results  Component Value Date   HDL 65.10 05/20/2019   HDL 69.40  10/30/2018   HDL 79.30 10/16/2017   Lab Results  Component Value Date   LDLCALC 58 05/20/2019   LDLCALC 72 10/30/2018   LDLCALC 59 10/16/2017   Lab Results  Component Value Date   TRIG 66.0 05/20/2019   TRIG 64.0 10/30/2018   TRIG 41.0 10/16/2017   Lab Results  Component Value Date   CHOLHDL 2 05/20/2019   CHOLHDL 2 10/30/2018   CHOLHDL 2 10/16/2017   No results found for: LDLDIRECT  Lab Results  Component Value Date   CHOL 136 05/20/2019   HDL 65.10 05/20/2019   LDLCALC 58 05/20/2019   TRIG 66.0 05/20/2019   CHOLHDL 2 05/20/2019        He had a foot exam in 5/20    Examination:   There were no vitals taken for this visit.  There is no height or weight on file to calculate BMI.      ASSESSMENT/ PLAN:   Diabetes type 2, last BMI 24  He has had mild diabetes with consistently good A1c levels in the normal range   He has been on Actos 30 mg and Metformin 1 g twice daily now  Also his blood sugars are excellent including fasting readings at home he has gained weight This is likely to be from inconsistent diet and exercise regimen  Recommendations: No change in medication To check more readings after meals instead of just in the morning Encouraged him to start going to the [facility] for using recumbent bike or other nonweightbearing exercises since he has knee pain   Follow-up in 6 months   There are no Patient Instructions on file for this visit.  Elayne Snare 09/17/2019, 9:33 AM     Note: This office note was prepared with Dragon voice recognition system technology. Any transcriptional errors that result from this process are unintentional.

## 2019-09-30 ENCOUNTER — Ambulatory Visit: Payer: 59 | Admitting: Podiatry

## 2019-10-11 ENCOUNTER — Telehealth: Payer: Self-pay | Admitting: Anesthesiology

## 2019-10-11 NOTE — Telephone Encounter (Signed)
Patient lvmail stating Dr. Andree Elk has sent in the wrong script for him. Please call

## 2019-10-11 NOTE — Telephone Encounter (Signed)
Spoke with Dr Andree Elk and he states he will reorder medication.

## 2019-10-13 MED ORDER — OXYCODONE-ACETAMINOPHEN 10-325 MG PO TABS: 1.0000 | ORAL_TABLET | Freq: Four times a day (QID) | ORAL | 0 refills | Status: DC | PRN

## 2019-10-13 NOTE — Addendum Note (Signed)
Addended by: Molli Barrows on: 10/13/2019 08:26 AM   Modules accepted: Orders

## 2019-10-14 ENCOUNTER — Telehealth: Payer: Self-pay

## 2019-10-14 NOTE — Telephone Encounter (Signed)
Received verbal order from Dr Andree Elk to have the patient fill the prescription that he has and take it q6 hours.  He will run out early, but Dr Andree Elk will change his next prescription.  He was unable to get his computer working on Friday to get this reordered. Attempted to call patient this am to notify him but there was no answer.

## 2019-10-14 NOTE — Telephone Encounter (Signed)
Patient states he spoke with Dr Andree Elk and got the right prescription filled.

## 2019-10-15 ENCOUNTER — Telehealth: Payer: Self-pay | Admitting: Anesthesiology

## 2019-10-15 NOTE — Telephone Encounter (Signed)
Pharmacy called and states the problem is fixed.

## 2019-10-15 NOTE — Telephone Encounter (Signed)
Pharmacy called about medication instructions. Please call to verify so patient can pick up meds.

## 2019-10-25 ENCOUNTER — Other Ambulatory Visit: Payer: Self-pay

## 2019-10-25 ENCOUNTER — Ambulatory Visit: Payer: 59 | Attending: Anesthesiology | Admitting: Anesthesiology

## 2019-10-25 ENCOUNTER — Encounter: Payer: Self-pay | Admitting: Anesthesiology

## 2019-10-25 DIAGNOSIS — F1190 Opioid use, unspecified, uncomplicated: Secondary | ICD-10-CM

## 2019-10-25 DIAGNOSIS — M48062 Spinal stenosis, lumbar region with neurogenic claudication: Secondary | ICD-10-CM | POA: Diagnosis not present

## 2019-10-25 DIAGNOSIS — M51369 Other intervertebral disc degeneration, lumbar region without mention of lumbar back pain or lower extremity pain: Secondary | ICD-10-CM

## 2019-10-25 DIAGNOSIS — G894 Chronic pain syndrome: Secondary | ICD-10-CM | POA: Diagnosis not present

## 2019-10-25 DIAGNOSIS — M5126 Other intervertebral disc displacement, lumbar region: Secondary | ICD-10-CM

## 2019-10-25 DIAGNOSIS — M961 Postlaminectomy syndrome, not elsewhere classified: Secondary | ICD-10-CM | POA: Diagnosis not present

## 2019-10-25 DIAGNOSIS — M5432 Sciatica, left side: Secondary | ICD-10-CM

## 2019-10-25 DIAGNOSIS — M5136 Other intervertebral disc degeneration, lumbar region: Secondary | ICD-10-CM

## 2019-10-25 DIAGNOSIS — M5431 Sciatica, right side: Secondary | ICD-10-CM

## 2019-10-25 MED ORDER — OXYCODONE-ACETAMINOPHEN 10-325 MG PO TABS: 1.0000 | ORAL_TABLET | Freq: Four times a day (QID) | ORAL | 0 refills | Status: DC | PRN

## 2019-10-25 MED ORDER — OXYCODONE-ACETAMINOPHEN 10-325 MG PO TABS
1.0000 | ORAL_TABLET | Freq: Four times a day (QID) | ORAL | 0 refills | Status: DC | PRN
Start: 2019-11-12 — End: 2019-11-12

## 2019-10-25 NOTE — Progress Notes (Signed)
Virtual Visit via Telephone Note  I connected with Cameron Cardenas on 10/25/19 at  1:30 PM EDT by telephone and verified that I am speaking with the correct person using two identifiers.  Location: Patient: Home Provider: Pain control center   I discussed the limitations, risks, security and privacy concerns of performing an evaluation and management service by telephone and the availability of in person appointments. I also discussed with the patient that there may be a patient responsible charge related to this service. The patient expressed understanding and agreed to proceed.   History of Present Illness: I spoke with Cameron Cardenas via telephone as he was not able to do the video portion of the virtual conference but he reports that he is doing pretty well.  He is try to stay active and walking on a regular basis.  He is also trying to do stretching which does seem to help some of his back pain.  He continues to require his daily medication and is using Percocet 10/13/2023 approximately 4 times a day and this continues to work well to keep him active.  He still has some pain with the breakthrough despite this dosing but without the medication he is incapacitated he reports.  He is sleeping better with the medicine and continues to derive good functional benefit.  The quality characteristic and distribution of the pain is otherwise unchanged.  His strength the lower extremities appears to be stable   Observations/Objective:  Current Outpatient Medications:  .  aspirin 81 MG tablet, Take 81 mg by mouth daily., Disp: , Rfl:  .  Blood Glucose Monitoring Suppl (ONETOUCH VERIO) w/Device KIT, Use to check blood sugars once daily, Disp: 1 kit, Rfl: 0 .  econazole nitrate 1 % cream, Apply topically 2 (two) times daily., Disp: , Rfl:  .  ezetimibe (ZETIA) 10 MG tablet, Take 1 tablet by mouth once daily, Disp: 90 tablet, Rfl: 0 .  fluticasone (FLONASE) 50 MCG/ACT nasal spray, USE 2 SPRAY(S) IN EACH  NOSTRIL ONCE DAILY, Disp: , Rfl:  .  glucose blood test strip, 1 each by Other route as needed for other. One Touch Verio- Use to test blood sugars once daily, Disp: 100 each, Rfl: 0 .  metFORMIN (GLUCOPHAGE) 1000 MG tablet, Take 1 tablet (1,000 mg total) by mouth 2 (two) times daily with a meal., Disp: 180 tablet, Rfl: 1 .  olmesartan-hydrochlorothiazide (BENICAR HCT) 20-12.5 MG tablet, Take 1 tablet by mouth daily. Take 1 tablet by mouth once daily., Disp: , Rfl:  .  OneTouch Delica Lancets 47S MISC, Use to test blood sugars once daily, Disp: 100 each, Rfl: 0 .  oxyCODONE-acetaminophen (PERCOCET) 10-325 MG tablet, Take 1 tablet by mouth every 6 (six) hours as needed for pain., Disp: 120 tablet, Rfl: 0 .  pioglitazone (ACTOS) 30 MG tablet, Take 1 tablet (30 mg total) by mouth daily., Disp: 90 tablet, Rfl: 1 .  rosuvastatin (CRESTOR) 20 MG tablet, Take 1 tablet by mouth once daily, Disp: 90 tablet, Rfl: 1 .  sildenafil (REVATIO) 20 MG tablet, Take 2-3 tablets by mouth as needed, as directed Discontinue if having headaches., Disp: 30 tablet, Rfl: 1  Assessment and Plan:  1. Spinal stenosis of lumbar region with neurogenic claudication   2. Chronic, continuous use of opioids   3. Chronic pain syndrome   4. Bilateral sciatica   5. Lumbar post-laminectomy syndrome   6. DDD (degenerative disc disease), lumbar   7. Failed back syndrome of lumbar spine  8. Herniated lumbar intervertebral disc   Based on our discussion today and upon review of the [facility] practitioner database information I am going to refill his medicines for the next 2 months.  This will be dated for November 2 and December 2.  No other changes are made in regards to his pain management regimen.  I encouraged him to continue efforts at weight loss stretching strengthening and to stay active with walking.  He is scheduled for return to clinic in 2 months. Follow Up Instructions:    I discussed the assessment and treatment  plan with the patient. The patient was provided an opportunity to ask questions and all were answered. The patient agreed with the plan and demonstrated an understanding of the instructions.   The patient was advised to call back or seek an in-person evaluation if the symptoms worsen or if the condition fails to improve as anticipated.  I provided 30 minutes of non-face-to-face time during this encounter.   Molli Barrows, MD

## 2019-11-12 ENCOUNTER — Telehealth: Payer: Self-pay

## 2019-11-12 MED ORDER — OXYCODONE-ACETAMINOPHEN 10-325 MG PO TABS
1.0000 | ORAL_TABLET | Freq: Four times a day (QID) | ORAL | 0 refills | Status: DC | PRN
Start: 2019-11-12 — End: 2019-12-09

## 2019-11-12 NOTE — Telephone Encounter (Signed)
He said he had a virtual with Dr. Andree Elk a few weeks ago and he was supposed to refill his scripts and none of them went through. He called [facility] pharmacy and they have nothing.He wants someone to please call him back. He needs his medicine.

## 2019-11-12 NOTE — Telephone Encounter (Signed)
Patient notified that a script for Oxy/Acet was sent to pharmacy for one month. Instructed patient to call for next month's script.

## 2019-11-12 NOTE — Telephone Encounter (Signed)
I called the pharmacy to verify this. They do not have scripts for Oxycodone/Acet with dates of 11-12-19 or 12-12-19. Will you send these? He is a Dr. Andree Elk patient.

## 2019-12-09 ENCOUNTER — Encounter: Payer: Self-pay | Admitting: Anesthesiology

## 2019-12-09 ENCOUNTER — Ambulatory Visit: Payer: 59 | Attending: Anesthesiology | Admitting: Anesthesiology

## 2019-12-09 DIAGNOSIS — F1120 Opioid dependence, uncomplicated: Secondary | ICD-10-CM

## 2019-12-09 DIAGNOSIS — M5431 Sciatica, right side: Secondary | ICD-10-CM | POA: Diagnosis not present

## 2019-12-09 DIAGNOSIS — F1190 Opioid use, unspecified, uncomplicated: Secondary | ICD-10-CM

## 2019-12-09 DIAGNOSIS — M48062 Spinal stenosis, lumbar region with neurogenic claudication: Secondary | ICD-10-CM

## 2019-12-09 DIAGNOSIS — G894 Chronic pain syndrome: Secondary | ICD-10-CM

## 2019-12-09 DIAGNOSIS — M5432 Sciatica, left side: Secondary | ICD-10-CM

## 2019-12-09 DIAGNOSIS — M5136 Other intervertebral disc degeneration, lumbar region: Secondary | ICD-10-CM

## 2019-12-09 DIAGNOSIS — M961 Postlaminectomy syndrome, not elsewhere classified: Secondary | ICD-10-CM

## 2019-12-09 MED ORDER — OXYCODONE-ACETAMINOPHEN 10-325 MG PO TABS
1.0000 | ORAL_TABLET | Freq: Four times a day (QID) | ORAL | 0 refills | Status: DC | PRN
Start: 2020-01-10 — End: 2019-12-23

## 2019-12-09 MED ORDER — OXYCODONE-ACETAMINOPHEN 10-325 MG PO TABS
1.0000 | ORAL_TABLET | Freq: Four times a day (QID) | ORAL | 0 refills | Status: DC | PRN
Start: 2019-12-12 — End: 2019-12-31

## 2019-12-09 NOTE — Progress Notes (Signed)
Virtual Visit via Telephone Note  I connected with Cameron Cardenas on 12/09/19 at  2:00 PM EST by telephone and verified that I am speaking with the correct person using two identifiers.  Location: Patient: Home Provider: Pain control center   I discussed the limitations, risks, security and privacy concerns of performing an evaluation and management service by telephone and the availability of in person appointments. I also discussed with the patient that there may be a patient responsible charge related to this service. The patient expressed understanding and agreed to proceed.   History of Present Illness: I spoke with Cameron Cardenas today via telephone as we were unable to connect for the video portion of the virtual conference.  He states that his low back pain is been stable in nature without any recent changes.  The pain has been recalcitrant but is responding to his opioid therapy.  This continues to work well for him.  He reports no side effects with the medication and good relief.  He generally gets approximately 80% relief lasting about 4 to 6 hours or more with his medication management.  He continues to try to do physical therapy exercises to stay active.  Otherwise he is in his usual state of health no new changes in the quality of his pain or problems with lower extremity strength or function or changes of bowel or bladder function.   Observations/Objective:  Current Outpatient Medications:  .  aspirin 81 MG tablet, Take 81 mg by mouth daily., Disp: , Rfl:  .  Blood Glucose Monitoring Suppl (ONETOUCH VERIO) w/Device KIT, Use to check blood sugars once daily, Disp: 1 kit, Rfl: 0 .  econazole nitrate 1 % cream, Apply topically 2 (two) times daily., Disp: , Rfl:  .  ezetimibe (ZETIA) 10 MG tablet, Take 1 tablet by mouth once daily, Disp: 90 tablet, Rfl: 0 .  fluticasone (FLONASE) 50 MCG/ACT nasal spray, USE 2 SPRAY(S) IN EACH NOSTRIL ONCE DAILY, Disp: , Rfl:  .  glucose blood test  strip, 1 each by Other route as needed for other. One Touch Verio- Use to test blood sugars once daily, Disp: 100 each, Rfl: 0 .  metFORMIN (GLUCOPHAGE) 1000 MG tablet, Take 1 tablet (1,000 mg total) by mouth 2 (two) times daily with a meal., Disp: 180 tablet, Rfl: 1 .  olmesartan-hydrochlorothiazide (BENICAR HCT) 20-12.5 MG tablet, Take 1 tablet by mouth daily. Take 1 tablet by mouth once daily., Disp: , Rfl:  .  OneTouch Delica Lancets 70Y MISC, Use to test blood sugars once daily, Disp: 100 each, Rfl: 0 .  [START ON 12/12/2019] oxyCODONE-acetaminophen (PERCOCET) 10-325 MG tablet, Take 1 tablet by mouth every 6 (six) hours as needed for pain., Disp: 90 tablet, Rfl: 0 .  [START ON 01/10/2020] oxyCODONE-acetaminophen (PERCOCET) 10-325 MG tablet, Take 1 tablet by mouth every 6 (six) hours as needed for pain., Disp: 90 tablet, Rfl: 0 .  pioglitazone (ACTOS) 30 MG tablet, Take 1 tablet (30 mg total) by mouth daily., Disp: 90 tablet, Rfl: 1 .  rosuvastatin (CRESTOR) 20 MG tablet, Take 1 tablet by mouth once daily, Disp: 90 tablet, Rfl: 1 .  sildenafil (REVATIO) 20 MG tablet, Take 2-3 tablets by mouth as needed, as directed Discontinue if having headaches., Disp: 30 tablet, Rfl: 1  Assessment and Plan:  1. Spinal stenosis of lumbar region with neurogenic claudication   2. Chronic, continuous use of opioids   3. Chronic pain syndrome   4. Bilateral sciatica   5.  Lumbar post-laminectomy syndrome   6. DDD (degenerative disc disease), lumbar   7. Failed back syndrome of lumbar spine   Based on our discussion today and upon review of the [place] practitioner database information going to refill his medications for December 2 and December 31 to avoid the holiday.  We will schedule him for return to clinic in 2 months.  I have continued to encourage him with physical therapy exercises and efforts at weight loss.  He is also encouraged to follow-up with his primary care physicians for his baseline  medical care.  No other changes will be made in his pain management protocol at this time. Follow Up Instructions:    I discussed the assessment and treatment plan with the patient. The patient was provided an opportunity to ask questions and all were answered. The patient agreed with the plan and demonstrated an understanding of the instructions.   The patient was advised to call back or seek an in-person evaluation if the symptoms worsen or if the condition fails to improve as anticipated.  I provided 30 minutes of non-face-to-face time during this encounter.   Molli Barrows, MD

## 2019-12-18 ENCOUNTER — Telehealth: Payer: 59 | Admitting: Anesthesiology

## 2019-12-22 NOTE — Progress Notes (Signed)
Cardiology Office Note:    Date:  12/23/2019   ID:  Cameron Cardenas, DOB 1955-05-01, MRN [id number]  PCP:  Merrilee Seashore, MD  Cardiologist:  Sinclair Grooms, MD   Referring MD: Merrilee Seashore, MD   Chief Complaint  Patient presents with  . Coronary Artery Disease  . Chest Pain  . Hyperlipidemia  . Hypertension  . Diabetes Mellitus    History of Present Illness:    Cameron Cardenas is a 64 y.o. male with a hx of hyperlipidemia, non-obstructive CAD, type 2 diabetes mellitus, essential hypertension, abnormal EKG with bifascicular block , and longstanding history of chest pain.  The patient complains of chest discomfort in the left subclavicular and precordial region radiating to the left shoulder and into the left arm.  The discomfort is random.  It can last up to 30 minutes.  He began having the discomfort approximately 2 weeks ago.  I do note that he has had chest discomfort of similar nature in the past that has led to a low risk myocardial perfusion study 11 years ago, and a coronary CT angio and 2020 that demonstrated a calcium score 14 and nonobstructive atherosclerosis in all 3 vessels.  His current symptoms are not associated with dyspnea, palpitations, orthopnea, diaphoresis, nausea, or vomiting.  He is retired from work and therefore does not get repetitive physical activity.  He does not smoke.  Diabetes was initially diagnosed in 2005  The patient saw his primary care physician, Dr. Loreen Freud and on 12/20/2019.  He was having intermittent ongoing chest pain and was referred to the emergency room.  The patient refused to go.  Laboratory data was obtained by Dr. Ashby Dawes but is not available for today's office visit.Marland Kitchen  Past Medical History:  Diagnosis Date  . Allergic rhinitis due to pollen   . Anal fissure   . Bilateral sciatica 07/09/2018  . Chronic pain syndrome 07/09/2018  . Chronic, continuous use of opioids 07/09/2018  . DDD (degenerative disc  disease), lumbar   . Essential hypertension, malignant   . Hemorrhoids   . Lumbago   . Lumbar post-laminectomy syndrome 07/09/2018  . Plantar fasciitis of left foot   . Pure hypercholesterolemia   . Spinal stenosis of lumbar region 07/09/2018  . Type II or unspecified type diabetes mellitus without mention of complication, not stated as uncontrolled    dx in 2005  . Unspecified vitamin D deficiency     Past Surgical History:  Procedure Laterality Date  . COLONOSCOPY     in [facility], MD no longer in practice, does not recall the name of facility. Does belive polyps were removed  . LUMBAR DISC SURGERY  2011   total of 3 sx on back per pt  . MAXIMUM ACCESS (MAS)POSTERIOR LUMBAR INTERBODY FUSION (PLIF) 1 LEVEL N/A 06/10/2014   Procedure: Redo Lumbar Five-Sacral One Decompression with maximum access posterior lumbar interbody fusion;  Surgeon: Erline Levine, MD;  Location: [facility];  Service: Neurosurgery;  Laterality: N/A;  Redo Decompression with maximum access posterior lumbar interbody fusion, L5-S1    Current Medications: Current Meds  Medication Sig  . aspirin 81 MG tablet Take 81 mg by mouth daily.  . Blood Glucose Monitoring Suppl (ONETOUCH VERIO) w/Device KIT Use to check blood sugars once daily  . econazole nitrate 1 % cream Apply topically 2 (two) times daily.  Marland Kitchen ezetimibe (ZETIA) 10 MG tablet Take 1 tablet by mouth once daily  . fluticasone (FLONASE) 50 MCG/ACT nasal spray  USE 2 SPRAY(S) IN EACH NOSTRIL ONCE DAILY  . glucose blood test strip 1 each by Other route as needed for other. One Touch Verio- Use to test blood sugars once daily  . metFORMIN (GLUCOPHAGE) 1000 MG tablet Take 1 tablet (1,000 mg total) by mouth 2 (two) times daily with a meal.  . olmesartan-hydrochlorothiazide (BENICAR HCT) 20-12.5 MG tablet Take 1 tablet by mouth daily. Take 1 tablet by mouth once daily.  Glory Rosebush Delica Lancets 16W MISC Use to test blood sugars once daily  . oxyCODONE-acetaminophen  (PERCOCET) 10-325 MG tablet Take 1 tablet by mouth every 6 (six) hours as needed for pain.  . pioglitazone (ACTOS) 30 MG tablet Take 1 tablet (30 mg total) by mouth daily.  . rosuvastatin (CRESTOR) 20 MG tablet Take 1 tablet by mouth once daily  . sildenafil (REVATIO) 20 MG tablet Take 2-3 tablets by mouth as needed, as directed Discontinue if having headaches.     Allergies:   Patient has no known allergies.   Social History   Socioeconomic History  . Marital status: Married    Spouse name: Not on file  . Number of children: 1  . Years of education: 28  . Highest education level: High school graduate  Occupational History  . Occupation: disabled  Tobacco Use  . Smoking status: Former Smoker    Types: Cigars    Quit date: 01/11/2019    Years since quitting: 0.9  . Smokeless tobacco: Never Used  Vaping Use  . Vaping Use: Never used  Substance and Sexual Activity  . Alcohol use: Not Currently    Comment: 6 pack beer lasts a month  . Drug use: Not Currently    Types: Marijuana  . Sexual activity: Not on file  Other Topics Concern  . Not on file  Social History Narrative   Lives with wife in a one story home.  Has one child.     He is on disability since January 2020, stopped working in February 2019 for low back pain.  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     Education: high school.     Social Determinants of Health   Financial Resource Strain: Not on file  Food Insecurity: Not on file  Transportation Needs: Not on file  Physical Activity: Not on file  Stress: Not on file  Social Connections: Not on file     Family History: The patient's family history includes Cataracts in his sister; Diabetes in his mother and sister; Hypertension in his father and mother; Kidney disease in his mother; Seizures in his brother. There is no history of Colon cancer, Esophageal cancer, Rectal cancer, Stomach cancer, Heart disease, or Colon polyps.  ROS:   Please see the history of present illness.     Lumbar disc disease.  No history of cervical disc disease.  Compliant with medications.  Denies family history of CAD.  All other systems reviewed and are negative.  EKGs/Labs/Other Studies Reviewed:    The following studies were reviewed today:  CORONARY CTA 2020: IMPRESSION: 1. Coronary calcium score of 14. This was 48 percentile for age and sex matched control.  2. Normal coronary origin with right dominance.  3. Minimal non-obstructive CAD. Continue with risk factor Modifications.  CARDIAC EVENT MONITOR 03/2018: Study Highlights   Underlying rhythm is normal sinus rhythm  Normal heart rate range  No significant arrhythmias. Specifically no AV block, atrial fibrillation, or VT.  Occasional PAC  Chest pain does not correlate with rhythm  EKG:  EKG sinus rhythm, right bundle branch block, left axis deviation.  Tracing performed on 12/20/2019.  No change from prior tracing performed in August 2020.  Recent Labs: 09/12/2019: ALT 59 12/23/2019: BUN 16; Creatinine, Ser 0.92; Hemoglobin WILL FOLLOW; Platelets WILL FOLLOW; Potassium 4.2; Sodium 143  Recent Lipid Panel    Component Value Date/Time   CHOL 136 05/20/2019 1004   TRIG 66.0 05/20/2019 1004   HDL 65.10 05/20/2019 1004   CHOLHDL 2 05/20/2019 1004   VLDL 13.2 05/20/2019 1004   LDLCALC 58 05/20/2019 1004    Physical Exam:    VS:  BP 122/80   Pulse 75   Ht _0  (1.93 m)   Wt 206 lb (93.4 kg)   SpO2 94%   BMI 25.08 kg/m     Wt Readings from Last 3 Encounters:  12/23/19 206 lb (93.4 kg)  09/17/19 207 lb (93.9 kg)  08/29/19 200 lb (90.7 kg)     GEN: He is currently wearing a mask.  He is bearded.. No acute distress HEENT: Normal NECK: No JVD.  No carotid bruits are heard. LYMPHATICS: No lymphadenopathy CARDIAC:  RRR without murmur, gallop, or edema. VASCULAR:  Normal Pulses. No bruits. RESPIRATORY: Clear to auscultation without rales, wheezing or rhonchi  ABDOMEN: Soft, non-tender,  non-distended, No pulsatile mass. MUSCULOSKELETAL: No deformity  SKIN: Warm and dry NEUROLOGIC:  Alert and oriented x 3 PSYCHIATRIC:  Normal affect   ASSESSMENT:    1. Coronary artery disease involving native coronary artery of native heart without angina pectoris   2. Essential hypertension   3. Hyperlipidemia LDL goal <70   4. Type 2 diabetes mellitus with complication, without long-term current use of insulin ([place])   5. Bifascicular bundle branch block   6. Snoring   7. Educated about COVID-19 virus infection   8. Chest pain of uncertain etiology    PLAN:    In order of problems listed above:  1. The patient has had prior ischemia and anatomic evaluation in 2010 and 2000 respectively.  Each study was relatively low risk.  With ongoing symptoms, and a multitude of risk factors, we decided to proceed with coronary angiography.  Perhaps he has multivessel disease or distal small vessel disease that is not easily identifiable with the evaluation to this point. 2. Blood pressure control is excellent on the current therapy which includes olmesartan HCT, and low-salt diet. 3. Most recent LDL cholesterol was 66 in November 2021. 4. Hemoglobin A1c 6.11 November 2019.  Continue Glucophage, Actos, and observance of low carbohydrate diet. 5. Abnormal and unchanged from prior right bundle with left anterior hemiblock. 6. Not addressed on today's exam 7. Vaccinated and practicing social distancing. 8. Overall, the chest discomfort and location is concerning.  However, the patient has had greater than 10-year history of recurring episodes of chest and arm discomfort.  Perhaps he has cervical disc disease.  The patient was counseled to undergo left heart catheterization, coronary angiography, and possible percutaneous coronary intervention with stent implantation. The procedural risks and benefits were discussed in detail. The risks discussed included death, stroke, myocardial infarction,  life-threatening bleeding, limb ischemia, kidney injury, allergy, and possible emergency cardiac surgery. The risk of these significant complications were estimated to occur less than 1% of the time. After discussion, the patient has agreed to proceed.    Medication Adjustments/Labs and Tests Ordered: Current medicines are reviewed at length with the patient today.  Concerns regarding medicines are outlined above.  Orders Placed This Encounter  Procedures  . CBC  . Basic metabolic panel  . Troponin T, STAT ([facility])   Meds ordered this encounter  Medications  . nitroGLYCERIN (NITROSTAT) 0.4 MG SL tablet    Sig: Place 1 tablet (0.4 mg total) under the tongue every 5 (five) minutes as needed for chest pain.    Dispense:  25 tablet    Refill:  3    Patient Instructions  Medication Instructions:  1)  A prescription has been sent in for Nitroglycerin.  If you have chest pain that doesn't relieve quickly, place one tablet under your tongue and allow it to dissolve.  If no relief after 5 minutes, you may take another pill.  If no relief after 5 minutes, you may take a 3rd dose but you need to call 911 and report to ER immediately.  *If you need a refill on your cardiac medications before your next appointment, please call your pharmacy*   Lab Work: BMET, CBC and Troponin today  If you have labs (blood work) drawn today and your tests are completely normal, you will receive your results only by: Marland Kitchen MyChart Message (if you have MyChart) OR . A paper copy in the mail If you have any lab test that is abnormal or we need to change your treatment, we will call you to review the results.   Testing/Procedures: Your physician has requested that you have a cardiac catheterization. Cardiac catheterization is used to diagnose and/or treat various heart conditions. Doctors may recommend this procedure for a number of different reasons. The most common reason is to evaluate chest pain. Chest pain can  be a symptom of coronary artery disease (CAD), and cardiac catheterization can show whether plaque is narrowing or blocking your heart's arteries. This procedure is also used to evaluate the valves, as well as measure the blood flow and oxygen levels in different parts of your heart. For further information please visit HugeFiesta.[email or web address]. Please follow instruction sheet, as given.    Follow-Up: At [facility], you and your health needs are our priority.  As part of our continuing mission to provide you with exceptional heart care, we have created designated Provider Care Teams.  These Care Teams include your primary Cardiologist (physician) and Advanced Practice Providers (APPs -  Physician Assistants and Nurse Practitioners) who all work together to provide you with the care you need, when you need it.  We recommend signing up for the patient portal called "MyChart".  Sign up information is provided on this After Visit Summary.  MyChart is used to connect with patients for Virtual Visits (Telemedicine).  Patients are able to view lab/test results, encounter notes, upcoming appointments, etc.  Non-urgent messages can be sent to your provider as well.   To learn more about what you can do with MyChart, go to [email or web address].    Your next appointment:   2-3 week(s)  The format for your next appointment:   In Person  Provider:   You may see Sinclair Grooms, MD or one of the following Advanced Practice Providers on your designated Care Team:    Truitt Merle, NP  Cecilie Kicks, NP  Kathyrn Drown, NP    Other Instructions  Due to recent COVID-19 restrictions implemented by our local and state authorities and in an effort to keep both patients and staff as safe as possible, our hospital system requires COVID-19 testing prior to certain scheduled hospital procedures.  Please go to [address] [place], [place] [address] on Tuesday,  December 24, 2019 at 11:25AM. Dennis Bast must agree  to self-quarantine from the time of your testing until the procedure date on December 27, 2019.  This should included staying home with ONLY the people you live with.  Avoid take-out, grocery store shopping or leaving the house for any non-emergent reason.  Failure to have your COVID-19 test done on the date and time you have been scheduled will result in cancellation of your procedure.  Please call our office at [phone] if you have any questions.      [place] OFFICE [place], [place] [address] Dept: [phone] Loc: [place]  12/23/2019  You are scheduled for a Cardiac Catheterization on Friday, December 17 with Dr. Daneen Schick.  1. Please arrive at the [facility] (Main Entrance A) at [facility]: [address] [place], [place] [address] at 7:00 AM (This time is two hours before your procedure to ensure your preparation). Free valet parking service is available.   Special note: Every effort is made to have your procedure done on time. Please understand that emergencies sometimes delay scheduled procedures.  2. Diet: Do not eat solid foods after midnight.  The patient may have clear liquids until 5am upon the day of the procedure.  3. Labs: You will have labs drawn today  4. Medication instructions in preparation for your procedure:   Contrast Allergy: No    Do not take Diabetes Med Glucophage (Metformin) on the day of the procedure and HOLD 48 HOURS AFTER THE PROCEDURE.  On the morning of your procedure, take your Aspirin and any morning medicines NOT listed above.  You may use sips of water.  5. Plan for one night stay--bring personal belongings. 6. Bring a current list of your medications and current insurance cards. 7. You MUST have a responsible person to drive you home. 8. Someone MUST be with you the first 24 hours after you arrive  home or your discharge will be delayed. 9. Please wear clothes that are easy to get on and off and wear slip-on shoes.  Thank you for allowing [name] to care for you!   -- [place] [facility] [facility]      Signed, Sinclair Grooms, MD  12/23/2019 2:17 PM    [place]

## 2019-12-23 ENCOUNTER — Ambulatory Visit (INDEPENDENT_AMBULATORY_CARE_PROVIDER_SITE_OTHER): Payer: 59 | Admitting: Interventional Cardiology

## 2019-12-23 ENCOUNTER — Other Ambulatory Visit: Payer: Self-pay

## 2019-12-23 ENCOUNTER — Encounter: Payer: Self-pay | Admitting: Interventional Cardiology

## 2019-12-23 VITALS — BP 122/80 | HR 75 | Ht 76.0 in | Wt 206.0 lb

## 2019-12-23 DIAGNOSIS — E118 Type 2 diabetes mellitus with unspecified complications: Secondary | ICD-10-CM

## 2019-12-23 DIAGNOSIS — E785 Hyperlipidemia, unspecified: Secondary | ICD-10-CM

## 2019-12-23 DIAGNOSIS — I2510 Atherosclerotic heart disease of native coronary artery without angina pectoris: Secondary | ICD-10-CM

## 2019-12-23 DIAGNOSIS — Z7189 Other specified counseling: Secondary | ICD-10-CM

## 2019-12-23 DIAGNOSIS — I452 Bifascicular block: Secondary | ICD-10-CM

## 2019-12-23 DIAGNOSIS — R0683 Snoring: Secondary | ICD-10-CM

## 2019-12-23 DIAGNOSIS — I10 Essential (primary) hypertension: Secondary | ICD-10-CM

## 2019-12-23 DIAGNOSIS — R079 Chest pain, unspecified: Secondary | ICD-10-CM

## 2019-12-23 LAB — CBC
Hematocrit: 43.5 % (ref 37.5–51.0)
Hemoglobin: 14.9 g/dL (ref 13.0–17.7)
MCH: 28.4 pg (ref 26.6–33.0)
MCHC: 34.3 g/dL (ref 31.5–35.7)
MCV: 83 fL (ref 79–97)
Platelets: 97 10*3/uL — CL (ref 150–450)
RBC: 5.25 x10E6/uL (ref 4.14–5.80)
RDW: 15.4 % (ref 11.6–15.4)
WBC: 2.6 10*3/uL — ABNORMAL LOW (ref 3.4–10.8)

## 2019-12-23 LAB — BASIC METABOLIC PANEL
BUN/Creatinine Ratio: 17 (ref 10–24)
BUN: 16 mg/dL (ref 8–27)
CO2: 28 mmol/L (ref 20–29)
Calcium: 9.3 mg/dL (ref 8.6–10.2)
Chloride: 106 mmol/L (ref 96–106)
Creatinine, Ser: 0.92 mg/dL (ref 0.76–1.27)
GFR calc Af Amer: 101 mL/min/{1.73_m2} (ref >59)
GFR calc non Af Amer: 87 mL/min/{1.73_m2} (ref >59)
Glucose: 107 mg/dL — ABNORMAL HIGH (ref 65–99)
Potassium: 4.2 mmol/L (ref 3.5–5.2)
Sodium: 143 mmol/L (ref 134–144)

## 2019-12-23 LAB — TROPONIN T: Troponin T (Highly Sensitive): 8 ng/L (ref 0–22)

## 2019-12-23 MED ORDER — NITROGLYCERIN 0.4 MG SL SUBL: 0.4000 mg | SUBLINGUAL_TABLET | SUBLINGUAL | 3 refills | Status: DC | PRN

## 2019-12-23 NOTE — Patient Instructions (Signed)
Medication Instructions:  1)  A prescription has been sent in for Nitroglycerin.  If you have chest pain that doesn't relieve quickly, place one tablet under your tongue and allow it to dissolve.  If no relief after 5 minutes, you may take another pill.  If no relief after 5 minutes, you may take a 3rd dose but you need to call 911 and report to ER immediately.  *If you need a refill on your cardiac medications before your next appointment, please call your pharmacy*   Lab Work: BMET, CBC and Troponin today  If you have labs (blood work) drawn today and your tests are completely normal, you will receive your results only by: Marland Kitchen MyChart Message (if you have MyChart) OR . A paper copy in the mail If you have any lab test that is abnormal or we need to change your treatment, we will call you to review the results.   Testing/Procedures: Your physician has requested that you have a cardiac catheterization. Cardiac catheterization is used to diagnose and/or treat various heart conditions. Doctors may recommend this procedure for a number of different reasons. The most common reason is to evaluate chest pain. Chest pain can be a symptom of coronary artery disease (CAD), and cardiac catheterization can show whether plaque is narrowing or blocking your heart's arteries. This procedure is also used to evaluate the valves, as well as measure the blood flow and oxygen levels in different parts of your heart. For further information please visit HugeFiesta.[email or web address]. Please follow instruction sheet, as given.    Follow-Up: At [facility], you and your health needs are our priority.  As part of our continuing mission to provide you with exceptional heart care, we have created designated Provider Care Teams.  These Care Teams include your primary Cardiologist (physician) and Advanced Practice Providers (APPs -  Physician Assistants and Nurse Practitioners) who all work together to provide you with the care  you need, when you need it.  We recommend signing up for the patient portal called "MyChart".  Sign up information is provided on this After Visit Summary.  MyChart is used to connect with patients for Virtual Visits (Telemedicine).  Patients are able to view lab/test results, encounter notes, upcoming appointments, etc.  Non-urgent messages can be sent to your provider as well.   To learn more about what you can do with MyChart, go to [email or web address].    Your next appointment:   2-3 week(s)  The format for your next appointment:   In Person  Provider:   You may see Sinclair Grooms, MD or one of the following Advanced Practice Providers on your designated Care Team:    Truitt Merle, NP  Cecilie Kicks, NP  Kathyrn Drown, NP    Other Instructions  Due to recent COVID-19 restrictions implemented by our local and state authorities and in an effort to keep both patients and staff as safe as possible, our hospital system requires COVID-19 testing prior to certain scheduled hospital procedures.  Please go to [address]. [place], [place] [address] on Tuesday, December 24, 2019 at 11:25AM. Dennis Bast must agree to self-quarantine from the time of your testing until the procedure date on December 27, 2019.  This should included staying home with ONLY the people you live with.  Avoid take-out, grocery store shopping or leaving the house for any non-emergent reason.  Failure to have your COVID-19 test done on the date and time you have been scheduled will result in cancellation  of your procedure.  Please call our office at [phone] if you have any questions.      [place] OFFICE [place], [place] [address] Dept: [phone] Loc: [place]  12/23/2019  You are scheduled for a Cardiac Catheterization on Friday, December 17 with Dr. Daneen Schick.  1. Please  arrive at the [facility] (Main Entrance A) at [facility]: [address] [place], [place] [address] at 7:00 AM (This time is two hours before your procedure to ensure your preparation). Free valet parking service is available.   Special note: Every effort is made to have your procedure done on time. Please understand that emergencies sometimes delay scheduled procedures.  2. Diet: Do not eat solid foods after midnight.  The patient may have clear liquids until 5am upon the day of the procedure.  3. Labs: You will have labs drawn today  4. Medication instructions in preparation for your procedure:   Contrast Allergy: No    Do not take Diabetes Med Glucophage (Metformin) on the day of the procedure and HOLD 48 HOURS AFTER THE PROCEDURE.  On the morning of your procedure, take your Aspirin and any morning medicines NOT listed above.  You may use sips of water.  5. Plan for one night stay--bring personal belongings. 6. Bring a current list of your medications and current insurance cards. 7. You MUST have a responsible person to drive you home. 8. Someone MUST be with you the first 24 hours after you arrive home or your discharge will be delayed. 9. Please wear clothes that are easy to get on and off and wear slip-on shoes.  Thank you for allowing [name] to care for you!   -- [place] [facility]

## 2019-12-24 ENCOUNTER — Other Ambulatory Visit (HOSPITAL_COMMUNITY): Payer: 59

## 2019-12-24 ENCOUNTER — Ambulatory Visit: Payer: 59 | Admitting: Orthopaedic Surgery

## 2019-12-25 ENCOUNTER — Telehealth: Payer: Self-pay | Admitting: *Deleted

## 2019-12-25 NOTE — Telephone Encounter (Signed)
No answer, voicemail message left for patient to call.

## 2019-12-25 NOTE — Telephone Encounter (Signed)
LMTCB at phone number listed for patient, no answer phone number for wife (DPR).

## 2019-12-25 NOTE — Telephone Encounter (Signed)
It looks like patient did not show for pre-procedure COVID-19 appointment 12/24/19. Call placed to patient to discuss rescheduling appointment prior to [facility] 12/27/19.

## 2019-12-26 NOTE — Telephone Encounter (Signed)
Needs f/u OV when feels better. Markers were negative and this type CP is chronic not new(as he suggested during OV)

## 2019-12-26 NOTE — Telephone Encounter (Addendum)
I spoke with patient-pt reports he has fever and chills, has an appointment this afternoon to see his PCP. [facility] decision made to cancel [facility] 12/27/19, patient aware I will forward to Dr Tamala Julian for his review.

## 2019-12-26 NOTE — Telephone Encounter (Signed)
Left detailed message for pt that he needs to have pre-procedure COVID-19 done today prior to [facility] tomorrow, asked him to call before noon today to get COVID-19 test scheduled today.   LMTCB at phone number for wife (DPR).

## 2019-12-27 ENCOUNTER — Ambulatory Visit (HOSPITAL_COMMUNITY): Admission: RE | Admit: 2019-12-27 | Payer: 59 | Source: Home / Self Care | Admitting: Interventional Cardiology

## 2019-12-27 ENCOUNTER — Encounter (HOSPITAL_COMMUNITY): Admission: RE | Payer: Self-pay | Source: Home / Self Care

## 2019-12-27 SURGERY — LEFT HEART CATH AND CORONARY ANGIOGRAPHY
Anesthesia: LOCAL

## 2019-12-27 NOTE — Telephone Encounter (Signed)
Spoke with pt and advised for him to keep the January appt that he has with Truitt Merle, NP so we can see how he's doing and possibly schedule a stress test.  Pt verbalized understanding and was in agreement with this plan.

## 2019-12-31 ENCOUNTER — Ambulatory Visit: Payer: 59 | Admitting: Orthopaedic Surgery

## 2019-12-31 ENCOUNTER — Telehealth: Payer: Self-pay

## 2019-12-31 MED ORDER — OXYCODONE-ACETAMINOPHEN 10-325 MG PO TABS: 1.0000 | ORAL_TABLET | Freq: Four times a day (QID) | ORAL | 0 refills | Status: DC | PRN

## 2019-12-31 MED ORDER — OXYCODONE-ACETAMINOPHEN 10-325 MG PO TABS: 1.0000 | ORAL_TABLET | Freq: Four times a day (QID) | ORAL | 0 refills | Status: AC | PRN

## 2019-12-31 NOTE — Telephone Encounter (Signed)
Prescriptions resubmitted for the correct amount. Patient called and notified.

## 2019-12-31 NOTE — Addendum Note (Signed)
Addended by: Molli Barrows on: 12/31/2019 03:20 PM   Modules accepted: Orders

## 2019-12-31 NOTE — Telephone Encounter (Signed)
Patient states quantity was wrong, should be #120, not #90. Will discuss with Dr. Andree Elk and call patient back.

## 2019-12-31 NOTE — Telephone Encounter (Signed)
Dr. Andree Elk wrote the precription wrong for his percocets. He said Dr. Andree Elk told him to call if it was wrong. He is supposed to get the script filled by 12/31.

## 2019-12-31 NOTE — Progress Notes (Deleted)
CARDIOLOGY OFFICE NOTE  Date:  12/31/2019    Cameron Cardenas Date of Birth: 20-Sep-1955 Medical Record #[id number]  PCP:  Merrilee Seashore, MD  Cardiologist:  Zack Seal chief complaint on file.   History of Present Illness: Cameron Cardenas is a 64 y.o. male who presents today for a follow up/post cath visit. Seen for Dr. Tamala Julian.   He has a history of HLD, known non-obstructive CAD, type 2 diabetes mellitus, essential hypertension, abnormal EKG with bifascicular block and longstanding history of chest pain.  Seen last month with chest pain. He has had somewhat of a chronic chest pain syndrome. He had a low risk myocardial perfusion study 11 years ago and a coronary CT angio in 2020 that demonstrated a calcium score 14 and nonobstructive atherosclerosis in all 3 vessels.  Cardiac cath was recommended however he became ill with fever/chills - cath was cancelled - he was to keep this follow up and discuss possible stress testing.    Comes in today. Here with   Past Medical History:  Diagnosis Date  . Allergic rhinitis due to pollen   . Anal fissure   . Bilateral sciatica 07/09/2018  . Chronic pain syndrome 07/09/2018  . Chronic, continuous use of opioids 07/09/2018  . DDD (degenerative disc disease), lumbar   . Essential hypertension, malignant   . Hemorrhoids   . Lumbago   . Lumbar post-laminectomy syndrome 07/09/2018  . Plantar fasciitis of left foot   . Pure hypercholesterolemia   . Spinal stenosis of lumbar region 07/09/2018  . Type II or unspecified type diabetes mellitus without mention of complication, not stated as uncontrolled    dx in 2005  . Unspecified vitamin D deficiency     Past Surgical History:  Procedure Laterality Date  . COLONOSCOPY     in [facility], MD no longer in practice, does not recall the name of facility. Does belive polyps were removed  . LUMBAR DISC SURGERY  2011   total of 3 sx on back per pt  . MAXIMUM ACCESS (MAS)POSTERIOR LUMBAR INTERBODY  FUSION (PLIF) 1 LEVEL N/A 06/10/2014   Procedure: Redo Lumbar Five-Sacral One Decompression with maximum access posterior lumbar interbody fusion;  Surgeon: Erline Levine, MD;  Location: [facility];  Service: Neurosurgery;  Laterality: N/A;  Redo Decompression with maximum access posterior lumbar interbody fusion, L5-S1     Medications: No outpatient medications have been marked as taking for the 01/14/20 encounter (Appointment) with Burtis Junes, NP.     Allergies: No Known Allergies  Social History: The patient  reports that he quit smoking about a year ago. His smoking use included cigars. He has never used smokeless tobacco. He reports previous alcohol use. He reports previous drug use. Drug: Marijuana.   Family History: The patient's ***family history includes Cataracts in his sister; Diabetes in his mother and sister; Hypertension in his father and mother; Kidney disease in his mother; Seizures in his brother.   Review of Systems: Please see the history of present illness.   All other systems are reviewed and negative.   Physical Exam: VS:  There were no vitals taken for this visit. Marland Kitchen  BMI There is no height or weight on file to calculate BMI.  Wt Readings from Last 3 Encounters:  12/23/19 206 lb (93.4 kg)  09/17/19 207 lb (93.9 kg)  08/29/19 200 lb (90.7 kg)    General: Pleasant. Well developed, well nourished and in no acute distress.  HEENT: Normal.  Neck: Supple, no JVD, carotid bruits, or masses noted.  Cardiac: ***Regular rate and rhythm. No murmurs, rubs, or gallops. No edema.  Respiratory:  Lungs are clear to auscultation bilaterally with normal work of breathing.  GI: Soft and nontender.  MS: No deformity or atrophy. Gait and ROM intact.  Skin: Warm and dry. Color is normal.  Neuro:  Strength and sensation are intact and no gross focal deficits noted.  Psych: Alert, appropriate and with normal affect.   LABORATORY DATA:  EKG:  EKG {ACTION; IS/IS  GI:[id number] ordered today.  Personally reviewed by me. This demonstrates ***.  Lab Results  Component Value Date   WBC 2.6 (L) 12/23/2019   HGB 14.9 12/23/2019   HCT 43.5 12/23/2019   PLT 97 (LL) 12/23/2019   GLUCOSE 107 (H) 12/23/2019   CHOL 136 05/20/2019   TRIG 66.0 05/20/2019   HDL 65.10 05/20/2019   LDLCALC 58 05/20/2019   ALT 59 (H) 09/12/2019   AST 30 09/12/2019   NA 143 12/23/2019   K 4.2 12/23/2019   CL 106 12/23/2019   CREATININE 0.92 12/23/2019   BUN 16 12/23/2019   CO2 28 12/23/2019   TSH 1.50 03/21/2018   INR 1.14 07/30/2009   HGBA1C 6.0 09/12/2019   MICROALBUR 1.7 05/20/2019       BNP (last 3 results) No results for input(s): BNP in the last 8760 hours.  ProBNP (last 3 results) No results for input(s): PROBNP in the last 8760 hours.   Other Studies Reviewed Today:  CORONARY CTA 2020: IMPRESSION: 1. Coronary calcium score of 14. This was 42 percentile for age and sex matched control.   2. Normal coronary origin with right dominance.   3. Minimal non-obstructive CAD. Continue with risk factor Modifications.   CARDIAC EVENT MONITOR 03/2018: Study Highlights    Underlying rhythm is normal sinus rhythm  Normal heart rate range  No significant arrhythmias. Specifically no AV block, atrial fibrillation, or VT.  Occasional PAC  Chest pain does not correlate with rhythm       ASSESSMENT & PLAN:     1. Coronary artery disease involving native coronary artery of native heart without angina pectoris   2. Essential hypertension   3. Hyperlipidemia LDL goal <70   4. Type 2 diabetes mellitus with complication, without long-term current use of insulin ([place])   5. Bifascicular bundle branch block   6. Snoring   7. Educated about COVID-19 virus infection   8. Chest pain of uncertain etiology     PLAN:     In order of problems listed above:   1. The patient has had prior ischemia and anatomic evaluation in 2010 and 2000 respectively.  Each  study was relatively low risk.  With ongoing symptoms, and a multitude of risk factors, we decided to proceed with coronary angiography.  Perhaps he has multivessel disease or distal small vessel disease that is not easily identifiable with the evaluation to this point. 2. Blood pressure control is excellent on the current therapy which includes olmesartan HCT, and low-salt diet. 3. Most recent LDL cholesterol was 66 in November 2021. 4. Hemoglobin A1c 6.11 November 2019.  Continue Glucophage, Actos, and observance of low carbohydrate diet. 5. Abnormal and unchanged from prior right bundle with left anterior hemiblock. 6. Not addressed on today's exam 7. Vaccinated and practicing social distancing. 8. Overall, the chest discomfort and location is concerning.  However, the patient has had greater than 10-year history of recurring episodes of  chest and arm discomfort.  Perhaps he has cervical disc disease.   The patient was counseled to undergo left heart catheterization, coronary angiography, and possible percutaneous coronary intervention with stent implantation. The procedural risks and benefits were discussed in detail. The risks discussed included death, stroke, myocardial infarction, life-threatening bleeding, limb ischemia, kidney injury, allergy, and possible emergency cardiac surgery. The risk of these significant complications were estimated to occur less than 1% of the time. After discussion, the patient has agreed to proceed.      Current medicines are reviewed with the patient today.  The patient does not have concerns regarding medicines other than what has been noted above.  The following changes have been made:  See above.  Labs/ tests ordered today include:   No orders of the defined types were placed in this encounter.    Disposition:   FU with *** in {gen number [id number] {Days to years:10300}.   Patient is agreeable to this plan and will call if any problems develop in  the interim.   SignedTruitt Merle, NP  12/31/2019 7:56 AM  [facility] [address] [place], [place]  [address] Phone: [phone] Fax: [phone]

## 2020-01-14 ENCOUNTER — Ambulatory Visit: Payer: 59 | Admitting: Nurse Practitioner

## 2020-02-12 ENCOUNTER — Telehealth: Payer: Self-pay | Admitting: Interventional Cardiology

## 2020-02-12 NOTE — Telephone Encounter (Signed)
Spoke with the patient regarding his chest pain. He states he is "feeling pretty good right now". Patient unable to provide vital signs at this time. Patient denies any SOB, N/V or diaphoresis at this time. Patient states he went to the eye doctor this morning and his BP "was good", he can not remember the actual numbers.   Patient states this CP has been coming and going typically when he walks around. He states he had to take Nitroglycerin once on Monday which stopped his chest discomfort. He has not had to take any Nitro since.   Reviewed ER precautions with patient, advised him to call the office back if he takes another Nitro again. He verbalized understanding. I informed patient that I would send to Dr. Tamala Julian and his nurse to advise further.

## 2020-02-12 NOTE — Telephone Encounter (Signed)
Pt c/o of Chest Pain: 1. Are you having CP right now? No early today 2. Are you experiencing any other symptoms (ex. SOB, nausea, vomiting, sweating)? Early today Nausea 3. How long have you been experiencing CP? About 3 or 4 days 4. Is your CP continuous or coming and going? Coming and going 5. Have you taken Nitroglycerin? One time

## 2020-02-12 NOTE — Telephone Encounter (Signed)
Pt previously scheduled for cath in Dec but due to illness had to cancel.  Pt had appt with Truitt Merle, NP in January but did not come.  Called pt and scheduled him to see Kathyrn Drown, NP tomorrow to discuss possibly setting up cath again.  Pt agreeable to plan.

## 2020-02-13 ENCOUNTER — Encounter: Payer: Self-pay | Admitting: Cardiology

## 2020-02-13 ENCOUNTER — Other Ambulatory Visit: Payer: Self-pay

## 2020-02-13 ENCOUNTER — Ambulatory Visit (INDEPENDENT_AMBULATORY_CARE_PROVIDER_SITE_OTHER): Payer: 59 | Admitting: Cardiology

## 2020-02-13 VITALS — BP 118/74 | HR 70 | Ht 76.0 in | Wt 216.0 lb

## 2020-02-13 DIAGNOSIS — I208 Other forms of angina pectoris: Secondary | ICD-10-CM | POA: Diagnosis not present

## 2020-02-13 DIAGNOSIS — I10 Essential (primary) hypertension: Secondary | ICD-10-CM

## 2020-02-13 DIAGNOSIS — E118 Type 2 diabetes mellitus with unspecified complications: Secondary | ICD-10-CM

## 2020-02-13 DIAGNOSIS — E785 Hyperlipidemia, unspecified: Secondary | ICD-10-CM

## 2020-02-13 DIAGNOSIS — R0789 Other chest pain: Secondary | ICD-10-CM | POA: Diagnosis not present

## 2020-02-13 DIAGNOSIS — I452 Bifascicular block: Secondary | ICD-10-CM

## 2020-02-13 MED ORDER — ISOSORBIDE MONONITRATE ER 30 MG PO TB24: 15.0000 mg | ORAL_TABLET | Freq: Every day | ORAL | 3 refills | Status: DC

## 2020-02-13 MED ORDER — FUROSEMIDE 20 MG PO TABS: 20.0000 mg | ORAL_TABLET | Freq: Every day | ORAL | 3 refills | Status: DC | PRN

## 2020-02-13 MED ORDER — METOPROLOL TARTRATE 50 MG PO TABS: 50.0000 mg | ORAL_TABLET | Freq: Once | ORAL | 0 refills | Status: DC

## 2020-02-13 NOTE — Patient Instructions (Signed)
Medication Instructions:  Your physician has recommended you make the following change in your medication:  1. START IMDUR 15 MG DAILY.  2. START LASIX 20 MG AS NEEDED   *If you need a refill on your cardiac medications before your next appointment, please call your pharmacy*   Lab Work: TODAY: BMET If you have labs (blood work) drawn today and your tests are completely normal, you will receive your results only by: Marland Kitchen MyChart Message (if you have MyChart) OR . A paper copy in the mail If you have any lab test that is abnormal or we need to change your treatment, we will call you to review the results.   Testing/Procedures: Your physician has requested that you have a cardiac catheterization. Cardiac catheterization is used to diagnose and/or treat various heart conditions. Doctors may recommend this procedure for a number of different reasons. The most common reason is to evaluate chest pain. Chest pain can be a symptom of coronary artery disease (CAD), and cardiac catheterization can show whether plaque is narrowing or blocking your heart's arteries. This procedure is also used to evaluate the valves, as well as measure the blood flow and oxygen levels in different parts of your heart. For further information please visit [email or web address]. Please follow instruction sheet, as given.  Follow-Up: At [facility], you and your health needs are our priority.  As part of our continuing mission to provide you with exceptional heart care, we have created designated Provider Care Teams.  These Care Teams include your primary Cardiologist (physician) and Advanced Practice Providers (APPs -  Physician Assistants and Nurse Practitioners) who all work together to provide you with the care you need, when you need it.  We recommend signing up for the patient portal called "MyChart".  Sign up information is provided on this After Visit Summary.  MyChart is used to connect with patients for Virtual  Visits (Telemedicine).  Patients are able to view lab/test results, encounter notes, upcoming appointments, etc.  Non-urgent messages can be sent to your provider as well.   To learn more about what you can do with MyChart, go to [email or web address].    Your next appointment:   6 week(s)  The format for your next appointment:   In Person  Provider:   You may see Sinclair Grooms, MD or one of the following Advanced Practice Providers on your designated Care Team:    Kathyrn Drown, NP    Other Instructions  Your cardiac CT will be scheduled at one of the below locations:   [facility] [address] [place], [place] [address] [phone]   Please follow these instructions carefully (unless otherwise directed):  Hold all erectile dysfunction medications at least 3 days (72 hrs) prior to test.  On the Night Before the Test: . Be sure to Drink plenty of water. . Do not consume any caffeinated/decaffeinated beverages or chocolate 12 hours prior to your test. . Do not take any antihistamines 12 hours prior to your test.  On the Day of the Test: . Drink plenty of water. Do not drink any water within one hour of the test. . Do not eat any food 4 hours prior to the test. . You may take your regular medications prior to the test.  . Take metoprolol (Lopressor) two hours prior to test. . HOLD Furosemide/Hydrochlorothiazide morning of the test. . FEMALES- please wear underwire-free bra if available    Do not give Lopressor to patients with an allergy  to lopressor or anyone with asthma or active COPD symptoms (currently taking steroids).       After the Test: . Drink plenty of water. . After receiving IV contrast, you may experience a mild flushed feeling. This is normal. . On occasion, you may experience a mild rash up to 24 hours after the test. This is not dangerous. If this occurs, you can take Benadryl 25 mg and increase your fluid intake. . If you  experience trouble breathing, this can be serious. If it is severe call 911 IMMEDIATELY. If it is mild, please call our office. . If you take any of these medications: Glipizide/Metformin, Avandament, Glucavance, please do not take 48 hours after completing test unless otherwise instructed.   Once we have confirmed authorization from your insurance company, we will call you to set up a date and time for your test. Based on how quickly your insurance processes prior authorizations requests, please allow up to 4 weeks to be contacted for scheduling your Cardiac CT appointment. Be advised that routine Cardiac CT appointments could be scheduled as many as 8 weeks after your provider has ordered it.  For non-scheduling related questions, please contact the cardiac imaging nurse navigator should you have any questions/concerns: Marchia Bond, Cardiac Imaging Nurse Navigator Burley Saver, Interim Cardiac Imaging Nurse [place] [facility] Direct Office Dial: [phone]   For scheduling needs, including cancellations and rescheduling, please call [place], [phone].

## 2020-02-13 NOTE — Progress Notes (Signed)
Cardiology Office Note   Date:  02/13/2020   ID:  Ruger, Saxer September 14, 1955, MRN [id number]  PCP:  Seward Carol, MD  Cardiologist:  Dr. Daneen Schick, MD  Chief Complaint  Patient presents with  . Follow-up    History of Present Illness: Cameron Cardenas is a 65 y.o. male who presents for the evaluation of chest pain, seen for Dr. Tamala Julian.   Mr. Flori has a hx of HLD, non-obstructive CAD, DM2, HTN and a known abnormal EKG with bifascicular block and chronic chest pain.   He was last seen by Dr. Tamala Julian 12/23/19 at which time he continued to have c/o of left sided, subclavicular pain with radiation to the left shoulder and left arm. Pain was random and last approximately 30 min in duration for the two weeks prior to last OV. He had similar symptoms in the past which led to a low risk myocardial perfusion study 11 years ago. He also had a coronary CT angio 2020 that demonstrated a calcium score 14 and nonobstructive atherosclerosis in all 3 vessels.  He had previously been referred to the ED by his PCP however the patient deferred.   Due to his ongoing symptoms, Dr. Tamala Julian felt that coronary angiography was indicated. This was scheduled for 12/27/19 however it appears that he did not show up for his pre-procedure COVID screening. Pt was contacted multiple times with no answer. Eventually he was contacted and the patient had COVID symptoms therefore the cath was cancelled. Plan is for possible cath if further symptoms.   Today he presents for follow up of his chest pain. He reports no real change in his symptoms since he was last seen by Dr. Tamala Julian. He continues to describe left sided pains that are not necessarily exertional but he will have some SOB with occurrence. He took one SL NTG with relief. He describes mild LE edema at times and is not on diuretics. He reports that previously scheduled cardiac catheterization was denied by his insurance company at which time they requested  further work-up with stress test. Unfortunately, he has undergone stress test in 2020 which was found to be normal. With ongoing symptoms and no clear etiology, would recommend cardiac CTA at this time. Patient is agreeable. Will follow closely   Past Medical History:  Diagnosis Date  . Allergic rhinitis due to pollen   . Anal fissure   . Bilateral sciatica 07/09/2018  . Chronic pain syndrome 07/09/2018  . Chronic, continuous use of opioids 07/09/2018  . DDD (degenerative disc disease), lumbar   . Essential hypertension, malignant   . Hemorrhoids   . Lumbago   . Lumbar post-laminectomy syndrome 07/09/2018  . Plantar fasciitis of left foot   . Pure hypercholesterolemia   . Spinal stenosis of lumbar region 07/09/2018  . Type II or unspecified type diabetes mellitus without mention of complication, not stated as uncontrolled    dx in 2005  . Unspecified vitamin D deficiency     Past Surgical History:  Procedure Laterality Date  . COLONOSCOPY     in [facility], MD no longer in practice, does not recall the name of facility. Does belive polyps were removed  . LUMBAR DISC SURGERY  2011   total of 3 sx on back per pt  . MAXIMUM ACCESS (MAS)POSTERIOR LUMBAR INTERBODY FUSION (PLIF) 1 LEVEL N/A 06/10/2014   Procedure: Redo Lumbar Five-Sacral One Decompression with maximum access posterior lumbar interbody fusion;  Surgeon: Erline Levine, MD;  Location: [facility];  Service: Neurosurgery;  Laterality: N/A;  Redo Decompression with maximum access posterior lumbar interbody fusion, L5-S1     Current Outpatient Medications  Medication Sig Dispense Refill  . aspirin 81 MG tablet Take 81 mg by mouth daily.    Marland Kitchen atorvastatin (LIPITOR) 80 MG tablet daily in the afternoon.    . Blood Glucose Monitoring Suppl (ONETOUCH VERIO) w/Device KIT Use to check blood sugars once daily 1 kit 0  . econazole nitrate 1 % cream Apply topically 2 (two) times daily.    . fluticasone (FLONASE) 50 MCG/ACT nasal spray USE 2  SPRAY(S) IN EACH NOSTRIL ONCE DAILY    . furosemide (LASIX) 20 MG tablet Take 1 tablet (20 mg total) by mouth daily as needed. 90 tablet 3  . glucose blood test strip 1 each by Other route as needed for other. One Touch Verio- Use to test blood sugars once daily 100 each 0  . ibuprofen (ADVIL) 600 MG tablet Take 600 mg by mouth as needed.    . isosorbide mononitrate (IMDUR) 30 MG 24 hr tablet Take 0.5 tablets (15 mg total) by mouth daily. 45 tablet 3  . ketoconazole (NIZORAL) 2 % cream as needed.    . metFORMIN (GLUCOPHAGE) 1000 MG tablet Take 1 tablet (1,000 mg total) by mouth 2 (two) times daily with a meal. 180 tablet 1  . metoprolol tartrate (LOPRESSOR) 50 MG tablet Take 1 tablet (50 mg total) by mouth once for 1 dose. TAKE 1 HOUR BEFORE TESTING 1 tablet 0  . nitroGLYCERIN (NITROSTAT) 0.4 MG SL tablet Place 1 tablet (0.4 mg total) under the tongue every 5 (five) minutes as needed for chest pain. 25 tablet 3  . olmesartan-hydrochlorothiazide (BENICAR HCT) 20-12.5 MG tablet Take 1 tablet by mouth daily. Take 1 tablet by mouth once daily.    Glory Rosebush Delica Lancets 74J MISC Use to test blood sugars once daily 100 each 0  . oxyCODONE-acetaminophen (PERCOCET) 10-325 MG tablet Take 1 tablet by mouth every 6 (six) hours as needed for pain. 120 tablet 0  . pioglitazone-metformin (ACTOPLUS MET) 15-500 MG tablet daily in the afternoon.    . sildenafil (REVATIO) 20 MG tablet Take 2-3 tablets by mouth as needed, as directed Discontinue if having headaches. 30 tablet 1  . triamcinolone cream (KENALOG) 0.5 % as needed.     No current facility-administered medications for this visit.    Allergies:   Patient has no known allergies.    Social History:  The patient  reports that he quit smoking about 13 months ago. His smoking use included cigars. He has never used smokeless tobacco. He reports previous alcohol use. He reports previous drug use. Drug: Marijuana.   Family History:  The patient's family  history includes Cataracts in his sister; Diabetes in his mother and sister; Hypertension in his father and mother; Kidney disease in his mother; Seizures in his brother.    ROS:  Please see the history of present illness.   Otherwise, review of systems are positive for none.   All other systems are reviewed and negative.    PHYSICAL EXAM: VS:  BP 118/74   Pulse 70   Ht $R'6\' 4"'Zs$  (1.93 m)   Wt 216 lb (98 kg)   SpO2 96%   BMI 26.29 kg/m  , BMI Body mass index is 26.29 kg/m.   General: Well developed, well nourished, NAD Neck: Negative for carotid bruits. No JVD Lungs:Clear to ausculation bilaterally. No wheezes, rales,  or rhonchi. Breathing is unlabored. Cardiovascular: RRR with S1 S2. No murmurs Extremities: No edema. Radial pulses 2+ bilaterally Neuro: Alert and oriented. No focal deficits. No facial asymmetry. MAE spontaneously. Psych: Responds to questions appropriately with normal affect.     EKG:  EKG is ordered today. The ekg ordered today demonstrates NSR with bifascicular block, unchanged from prior tracing   Recent Labs: 09/12/2019: ALT 59 12/23/2019: BUN 16; Creatinine, Ser 0.92; Hemoglobin 14.9; Platelets 97; Potassium 4.2; Sodium 143    Lipid Panel    Component Value Date/Time   CHOL 136 05/20/2019 1004   TRIG 66.0 05/20/2019 1004   HDL 65.10 05/20/2019 1004   CHOLHDL 2 05/20/2019 1004   VLDL 13.2 05/20/2019 1004   LDLCALC 58 05/20/2019 1004      Wt Readings from Last 3 Encounters:  02/13/20 216 lb (98 kg)  12/23/19 206 lb (93.4 kg)  09/17/19 207 lb (93.9 kg)     Other studies Reviewed: Additional studies/ records that were reviewed today include:  Review of the above records demonstrates:   CORONARY CTA 2020: IMPRESSION: 1. Coronary calcium score of 14. This was 102 percentile for age and sex matched control.  2. Normal coronary origin with right dominance.  3. Minimal non-obstructive CAD. Continue with risk factor Modifications.   CARDIAC  EVENT MONITOR 03/2018: Study Highlights   Underlying rhythm is normal sinus rhythm  Normal heart rate range  No significant arrhythmias. Specifically no AV block, atrial fibrillation, or VT.  Occasional PAC  Chest pain does not correlate with rhythm  ASSESSMENT AND PLAN:  1. History of nonobstructive CAD: -Has been seen several times in the office for chest pain with no clear etiology. Previous plan was for [facility] however patient reports insurance denied coverage therefore this was canceled. He returns today for follow-up and reports symptoms have been relatively unchanged since the last office visit. He continues to be interested in [facility] however we will follow with a cardiac CTA for now given insurance difficulties. Prior cCTA with nonobstructive CAD. We will add low-dose Imdur given chest pain responsive to SL NTG. Will give a one-time dose of metoprolol tartrate 50 mg prior to procedure. HR today in the office 70 bpm. If abnormal, will proceed with LHC. Creatinine on last lab work, 0.92 however will recheck lab work today.  2. HTN: -Stable, 118/74 -Continue current regimen  3. HLD: -Last LDL, 66 on 11/12/2019 -Continue current regimen -LDL goal <70  4. History of abnormal EKG: -Unchanged from prior tracing  5. DM2: -Continue current regimen  Current medicines are reviewed at length with the patient today.  The patient does not have concerns regarding medicines.  The following changes have been made: Add Imdur 15 mg p.o. daily, add one-time dose metoprolol tartrate 50 mg p.o. prior to procedure  Labs/ tests ordered today include: BMET, cardiac CTA  Orders Placed This Encounter  Procedures  . CT CORONARY MORPH W/CTA COR W/SCORE W/CA W/CM &/OR WO/CM  . CT CORONARY FRACTIONAL FLOW RESERVE DATA PREP  . CT CORONARY FRACTIONAL FLOW RESERVE FLUID ANALYSIS  . Basic metabolic panel  . EKG 12-Lead   Disposition:   FU with myself og Dr. Tamala Julian in 1 month  Signed, Kathyrn Drown, NP   02/13/2020 4:28 PM    [address], [place], [place]  [address] Phone: [phone]; Fax: [phone]

## 2020-02-14 LAB — BASIC METABOLIC PANEL
BUN/Creatinine Ratio: 15 (ref 10–24)
BUN: 18 mg/dL (ref 8–27)
CO2: 24 mmol/L (ref 20–29)
Calcium: 9 mg/dL (ref 8.6–10.2)
Chloride: 107 mmol/L — ABNORMAL HIGH (ref 96–106)
Creatinine, Ser: 1.23 mg/dL (ref 0.76–1.27)
GFR calc Af Amer: 71 mL/min/{1.73_m2} (ref >59)
GFR calc non Af Amer: 61 mL/min/{1.73_m2} (ref >59)
Glucose: 97 mg/dL (ref 65–99)
Potassium: 4.2 mmol/L (ref 3.5–5.2)
Sodium: 144 mmol/L (ref 134–144)

## 2020-02-17 ENCOUNTER — Ambulatory Visit: Payer: 59 | Admitting: Interventional Cardiology

## 2020-02-19 ENCOUNTER — Encounter: Payer: Self-pay | Admitting: Podiatry

## 2020-02-19 ENCOUNTER — Ambulatory Visit (INDEPENDENT_AMBULATORY_CARE_PROVIDER_SITE_OTHER): Payer: 59 | Admitting: Podiatry

## 2020-02-19 ENCOUNTER — Ambulatory Visit (INDEPENDENT_AMBULATORY_CARE_PROVIDER_SITE_OTHER): Payer: 59

## 2020-02-19 ENCOUNTER — Other Ambulatory Visit: Payer: Self-pay

## 2020-02-19 DIAGNOSIS — M722 Plantar fascial fibromatosis: Secondary | ICD-10-CM | POA: Diagnosis not present

## 2020-02-19 DIAGNOSIS — M79671 Pain in right foot: Secondary | ICD-10-CM | POA: Diagnosis not present

## 2020-02-19 DIAGNOSIS — M79672 Pain in left foot: Secondary | ICD-10-CM | POA: Diagnosis not present

## 2020-02-19 MED ORDER — TRIAMCINOLONE ACETONIDE 10 MG/ML IJ SUSP
10.0000 mg | Freq: Once | INTRAMUSCULAR | Status: AC
  Administered 2020-02-19: 10 mg

## 2020-02-19 NOTE — Progress Notes (Signed)
Subjective:   Patient ID: Cameron Cardenas, male   DOB: 65 y.o.   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  Patient will be seen back to recheck for dispensing orthotics and will see me as needed  X-rays indicate that there is spur formation no indication stress fracture arthritis with depression of the arch

## 2020-02-19 NOTE — Patient Instructions (Signed)

## 2020-02-20 ENCOUNTER — Telehealth: Payer: Self-pay | Admitting: Podiatry

## 2020-02-20 NOTE — Telephone Encounter (Signed)
Liliane Channel called pt because he received a foam impression box that did not have a abn attached from yesterday. Pt was not aware they would not be covered and per Liliane Channel did not want to proceed with the orthotics

## 2020-02-24 ENCOUNTER — Ambulatory Visit: Payer: 59 | Attending: Anesthesiology | Admitting: Anesthesiology

## 2020-02-24 ENCOUNTER — Encounter: Payer: Self-pay | Admitting: Anesthesiology

## 2020-02-24 ENCOUNTER — Other Ambulatory Visit: Payer: Self-pay

## 2020-02-24 DIAGNOSIS — G894 Chronic pain syndrome: Secondary | ICD-10-CM

## 2020-02-24 DIAGNOSIS — F1190 Opioid use, unspecified, uncomplicated: Secondary | ICD-10-CM | POA: Diagnosis not present

## 2020-02-24 DIAGNOSIS — M51369 Other intervertebral disc degeneration, lumbar region without mention of lumbar back pain or lower extremity pain: Secondary | ICD-10-CM

## 2020-02-24 DIAGNOSIS — M5431 Sciatica, right side: Secondary | ICD-10-CM | POA: Diagnosis not present

## 2020-02-24 DIAGNOSIS — M48062 Spinal stenosis, lumbar region with neurogenic claudication: Secondary | ICD-10-CM | POA: Diagnosis not present

## 2020-02-24 DIAGNOSIS — M5432 Sciatica, left side: Secondary | ICD-10-CM

## 2020-02-24 DIAGNOSIS — M5136 Other intervertebral disc degeneration, lumbar region: Secondary | ICD-10-CM

## 2020-02-24 DIAGNOSIS — M961 Postlaminectomy syndrome, not elsewhere classified: Secondary | ICD-10-CM

## 2020-02-24 MED ORDER — OXYCODONE-ACETAMINOPHEN 10-325 MG PO TABS: 1.0000 | ORAL_TABLET | Freq: Four times a day (QID) | ORAL | 0 refills | Status: DC | PRN

## 2020-02-24 MED ORDER — OXYCODONE-ACETAMINOPHEN 10-325 MG PO TABS: 1.0000 | ORAL_TABLET | Freq: Four times a day (QID) | ORAL | 0 refills | Status: AC | PRN

## 2020-02-24 NOTE — Progress Notes (Signed)
Virtual Visit via Video Note  I connected with Cameron Cardenas on 02/24/20 at  1:40 PM EST by a video enabled telemedicine application and verified that I am speaking with the correct person using two identifiers.  Location: Patient: Home Provider: Pain control center   I discussed the limitations of evaluation and management by telemedicine and the availability of in person appointments. The patient expressed understanding and agreed to proceed.  History of Present Illness:   I spoke with Cameron Cardenas today via video for his virtual conference appointment.  He states that he is having an increased amount of lower extremity sciatica-like symptoms.  Has had these in the past and has had previous epidurals with his preceding practitioner.  He states that the medications continue to give him good relief but he still having breakthrough pain primarily sciatica-like symptoms.  In the past has had success with the epidural injections for these.  No other changes in lower extremity strength or function or bowel or bladder function are noted at this time the quality characteristic distribution of the pain have been stable.  He is taking his medications as prescribed with no illicit or diverting use or other problems and these are working well for him. Observations/Objective:   Current Outpatient Medications:  .  [START ON 04/02/2020] oxyCODONE-acetaminophen (PERCOCET) 10-325 MG tablet, Take 1 tablet by mouth every 6 (six) hours as needed for pain., Disp: 120 tablet, Rfl: 0 .  aspirin 81 MG tablet, Take 81 mg by mouth daily., Disp: , Rfl:  .  atorvastatin (LIPITOR) 80 MG tablet, daily in the afternoon., Disp: , Rfl:  .  Blood Glucose Monitoring Suppl (ONETOUCH VERIO) w/Device KIT, Use to check blood sugars once daily, Disp: 1 kit, Rfl: 0 .  econazole nitrate 1 % cream, Apply topically 2 (two) times daily., Disp: , Rfl:  .  fluticasone (FLONASE) 50 MCG/ACT nasal spray, USE 2 SPRAY(S) IN EACH NOSTRIL ONCE DAILY,  Disp: , Rfl:  .  furosemide (LASIX) 20 MG tablet, Take 1 tablet (20 mg total) by mouth daily as needed., Disp: 90 tablet, Rfl: 3 .  glucose blood test strip, 1 each by Other route as needed for other. One Touch Verio- Use to test blood sugars once daily, Disp: 100 each, Rfl: 0 .  ibuprofen (ADVIL) 600 MG tablet, Take 600 mg by mouth as needed., Disp: , Rfl:  .  isosorbide mononitrate (IMDUR) 30 MG 24 hr tablet, Take 0.5 tablets (15 mg total) by mouth daily., Disp: 45 tablet, Rfl: 3 .  ketoconazole (NIZORAL) 2 % cream, as needed., Disp: , Rfl:  .  metFORMIN (GLUCOPHAGE) 1000 MG tablet, Take 1 tablet (1,000 mg total) by mouth 2 (two) times daily with a meal., Disp: 180 tablet, Rfl: 1 .  metoprolol tartrate (LOPRESSOR) 50 MG tablet, Take 1 tablet (50 mg total) by mouth once for 1 dose. TAKE 1 HOUR BEFORE TESTING, Disp: 1 tablet, Rfl: 0 .  nitroGLYCERIN (NITROSTAT) 0.4 MG SL tablet, Place 1 tablet (0.4 mg total) under the tongue every 5 (five) minutes as needed for chest pain., Disp: 25 tablet, Rfl: 3 .  olmesartan-hydrochlorothiazide (BENICAR HCT) 20-12.5 MG tablet, Take 1 tablet by mouth daily. Take 1 tablet by mouth once daily., Disp: , Rfl:  .  OneTouch Delica Lancets 35T MISC, Use to test blood sugars once daily, Disp: 100 each, Rfl: 0 .  [START ON 03/03/2020] oxyCODONE-acetaminophen (PERCOCET) 10-325 MG tablet, Take 1 tablet by mouth every 6 (six) hours as needed for pain.,  Disp: 120 tablet, Rfl: 0 .  pioglitazone-metformin (ACTOPLUS MET) 15-500 MG tablet, daily in the afternoon., Disp: , Rfl:  .  sildenafil (REVATIO) 20 MG tablet, Take 2-3 tablets by mouth as needed, as directed Discontinue if having headaches., Disp: 30 tablet, Rfl: 1 .  triamcinolone cream (KENALOG) 0.5 %, as needed., Disp: , Rfl:  Assessment and Plan:  1. Spinal stenosis of lumbar region with neurogenic claudication   2. Chronic, continuous use of opioids   3. Chronic pain syndrome   4. Bilateral sciatica   5. Lumbar  post-laminectomy syndrome   6. DDD (degenerative disc disease), lumbar   7. Failed back syndrome of lumbar spine   Based on our discussion today and upon review of the [place] practitioner database information going to refill his medications for February 20 12 March 2022 with return to clinic in 2 months with schedule for epidural caudal procedure at that time.  We talked about the risks and benefits of the procedure today with him in detail and I have answered his questions as best I can.  I want him to continue stretching strengthening exercises and efforts at weight loss as reviewed with return to clinic as mentioned and continue follow-up with his primary care physicians for his baseline medical care. Follow Up Instructions:    I discussed the assessment and treatment plan with the patient. The patient was provided an opportunity to ask questions and all were answered. The patient agreed with the plan and demonstrated an understanding of the instructions.   The patient was advised to call back or seek an in-person evaluation if the symptoms worsen or if the condition fails to improve as anticipated.  I provided 30 minutes of non-face-to-face time during this encounter.   Molli Barrows, MD

## 2020-02-26 ENCOUNTER — Ambulatory Visit (HOSPITAL_COMMUNITY): Admission: RE | Admit: 2020-02-26 | Payer: 59 | Source: Ambulatory Visit

## 2020-02-27 ENCOUNTER — Other Ambulatory Visit: Payer: Self-pay | Admitting: Endocrinology

## 2020-03-17 ENCOUNTER — Other Ambulatory Visit (INDEPENDENT_AMBULATORY_CARE_PROVIDER_SITE_OTHER): Payer: 59

## 2020-03-17 ENCOUNTER — Other Ambulatory Visit: Payer: Self-pay

## 2020-03-17 DIAGNOSIS — E119 Type 2 diabetes mellitus without complications: Secondary | ICD-10-CM | POA: Diagnosis not present

## 2020-03-17 DIAGNOSIS — E785 Hyperlipidemia, unspecified: Secondary | ICD-10-CM | POA: Diagnosis not present

## 2020-03-17 LAB — COMPREHENSIVE METABOLIC PANEL
ALT: 37 U/L (ref 0–53)
AST: 23 U/L (ref 0–37)
Albumin: 4.1 g/dL (ref 3.5–5.2)
Alkaline Phosphatase: 43 U/L (ref 39–117)
BUN: 14 mg/dL (ref 6–23)
CO2: 30 mEq/L (ref 19–32)
Calcium: 9 mg/dL (ref 8.4–10.5)
Chloride: 106 mEq/L (ref 96–112)
Creatinine, Ser: 1.17 mg/dL (ref 0.40–1.50)
GFR: 65.35 mL/min (ref >60.00)
Glucose, Bld: 102 mg/dL — ABNORMAL HIGH (ref 70–99)
Potassium: 4.3 mEq/L (ref 3.5–5.1)
Sodium: 142 mEq/L (ref 135–145)
Total Bilirubin: 1 mg/dL (ref 0.2–1.2)
Total Protein: 6.4 g/dL (ref 6.0–8.3)

## 2020-03-17 LAB — MICROALBUMIN / CREATININE URINE RATIO
Creatinine,U: 124.4 mg/dL
Microalb Creat Ratio: 0.6 mg/g (ref 0.0–30.0)
Microalb, Ur: <0.7 mg/dL (ref 0.0–1.9)

## 2020-03-17 LAB — LIPID PANEL
Cholesterol: 169 mg/dL (ref 0–200)
HDL: 90.1 mg/dL (ref >39.00)
LDL Cholesterol: 67 mg/dL (ref 0–99)
NonHDL: 79.06
Total CHOL/HDL Ratio: 2
Triglycerides: 59 mg/dL (ref 0.0–149.0)
VLDL: 11.8 mg/dL (ref 0.0–40.0)

## 2020-03-17 LAB — HEMOGLOBIN A1C: Hgb A1c MFr Bld: 6.1 % (ref 4.6–6.5)

## 2020-03-19 NOTE — Progress Notes (Signed)
Patient ID: Cameron Cardenas, male   DOB: Mar 30, 1955, 65 y.o.   MRN: [id number]   Reason for Appointment: Endocrinology follow-up   I connected with the above-named patient by video enabled telemedicine application and verified that I am speaking with the correct person. The patient was explained the limitations of evaluation and management by telemedicine and the availability of in person appointments.  Patient also understood that there may be a patient responsible charge related to this service . Location of the patient: Patient's home . Location of the provider: Physician office Only the patient and myself were participating in the encounter The patient understood the above statements and agreed to proceed.   History of Present Illness   Diagnosis: Type 2 DIABETES MELITUS, date of diagnosis: 2004  PAST history: He had mild diabetes at onset and was treated with metformin and subsequently Actoplusmet In 2013 he had changed his diet significantly and started exercising. This helped him with weight loss Subsequently his blood sugars have been excellent with A1c upper normal  RECENT history:   Oral hypoglycemic drugs: Actos 30 mg daily, Metformin 1 g twice daily  His A1c is about the same at 6.1   Current management, problems and blood sugar patterns:  His blood sugars are at home appear to be higher than before although not clear if his meter is accurate since lab glucose was 111 and his fasting readings are in the 130s consistently  His meter is relatively old and he is going to start the freestyle libre now  Although he thinks he is not gaining any weight his weight appears to be higher from last month's measurement  He was switched to METFORMIN last year because of insurance denial of the Actoplusmet which he had been taking for a few years  However now he says that he is having hard time diarrhea with the Metformin dose and occasionally headaches also  He is  trying to do some walking more recently  Has only a couple of readings before lunch and dinner and these are about 135-140  Not checking after meals             Side effects from medications: None  Monitors blood glucose every other day: Using meter: One Touch  Blood sugars from patient review  PRE-MEAL Fasting Lunch Dinner Bedtime Overall  Glucose range:  110-118  113, 125  113 ?   Mean/median:     ?    Dietician visit: Most recent: 12/13               Wt Readings from Last 3 Encounters:  02/13/20 216 lb (98 kg)  12/23/19 206 lb (93.4 kg)  09/17/19 207 lb (93.9 kg)   Lab Results  Component Value Date   HGBA1C 6.1 03/17/2020   HGBA1C 6.0 09/12/2019   HGBA1C 6.1 05/20/2019   Lab Results  Component Value Date   MICROALBUR <0.7 03/17/2020   [place] 67 03/17/2020   CREATININE 1.17 03/17/2020    Lab on 03/17/2020  Component Date Value Ref Range Status  . Microalb, Ur 03/17/2020 <0.7  0.0 - 1.9 mg/dL Final  . Creatinine,U 03/17/2020 124.4  mg/dL Final  . Microalb Creat Ratio 03/17/2020 0.6  0.0 - 30.0 mg/g Final  . Cholesterol 03/17/2020 169  0 - 200 mg/dL Final   ATP III Classification       Desirable:  < 200 mg/dL  Borderline High:  200 - 239 mg/dL          High:  > = 240 mg/dL  . Triglycerides 03/17/2020 59.0  0.0 - 149.0 mg/dL Final   Normal:  <150 mg/dLBorderline High:  150 - 199 mg/dL  . HDL 03/17/2020 90.10  >39.00 mg/dL Final  . VLDL 03/17/2020 11.8  0.0 - 40.0 mg/dL Final  . LDL Cholesterol 03/17/2020 67  0 - 99 mg/dL Final  . Total CHOL/HDL Ratio 03/17/2020 2   Final                  Men          Women1/2 Average Risk     3.4          3.3Average Risk          5.0          4.42X Average Risk          9.6          7.13X Average Risk          15.0          11.0                      . NonHDL 03/17/2020 79.06   Final   NOTE:  Non-HDL goal should be 30 mg/dL higher than patient's LDL goal (i.e. LDL goal of < 70 mg/dL, would have non-HDL goal of < 100  mg/dL)  . Sodium 03/17/2020 142  135 - 145 mEq/L Final  . Potassium 03/17/2020 4.3  3.5 - 5.1 mEq/L Final  . Chloride 03/17/2020 106  96 - 112 mEq/L Final  . CO2 03/17/2020 30  19 - 32 mEq/L Final  . Glucose, Bld 03/17/2020 102* 70 - 99 mg/dL Final  . BUN 03/17/2020 14  6 - 23 mg/dL Final  . Creatinine, Ser 03/17/2020 1.17  0.40 - 1.50 mg/dL Final  . Total Bilirubin 03/17/2020 1.0  0.2 - 1.2 mg/dL Final  . Alkaline Phosphatase 03/17/2020 43  39 - 117 U/L Final  . AST 03/17/2020 23  0 - 37 U/L Final  . ALT 03/17/2020 37  0 - 53 U/L Final  . Total Protein 03/17/2020 6.4  6.0 - 8.3 g/dL Final  . Albumin 03/17/2020 4.1  3.5 - 5.2 g/dL Final  . GFR 03/17/2020 65.35  >60.00 mL/min Final   Calculated using the CKD-EPI Creatinine Equation (2021)  . Calcium 03/17/2020 9.0  8.4 - 10.5 mg/dL Final  . Hgb A1c MFr Bld 03/17/2020 6.1  4.6 - 6.5 % Final   Glycemic Control Guidelines for People with Diabetes:Non Diabetic:  <6%Goal of Therapy: <7%Additional Action Suggested:  >8%      Allergies as of 03/20/2020   No Known Allergies     Medication List       Accurate as of March 19, 2020  9:02 PM. If you have any questions, ask your nurse or doctor.        aspirin 81 MG tablet Take 81 mg by mouth daily.   atorvastatin 80 MG tablet Commonly known as: LIPITOR daily in the afternoon.   econazole nitrate 1 % cream Apply topically 2 (two) times daily.   ezetimibe 10 MG tablet Commonly known as: ZETIA Take 1 tablet by mouth once daily   fluticasone 50 MCG/ACT nasal spray Commonly known as: FLONASE USE 2 SPRAY(S) IN EACH NOSTRIL ONCE DAILY   furosemide 20 MG tablet Commonly known as: LASIX  Take 1 tablet (20 mg total) by mouth daily as needed.   glucose blood test strip 1 each by Other route as needed for other. One Touch Verio- Use to test blood sugars once daily   ibuprofen 600 MG tablet Commonly known as: ADVIL Take 600 mg by mouth as needed.   isosorbide mononitrate 30 MG 24 hr  tablet Commonly known as: IMDUR Take 0.5 tablets (15 mg total) by mouth daily.   ketoconazole 2 % cream Commonly known as: NIZORAL as needed.   metFORMIN 1000 MG tablet Commonly known as: GLUCOPHAGE Take 1 tablet (1,000 mg total) by mouth 2 (two) times daily with a meal.   metoprolol tartrate 50 MG tablet Commonly known as: LOPRESSOR Take 1 tablet (50 mg total) by mouth once for 1 dose. TAKE 1 HOUR BEFORE TESTING   nitroGLYCERIN 0.4 MG SL tablet Commonly known as: NITROSTAT Place 1 tablet (0.4 mg total) under the tongue every 5 (five) minutes as needed for chest pain.   olmesartan-hydrochlorothiazide 20-12.5 MG tablet Commonly known as: BENICAR HCT Take 1 tablet by mouth daily. Take 1 tablet by mouth once daily.   OneTouch Delica Lancets 73U Misc Use to test blood sugars once daily   OneTouch Verio w/Device Kit Use to check blood sugars once daily   oxyCODONE-acetaminophen 10-325 MG tablet Commonly known as: Percocet Take 1 tablet by mouth every 6 (six) hours as needed for pain.   oxyCODONE-acetaminophen 10-325 MG tablet Commonly known as: Percocet Take 1 tablet by mouth every 6 (six) hours as needed for pain. Start taking on: April 02, 2020   pioglitazone-metformin 15-500 MG tablet Commonly known as: ACTOPLUS MET daily in the afternoon.   sildenafil 20 MG tablet Commonly known as: REVATIO Take 2-3 tablets by mouth as needed, as directed Discontinue if having headaches.   triamcinolone cream 0.5 % Commonly known as: KENALOG as needed.       Allergies:  No Known Allergies  Past Medical History:  Diagnosis Date  . Allergic rhinitis due to pollen   . Anal fissure   . Bilateral sciatica 07/09/2018  . Chronic pain syndrome 07/09/2018  . Chronic, continuous use of opioids 07/09/2018  . DDD (degenerative disc disease), lumbar   . Essential hypertension, malignant   . Hemorrhoids   . Lumbago   . Lumbar post-laminectomy syndrome 07/09/2018  . Plantar fasciitis  of left foot   . Pure hypercholesterolemia   . Spinal stenosis of lumbar region 07/09/2018  . Type II or unspecified type diabetes mellitus without mention of complication, not stated as uncontrolled    dx in 2005  . Unspecified vitamin D deficiency     Past Surgical History:  Procedure Laterality Date  . COLONOSCOPY     in [facility], MD no longer in practice, does not recall the name of facility. Does belive polyps were removed  . LUMBAR DISC SURGERY  2011   total of 3 sx on back per pt  . MAXIMUM ACCESS (MAS)POSTERIOR LUMBAR INTERBODY FUSION (PLIF) 1 LEVEL N/A 06/10/2014   Procedure: Redo Lumbar Five-Sacral One Decompression with maximum access posterior lumbar interbody fusion;  Surgeon: Erline Levine, MD;  Location: [place];  Service: Neurosurgery;  Laterality: N/A;  Redo Decompression with maximum access posterior lumbar interbody fusion, L5-S1    Family History  Problem Relation Age of Onset  . Hypertension Mother   . Kidney disease Mother   . Diabetes Mother   . Hypertension Father   . Diabetes Sister   .  Cataracts Sister   . Seizures Brother        caused his death  . Colon cancer Neg Hx   . Esophageal cancer Neg Hx   . Rectal cancer Neg Hx   . Stomach cancer Neg Hx   . Heart disease Neg Hx   . Colon polyps Neg Hx     Social History:  reports that he quit smoking about 14 months ago. His smoking use included cigars. He has never used smokeless tobacco. He reports previous alcohol use. He reports previous drug use. Drug: Marijuana.   Lab on 03/17/2020  Component Date Value Ref Range Status  . Microalb, Ur 03/17/2020 <0.7  0.0 - 1.9 mg/dL Final  . Creatinine,U 52/95/5397 124.4  mg/dL Final  . Microalb Creat Ratio 03/17/2020 0.6  0.0 - 30.0 mg/g Final  . Cholesterol 03/17/2020 169  0 - 200 mg/dL Final   ATP III Classification       Desirable:  < 200 mg/dL               Borderline High:  200 - 239 mg/dL          High:  > = 141 mg/dL  . Triglycerides 03/17/2020 59.0  0.0 -  149.0 mg/dL Final   Normal:  <067 mg/dLBorderline High:  150 - 199 mg/dL  . HDL 03/17/2020 90.10  >39.00 mg/dL Final  . VLDL 76/16/0760 11.8  0.0 - 40.0 mg/dL Final  . LDL Cholesterol 03/17/2020 67  0 - 99 mg/dL Final  . Total CHOL/HDL Ratio 03/17/2020 2   Final                  Men          Women1/2 Average Risk     3.4          3.3Average Risk          5.0          4.42X Average Risk          9.6          7.13X Average Risk          15.0          11.0                      . NonHDL 03/17/2020 79.06   Final   NOTE:  Non-HDL goal should be 30 mg/dL higher than patient's LDL goal (i.e. LDL goal of < 70 mg/dL, would have non-HDL goal of < 100 mg/dL)  . Sodium 03/17/2020 142  135 - 145 mEq/L Final  . Potassium 03/17/2020 4.3  3.5 - 5.1 mEq/L Final  . Chloride 03/17/2020 106  96 - 112 mEq/L Final  . CO2 03/17/2020 30  19 - 32 mEq/L Final  . Glucose, Bld 03/17/2020 102* 70 - 99 mg/dL Final  . BUN 66/78/5547 14  6 - 23 mg/dL Final  . Creatinine, Ser 03/17/2020 1.17  0.40 - 1.50 mg/dL Final  . Total Bilirubin 03/17/2020 1.0  0.2 - 1.2 mg/dL Final  . Alkaline Phosphatase 03/17/2020 43  39 - 117 U/L Final  . AST 03/17/2020 23  0 - 37 U/L Final  . ALT 03/17/2020 37  0 - 53 U/L Final  . Total Protein 03/17/2020 6.4  6.0 - 8.3 g/dL Final  . Albumin 68/91/5525 4.1  3.5 - 5.2 g/dL Final  . GFR 36/48/3893 65.35  >60.00 mL/min Final   Calculated using the  CKD-EPI Creatinine Equation (2021)  . Calcium 03/17/2020 9.0  8.4 - 10.5 mg/dL Final  . Hgb A1c MFr Bld 03/17/2020 6.1  4.6 - 6.5 % Final   Glycemic Control Guidelines for People with Diabetes:Non Diabetic:  <6%Goal of Therapy: <7%Additional Action Suggested:  >8%     Review of Systems:  HYPERTENSION:   Treated with Benicar HCT, followed by PCP  Not monitoring at home  BP Readings from Last 3 Encounters:  02/13/20 118/74  12/23/19 122/80  08/29/19 135/85     HYPERLIPIDEMIA: The lipid abnormality consists of elevated LDL  He had been on 40  mg atorvastatin since 12/13 and this was increased to 80 mg in 6/16 Previously did have LDL particle number of 1348 previously and which was relatively high at 1216 in 6/16  With Crestor 20 mg and Zetia daily his LDL is excellent  Labs as follows:    Lab Results  Component Value Date   CHOL 169 03/17/2020   CHOL 136 05/20/2019   CHOL 155 10/30/2018   Lab Results  Component Value Date   HDL 90.10 03/17/2020   HDL 65.10 05/20/2019   HDL 69.40 10/30/2018   Lab Results  Component Value Date   LDLCALC 67 03/17/2020   LDLCALC 58 05/20/2019   LDLCALC 72 10/30/2018   Lab Results  Component Value Date   TRIG 59.0 03/17/2020   TRIG 66.0 05/20/2019   TRIG 64.0 10/30/2018   Lab Results  Component Value Date   CHOLHDL 2 03/17/2020   CHOLHDL 2 05/20/2019   CHOLHDL 2 10/30/2018   No results found for: LDLDIRECT  Lab Results  Component Value Date   CHOL 169 03/17/2020   HDL 90.10 03/17/2020   LDLCALC 67 03/17/2020   TRIG 59.0 03/17/2020   CHOLHDL 2 03/17/2020    He is asking about his left foot swelling for the last 2 weeks    He had a foot exam in 5/20    Examination:   There were no vitals taken for this visit.  There is no height or weight on file to calculate BMI.      ASSESSMENT/ PLAN:   Diabetes type 2, last BMI 24  He has had mild diabetes with consistently good A1c levels in the normal range  He has been on Actos 30 mg and Metformin 1 g twice daily   A1c is stable at 6.1 Not clear if his home meter is accurate since his fasting readings are about 130+ but only 102 in the lab fasting  He is not checking readings after meals much Recently has started doing some walking for exercise  Recommendations: Will reduce his Metformin to 500 mg twice daily and changing to extended release since he is likely having diarrhea from this Since he is planning to use the freestyle libre discussed need to check blood sugars consistently after meals also  Continue  same regimen for hyperlipidemia  He needs to follow-up with his PCP regarding variable blood pressure readings Also needs foot exam in the office on the next visit  He will discuss his left leg and foot swelling with the PCP; since he has been on Actos for several years without any edema unlikely that this is causing it  Urine microalbumin normal  Follow-up in 6 months   There are no Patient Instructions on file for this visit.  Elayne Snare 03/19/2020, 9:02 PM     Note: This office note was prepared with Dragon voice recognition system technology. Any transcriptional  errors that result from this process are unintentional.

## 2020-03-20 ENCOUNTER — Telehealth (INDEPENDENT_AMBULATORY_CARE_PROVIDER_SITE_OTHER): Payer: 59 | Admitting: Endocrinology

## 2020-03-20 ENCOUNTER — Other Ambulatory Visit: Payer: Self-pay

## 2020-03-20 ENCOUNTER — Ambulatory Visit: Payer: 59 | Admitting: Podiatry

## 2020-03-20 ENCOUNTER — Encounter: Payer: Self-pay | Admitting: Endocrinology

## 2020-03-20 VITALS — Ht 76.0 in | Wt 208.0 lb

## 2020-03-20 DIAGNOSIS — E785 Hyperlipidemia, unspecified: Secondary | ICD-10-CM | POA: Diagnosis not present

## 2020-03-20 DIAGNOSIS — E119 Type 2 diabetes mellitus without complications: Secondary | ICD-10-CM

## 2020-03-20 MED ORDER — PIOGLITAZONE HCL 30 MG PO TABS: 30.0000 mg | ORAL_TABLET | Freq: Every day | ORAL | 1 refills | Status: DC

## 2020-03-20 MED ORDER — METFORMIN HCL ER 500 MG PO TB24: 1000.0000 mg | ORAL_TABLET | Freq: Every day | ORAL | 1 refills | Status: DC

## 2020-03-25 ENCOUNTER — Telehealth: Payer: Self-pay | Admitting: Anesthesiology

## 2020-03-25 NOTE — Telephone Encounter (Signed)
Patient called to get scheduled for procedure w/sedation. He needs instructions about diabetic meds and others. Please call patient

## 2020-03-26 NOTE — Telephone Encounter (Signed)
Called patient to answer questions re; procedure on 04/20/20, caudle epidural.  Sedation instructions given, not to eat 6 hours prior to procedure and to bring a driver.  His metformin is taken with supper, instructed to go ahead and take that evening before. He will take his BP medicine and hold other medications until after procedure.  Questions answered re; procedure and steps that we follow.  Patient verbalizes u/o all information given.  Told him that if he has additional questions to please call.

## 2020-04-03 ENCOUNTER — Ambulatory Visit: Payer: 59 | Admitting: Podiatry

## 2020-04-09 ENCOUNTER — Telehealth: Payer: Self-pay | Admitting: Endocrinology

## 2020-04-09 ENCOUNTER — Ambulatory Visit (INDEPENDENT_AMBULATORY_CARE_PROVIDER_SITE_OTHER): Payer: 59 | Admitting: Podiatry

## 2020-04-09 ENCOUNTER — Other Ambulatory Visit: Payer: Self-pay

## 2020-04-09 DIAGNOSIS — M722 Plantar fascial fibromatosis: Secondary | ICD-10-CM

## 2020-04-09 DIAGNOSIS — E1140 Type 2 diabetes mellitus with diabetic neuropathy, unspecified: Secondary | ICD-10-CM

## 2020-04-09 DIAGNOSIS — M2041 Other hammer toe(s) (acquired), right foot: Secondary | ICD-10-CM

## 2020-04-09 DIAGNOSIS — M2042 Other hammer toe(s) (acquired), left foot: Secondary | ICD-10-CM | POA: Diagnosis not present

## 2020-04-09 DIAGNOSIS — E1149 Type 2 diabetes mellitus with other diabetic neurological complication: Secondary | ICD-10-CM

## 2020-04-09 NOTE — Progress Notes (Deleted)
Cardiology Office Note:    Date:  04/09/2020   ID:  Cameron Cardenas, DOB [birth year], MRN [id number]  PCP:  Seward Carol, MD  Cardiologist:  Sinclair Grooms, MD   Referring MD: Seward Carol, MD   No chief complaint on file.   History of Present Illness:    Cameron Cardenas is a 65 y.o. male with a hx of HLD, non-obstructive CAD, DM2, HTN and a known abnormal EKG with bifascicular block and chronic chest pain.    ***  Past Medical History:  Diagnosis Date  . Allergic rhinitis due to pollen   . Anal fissure   . Bilateral sciatica 07/09/2018  . Chronic pain syndrome 07/09/2018  . Chronic, continuous use of opioids 07/09/2018  . DDD (degenerative disc disease), lumbar   . Essential hypertension, malignant   . Hemorrhoids   . Lumbago   . Lumbar post-laminectomy syndrome 07/09/2018  . Plantar fasciitis of left foot   . Pure hypercholesterolemia   . Spinal stenosis of lumbar region 07/09/2018  . Type II or unspecified type diabetes mellitus without mention of complication, not stated as uncontrolled    dx in 2005  . Unspecified vitamin D deficiency     Past Surgical History:  Procedure Laterality Date  . COLONOSCOPY     in [facility], MD no longer in practice, does not recall the name of facility. Does belive polyps were removed  . LUMBAR DISC SURGERY  2011   total of 3 sx on back per pt  . MAXIMUM ACCESS (MAS)POSTERIOR LUMBAR INTERBODY FUSION (PLIF) 1 LEVEL N/A 06/10/2014   Procedure: Redo Lumbar Five-Sacral One Decompression with maximum access posterior lumbar interbody fusion;  Surgeon: Erline Levine, MD;  Location: [place] [facility];  Service: Neurosurgery;  Laterality: N/A;  Redo Decompression with maximum access posterior lumbar interbody fusion, L5-S1    Current Medications: No outpatient medications have been marked as taking for the 04/10/20 encounter (Appointment) with Belva Crome, MD.     Allergies:   Patient has no known allergies.   Social History   Socioeconomic  History  . Marital status: Married    Spouse name: Not on file  . Number of children: 1  . Years of education: 34  . Highest education level: High school graduate  Occupational History  . Occupation: disabled  Tobacco Use  . Smoking status: Former Smoker    Types: Cigars    Quit date: 01/11/2019    Years since quitting: 1.2  . Smokeless tobacco: Never Used  Vaping Use  . Vaping Use: Never used  Substance and Sexual Activity  . Alcohol use: Not Currently    Comment: 6 pack beer lasts a month  . Drug use: Not Currently    Types: Marijuana  . Sexual activity: Not on file  Other Topics Concern  . Not on file  Social History Narrative   Lives with wife in a one story home.  Has one child.     He is on disability since January 2020, stopped working in February 2019 for low back pain.  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     Education: high school.     Social Determinants of Health   Financial Resource Strain: Not on file  Food Insecurity: Not on file  Transportation Needs: Not on file  Physical Activity: Not on file  Stress: Not on file  Social Connections: Not on file     Family History: The patient's family history includes Cataracts in his sister; Diabetes  in his mother and sister; Hypertension in his father and mother; Kidney disease in his mother; Seizures in his brother. There is no history of Colon cancer, Esophageal cancer, Rectal cancer, Stomach cancer, Heart disease, or Colon polyps.  ROS:   Please see the history of present illness.    *** All other systems reviewed and are negative.  EKGs/Labs/Other Studies Reviewed:    The following studies were reviewed today:  COR CTA 06/2018: IMPRESSION: 1. Coronary calcium score of 14. This was 76 percentile for age and sex matched control.  2. Normal coronary origin with right dominance.  3. Minimal non-obstructive CAD. Continue with risk factor modifications.   EKG:  EKG ***  Recent Labs: 12/23/2019: Hemoglobin 14.9;  Platelets 97 03/17/2020: ALT 37; BUN 14; Creatinine, Ser 1.17; Potassium 4.3; Sodium 142  Recent Lipid Panel    Component Value Date/Time   CHOL 169 03/17/2020 0733   TRIG 59.0 03/17/2020 0733   HDL 90.10 03/17/2020 0733   CHOLHDL 2 03/17/2020 0733   VLDL 11.8 03/17/2020 0733   LDLCALC 67 03/17/2020 0733    Physical Exam:    VS:  There were no vitals taken for this visit.    Wt Readings from Last 3 Encounters:  03/20/20 208 lb (94.3 kg)  02/13/20 216 lb (98 kg)  12/23/19 206 lb (93.4 kg)     GEN: ***. No acute distress HEENT: Normal NECK: No JVD. LYMPHATICS: No lymphadenopathy CARDIAC: *** murmur. RRR *** gallop, or edema. VASCULAR: *** Normal Pulses. No bruits. RESPIRATORY:  Clear to auscultation without rales, wheezing or rhonchi  ABDOMEN: Soft, non-tender, non-distended, No pulsatile mass, MUSCULOSKELETAL: No deformity  SKIN: Warm and dry NEUROLOGIC:  Alert and oriented x 3 PSYCHIATRIC:  Normal affect   ASSESSMENT:    1. Stable angina pectoris ([place])   2. Essential hypertension   3. Hyperlipidemia, unspecified hyperlipidemia type   4. Bifascicular bundle branch block   5. Type 2 diabetes mellitus with complication, without long-term current use of insulin ([place])   6. Coronary artery disease involving native coronary artery of native heart without angina pectoris   7. Snoring    PLAN:    In order of problems listed above:  1. ***   Medication Adjustments/Labs and Tests Ordered: Current medicines are reviewed at length with the patient today.  Concerns regarding medicines are outlined above.  No orders of the defined types were placed in this encounter.  No orders of the defined types were placed in this encounter.   There are no Patient Instructions on file for this visit.   Signed, Sinclair Grooms, MD  04/09/2020 5:57 PM    [place]

## 2020-04-09 NOTE — Telephone Encounter (Signed)
Patient called to advise that his PCP took him off Pioglitazone because his ankles were swelling.  Patient is requesting a replacement medication be sent to his pharmacy - [facility] on [address]  Call back number for patient is [phone]

## 2020-04-09 NOTE — Telephone Encounter (Signed)
I don't know this patients history to give replacement for Pioglitazone . He has two options  Contact PCP for replacement until Dr. Dwyane Dee is back  Wait until Dr. Dwyane Dee is back Schedule a follow up with me to discuss that. May offer an acute spot if needed

## 2020-04-09 NOTE — Telephone Encounter (Signed)
Please advise 

## 2020-04-09 NOTE — Progress Notes (Signed)
Subjective:   Patient ID: Cameron Cardenas, male   DOB: 65 y.o.   MRN: [id number]   HPI Patient presents concerned with continued discomfort in his heels dry skin and nail disease which is bothersome.  Has diabetes states that he does get symptoms associated with tingling-like pain   ROS      Objective:  Physical Exam  Moderate diminished neurological sensation long-term diabetes vascular status intact mild heel pain thick nail disease bilateral and digital deformities bilateral with rotation fifth toes     Assessment:  Long-term diabetic who has moderate symptoms and neuropathic symptoms associated with it with skin issues digital issues and nail issues     Plan:  H&P reviewed all conditions.  I do think he would do well with diabetic shoes we will get permission for this from his family physician or endocrinologist and I did debride the nails I educated him on daily routine of washing his feet and utilizing creams and daily inspections of his feet.

## 2020-04-10 ENCOUNTER — Encounter: Payer: Self-pay | Admitting: Anesthesiology

## 2020-04-10 ENCOUNTER — Ambulatory Visit: Payer: 59 | Attending: Anesthesiology | Admitting: Anesthesiology

## 2020-04-10 ENCOUNTER — Ambulatory Visit: Payer: 59 | Admitting: Interventional Cardiology

## 2020-04-10 ENCOUNTER — Ambulatory Visit (INDEPENDENT_AMBULATORY_CARE_PROVIDER_SITE_OTHER): Payer: 59 | Admitting: Internal Medicine

## 2020-04-10 ENCOUNTER — Encounter: Payer: Self-pay | Admitting: Internal Medicine

## 2020-04-10 VITALS — BP 140/90 | HR 82 | Ht 76.0 in | Wt 215.4 lb

## 2020-04-10 DIAGNOSIS — M5431 Sciatica, right side: Secondary | ICD-10-CM | POA: Diagnosis not present

## 2020-04-10 DIAGNOSIS — M48062 Spinal stenosis, lumbar region with neurogenic claudication: Secondary | ICD-10-CM | POA: Diagnosis not present

## 2020-04-10 DIAGNOSIS — E119 Type 2 diabetes mellitus without complications: Secondary | ICD-10-CM | POA: Diagnosis not present

## 2020-04-10 DIAGNOSIS — M5126 Other intervertebral disc displacement, lumbar region: Secondary | ICD-10-CM

## 2020-04-10 DIAGNOSIS — I208 Other forms of angina pectoris: Secondary | ICD-10-CM

## 2020-04-10 DIAGNOSIS — F1190 Opioid use, unspecified, uncomplicated: Secondary | ICD-10-CM

## 2020-04-10 DIAGNOSIS — R0683 Snoring: Secondary | ICD-10-CM

## 2020-04-10 DIAGNOSIS — I10 Essential (primary) hypertension: Secondary | ICD-10-CM

## 2020-04-10 DIAGNOSIS — G894 Chronic pain syndrome: Secondary | ICD-10-CM

## 2020-04-10 DIAGNOSIS — M5432 Sciatica, left side: Secondary | ICD-10-CM

## 2020-04-10 DIAGNOSIS — M961 Postlaminectomy syndrome, not elsewhere classified: Secondary | ICD-10-CM

## 2020-04-10 DIAGNOSIS — I2510 Atherosclerotic heart disease of native coronary artery without angina pectoris: Secondary | ICD-10-CM

## 2020-04-10 DIAGNOSIS — E785 Hyperlipidemia, unspecified: Secondary | ICD-10-CM

## 2020-04-10 DIAGNOSIS — I452 Bifascicular block: Secondary | ICD-10-CM

## 2020-04-10 DIAGNOSIS — M5136 Other intervertebral disc degeneration, lumbar region: Secondary | ICD-10-CM

## 2020-04-10 DIAGNOSIS — M51369 Other intervertebral disc degeneration, lumbar region without mention of lumbar back pain or lower extremity pain: Secondary | ICD-10-CM

## 2020-04-10 DIAGNOSIS — E118 Type 2 diabetes mellitus with unspecified complications: Secondary | ICD-10-CM

## 2020-04-10 LAB — POCT GLUCOSE (DEVICE FOR HOME USE): POC Glucose: 140 mg/dl — AB (ref 70–99)

## 2020-04-10 MED ORDER — OXYCODONE-ACETAMINOPHEN 10-325 MG PO TABS: 1.0000 | ORAL_TABLET | Freq: Four times a day (QID) | ORAL | 0 refills | Status: AC | PRN

## 2020-04-10 MED ORDER — DAPAGLIFLOZIN PROPANEDIOL 5 MG PO TABS: 5.0000 mg | ORAL_TABLET | Freq: Every day | ORAL | 6 refills | Status: DC

## 2020-04-10 MED ORDER — SILDENAFIL CITRATE 20 MG PO TABS: ORAL_TABLET | ORAL | 1 refills | Status: DC

## 2020-04-10 MED ORDER — MELOXICAM 7.5 MG PO TABS: 7.5000 mg | ORAL_TABLET | Freq: Every day | ORAL | 5 refills | Status: DC

## 2020-04-10 MED ORDER — OXYCODONE-ACETAMINOPHEN 10-325 MG PO TABS: 1.0000 | ORAL_TABLET | Freq: Four times a day (QID) | ORAL | 0 refills | Status: DC | PRN

## 2020-04-10 NOTE — Progress Notes (Signed)
Virtual Visit via Telephone Note  I connected with Cameron Cardenas on 04/10/20 at  1:20 PM EDT by telephone and verified that I am speaking with the correct person using two identifiers.  Location: Patient: Home Provider: Pain control center   I discussed the limitations, risks, security and privacy concerns of performing an evaluation and management service by telephone and the availability of in person appointments. I also discussed with the patient that there may be a patient responsible charge related to this service. The patient expressed understanding and agreed to proceed.   History of Present Illness:   I spoke with Cameron Cardenas via telephone today as he was unable to do the video portion of the virtual conference he reports that he has been having some more sciatica symptoms but wants to defer on the epidural injection secondary to advice from his primary care physician.  He has had some problems with his blood pressure recently and has switched some medications.  Once this gets stable he would like to then proceed with an epidural injection.  Otherwise he is in his usual state of health.  No other changes in lower extremity strength or function or bowel bladder function are noted at this time.  He is taking his medications as prescribed and these continue to work well for him.  He generally gets about 75% relief with the oxycodone 10 mg tablets lasting 4 to 6 hours and takes these 4 times a day without any diverting or illicit use or problems with the medication.  Unfortunately more conservative therapy has failed for him.  He is also out of his meloxicam which he was taking 7.5 mg once a day effectively without problem. Observations/Objective:   Current Outpatient Medications:  .  meloxicam (MOBIC) 7.5 MG tablet, Take 1 tablet (7.5 mg total) by mouth daily., Disp: 30 tablet, Rfl: 5 .  [START ON 05/02/2020] oxyCODONE-acetaminophen (PERCOCET) 10-325 MG tablet, Take 1 tablet by mouth every 6  (six) hours as needed for pain., Disp: 120 tablet, Rfl: 0 .  [START ON 06/01/2020] oxyCODONE-acetaminophen (PERCOCET) 10-325 MG tablet, Take 1 tablet by mouth every 6 (six) hours as needed for pain., Disp: 120 tablet, Rfl: 0 .  aspirin 81 MG tablet, Take 81 mg by mouth daily., Disp: , Rfl:  .  atorvastatin (LIPITOR) 80 MG tablet, daily in the afternoon., Disp: , Rfl:  .  Blood Glucose Monitoring Suppl (ONETOUCH VERIO) w/Device KIT, Use to check blood sugars once daily, Disp: 1 kit, Rfl: 0 .  dapagliflozin propanediol (FARXIGA) 5 MG TABS tablet, Take 1 tablet (5 mg total) by mouth daily before breakfast., Disp: 30 tablet, Rfl: 6 .  econazole nitrate 1 % cream, Apply topically 2 (two) times daily., Disp: , Rfl:  .  ezetimibe (ZETIA) 10 MG tablet, Take 1 tablet by mouth once daily, Disp: 90 tablet, Rfl: 1 .  fluticasone (FLONASE) 50 MCG/ACT nasal spray, USE 2 SPRAY(S) IN EACH NOSTRIL ONCE DAILY, Disp: , Rfl:  .  furosemide (LASIX) 20 MG tablet, Take 1 tablet (20 mg total) by mouth daily as needed., Disp: 90 tablet, Rfl: 3 .  glucose blood test strip, 1 each by Other route as needed for other. One Touch Verio- Use to test blood sugars once daily, Disp: 100 each, Rfl: 0 .  ibuprofen (ADVIL) 600 MG tablet, Take 600 mg by mouth as needed., Disp: , Rfl:  .  isosorbide mononitrate (IMDUR) 30 MG 24 hr tablet, Take 0.5 tablets (15 mg total) by mouth daily.,  Disp: 45 tablet, Rfl: 3 .  ketoconazole (NIZORAL) 2 % cream, as needed., Disp: , Rfl:  .  metFORMIN (GLUCOPHAGE-XR) 500 MG 24 hr tablet, Take 2 tablets (1,000 mg total) by mouth daily with supper., Disp: 180 tablet, Rfl: 1 .  nitroGLYCERIN (NITROSTAT) 0.4 MG SL tablet, Place 1 tablet (0.4 mg total) under the tongue every 5 (five) minutes as needed for chest pain. (Patient not taking: Reported on 03/20/2020), Disp: 25 tablet, Rfl: 3 .  olmesartan-hydrochlorothiazide (BENICAR HCT) 20-12.5 MG tablet, Take 1 tablet by mouth daily. Take 1 tablet by mouth once  daily., Disp: , Rfl:  .  OneTouch Delica Lancets 66K MISC, Use to test blood sugars once daily, Disp: 100 each, Rfl: 0 .  sildenafil (REVATIO) 20 MG tablet, Take 2-3 tablets by mouth as needed, as directed Discontinue if having headaches., Disp: 30 tablet, Rfl: 1 .  triamcinolone cream (KENALOG) 0.5 %, as needed., Disp: , Rfl:  Assessment and Plan: 1. Spinal stenosis of lumbar region with neurogenic claudication   2. Chronic, continuous use of opioids   3. Chronic pain syndrome   4. Bilateral sciatica   5. Lumbar post-laminectomy syndrome   6. DDD (degenerative disc disease), lumbar   7. Failed back syndrome of lumbar spine   8. Herniated lumbar intervertebral disc   Based on her discussion today I think is appropriate to refill his medications for the next 2 months dated for April 01 Jun 2021.  This will be for the oxycodone 10 mg 325 tablets 4 times daily.  I have reviewed the [facility] practitioner database information and it is appropriate for refills.  No other changes will be made in his pain management protocol.  I want him to continue efforts at weight loss stretching strengthening and we will defer to him as far as when to do the epidural injection.  Continue follow-up with the primary care physicians as well.  Follow Up Instructions:    I discussed the assessment and treatment plan with the patient. The patient was provided an opportunity to ask questions and all were answered. The patient agreed with the plan and demonstrated an understanding of the instructions.   The patient was advised to call back or seek an in-person evaluation if the symptoms worsen or if the condition fails to improve as anticipated.  I provided 30 minutes of non-face-to-face time during this encounter.   Molli Barrows, MD

## 2020-04-10 NOTE — Telephone Encounter (Signed)
Pt have an appt with Dr. Annetta Maw today.

## 2020-04-10 NOTE — Progress Notes (Signed)
Name: Cameron Cardenas  Age/ Sex: 65 y.o., male   MRN/ DOB: [id number], 1955/12/21     PCP: Seward Carol, MD   Reason for Endocrinology Evaluation: Type 2 Diabetes Mellitus  Initial Endocrine Consultative Visit: 8/19/20214    PATIENT IDENTIFIER: Cameron Cardenas is a 65 y.o. male with a past medical history of T2DM, HTN and Dyslipidemia . The patient has followed with Endocrinology clinic since 08/29/2019 for consultative assistance with management of his diabetes.  DIABETIC HISTORY:  [name] was diagnosed with DM in 2004, he was on Pioglitazone at some point but this was discontinued due to LE edema in 03/2020. His hemoglobin A1c has ranged from 5.8% in 2020, peaking at 6.1% in 2022.   SUBJECTIVE:   During the last visit (03/20/2020): 6.1% Reduced  metformin and Actos   Today (04/10/2020): Cameron Cardenas is here for a follow up on diabetes. His Actos has been discontinued due to LE swelling. He is here to discuss alternatives.  He checks his blood sugars 1 times daily. The patient has not had hypoglycemic episodes since the last clinic visit  LE edema have improved  Denies sob   HOME DIABETES REGIMEN:  Metformin XR 500 mg BID    METER DOWNLOAD SUMMARY: Did not bring     HISTORY:  Past Medical History:  Past Medical History:  Diagnosis Date  . Allergic rhinitis due to pollen   . Anal fissure   . Bilateral sciatica 07/09/2018  . Chronic pain syndrome 07/09/2018  . Chronic, continuous use of opioids 07/09/2018  . DDD (degenerative disc disease), lumbar   . Essential hypertension, malignant   . Hemorrhoids   . Lumbago   . Lumbar post-laminectomy syndrome 07/09/2018  . Plantar fasciitis of left foot   . Pure hypercholesterolemia   . Spinal stenosis of lumbar region 07/09/2018  . Type II or unspecified type diabetes mellitus without mention of complication, not stated as uncontrolled    dx in 2005  . Unspecified vitamin D deficiency    Past Surgical History:  Past  Surgical History:  Procedure Laterality Date  . COLONOSCOPY     in [facility], MD no longer in practice, does not recall the name of facility. Does belive polyps were removed  . LUMBAR DISC SURGERY  2011   total of 3 sx on back per pt  . MAXIMUM ACCESS (MAS)POSTERIOR LUMBAR INTERBODY FUSION (PLIF) 1 LEVEL N/A 06/10/2014   Procedure: Redo Lumbar Five-Sacral One Decompression with maximum access posterior lumbar interbody fusion;  Surgeon: Erline Levine, MD;  Location: [facility];  Service: Neurosurgery;  Laterality: N/A;  Redo Decompression with maximum access posterior lumbar interbody fusion, L5-S1    Social History:  reports that he quit smoking about 14 months ago. His smoking use included cigars. He has never used smokeless tobacco. He reports previous alcohol use. He reports previous drug use. Drug: Marijuana. Family History:  Family History  Problem Relation Age of Onset  . Hypertension Mother   . Kidney disease Mother   . Diabetes Mother   . Hypertension Father   . Diabetes Sister   . Cataracts Sister   . Seizures Brother        caused his death  . Colon cancer Neg Hx   . Esophageal cancer Neg Hx   . Rectal cancer Neg Hx   . Stomach cancer Neg Hx   . Heart disease Neg Hx   . Colon polyps Neg Hx      HOME MEDICATIONS:  Allergies as of 04/10/2020   No Known Allergies     Medication List       Accurate as of April 10, 2020 12:01 PM. If you have any questions, ask your nurse or doctor.        STOP taking these medications   pioglitazone 30 MG tablet Commonly known as: Actos Stopped by: Dorita Sciara, MD     TAKE these medications   aspirin 81 MG tablet Take 81 mg by mouth daily.   atorvastatin 80 MG tablet Commonly known as: LIPITOR daily in the afternoon.   econazole nitrate 1 % cream Apply topically 2 (two) times daily.   ezetimibe 10 MG tablet Commonly known as: ZETIA Take 1 tablet by mouth once daily   fluticasone 50 MCG/ACT nasal spray Commonly  known as: FLONASE USE 2 SPRAY(S) IN EACH NOSTRIL ONCE DAILY   furosemide 20 MG tablet Commonly known as: LASIX Take 1 tablet (20 mg total) by mouth daily as needed.   glucose blood test strip 1 each by Other route as needed for other. One Touch Verio- Use to test blood sugars once daily   ibuprofen 600 MG tablet Commonly known as: ADVIL Take 600 mg by mouth as needed.   isosorbide mononitrate 30 MG 24 hr tablet Commonly known as: IMDUR Take 0.5 tablets (15 mg total) by mouth daily.   ketoconazole 2 % cream Commonly known as: NIZORAL as needed.   metFORMIN 500 MG 24 hr tablet Commonly known as: GLUCOPHAGE-XR Take 2 tablets (1,000 mg total) by mouth daily with supper.   nitroGLYCERIN 0.4 MG SL tablet Commonly known as: NITROSTAT Place 1 tablet (0.4 mg total) under the tongue every 5 (five) minutes as needed for chest pain.   olmesartan-hydrochlorothiazide 20-12.5 MG tablet Commonly known as: BENICAR HCT Take 1 tablet by mouth daily. Take 1 tablet by mouth once daily.   OneTouch Delica Lancets 05W Misc Use to test blood sugars once daily   OneTouch Verio w/Device Kit Use to check blood sugars once daily   sildenafil 20 MG tablet Commonly known as: REVATIO Take 2-3 tablets by mouth as needed, as directed Discontinue if having headaches.   triamcinolone cream 0.5 % Commonly known as: KENALOG as needed.        OBJECTIVE:   Vital Signs: BP (!) 144/94   Pulse 82   Ht _0  (1.93 m)   Wt 215 lb 6 oz (97.7 kg)   SpO2 98%   BMI 26.22 kg/m   Wt Readings from Last 3 Encounters:  04/10/20 215 lb 6 oz (97.7 kg)  03/20/20 208 lb (94.3 kg)  02/13/20 216 lb (98 kg)     Exam: General: Pt appears well and is in NAD  Lungs: Clear with good BS bilat with no rales, rhonchi, or wheezes  Heart: RRR   Abdomen: Normoactive bowel sounds, soft, nontender, without masses or organomegaly palpable  Extremities: No pretibial edema.   Neuro: MS is good with appropriate affect,  pt is alert and Ox3       DATA REVIEWED:  Lab Results  Component Value Date   HGBA1C 6.1 03/17/2020   HGBA1C 6.0 09/12/2019   HGBA1C 6.1 05/20/2019   Lab Results  Component Value Date   MICROALBUR <0.7 03/17/2020   LDLCALC 67 03/17/2020   CREATININE 1.17 03/17/2020   Lab Results  Component Value Date   MICRALBCREAT 0.6 03/17/2020     Lab Results  Component Value Date   CHOL 169 03/17/2020  HDL 90.10 03/17/2020   LDLCALC 67 03/17/2020   TRIG 59.0 03/17/2020   CHOLHDL 2 03/17/2020         ASSESSMENT / PLAN / RECOMMENDATIONS:   1) Type 2 Diabetes Mellitus, Optimally controlled - Most recent A1c of 6.1 %. Goal A1c < 7.0 %.     - Pt was on Metformin and Pioglitazone with optimal glycemic control but recently he had to be taken off Pioglitazone due to LE edema  . Pt would like a replacement to pioglitazone to prevent hyperglycemia  - We discussed SGLT2 inhibitors as a good option. Discussed cardiovascular benefits, he was also cautioned against genital infections and encouraged to stay hydrated - If this works well for him, he may end up needed reduction in his other diuretics     MEDICATIONS: - Continue  Metformin 500 mg , 1 tablet before  Breakfast and 1 tablet before Supper  - Start Farxiga 5 mg, 1 tablet before Breakfast    EDUCATION / INSTRUCTIONS:  BG monitoring instructions: Patient is instructed to check his blood sugars 1 times a day, daily .  Call [facility] if: BG persistently < 70  . I reviewed the Rule of 15 for the treatment of hypoglycemia in detail with the patient. Literature supplied.    F/U in 3 months    Signed electronically by: Mack Guise, MD  [facility]  [facility] [address] [place], [place] [address] Phone: [phone] FAX: [phone]   CC: Seward Carol, [place] [employer] [place] [place] [address] Phone: [phone]  Fax:  [phone]  Return to Endocrinology clinic as below: Future Appointments  Date Time Provider [place]  04/10/2020  1:20 PM Molli Barrows, MD [facility] None  04/10/2020  2:40 PM Belva Crome, MD [facility]  04/20/2020 12:40 PM Andree Elk, Alvina Filbert, MD [facility] None

## 2020-04-10 NOTE — Patient Instructions (Signed)
-   Continue  Metformin 500 mg , 1 tablet before  Breakfast and 1 tablet before Supper  - Start Farxiga 5 mg, 1 tablet before Breakfast

## 2020-04-20 ENCOUNTER — Ambulatory Visit: Payer: 59 | Admitting: Anesthesiology

## 2020-04-20 ENCOUNTER — Ambulatory Visit: Payer: 59 | Admitting: Podiatry

## 2020-04-22 ENCOUNTER — Telehealth: Payer: Self-pay | Admitting: Podiatry

## 2020-04-22 NOTE — Telephone Encounter (Signed)
LVM for patient to return call to get diabetic casting appt for shoes

## 2020-04-27 ENCOUNTER — Telehealth: Payer: Self-pay | Admitting: Podiatry

## 2020-04-27 ENCOUNTER — Ambulatory Visit (INDEPENDENT_AMBULATORY_CARE_PROVIDER_SITE_OTHER): Payer: 59 | Admitting: Podiatry

## 2020-04-27 ENCOUNTER — Other Ambulatory Visit: Payer: Self-pay

## 2020-04-27 DIAGNOSIS — M2041 Other hammer toe(s) (acquired), right foot: Secondary | ICD-10-CM

## 2020-04-27 DIAGNOSIS — M2042 Other hammer toe(s) (acquired), left foot: Secondary | ICD-10-CM

## 2020-04-27 DIAGNOSIS — E1140 Type 2 diabetes mellitus with diabetic neuropathy, unspecified: Secondary | ICD-10-CM

## 2020-04-27 DIAGNOSIS — E1149 Type 2 diabetes mellitus with other diabetic neurological complication: Secondary | ICD-10-CM

## 2020-04-27 NOTE — Progress Notes (Signed)
Patient presented for foam casting for 3 pair custom diabetic shoe inserts. Patient is measured with a [place] device to be a size 15 wide  Diabetic shoes are chosen from the safe step catalog. The shoes chosen are 521  The patient will be contacted when the shoes and inserts are ready to be picked up.

## 2020-04-27 NOTE — Telephone Encounter (Signed)
Called pt because the shoe he picked out did not come in his size 15 w. I discussed an [id number] and he said that would be fine so that is what I ordered. We are limited because not many shoes come in a size 15

## 2020-05-27 ENCOUNTER — Other Ambulatory Visit: Payer: Self-pay

## 2020-05-27 ENCOUNTER — Encounter: Payer: Self-pay | Admitting: Anesthesiology

## 2020-05-27 ENCOUNTER — Telehealth: Payer: Self-pay | Admitting: Anesthesiology

## 2020-05-27 ENCOUNTER — Ambulatory Visit: Payer: 59 | Attending: Anesthesiology | Admitting: Anesthesiology

## 2020-05-27 DIAGNOSIS — G894 Chronic pain syndrome: Secondary | ICD-10-CM | POA: Diagnosis not present

## 2020-05-27 DIAGNOSIS — M5136 Other intervertebral disc degeneration, lumbar region: Secondary | ICD-10-CM

## 2020-05-27 DIAGNOSIS — M5432 Sciatica, left side: Secondary | ICD-10-CM

## 2020-05-27 DIAGNOSIS — M48062 Spinal stenosis, lumbar region with neurogenic claudication: Secondary | ICD-10-CM

## 2020-05-27 DIAGNOSIS — M961 Postlaminectomy syndrome, not elsewhere classified: Secondary | ICD-10-CM

## 2020-05-27 DIAGNOSIS — M5431 Sciatica, right side: Secondary | ICD-10-CM

## 2020-05-27 DIAGNOSIS — F1190 Opioid use, unspecified, uncomplicated: Secondary | ICD-10-CM

## 2020-05-27 MED ORDER — OXYCODONE-ACETAMINOPHEN 10-325 MG PO TABS: 1.0000 | ORAL_TABLET | Freq: Four times a day (QID) | ORAL | 0 refills | Status: AC | PRN

## 2020-05-27 MED ORDER — OXYCODONE-ACETAMINOPHEN 10-325 MG PO TABS: 1.0000 | ORAL_TABLET | Freq: Four times a day (QID) | ORAL | 0 refills | Status: DC | PRN

## 2020-05-27 NOTE — Telephone Encounter (Signed)
Called patient and he said that he slipped and fell last week. When asked if he had seen his PCP  or seek care he said no.  Advised patient that he needs to see his PCP for that and we would let Dr Andree Elk know. Patient with understanding.

## 2020-05-27 NOTE — Telephone Encounter (Signed)
Patient wants to know if he can come in on Thurs 05-28-20 to have some injections in his back from the fall. He says he can get a ride here tomorrow.

## 2020-05-27 NOTE — Progress Notes (Signed)
Virtual Visit via Telephone Note  I connected with Cameron Cardenas on 05/27/20 at  9:00 AM EDT by telephone and verified that I am speaking with the correct person using two identifiers.  Location: Patient: Home Provider: Pain control center   I discussed the limitations, risks, security and privacy concerns of performing an evaluation and management service by telephone and the availability of in person appointments. I also discussed with the patient that there may be a patient responsible charge related to this service. The patient expressed understanding and agreed to proceed.   History of Present Illness: I spoke with Cameron Cardenas via telephone as he was unable to do the video portion of the virtual conference he reports that he is been doing reasonably well.  He did sustain a recent fall which caused some exacerbation of his low back pain but this has been getting better.  This occurred approximately 3 weeks ago.  He gets some radiation of pain into the bilateral lower extremities but no new significant problems and this has been stable.  No new weakness is reported.  His bowel and bladder function been stable.  He is tolerating his oxycodone well.  He takes this 4 times a day and it continues to work well to keep his pain under good control without side effect.  Otherwise he is trying to work on basic stretching strengthening exercises as previously reviewed and this has been reasonably successful.  No other problems are noted today.   Observations/Objective:  Current Outpatient Medications:  .  [START ON 07/31/2020] oxyCODONE-acetaminophen (PERCOCET) 10-325 MG tablet, Take 1 tablet by mouth every 6 (six) hours as needed for pain., Disp: 120 tablet, Rfl: 0 .  aspirin 81 MG tablet, Take 81 mg by mouth daily., Disp: , Rfl:  .  atorvastatin (LIPITOR) 80 MG tablet, daily in the afternoon., Disp: , Rfl:  .  Blood Glucose Monitoring Suppl (ONETOUCH VERIO) w/Device KIT, Use to check blood sugars  once daily, Disp: 1 kit, Rfl: 0 .  dapagliflozin propanediol (FARXIGA) 5 MG TABS tablet, Take 1 tablet (5 mg total) by mouth daily before breakfast., Disp: 30 tablet, Rfl: 6 .  econazole nitrate 1 % cream, Apply topically 2 (two) times daily., Disp: , Rfl:  .  ezetimibe (ZETIA) 10 MG tablet, Take 1 tablet by mouth once daily, Disp: 90 tablet, Rfl: 1 .  fluticasone (FLONASE) 50 MCG/ACT nasal spray, USE 2 SPRAY(S) IN EACH NOSTRIL ONCE DAILY, Disp: , Rfl:  .  furosemide (LASIX) 20 MG tablet, Take 1 tablet (20 mg total) by mouth daily as needed., Disp: 90 tablet, Rfl: 3 .  glucose blood test strip, 1 each by Other route as needed for other. One Touch Verio- Use to test blood sugars once daily, Disp: 100 each, Rfl: 0 .  ibuprofen (ADVIL) 600 MG tablet, Take 600 mg by mouth as needed., Disp: , Rfl:  .  isosorbide mononitrate (IMDUR) 30 MG 24 hr tablet, Take 0.5 tablets (15 mg total) by mouth daily., Disp: 45 tablet, Rfl: 3 .  ketoconazole (NIZORAL) 2 % cream, as needed., Disp: , Rfl:  .  meloxicam (MOBIC) 7.5 MG tablet, Take 1 tablet (7.5 mg total) by mouth daily., Disp: 30 tablet, Rfl: 5 .  metFORMIN (GLUCOPHAGE-XR) 500 MG 24 hr tablet, Take 2 tablets (1,000 mg total) by mouth daily with supper., Disp: 180 tablet, Rfl: 1 .  nitroGLYCERIN (NITROSTAT) 0.4 MG SL tablet, Place 1 tablet (0.4 mg total) under the tongue every 5 (five) minutes  as needed for chest pain. (Patient not taking: Reported on 03/20/2020), Disp: 25 tablet, Rfl: 3 .  olmesartan-hydrochlorothiazide (BENICAR HCT) 20-12.5 MG tablet, Take 1 tablet by mouth daily. Take 1 tablet by mouth once daily., Disp: , Rfl:  .  OneTouch Delica Lancets 38V MISC, Use to test blood sugars once daily, Disp: 100 each, Rfl: 0 .  [START ON 06/01/2020] oxyCODONE-acetaminophen (PERCOCET) 10-325 MG tablet, Take 1 tablet by mouth every 6 (six) hours as needed for pain., Disp: 120 tablet, Rfl: 0 .  [START ON 07/01/2020] oxyCODONE-acetaminophen (PERCOCET) 10-325 MG  tablet, Take 1 tablet by mouth every 6 (six) hours as needed for pain., Disp: 120 tablet, Rfl: 0 .  sildenafil (REVATIO) 20 MG tablet, Take 2-3 tablets by mouth as needed, as directed Discontinue if having headaches., Disp: 30 tablet, Rfl: 1 .  triamcinolone cream (KENALOG) 0.5 %, as needed., Disp: , Rfl:   Assessment and Plan: 1. Spinal stenosis of lumbar region with neurogenic claudication   2. Chronic, continuous use of opioids   3. Chronic pain syndrome   4. Bilateral sciatica   5. Lumbar post-laminectomy syndrome   6. DDD (degenerative disc disease), lumbar   7. Failed back syndrome of lumbar spine   Based on her discussion today and upon review of the [place] practitioner database information going to refill his medicines for the next 2 months.  He has an outstanding prescription for May 23 and refills will be given today for June 22 and July 22 for Percocet 10/13/2023 for 4 times a day dosing.  I want her to continue efforts at weight loss stretching strengthening as reviewed and we will schedule him for routine urine screen which she is due for.  No other changes will be made in his pain management protocol today.  I encouraged him to continue follow-up with his primary care physicians for his baseline medical care.  Follow Up Instructions:    I discussed the assessment and treatment plan with the patient. The patient was provided an opportunity to ask questions and all were answered. The patient agreed with the plan and demonstrated an understanding of the instructions.   The patient was advised to call back or seek an in-person evaluation if the symptoms worsen or if the condition fails to improve as anticipated.  I provided 30 minutes of non-face-to-face time during this encounter.   Molli Barrows, MD

## 2020-05-28 ENCOUNTER — Other Ambulatory Visit: Payer: Self-pay | Admitting: Internal Medicine

## 2020-05-28 ENCOUNTER — Telehealth: Payer: Self-pay | Admitting: Podiatry

## 2020-05-28 ENCOUNTER — Ambulatory Visit
Admission: RE | Admit: 2020-05-28 | Discharge: 2020-05-28 | Disposition: A | Discharge status: Home / Self Care | Payer: 59 | Source: Ambulatory Visit | Attending: Internal Medicine | Admitting: Internal Medicine

## 2020-05-28 DIAGNOSIS — M546 Pain in thoracic spine: Secondary | ICD-10-CM

## 2020-05-28 NOTE — Telephone Encounter (Signed)
Pt called and left message checking on status of diabetic shoes.  Upon looking it looks like it was not sent corrently to the pcp to sign the documents and I have sent that today.   I left message for pt that they have the order just waiting on the documents and it looked like there was a glitch and so I sent the documents today and will follow up with the company as well.

## 2020-05-29 ENCOUNTER — Telehealth: Payer: Self-pay | Admitting: Podiatry

## 2020-05-29 NOTE — Telephone Encounter (Signed)
Pt left message for a call back about his shoes.    I returned call and he said he talked with Dr Lina Sar office and they were to be calling me. I did receive a call from [place] but no pt name was left and I called back and had to leave a message for her to call me back Monday as I was leaving early today.  I told pt if I did not hear back on Monday I would call again.

## 2020-06-01 ENCOUNTER — Ambulatory Visit: Payer: 59 | Admitting: Podiatry

## 2020-06-10 LAB — TOXASSURE SELECT 13 (MW), URINE

## 2020-06-11 ENCOUNTER — Telehealth: Payer: Self-pay | Admitting: Podiatry

## 2020-06-11 NOTE — Telephone Encounter (Signed)
Pt left message checking to see if we got the needed documents for his diabetic shoes.    I returned call and notified pt that we did get the needed documents and the inserts are in production and should be shipping in the next 2 wks or so and I would call when they come in to schedule an appt to pick them up.

## 2020-06-22 ENCOUNTER — Encounter: Payer: Self-pay | Admitting: Podiatry

## 2020-06-22 ENCOUNTER — Other Ambulatory Visit: Payer: Self-pay

## 2020-06-22 ENCOUNTER — Other Ambulatory Visit (INDEPENDENT_AMBULATORY_CARE_PROVIDER_SITE_OTHER): Payer: 59

## 2020-06-22 ENCOUNTER — Ambulatory Visit (INDEPENDENT_AMBULATORY_CARE_PROVIDER_SITE_OTHER): Payer: 59 | Admitting: Podiatry

## 2020-06-22 DIAGNOSIS — M2042 Other hammer toe(s) (acquired), left foot: Secondary | ICD-10-CM

## 2020-06-22 DIAGNOSIS — B351 Tinea unguium: Secondary | ICD-10-CM | POA: Diagnosis not present

## 2020-06-22 DIAGNOSIS — E1140 Type 2 diabetes mellitus with diabetic neuropathy, unspecified: Secondary | ICD-10-CM

## 2020-06-22 DIAGNOSIS — M79674 Pain in right toe(s): Secondary | ICD-10-CM | POA: Diagnosis not present

## 2020-06-22 DIAGNOSIS — M79675 Pain in left toe(s): Secondary | ICD-10-CM

## 2020-06-22 DIAGNOSIS — M2041 Other hammer toe(s) (acquired), right foot: Secondary | ICD-10-CM | POA: Diagnosis not present

## 2020-06-22 DIAGNOSIS — E119 Type 2 diabetes mellitus without complications: Secondary | ICD-10-CM

## 2020-06-22 DIAGNOSIS — E1149 Type 2 diabetes mellitus with other diabetic neurological complication: Secondary | ICD-10-CM

## 2020-06-22 LAB — BASIC METABOLIC PANEL
BUN: 16 mg/dL (ref 6–23)
CO2: 29 mEq/L (ref 19–32)
Calcium: 9 mg/dL (ref 8.4–10.5)
Chloride: 108 mEq/L (ref 96–112)
Creatinine, Ser: 1.26 mg/dL (ref 0.40–1.50)
GFR: 59.68 mL/min — ABNORMAL LOW (ref >60.00)
Glucose, Bld: 104 mg/dL — ABNORMAL HIGH (ref 70–99)
Potassium: 3.9 mEq/L (ref 3.5–5.1)
Sodium: 143 mEq/L (ref 135–145)

## 2020-06-22 LAB — HEMOGLOBIN A1C: Hgb A1c MFr Bld: 6.1 % (ref 4.6–6.5)

## 2020-06-23 NOTE — Progress Notes (Signed)
Subjective:   Patient ID: Cameron Cardenas, male   DOB: 65 y.o.   MRN: [id number]   HPI Patient presents to pick up diabetic shoes stating that his feet are relatively stable but he has thick nailbeds that he cannot take care of and he is a diabetic   ROS      Objective:  Physical Exam  Neurovascular status unchanged with digital deformities moderate reduction of sharp dull vibratory with thick yellow brittle nailbeds noted     Assessment:  Long-term diabetic without risk factors with mycotic nail infection hammertoe deformity with pain     Plan:  Diabetic shoes fitted and he states fit well and are comfortable with inserts and today I debrided nailbeds 1-5 both feet.  Reappoint for routine care and if any issues were to occur with diabetic shoes

## 2020-06-29 ENCOUNTER — Other Ambulatory Visit: Payer: Self-pay

## 2020-06-29 ENCOUNTER — Ambulatory Visit (INDEPENDENT_AMBULATORY_CARE_PROVIDER_SITE_OTHER): Payer: 59 | Admitting: Endocrinology

## 2020-06-29 VITALS — BP 150/92 | HR 77 | Ht 75.0 in | Wt 213.8 lb

## 2020-06-29 DIAGNOSIS — E119 Type 2 diabetes mellitus without complications: Secondary | ICD-10-CM

## 2020-06-29 DIAGNOSIS — E785 Hyperlipidemia, unspecified: Secondary | ICD-10-CM

## 2020-06-29 DIAGNOSIS — I10 Essential (primary) hypertension: Secondary | ICD-10-CM | POA: Diagnosis not present

## 2020-06-29 MED ORDER — XIGDUO XR 5-1000 MG PO TB24: 1.0000 | ORAL_TABLET | Freq: Every day | ORAL | 2 refills | Status: DC

## 2020-06-29 NOTE — Patient Instructions (Signed)
Walk daily  Check blood sugars on waking up 1-2 days a week  Also check blood sugars about 2 hours after meals and do this after different meals by rotation  Recommended blood sugar levels on waking up are 90-130 and about 2 hours after meal is 130-160  Please bring your blood sugar monitor to each visit, thank you   

## 2020-06-29 NOTE — Progress Notes (Signed)
Patient ID: Cameron Cardenas, male   DOB: 1955-03-15, 65 y.o.   MRN: [id number]   Reason for Appointment: Endocrinology follow-up      History of Present Illness   Diagnosis: Type 2 DIABETES MELITUS, date of diagnosis: 2004  PAST history: He had mild diabetes at onset and was treated with metformin and subsequently Actoplusmet In 2013 he had changed his diet significantly and started exercising. This helped him with weight loss Subsequently his blood sugars have been excellent with A1c upper normal  RECENT history:   Oral hypoglycemic drugs: Actos 30 mg daily, Metformin 1 g twice daily  His A1c is about the same at 6.1   Current management, problems and blood sugar patterns: His metformin was reduced to 2 tablets instead of 4 tablets previously and 3/22 because of diarrhea and he thought he was getting headaches from this also Also apparently his PCP stopped his Actos in March because of left foot edema even though he had been on stable doses of Actos for several years before His meter could not be downloaded and not clear what meter he is using, his insurance does not cover freestyle libre He says that only when he is eating during the night his fasting range may be up to about 130 Otherwise he thinks blood sugars are fairly good at different times Previously has been checking mostly in the morning He says he is tolerating the Wilder Glade that was started by my colleague in April instead of Actos His weight is down only 2 pounds He says he is trying to do some walking             Side effects from medications: None  Monitors blood glucose every other day: Using meter: One Touch  Blood sugars from patient 117-130  PRE-MEAL Fasting Lunch Dinner Bedtime Overall  Glucose range:  110-118  113, 125  113 ?   Mean/median:     ?    Dietician visit: Most recent: 12/13               Wt Readings from Last 3 Encounters:  06/29/20 213 lb 12.8 oz (97 kg)  04/10/20 215 lb 6  oz (97.7 kg)  03/20/20 208 lb (94.3 kg)   Lab Results  Component Value Date   HGBA1C 6.1 06/22/2020   HGBA1C 6.1 03/17/2020   HGBA1C 6.0 09/12/2019   Lab Results  Component Value Date   MICROALBUR <0.7 03/17/2020   [place] 67 03/17/2020   CREATININE 1.26 06/22/2020    No visits with results within 1 Week(s) from this visit.  Latest known visit with results is:  Lab on 06/22/2020  Component Date Value Ref Range Status   Sodium 06/22/2020 143  135 - 145 mEq/L Final   Potassium 06/22/2020 3.9  3.5 - 5.1 mEq/L Final   Chloride 06/22/2020 108  96 - 112 mEq/L Final   CO2 06/22/2020 29  19 - 32 mEq/L Final   Glucose, Bld 06/22/2020 104 (A) 70 - 99 mg/dL Final   BUN 06/22/2020 16  6 - 23 mg/dL Final   Creatinine, Ser 06/22/2020 1.26  0.40 - 1.50 mg/dL Final   GFR 06/22/2020 59.68 (A) >60.00 mL/min Final   Calculated using the CKD-EPI Creatinine Equation (2021)   Calcium 06/22/2020 9.0  8.4 - 10.5 mg/dL Final   Hgb A1c MFr Bld 06/22/2020 6.1  4.6 - 6.5 % Final   Glycemic Control Guidelines for People with Diabetes:Non Diabetic:  <6%Goal of Therapy: <7%Additional Action  Suggested:  >8%      Allergies as of 06/29/2020   No Known Allergies      Medication List        Accurate as of June 29, 2020  4:11 PM. If you have any questions, ask your nurse or doctor.          aspirin 81 MG tablet Take 81 mg by mouth daily.   atorvastatin 80 MG tablet Commonly known as: LIPITOR daily in the afternoon.   dapagliflozin propanediol 5 MG Tabs tablet Commonly known as: Farxiga Take 1 tablet (5 mg total) by mouth daily before breakfast.   econazole nitrate 1 % cream Apply topically 2 (two) times daily.   ezetimibe 10 MG tablet Commonly known as: ZETIA Take 1 tablet by mouth once daily   fluticasone 50 MCG/ACT nasal spray Commonly known as: FLONASE USE 2 SPRAY(S) IN EACH NOSTRIL ONCE DAILY   furosemide 20 MG tablet Commonly known as: LASIX Take 1 tablet (20 mg total) by  mouth daily as needed.   glucose blood test strip 1 each by Other route as needed for other. One Touch Verio- Use to test blood sugars once daily   ibuprofen 600 MG tablet Commonly known as: ADVIL Take 600 mg by mouth as needed.   isosorbide mononitrate 30 MG 24 hr tablet Commonly known as: IMDUR Take 0.5 tablets (15 mg total) by mouth daily.   ketoconazole 2 % cream Commonly known as: NIZORAL as needed.   meloxicam 7.5 MG tablet Commonly known as: Mobic Take 1 tablet (7.5 mg total) by mouth daily.   metFORMIN 500 MG 24 hr tablet Commonly known as: GLUCOPHAGE-XR Take 2 tablets (1,000 mg total) by mouth daily with supper.   nitroGLYCERIN 0.4 MG SL tablet Commonly known as: NITROSTAT Place 1 tablet (0.4 mg total) under the tongue every 5 (five) minutes as needed for chest pain.   olmesartan-hydrochlorothiazide 20-12.5 MG tablet Commonly known as: BENICAR HCT Take 1 tablet by mouth daily. Take 1 tablet by mouth once daily.   OneTouch Delica Lancets 70W Misc Use to test blood sugars once daily   OneTouch Verio w/Device Kit Use to check blood sugars once daily   oxyCODONE-acetaminophen 10-325 MG tablet Commonly known as: Percocet Take 1 tablet by mouth every 6 (six) hours as needed for pain.   oxyCODONE-acetaminophen 10-325 MG tablet Commonly known as: Percocet Take 1 tablet by mouth every 6 (six) hours as needed for pain. Start taking on: July 01, 2020   oxyCODONE-acetaminophen 10-325 MG tablet Commonly known as: Percocet Take 1 tablet by mouth every 6 (six) hours as needed for pain. Start taking on: July 31, 2020   sildenafil 20 MG tablet Commonly known as: REVATIO Take 2-3 tablets by mouth as needed, as directed Discontinue if having headaches.   triamcinolone cream 0.5 % Commonly known as: KENALOG as needed.        Allergies:  No Known Allergies  Past Medical History:  Diagnosis Date   Allergic rhinitis due to pollen    Anal fissure    Bilateral  sciatica 07/09/2018   Chronic pain syndrome 07/09/2018   Chronic, continuous use of opioids 07/09/2018   DDD (degenerative disc disease), lumbar    Essential hypertension, malignant    Hemorrhoids    Lumbago    Lumbar post-laminectomy syndrome 07/09/2018   Plantar fasciitis of left foot    Pure hypercholesterolemia    Spinal stenosis of lumbar region 07/09/2018   Type II or unspecified type diabetes  mellitus without mention of complication, not stated as uncontrolled    dx in 2005   Unspecified vitamin D deficiency     Past Surgical History:  Procedure Laterality Date   COLONOSCOPY     in [facility], MD no longer in practice, does not recall the name of facility. Does belive polyps were removed   LUMBAR DISC SURGERY  2011   total of 3 sx on back per pt   MAXIMUM ACCESS (MAS)POSTERIOR LUMBAR INTERBODY FUSION (PLIF) 1 LEVEL N/A 06/10/2014   Procedure: Redo Lumbar Five-Sacral One Decompression with maximum access posterior lumbar interbody fusion;  Surgeon: Maeola Harman, MD;  Location: [facility];  Service: Neurosurgery;  Laterality: N/A;  Redo Decompression with maximum access posterior lumbar interbody fusion, L5-S1    Family History  Problem Relation Age of Onset   Hypertension Mother    Kidney disease Mother    Diabetes Mother    Hypertension Father    Diabetes Sister    Cataracts Sister    Seizures Brother        caused his death   Colon cancer Neg Hx    Esophageal cancer Neg Hx    Rectal cancer Neg Hx    Stomach cancer Neg Hx    Heart disease Neg Hx    Colon polyps Neg Hx     Social History:  reports that he quit smoking about 17 months ago. His smoking use included cigars. He has never used smokeless tobacco. He reports previous alcohol use. He reports previous drug use. Drug: Marijuana.   No visits with results within 1 Week(s) from this visit.  Latest known visit with results is:  Lab on 06/22/2020  Component Date Value Ref Range Status   Sodium 06/22/2020 143  135 - 145  mEq/L Final   Potassium 06/22/2020 3.9  3.5 - 5.1 mEq/L Final   Chloride 06/22/2020 108  96 - 112 mEq/L Final   CO2 06/22/2020 29  19 - 32 mEq/L Final   Glucose, Bld 06/22/2020 104 (A) 70 - 99 mg/dL Final   BUN 30/05/1100 16  6 - 23 mg/dL Final   Creatinine, Ser 06/22/2020 1.26  0.40 - 1.50 mg/dL Final   GFR 11/26/3565 59.68 (A) >60.00 mL/min Final   Calculated using the CKD-EPI Creatinine Equation (2021)   Calcium 06/22/2020 9.0  8.4 - 10.5 mg/dL Final   Hgb O1I MFr Bld 06/22/2020 6.1  4.6 - 6.5 % Final   Glycemic Control Guidelines for People with Diabetes:Non Diabetic:  <6%Goal of Therapy: <7%Additional Action Suggested:  >8%     Review of Systems:  HYPERTENSION:   Treated with Benicar HCT, followed by PCP  Appears that he is not taking this recently and his blood pressure appears to be consistently higher and higher today  Not monitoring at home  BP Readings from Last 3 Encounters:  06/29/20 (!) 150/92  04/10/20 140/90  02/13/20 118/74   No history of renal dysfunction or microalbuminuria Creatinine slightly higher, he was given Mobic by pain management in April  Lab Results  Component Value Date   CREATININE 1.26 06/22/2020   CREATININE 1.17 03/17/2020   CREATININE 1.23 02/13/2020     HYPERLIPIDEMIA: The lipid abnormality consists of elevated LDL  He had been on 80 mg atorvastatin since 6/16 Previously did have LDL particle number of 1348 previously and which was relatively high at 1216 in 6/16  With Crestor 20 mg and Zetia daily his LDL is at target  Labs as follows:  Lab Results  Component Value Date   CHOL 169 03/17/2020   CHOL 136 05/20/2019   CHOL 155 10/30/2018   Lab Results  Component Value Date   HDL 90.10 03/17/2020   HDL 65.10 05/20/2019   HDL 69.40 10/30/2018   Lab Results  Component Value Date   LDLCALC 67 03/17/2020   LDLCALC 58 05/20/2019   LDLCALC 72 10/30/2018   Lab Results  Component Value Date   TRIG 59.0 03/17/2020   TRIG  66.0 05/20/2019   TRIG 64.0 10/30/2018   Lab Results  Component Value Date   CHOLHDL 2 03/17/2020   CHOLHDL 2 05/20/2019   CHOLHDL 2 10/30/2018   No results found for: LDLDIRECT  Lab Results  Component Value Date   CHOL 169 03/17/2020   HDL 90.10 03/17/2020   LDLCALC 67 03/17/2020   TRIG 59.0 03/17/2020   CHOLHDL 2 03/17/2020    Foot exam done by podiatrist    Examination:   BP (!) 150/92   Pulse 77   Ht $R'6\' 3"'kN$  (1.905 m)   Wt 213 lb 12.8 oz (97 kg)   SpO2 98%   BMI 26.72 kg/m   Body mass index is 26.72 kg/m.      ASSESSMENT/ PLAN:   Diabetes type 2, nonobese  He has had mild diabetes with consistently good A1c levels in the normal range  He has been on Farxiga 5 mg and Metformin 1 g twice daily   A1c is stable at 6.1 Lab fasting glucose was 104 and he reports fairly good readings at home although not clear how often he checks especially after meals He thinks he is doing fairly well in his diet and trying to do some walking Weight is about the same He thinks he wants to reduce his medications and wants to stop metformin, however not having any diarrhea currently with this Discussed that it is unlikely that this is causing headaches as his headaches may be related to continued hypertension  Recommendations: Discussed need to treat insulin resistance with metformin Also discussed long-term benefits with Wilder Glade For simplicity we will switch him to [place] after he finishes current regimen  HYPERTENSION: Poorly controlled and he needs to ask his PCP to start his Benicar HCT again Also likely needs to minimize or stop use of NSAIDs like Mobic  Continue to follow renal function   There are no Patient Instructions on file for this visit.  Elayne Snare 06/29/2020, 4:11 PM     Note: This office note was prepared with Dragon voice recognition system technology. Any transcriptional errors that result from this process are unintentional.

## 2020-07-02 ENCOUNTER — Emergency Department (HOSPITAL_COMMUNITY)
Admission: EM | Admit: 2020-07-02 | Discharge: 2020-07-02 | Disposition: A | Discharge status: Home / Self Care | Payer: 59 | Attending: Emergency Medicine | Admitting: Emergency Medicine

## 2020-07-02 ENCOUNTER — Telehealth: Payer: Self-pay | Admitting: Interventional Cardiology

## 2020-07-02 ENCOUNTER — Other Ambulatory Visit: Payer: Self-pay

## 2020-07-02 ENCOUNTER — Emergency Department (HOSPITAL_COMMUNITY): Payer: 59

## 2020-07-02 DIAGNOSIS — Z7984 Long term (current) use of oral hypoglycemic drugs: Secondary | ICD-10-CM | POA: Diagnosis not present

## 2020-07-02 DIAGNOSIS — R0602 Shortness of breath: Secondary | ICD-10-CM | POA: Diagnosis not present

## 2020-07-02 DIAGNOSIS — R110 Nausea: Secondary | ICD-10-CM | POA: Insufficient documentation

## 2020-07-02 DIAGNOSIS — R0789 Other chest pain: Secondary | ICD-10-CM | POA: Diagnosis present

## 2020-07-02 DIAGNOSIS — E1169 Type 2 diabetes mellitus with other specified complication: Secondary | ICD-10-CM | POA: Diagnosis not present

## 2020-07-02 DIAGNOSIS — I10 Essential (primary) hypertension: Secondary | ICD-10-CM | POA: Diagnosis not present

## 2020-07-02 DIAGNOSIS — Z7982 Long term (current) use of aspirin: Secondary | ICD-10-CM | POA: Insufficient documentation

## 2020-07-02 DIAGNOSIS — Z87891 Personal history of nicotine dependence: Secondary | ICD-10-CM | POA: Diagnosis not present

## 2020-07-02 DIAGNOSIS — R079 Chest pain, unspecified: Secondary | ICD-10-CM

## 2020-07-02 DIAGNOSIS — Z79899 Other long term (current) drug therapy: Secondary | ICD-10-CM | POA: Insufficient documentation

## 2020-07-02 LAB — CBC WITH DIFFERENTIAL/PLATELET
Abs Immature Granulocytes: 0 10*3/uL (ref 0.00–0.07)
Basophils Absolute: 0 10*3/uL (ref 0.0–0.1)
Basophils Relative: 1 %
Eosinophils Absolute: 0.1 10*3/uL (ref 0.0–0.5)
Eosinophils Relative: 2 %
HCT: 46.1 % (ref 39.0–52.0)
Hemoglobin: 15.2 g/dL (ref 13.0–17.0)
Immature Granulocytes: 0 %
Lymphocytes Relative: 32 %
Lymphs Abs: 1 10*3/uL (ref 0.7–4.0)
MCH: 28.2 pg (ref 26.0–34.0)
MCHC: 33 g/dL (ref 30.0–36.0)
MCV: 85.5 fL (ref 80.0–100.0)
Monocytes Absolute: 0.3 10*3/uL (ref 0.1–1.0)
Monocytes Relative: 11 %
Neutro Abs: 1.7 10*3/uL (ref 1.7–7.7)
Neutrophils Relative %: 54 %
Platelets: 131 10*3/uL — ABNORMAL LOW (ref 150–400)
RBC: 5.39 MIL/uL (ref 4.22–5.81)
RDW: 14.2 % (ref 11.5–15.5)
WBC: 3.1 10*3/uL — ABNORMAL LOW (ref 4.0–10.5)
nRBC: 0 % (ref 0.0–0.2)

## 2020-07-02 LAB — BRAIN NATRIURETIC PEPTIDE: B Natriuretic Peptide: 21.4 pg/mL (ref 0.0–100.0)

## 2020-07-02 LAB — COMPREHENSIVE METABOLIC PANEL
ALT: 27 U/L (ref 0–44)
AST: 29 U/L (ref 15–41)
Albumin: 3.8 g/dL (ref 3.5–5.0)
Alkaline Phosphatase: 48 U/L (ref 38–126)
Anion gap: 10 (ref 5–15)
BUN: 17 mg/dL (ref 8–23)
CO2: 26 mmol/L (ref 22–32)
Calcium: 9.1 mg/dL (ref 8.9–10.3)
Chloride: 101 mmol/L (ref 98–111)
Creatinine, Ser: 1.46 mg/dL — ABNORMAL HIGH (ref 0.61–1.24)
GFR, Estimated: 53 mL/min — ABNORMAL LOW (ref >60)
Glucose, Bld: 238 mg/dL — ABNORMAL HIGH (ref 70–99)
Potassium: 3.5 mmol/L (ref 3.5–5.1)
Sodium: 137 mmol/L (ref 135–145)
Total Bilirubin: 1.2 mg/dL (ref 0.3–1.2)
Total Protein: 6.2 g/dL — ABNORMAL LOW (ref 6.5–8.1)

## 2020-07-02 LAB — TROPONIN I (HIGH SENSITIVITY)
Troponin I (High Sensitivity): 9 ng/L (ref <18)
Troponin I (High Sensitivity): 10 ng/L (ref <18)

## 2020-07-02 MED ORDER — ASPIRIN 81 MG PO CHEW
324.0000 mg | CHEWABLE_TABLET | Freq: Once | ORAL | Status: AC
  Administered 2020-07-02: 324 mg via ORAL

## 2020-07-02 NOTE — Telephone Encounter (Signed)
Called patient back about his message. Patient stated he was in the ED last night for chest pain and SOB with activity. Patient stated he was told to follow up with Dr. Tamala Julian.  Informed patient that Dr. Thompson Caul next available appointment is in November. Patient stated he only wants to see Dr. Tamala Julian and no one else. Will forward to [place] his nurse to help find a spot for him. Informed patient that a message would be sent to Dr. Tamala Julian for advisement in the mean time until he can be seen. Encouraged patient to go back to ED if his symptoms worsen. Patient asked about nitroglycerin, patient does not have any. Asked patient if he takes sildenafil, because he cannot take this medication with nitroglycerin. Patient stated he has not taken any sildenafil. Will see if Dr. Tamala Julian wants to order nitro for patient to take for chest pain.

## 2020-07-02 NOTE — ED Notes (Signed)
Pt not in room at this time

## 2020-07-02 NOTE — ED Notes (Addendum)
RN notified of pt BP

## 2020-07-02 NOTE — ED Provider Notes (Signed)
MSE was initiated and I personally evaluated the patient and placed orders (if any) at  12:52 AM on July 02, 2020.   Patient with h/o HTN, T2DM presents with chest pain, constant since onset around 9:00, worse with exertion, associated with SOB. No peripheral edema. Reports he has been seen by Dr. Daneen Schick for same type pain - denies h/o MI, or previous catheterizations.  Today's Vitals   07/02/20 0029 07/02/20 0032  BP: 103/65   Pulse: 92   Resp: 16   Temp: 97.9 F (36.6 C)   TempSrc: Oral   SpO2: 96%   Weight:  107 kg  Height:  6\' 4"  (1.93 m)  PainSc:  7    Body mass index is 28.71 kg/m.  RRR, no murmur Lungs clear No peripheral edema  The patient appears stable so that the remainder of the MSE may be completed by another provider.   Charlann Lange, PA-C 81/15/72 [id number]    Delora Fuel, MD 55/97/41 [phone]

## 2020-07-02 NOTE — ED Notes (Signed)
Provider at bedside

## 2020-07-02 NOTE — Telephone Encounter (Signed)
Pt c/o of Chest Pain: STAT if CP now or developed within 24 hours  1. Are you having CP right now? Not at this time- had to go to  the ER last night- was told to follow up with Dr Tamala Julian  2. Are you experiencing any other symptoms (ex. SOB, nausea, vomiting, sweating)? Had still having some shortness of breath at this time  3. How long have you been experiencing CP? Last night about 9:30  4. Is your CP continuous or coming and going? continuous  5. Have you taken Nitroglycerin? No, they gave 4 Aspirin ?

## 2020-07-02 NOTE — ED Provider Notes (Signed)
[place] EMERGENCY DEPARTMENT Provider Note   CSN: [id number] Arrival date & time: 07/02/20  0008     History Chief Complaint  Patient presents with   Chest Pain    Cameron Cardenas is a 65 y.o. male.  65 year old male who presents to the emerged from today with recurrent chest pain.  Patient states that around 9:00 he had acute onset of chest pressure.  Stated is related nausea.  Had no other associated symptoms.  States he got a little better after he ate.  He states they last had a meal at 12:30 in the afternoon.  He had episodes like this in the past.  Has been worked up by his cardiologist multiple times.  He was supposed to get a catheterization back in February however was denied by his insurance and is was to get a coronary CTA for which he never got done.   Chest Pain     Past Medical History:  Diagnosis Date   Allergic rhinitis due to pollen    Anal fissure    Bilateral sciatica 07/09/2018   Chronic pain syndrome 07/09/2018   Chronic, continuous use of opioids 07/09/2018   DDD (degenerative disc disease), lumbar    Essential hypertension, malignant    Hemorrhoids    Lumbago    Lumbar post-laminectomy syndrome 07/09/2018   Plantar fasciitis of left foot    Pure hypercholesterolemia    Spinal stenosis of lumbar region 07/09/2018   Type II or unspecified type diabetes mellitus without mention of complication, not stated as uncontrolled    dx in 2005   Unspecified vitamin D deficiency     Patient Active Problem List   Diagnosis Date Noted   Primary osteoarthritis of right knee 04/03/2019   Primary osteoarthritis of left knee 04/03/2019   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   Constipation due to opioid therapy 10/30/2018   Chronic, continuous use of opioids 07/09/2018   Chronic pain syndrome 07/09/2018   Spinal stenosis of lumbar region 07/09/2018   Lumbar post-laminectomy syndrome 07/09/2018   Bilateral sciatica 07/09/2018   Short  lasting unilateral neuralgiform headache with conjunctival injection and tearing (SUNCT), not intractable 02/01/2018   Temporal pain 02/01/2018   Prolapsed internal hemorrhoids, grade 2 11/01/2017   Palpitations 07/31/2016   Routine general medical examination at a health care facility 12/09/2015   Essential hypertension 09/03/2015   Allergic rhinitis due to pollen 09/03/2015   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 Does belive polyps were removed   [place] SURGERY  2011   total of 3 sx on back per pt   MAXIMUM ACCESS (MAS)POSTERIOR LUMBAR INTERBODY FUSION (PLIF) 1 LEVEL N/A 06/10/2014   Procedure: Redo Lumbar Five-Sacral One Decompression with maximum access posterior lumbar interbody fusion;  Surgeon: Erline Levine, MD;  Location: [facility];  Service: Neurosurgery;  Laterality: N/A;  Redo Decompression with maximum access posterior lumbar interbody fusion, L5-S1       Family History  Problem Relation Age of Onset   Hypertension Mother    Kidney disease Mother    Diabetes Mother    Hypertension Father  Diabetes Sister    Cataracts Sister    Seizures Brother        caused his death   Colon cancer Neg Hx    Esophageal cancer Neg Hx    Rectal cancer Neg Hx    Stomach cancer Neg Hx    Heart disease Neg Hx    Colon polyps Neg Hx     Social History   Tobacco Use   Smoking status: Former    Pack years: 0.00    Types: Cigars    Quit date: 01/11/2019    Years since quitting: 1.4   Smokeless tobacco: Never  Vaping Use   Vaping Use: Never used  Substance Use Topics   Alcohol use: Not Currently    Comment: 6 pack beer  lasts a month   Drug use: Not Currently    Types: Marijuana    Home Medications Prior to Admission medications   Medication Sig Start Date End Date Taking? Authorizing Provider  aspirin 81 MG tablet Take 81 mg by mouth daily.    [provider]  atorvastatin (LIPITOR) 80 MG tablet daily in the afternoon.    [provider]  Blood Glucose Monitoring Suppl (ONETOUCH VERIO) w/Device KIT Use to check blood sugars once daily 09/17/19   Elayne Snare, MD  Dapagliflozin-metFORMIN HCl ER (XIGDUO XR) 05-998 MG TB24 Take 1 tablet by mouth daily. 06/29/20   Elayne Snare, MD  econazole nitrate 1 % cream Apply topically 2 (two) times daily. 08/05/19   [provider]  ezetimibe (ZETIA) 10 MG tablet Take 1 tablet by mouth once daily 02/27/20   Elayne Snare, MD  fluticasone [facility]) 50 MCG/ACT nasal spray USE 2 SPRAY(S) IN EACH NOSTRIL ONCE DAILY 05/25/18   [provider]  furosemide (LASIX) 20 MG tablet Take 1 tablet (20 mg total) by mouth daily as needed. 02/13/20 05/13/20  Kathyrn Drown D, NP  glucose blood test strip 1 each by Other route as needed for other. One Touch Verio- Use to test blood sugars once daily 09/17/19   Elayne Snare, MD  ibuprofen (ADVIL) 600 MG tablet Take 600 mg by mouth as needed. 12/12/19   [provider]  isosorbide mononitrate (IMDUR) 30 MG 24 hr tablet Take 0.5 tablets (15 mg total) by mouth daily. 02/13/20 05/13/20  Kathyrn Drown D, NP  ketoconazole (NIZORAL) 2 % cream as needed. 11/19/19   [provider]  meloxicam (MOBIC) 7.5 MG tablet Take 1 tablet (7.5 mg total) by mouth daily. 04/10/20   Molli Barrows, MD  olmesartan-hydrochlorothiazide (BENICAR HCT) 20-12.5 MG tablet Take 1 tablet by mouth daily. Take 1 tablet by mouth once daily. Patient not taking: Reported on 06/29/2020    [provider]  OneTouch Delica Lancets 79K MISC Use to test blood sugars once daily 09/17/19   Elayne Snare, MD  oxyCODONE-acetaminophen (PERCOCET) 10-325  MG tablet Take 1 tablet by mouth every 6 (six) hours as needed for pain. 07/01/20 07/31/20  Molli Barrows, MD  oxyCODONE-acetaminophen (PERCOCET) 10-325 MG tablet Take 1 tablet by mouth every 6 (six) hours as needed for pain. 07/31/20 08/30/20  Molli Barrows, MD  sildenafil (REVATIO) 20 MG tablet Take 2-3 tablets by mouth as needed, as directed Discontinue if having headaches. Patient not taking: Reported on 06/29/2020 04/10/20   Elayne Snare, MD  triamcinolone cream (KENALOG) 0.5 % as needed. 04/10/18   [provider]    Allergies    Patient has no known allergies.  Review of  Systems   Review of Systems  Cardiovascular:  Positive for chest pain.  All other systems reviewed and are negative.  Physical Exam Updated Vital Signs BP 105/63 (BP Location: Right Arm)   Pulse 72   Temp (!) 97.5 F (36.4 C) (Oral)   Resp 16   Ht _0  (1.93 m)   Wt 107 kg   SpO2 98%   BMI 28.71 kg/m   Physical Exam Vitals and nursing note reviewed.  Constitutional:      Appearance: He is well-developed.  HENT:     Head: Normocephalic and atraumatic.  Cardiovascular:     Rate and Rhythm: Normal rate.  Pulmonary:     Effort: Pulmonary effort is normal. No respiratory distress.  Chest:     Chest wall: No mass, deformity or tenderness.  Abdominal:     General: There is no distension.     Palpations: Abdomen is soft.  Musculoskeletal:        General: Normal range of motion.     Cervical back: Normal range of motion.  Skin:    General: Skin is warm and dry.  Neurological:     Mental Status: He is alert.    ED Results / Procedures / Treatments   Labs (all labs ordered are listed, but only abnormal results are displayed) Labs Reviewed  CBC WITH DIFFERENTIAL/PLATELET - Abnormal; Notable for the following components:      Result Value   WBC 3.1 (*)    Platelets 131 (*)    All other components within normal limits  COMPREHENSIVE METABOLIC PANEL - Abnormal; Notable for the following  components:   Glucose, Bld 238 (*)    Creatinine, Ser 1.46 (*)    Total Protein 6.2 (*)    GFR, Estimated 53 (*)    All other components within normal limits  BRAIN NATRIURETIC PEPTIDE  TROPONIN I (HIGH SENSITIVITY)  TROPONIN I (HIGH SENSITIVITY)    EKG EKG Interpretation  Date/Time:  Thursday July 02 2020 00:39:02 EDT Ventricular Rate:  100 PR Interval:  156 QRS Duration: 168 QT Interval:  406 QTC Calculation: 523 R Axis:   -82 Text Interpretation: Sinus rhythm with Premature supraventricular complexes Right bundle branch block Left anterior fascicular block  Bifascicular block  Left ventricular hypertrophy with repolarization abnormality ( R in aVL , Romhilt-Estes ) Cannot rule out Septal infarct , age undetermined Abnormal ECG When compared with ECG of 05/29/2018, HEART RATE has increased Premature atrial complexes are now present Confirmed by Delora Fuel ([id number]) on 07/02/2020 12:51:15 AM  Radiology DG Chest 2 View  Result Date: 07/02/2020 CLINICAL DATA:  Chest pain. EXAM: CHEST - 2 VIEW COMPARISON:  Chest x-ray 08/29/2018, CT heart 07/10/2018 FINDINGS: The heart size and mediastinal contours are unchanged. Aortic calcification No focal consolidation. No pulmonary edema. No pleural effusion. No pneumothorax. No acute osseous abnormality. IMPRESSION: No active cardiopulmonary disease. Electronically Signed   By: Iven Finn M.D.   On: 07/02/2020 01:58    Procedures Procedures   Medications Ordered in ED Medications  aspirin chewable tablet 324 mg (324 mg Oral Given 07/02/20 0100)    ED Course  I have reviewed the triage vital signs and the nursing notes.  Pertinent labs & imaging results that were available during my care of the patient were reviewed by me and considered in my medical decision making (see chart for details).    MDM Rules/Calculators/A&P  Will rule out MI.  Otherwise sounds like it could be other GI versus MI but will need to  follow-up with cardiology and/or GI for the same Troponins negative. No CP at this time. Will follow up with cardiology. Message sent.   Final Clinical Impression(s) / ED Diagnoses Final diagnoses:  Nonspecific chest pain    Rx / DC Orders ED Discharge Orders     None        Theresia Pree, Corene Cornea, MD 07/02/20 [phone]

## 2020-07-02 NOTE — ED Triage Notes (Signed)
Patient reports central chest pain this evening with SOB , no emesis or diaphoresis .

## 2020-07-02 NOTE — Telephone Encounter (Signed)
Patient was returning call 

## 2020-07-02 NOTE — Telephone Encounter (Signed)
Left a message to call back.

## 2020-07-02 NOTE — ED Notes (Signed)
Provider at bedside at this time

## 2020-07-03 NOTE — Telephone Encounter (Signed)
Recommend he come in to be seen by [name] ASAP. Inappropriate to wait for my schedule. Let him know I will review all data. May need coronary angio. Needs to be seen first.

## 2020-07-03 NOTE — Telephone Encounter (Signed)
Called patient back. Made patient an appointment with the next available DOD on 07/07/20. Encouraged patient to go to ED if his symptoms become worse before his appointment. Patient verbalized understanding.

## 2020-07-07 ENCOUNTER — Ambulatory Visit (INDEPENDENT_AMBULATORY_CARE_PROVIDER_SITE_OTHER): Payer: 59 | Admitting: Cardiology

## 2020-07-07 ENCOUNTER — Other Ambulatory Visit: Payer: Self-pay

## 2020-07-07 ENCOUNTER — Encounter: Payer: Self-pay | Admitting: Cardiology

## 2020-07-07 VITALS — BP 124/80 | HR 70 | Ht 76.0 in | Wt 215.6 lb

## 2020-07-07 DIAGNOSIS — I25119 Atherosclerotic heart disease of native coronary artery with unspecified angina pectoris: Secondary | ICD-10-CM | POA: Diagnosis not present

## 2020-07-07 NOTE — Patient Instructions (Signed)
Medication Instructions:  Your physician recommends that you continue on your current medications as directed. Please refer to the Current Medication list given to you today.  *If you need a refill on your cardiac medications before your next appointment, please call your pharmacy*   Lab Work: None ordered   Testing/Procedures: None ordered   Follow-Up: At [facility], you and your health needs are our priority.  As part of our continuing mission to provide you with exceptional heart care, we have created designated Provider Care Teams.  These Care Teams include your primary Cardiologist (physician) and Advanced Practice Providers (APPs -  Physician Assistants and Nurse Practitioners) who all work together to provide you with the care you need, when you need it.  We recommend signing up for the patient portal called "MyChart".  Sign up information is provided on this After Visit Summary.  MyChart is used to connect with patients for Virtual Visits (Telemedicine).  Patients are able to view lab/test results, encounter notes, upcoming appointments, etc.  Non-urgent messages can be sent to your provider as well.   To learn more about what you can do with MyChart, go to [email or web address].    Your next appointment:   To be determined (after CT has been completed)   The format for your next appointment:   In Person  Provider:   Daneen Schick, MD    Thank you for choosing [facility]!!   [phone]      Your cardiac CT will be scheduled at one of the below locations:   [facility] [address] [place], [place] [address] [phone]  [place] [address] [place], [place] [address] [phone]  If scheduled at [facility], please arrive at the [facility] main entrance (entrance A) of [facility] 30 minutes prior to test start time. Proceed to the [facility] Radiology  Department (first floor) to check-in and test prep.  If scheduled at [facility], please arrive 15 mins early for check-in and test prep.  Please follow these instructions carefully (unless otherwise directed):  Hold all erectile dysfunction medications at least 3 days (72 hrs) prior to test.  On the Night Before the Test: Be sure to Drink plenty of water. Do not consume any caffeinated/decaffeinated beverages or chocolate 12 hours prior to your test. Do not take any antihistamines 12 hours prior to your test.  On the Day of the Test: Drink plenty of water until 1 hour prior to the test. Do not eat any food 4 hours prior to the test. You may take your regular medications prior to the test.  HOLD Furosemide/Hydrochlorothiazide morning of the test.      After the Test: Drink plenty of water. After receiving IV contrast, you may experience a mild flushed feeling. This is normal. On occasion, you may experience a mild rash up to 24 hours after the test. This is not dangerous. If this occurs, you can take Benadryl 25 mg and increase your fluid intake. If you experience trouble breathing, this can be serious. If it is severe call 911 IMMEDIATELY. If it is mild, please call our office. If you take any of these medications: Glipizide/Metformin, Avandament, Glucavance, please do not take 48 hours after completing test unless otherwise instructed.   Once we have confirmed authorization from your insurance company, we will call you to set up a date and time for your test. Based on how  quickly your insurance processes prior authorizations requests, please allow up to 4 weeks to be contacted for scheduling your Cardiac CT appointment. Be advised that routine Cardiac CT appointments could be scheduled as many as 8 weeks after your provider has ordered it.  For non-scheduling related questions, please contact the cardiac imaging nurse navigator should you have any  questions/concerns: Marchia Bond, Cardiac Imaging Nurse Navigator Gordy Clement, Cardiac Imaging Nurse Navigator [facility] Direct Office Dial: [phone]   For scheduling needs, including cancellations and rescheduling, please call [place], [phone].

## 2020-07-07 NOTE — Progress Notes (Signed)
Electrophysiology Office Note   Date:  07/07/2020   ID:  Cameron, Cardenas 1955-11-23, MRN [id number]  PCP:  Seward Carol, MD  Cardiologist:  Tamala Julian  Chief Complaint: Chest pain   History of Present Illness: Cameron Cardenas is a 65 y.o. male who is being seen today for the evaluation of chest pain at the request of Seward Carol, MD. Presenting today for electrophysiology evaluation.  He has a history significant for hyperlipidemia, nonobstructive coronary artery disease, type 2 diabetes, and hypertension.  He presented to the emergency room 07/02/2020 with chest pain.  Pain was constant and associated with shortness of breath.  He ate a meal which somewhat improved his pain.  He had troponins which were negative.  He was told to follow-up in cardiology clinic.  His pain occurred when he was carrying approximately 15 pounds of groceries into his house.  It lasted through his ER visit.  It was improved after taking 4 aspirin.  He has not had any further episodes of chest pain since that time.  Today, he denies symptoms of palpitations, chest pain, shortness of breath, orthopnea, PND, lower extremity edema, claudication, dizziness, presyncope, syncope, bleeding, or neurologic sequela. The patient is tolerating medications without difficulties.    Past Medical History:  Diagnosis Date   Allergic rhinitis due to pollen    Anal fissure    Bilateral sciatica 07/09/2018   Chronic pain syndrome 07/09/2018   Chronic, continuous use of opioids 07/09/2018   DDD (degenerative disc disease), lumbar    Essential hypertension, malignant    Hemorrhoids    Lumbago    Lumbar post-laminectomy syndrome 07/09/2018   Plantar fasciitis of left foot    Pure hypercholesterolemia    Spinal stenosis of lumbar region 07/09/2018   Type II or unspecified type diabetes mellitus without mention of complication, not stated as uncontrolled    dx in 2005   Unspecified vitamin D deficiency    Past Surgical  History:  Procedure Laterality Date   COLONOSCOPY     in [facility], MD no longer in practice, does not recall the name of facility. Does belive polyps were removed   [place] [facility]  2011   total of 3 sx on back per pt   MAXIMUM ACCESS (MAS)POSTERIOR LUMBAR INTERBODY FUSION (PLIF) 1 LEVEL N/A 06/10/2014   Procedure: Redo Lumbar Five-Sacral One Decompression with maximum access posterior lumbar interbody fusion;  Surgeon: Erline Levine, MD;  Location: [place] [facility];  Service: Neurosurgery;  Laterality: N/A;  Redo Decompression with maximum access posterior lumbar interbody fusion, L5-S1     Current Outpatient Medications  Medication Sig Dispense Refill   aspirin 81 MG tablet Take 81 mg by mouth daily.     atorvastatin (LIPITOR) 80 MG tablet daily in the afternoon.     Blood Glucose Monitoring Suppl (ONETOUCH VERIO) w/Device KIT Use to check blood sugars once daily 1 kit 0   Dapagliflozin-metFORMIN HCl ER (XIGDUO XR) 05-998 MG TB24 Take 1 tablet by mouth daily. 30 tablet 2   econazole nitrate 1 % cream Apply topically 2 (two) times daily.     ezetimibe (ZETIA) 10 MG tablet Take 1 tablet by mouth once daily 90 tablet 1   fluticasone (FLONASE) 50 MCG/ACT nasal spray USE 2 SPRAY(S) IN EACH NOSTRIL ONCE DAILY     furosemide (LASIX) 20 MG tablet Take 1 tablet (20 mg total) by mouth daily as needed. 90 tablet 3   glucose blood test strip 1 each by Other  route as needed for other. One Touch Verio- Use to test blood sugars once daily 100 each 0   ibuprofen (ADVIL) 600 MG tablet Take 600 mg by mouth as needed.     isosorbide mononitrate (IMDUR) 30 MG 24 hr tablet Take 0.5 tablets (15 mg total) by mouth daily. 45 tablet 3   ketoconazole (NIZORAL) 2 % cream as needed.     meloxicam (MOBIC) 7.5 MG tablet Take 1 tablet (7.5 mg total) by mouth daily. 30 tablet 5   olmesartan-hydrochlorothiazide (BENICAR HCT) 20-12.5 MG tablet Take 1 tablet by mouth daily. Take 1 tablet by mouth once daily. (Patient not taking:  Reported on 06/29/2020)     OneTouch Delica Lancets 40J MISC Use to test blood sugars once daily 100 each 0   oxyCODONE-acetaminophen (PERCOCET) 10-325 MG tablet Take 1 tablet by mouth every 6 (six) hours as needed for pain. 120 tablet 0   [START ON 07/31/2020] oxyCODONE-acetaminophen (PERCOCET) 10-325 MG tablet Take 1 tablet by mouth every 6 (six) hours as needed for pain. 120 tablet 0   sildenafil (REVATIO) 20 MG tablet Take 2-3 tablets by mouth as needed, as directed Discontinue if having headaches. (Patient not taking: Reported on 06/29/2020) 30 tablet 1   triamcinolone cream (KENALOG) 0.5 % as needed.     No current facility-administered medications for this visit.    Allergies:   Patient has no known allergies.   Social History:  The patient  reports that he quit smoking about 17 months ago. His smoking use included cigars. He has never used smokeless tobacco. He reports previous alcohol use. He reports previous drug use. Drug: Marijuana.   Family History:  The patient's family history includes Cataracts in his sister; Diabetes in his mother and sister; Hypertension in his father and mother; Kidney disease in his mother; Seizures in his brother.    ROS:  Please see the history of present illness.   Otherwise, review of systems is positive for none.   All other systems are reviewed and negative.    PHYSICAL EXAM: VS:  There were no vitals taken for this visit. , BMI There is no height or weight on file to calculate BMI. GEN: Well nourished, well developed, in no acute distress  HEENT: normal  Neck: no JVD, carotid bruits, or masses Cardiac: RRR; no murmurs, rubs, or gallops,no edema  Respiratory:  clear to auscultation bilaterally, normal work of breathing GI: soft, nontender, nondistended, + BS MS: no deformity or atrophy  Skin: warm and dry Neuro:  Strength and sensation are intact Psych: euthymic mood, full affect  EKG:  EKG is not ordered today. Personal review of the ekg  ordered 07/02/20 shows sinus rhythm, right bundle branch block, left anterior fascicular block, rate 100  Recent Labs: 07/02/2020: ALT 27; B Natriuretic Peptide 21.4; BUN 17; Creatinine, Ser 1.46; Hemoglobin 15.2; Platelets 131; Potassium 3.5; Sodium 137    Lipid Panel     Component Value Date/Time   CHOL 169 03/17/2020 0733   TRIG 59.0 03/17/2020 0733   HDL 90.10 03/17/2020 0733   CHOLHDL 2 03/17/2020 0733   VLDL 11.8 03/17/2020 0733   LDLCALC 67 03/17/2020 0733     Wt Readings from Last 3 Encounters:  07/02/20 235 lb 14.3 oz (107 kg)  06/29/20 213 lb 12.8 oz (97 kg)  04/10/20 215 lb 6 oz (97.7 kg)      Other studies Reviewed: Additional studies/ records that were reviewed today include: Coronary CTA 07/10/2018 Review of the above  records today demonstrates:  1. Coronary calcium score of 14. This was 54 percentile for age and sex matched control.   2. Normal coronary origin with right dominance.   3. Minimal non-obstructive CAD. Continue with risk factor modifications.   ASSESSMENT AND PLAN:  1.  Nonobstructive coronary artery disease: Has had multiple episodes of chest pain.  Coronary disease found nonobstructive on coronary CTA in 2020.  He did come into the hospital with chest pain for a couple hours that improved with aspirin.  His troponins were negative.  I discussed this with his primary cardiologist.  We both feel that coronary CT is reasonable, as he did not have evidence of coronary artery disease 2 years ago.  2.  Hypertension: Currently well controlled  3.  Hyperlipidemia: Continue statin per primary cardiology.    Current medicines are reviewed at length with the patient today.   The patient does not have concerns regarding his medicines.  The following changes were made today:  none  Labs/ tests ordered today include:  No orders of the defined types were placed in this encounter.    Disposition:   FU with Pernell Dupre post CT  Signed, Hannie Shoe Meredith Leeds, MD  07/07/2020 7:44 AM     [facility] [address] [place]  [address] [phone] (office) [phone] (fax)

## 2020-07-08 ENCOUNTER — Telehealth (HOSPITAL_COMMUNITY): Payer: Self-pay | Admitting: Emergency Medicine

## 2020-07-08 ENCOUNTER — Other Ambulatory Visit (HOSPITAL_COMMUNITY): Payer: Self-pay | Admitting: Emergency Medicine

## 2020-07-08 DIAGNOSIS — I25119 Atherosclerotic heart disease of native coronary artery with unspecified angina pectoris: Secondary | ICD-10-CM

## 2020-07-08 MED ORDER — METOPROLOL TARTRATE 100 MG PO TABS: 100.0000 mg | ORAL_TABLET | Freq: Once | ORAL | 0 refills | Status: DC

## 2020-07-08 NOTE — Telephone Encounter (Signed)
Reaching out to patient to offer assistance regarding upcoming cardiac imaging study; pt verbalizes understanding of appt date/time, parking situation and where to check in, pre-test NPO status and medications ordered, and verified current allergies; name and call back number provided for further questions should they arise Cameron Bond RN Navigator Cardiac Imaging [facility] [phone] office [phone] cell  100mg  metoprolol tart 2 hr prior to scan Holding fluid pills and ED medication Cameron Cardenas

## 2020-07-08 NOTE — Telephone Encounter (Signed)
Attempted to call patient regarding upcoming cardiac CT appointment. Left message on voicemail with name and callback number Marchia Bond RN Navigator Cardiac [facility] [phone] Office [phone] Cell  Detailed message about one time dose 100mg  metoprolol tartrate to be taken 2 hr prior to scan sent to pharmacy on file - [facility] [address] Cameron Cardenas

## 2020-07-09 ENCOUNTER — Encounter (HOSPITAL_COMMUNITY): Payer: Self-pay

## 2020-07-09 ENCOUNTER — Other Ambulatory Visit: Payer: Self-pay

## 2020-07-09 ENCOUNTER — Ambulatory Visit (HOSPITAL_COMMUNITY)
Admission: RE | Admit: 2020-07-09 | Discharge: 2020-07-09 | Disposition: A | Discharge status: Home / Self Care | Payer: 59 | Source: Ambulatory Visit | Attending: Cardiology | Admitting: Cardiology

## 2020-07-09 DIAGNOSIS — I208 Other forms of angina pectoris: Secondary | ICD-10-CM | POA: Insufficient documentation

## 2020-07-09 MED ORDER — NITROGLYCERIN 0.4 MG SL SUBL
SUBLINGUAL_TABLET | SUBLINGUAL | Status: AC
  Filled 2020-07-09: qty 2

## 2020-07-09 MED ORDER — NITROGLYCERIN 0.4 MG SL SUBL
0.8000 mg | SUBLINGUAL_TABLET | Freq: Once | SUBLINGUAL | Status: AC
  Administered 2020-07-09: 0.8 mg via SUBLINGUAL

## 2020-07-09 MED ORDER — IOHEXOL 350 MG/ML SOLN
95.0000 mL | Freq: Once | INTRAVENOUS | Status: AC | PRN
  Administered 2020-07-09: 95 mL via INTRAVENOUS

## 2020-07-10 ENCOUNTER — Telehealth: Payer: Self-pay | Admitting: Interventional Cardiology

## 2020-07-10 ENCOUNTER — Ambulatory Visit (HOSPITAL_COMMUNITY): Payer: 59

## 2020-07-10 NOTE — Telephone Encounter (Signed)
Test has not been reviewed Will call pt once reviewed ./cy

## 2020-07-10 NOTE — Telephone Encounter (Signed)
New Message:    Pt wants only Dr Tamala Julian if possible please to call and discuss his CT results.

## 2020-07-20 NOTE — Telephone Encounter (Signed)
Patient called again this morning to ask about CT results. Patient was upsed that he had to wait this long to get his test results back.

## 2020-07-20 NOTE — Telephone Encounter (Signed)
Cameron Cardenas- can you look at his Coronary CT?  He's called a couple of times about results.

## 2020-07-22 NOTE — Telephone Encounter (Signed)
Called pt to update him that message has been sent to Dr. Tamala Julian and Sharee Pimple and as soon as one of them reviews the CT, we will be in touch with results.

## 2020-07-22 NOTE — Telephone Encounter (Signed)
Left message to call back  

## 2020-07-22 NOTE — Telephone Encounter (Signed)
Let the patient know that I personally reviewed the coronary CTA that was ordered by Dr. Curt Bears.  The results never came back to me for assessment.  The coronary CT looks very similar to the last study from 2020.  Coronary calcium score is 17 which is an excellent prognostic indicator of low risk.  Furthermore the CTA did not demonstrate any significant blockage whatsoever.  Given the negative tests results over time, it seems unlikely that the chest pain is related to obstructive coronary disease.  This is very reassuring.  Let me know if there are further concerns.

## 2020-07-23 NOTE — Telephone Encounter (Signed)
Pt returning phone call... please advise  

## 2020-07-23 NOTE — Telephone Encounter (Signed)
Pt is requesting that first available nurse return his call if possible since Ms. Harlow Mares is out of the office.

## 2020-07-24 NOTE — Telephone Encounter (Signed)
Spoke with pt and made him aware of results and information from Dr. Tamala Julian.  Pt appreciative for call.

## 2020-08-05 ENCOUNTER — Encounter: Payer: Self-pay | Admitting: Anesthesiology

## 2020-08-05 ENCOUNTER — Ambulatory Visit: Payer: 59 | Attending: Anesthesiology | Admitting: Anesthesiology

## 2020-08-05 ENCOUNTER — Other Ambulatory Visit: Payer: Self-pay

## 2020-08-05 DIAGNOSIS — M961 Postlaminectomy syndrome, not elsewhere classified: Secondary | ICD-10-CM

## 2020-08-05 DIAGNOSIS — M5431 Sciatica, right side: Secondary | ICD-10-CM | POA: Diagnosis not present

## 2020-08-05 DIAGNOSIS — M48062 Spinal stenosis, lumbar region with neurogenic claudication: Secondary | ICD-10-CM

## 2020-08-05 DIAGNOSIS — G894 Chronic pain syndrome: Secondary | ICD-10-CM | POA: Diagnosis not present

## 2020-08-05 DIAGNOSIS — F1190 Opioid use, unspecified, uncomplicated: Secondary | ICD-10-CM

## 2020-08-05 DIAGNOSIS — M5432 Sciatica, left side: Secondary | ICD-10-CM

## 2020-08-05 DIAGNOSIS — M5136 Other intervertebral disc degeneration, lumbar region: Secondary | ICD-10-CM

## 2020-08-05 DIAGNOSIS — M51369 Other intervertebral disc degeneration, lumbar region without mention of lumbar back pain or lower extremity pain: Secondary | ICD-10-CM

## 2020-08-05 DIAGNOSIS — M5126 Other intervertebral disc displacement, lumbar region: Secondary | ICD-10-CM

## 2020-08-05 MED ORDER — OXYCODONE-ACETAMINOPHEN 10-325 MG PO TABS: 1.0000 | ORAL_TABLET | Freq: Four times a day (QID) | ORAL | 0 refills | Status: DC | PRN

## 2020-08-05 MED ORDER — OXYCODONE-ACETAMINOPHEN 10-325 MG PO TABS: 1.0000 | ORAL_TABLET | Freq: Four times a day (QID) | ORAL | 0 refills | Status: AC | PRN

## 2020-08-05 NOTE — Progress Notes (Signed)
Virtual Visit via Telephone Note  I connected with [name] on 08/05/20 at  2:00 PM EDT by telephone and verified that I am speaking with the correct person using two identifiers.  Location: Patient: Home Provider: Pain control center   I discussed the limitations, risks, security and privacy concerns of performing an evaluation and management service by telephone and the availability of in person appointments. I also discussed with the patient that there may be a patient responsible charge related to this service. The patient expressed understanding and agreed to proceed.   History of Present Illness: I spoke with Cameron Cardenas today via telephone as we were unable to link for the video portion of virtual conference.  He reports that he has been doing reasonably well but limited secondary to the significant heat.  He has not been as active and not walking regularly.  The quality characteristic and distribution of his low back pain and leg pain are stable with no changes recently.  He still taking his Percocet 10 mg tablet 4 times a day and this continues to give him good relief and keep his pain under reasonable control rated about 75% improvement for about 4 to 6 hours when he takes it.  Otherwise he is in good shape he states.  No other changes to lower extremity strength or function or bowel or bladder function are noted at this time. Review of systems: General: No fevers or chills Pulmonary: No shortness of breath or dyspnea Cardiac: No angina or palpitations or lightheadedness GI: No abdominal pain or constipation Psych: No depression     Observations/Objective:   Current Outpatient Medications:    [START ON 09/29/2020] oxyCODONE-acetaminophen (PERCOCET) 10-325 MG tablet, Take 1 tablet by mouth every 6 (six) hours as needed for pain., Disp: 120 tablet, Rfl: 0   amLODipine (NORVASC) 2.5 MG tablet, Take 2.5 mg by mouth daily., Disp: , Rfl:    aspirin 81 MG tablet, Take 81 mg by  mouth daily., Disp: , Rfl:    atorvastatin (LIPITOR) 80 MG tablet, daily in the afternoon., Disp: , Rfl:    Blood Glucose Monitoring Suppl (ONETOUCH VERIO) w/Device KIT, Use to check blood sugars once daily, Disp: 1 kit, Rfl: 0   Dapagliflozin-metFORMIN HCl ER (XIGDUO XR) 05-998 MG TB24, Take 1 tablet by mouth daily., Disp: 30 tablet, Rfl: 2   econazole nitrate 1 % cream, Apply topically 2 (two) times daily., Disp: , Rfl:    ezetimibe (ZETIA) 10 MG tablet, Take 1 tablet by mouth once daily, Disp: 90 tablet, Rfl: 1   fluticasone (FLONASE) 50 MCG/ACT nasal spray, USE 2 SPRAY(S) IN EACH NOSTRIL ONCE DAILY, Disp: , Rfl:    furosemide (LASIX) 20 MG tablet, Take 1 tablet (20 mg total) by mouth daily as needed., Disp: 90 tablet, Rfl: 3   glucose blood test strip, 1 each by Other route as needed for other. One Touch Verio- Use to test blood sugars once daily, Disp: 100 each, Rfl: 0   ibuprofen (ADVIL) 600 MG tablet, Take 600 mg by mouth as needed., Disp: , Rfl:    isosorbide mononitrate (IMDUR) 30 MG 24 hr tablet, Take 0.5 tablets (15 mg total) by mouth daily., Disp: 45 tablet, Rfl: 3   ketoconazole (NIZORAL) 2 % cream, as needed., Disp: , Rfl:    meloxicam (MOBIC) 7.5 MG tablet, Take 1 tablet (7.5 mg total) by mouth daily., Disp: 30 tablet, Rfl: 5   metoprolol tartrate (LOPRESSOR) 100 MG tablet, Take 1 tablet (100  mg total) by mouth once for 1 dose. Please take one time dose 118m metoprolol tartrate 2 hr prior to cardiac CT for HR control IF HR >55bpm., Disp: 1 tablet, Rfl: 0   olmesartan-hydrochlorothiazide (BENICAR HCT) 20-12.5 MG tablet, Take 1 tablet by mouth daily. Take 1 tablet by mouth once daily. (Patient not taking: Reported on 06/29/2020), Disp: , Rfl:    OneTouch Delica Lancets 309FMISC, Use to test blood sugars once daily, Disp: 100 each, Rfl: 0   [START ON 08/29/2020] oxyCODONE-acetaminophen (PERCOCET) 10-325 MG tablet, Take 1 tablet by mouth every 6 (six) hours as needed for pain., Disp: 120  tablet, Rfl: 0   sildenafil (REVATIO) 20 MG tablet, Take 2-3 tablets by mouth as needed, as directed Discontinue if having headaches. (Patient not taking: Reported on 06/29/2020), Disp: 30 tablet, Rfl: 1   triamcinolone cream (KENALOG) 0.5 %, as needed., Disp: , Rfl:   Assessment and Plan:  1. Spinal stenosis of lumbar region with neurogenic claudication   2. Chronic, continuous use of opioids   3. Chronic pain syndrome   4. Bilateral sciatica   5. Lumbar post-laminectomy syndrome   6. DDD (degenerative disc disease), lumbar   7. Failed back syndrome of lumbar spine   8. Herniated lumbar intervertebral disc   Based on her discussion today it appears appropriate for refill of his current Percocet medication with no other changes in regimen.  [facility] and it is appropriate.  There is a be dated for August 21 and September 20.  We will schedule him for a 236-montheturn to clinic for routine evaluation.  He is to continue with his primary care physician for baseline medical care. Follow Up Instructions:    I discussed the assessment and treatment plan with the patient. The patient was provided an opportunity to ask questions and all were answered. The patient agreed with the plan and demonstrated an understanding of the instructions.   The patient was advised to call back or seek an in-person evaluation if the symptoms worsen or if the condition fails to improve as anticipated.  I provided 30 minutes of non-face-to-face time during this encounter.   JaMolli BarrowsMD

## 2020-08-31 ENCOUNTER — Ambulatory Visit: Payer: 59 | Admitting: Podiatry

## 2020-09-03 ENCOUNTER — Ambulatory Visit: Payer: 59 | Admitting: Podiatry

## 2020-10-08 ENCOUNTER — Encounter: Payer: Self-pay | Admitting: Anesthesiology

## 2020-10-08 ENCOUNTER — Other Ambulatory Visit: Payer: Self-pay

## 2020-10-08 ENCOUNTER — Ambulatory Visit: Payer: 59 | Attending: Anesthesiology | Admitting: Anesthesiology

## 2020-10-08 ENCOUNTER — Ambulatory Visit: Payer: 59 | Admitting: Podiatry

## 2020-10-08 DIAGNOSIS — F1190 Opioid use, unspecified, uncomplicated: Secondary | ICD-10-CM | POA: Diagnosis not present

## 2020-10-08 DIAGNOSIS — G894 Chronic pain syndrome: Secondary | ICD-10-CM

## 2020-10-08 DIAGNOSIS — M5136 Other intervertebral disc degeneration, lumbar region: Secondary | ICD-10-CM

## 2020-10-08 DIAGNOSIS — M48062 Spinal stenosis, lumbar region with neurogenic claudication: Secondary | ICD-10-CM

## 2020-10-08 DIAGNOSIS — M5431 Sciatica, right side: Secondary | ICD-10-CM | POA: Diagnosis not present

## 2020-10-08 DIAGNOSIS — M5432 Sciatica, left side: Secondary | ICD-10-CM

## 2020-10-08 DIAGNOSIS — M961 Postlaminectomy syndrome, not elsewhere classified: Secondary | ICD-10-CM

## 2020-10-08 MED ORDER — OXYCODONE-ACETAMINOPHEN 10-325 MG PO TABS: 1.0000 | ORAL_TABLET | Freq: Four times a day (QID) | ORAL | 0 refills | Status: AC | PRN

## 2020-10-08 NOTE — Progress Notes (Signed)
Virtual Visit via Telephone Note  I connected with [name] on 10/08/20 at  1:20 PM EDT by telephone and verified that I am speaking with the correct person using two identifiers.  Location: Patient: Home Provider: [facility] control center   I discussed the limitations, risks, security and privacy concerns of performing an evaluation and management service by telephone and the availability of in person appointments. I also discussed with the patient that there may be a patient responsible charge related to this service. The patient expressed understanding and agreed to proceed.   History of Present Illness: I spoke with Haskel Khan today via telephone as we were unable to link for the video portion of the virtual conference but he reports that he is doing reasonably well with his low back pain and leg pain.  No recent changes in quality characteristic or distribution of his pain complex are noted.  His strength is been stable and he has been walking as best he can.  He is taking his medications 4 times a day Percocet 10/13/2023 and doing well with this.  He continues to derive good functional lifestyle improvement with this course failed more conservative therapy.  He gets approximately 75% improvement in his back pain and leg pain for about 4 to 6 hours before he has to take an additional tablet.  Otherwise he is in his usual state of health.  Quality characteristic and distribution of this pain is been stable and otherwise no changes are reported.  Review of systems: General: No fevers or chills Pulmonary: No shortness of breath or dyspnea Cardiac: No angina or palpitations or lightheadedness GI: No abdominal pain or constipation Psych: No depression    Observations/Objective:  Current Outpatient Medications:    [START ON 12/01/2020] oxyCODONE-acetaminophen (PERCOCET) 10-325 MG tablet, Take 1 tablet by mouth every 6 (six) hours as needed for pain., Disp: 120 tablet, Rfl: 0    amLODipine (NORVASC) 2.5 MG tablet, Take 2.5 mg by mouth daily., Disp: , Rfl:    aspirin 81 MG tablet, Take 81 mg by mouth daily., Disp: , Rfl:    atorvastatin (LIPITOR) 80 MG tablet, daily in the afternoon., Disp: , Rfl:    Blood Glucose Monitoring Suppl (ONETOUCH VERIO) w/Device KIT, Use to check blood sugars once daily, Disp: 1 kit, Rfl: 0   Dapagliflozin-metFORMIN HCl ER (XIGDUO XR) 05-998 MG TB24, Take 1 tablet by mouth daily., Disp: 30 tablet, Rfl: 2   econazole nitrate 1 % cream, Apply topically 2 (two) times daily., Disp: , Rfl:    ezetimibe (ZETIA) 10 MG tablet, Take 1 tablet by mouth once daily, Disp: 90 tablet, Rfl: 1   fluticasone (FLONASE) 50 MCG/ACT nasal spray, USE 2 SPRAY(S) IN EACH NOSTRIL ONCE DAILY, Disp: , Rfl:    furosemide (LASIX) 20 MG tablet, Take 1 tablet (20 mg total) by mouth daily as needed., Disp: 90 tablet, Rfl: 3   glucose blood test strip, 1 each by Other route as needed for other. One Touch Verio- Use to test blood sugars once daily, Disp: 100 each, Rfl: 0   ibuprofen (ADVIL) 600 MG tablet, Take 600 mg by mouth as needed., Disp: , Rfl:    isosorbide mononitrate (IMDUR) 30 MG 24 hr tablet, Take 0.5 tablets (15 mg total) by mouth daily., Disp: 45 tablet, Rfl: 3   ketoconazole (NIZORAL) 2 % cream, as needed., Disp: , Rfl:    meloxicam (MOBIC) 7.5 MG tablet, Take 1 tablet (7.5 mg total) by mouth daily., Disp: 30  tablet, Rfl: 5   metoprolol tartrate (LOPRESSOR) 100 MG tablet, Take 1 tablet (100 mg total) by mouth once for 1 dose. Please take one time dose $RemoveB'100mg'RwkuKrLe$  metoprolol tartrate 2 hr prior to cardiac CT for HR control IF HR >55bpm., Disp: 1 tablet, Rfl: 0   olmesartan-hydrochlorothiazide (BENICAR HCT) 20-12.5 MG tablet, Take 1 tablet by mouth daily. Take 1 tablet by mouth once daily. (Patient not taking: Reported on 06/29/2020), Disp: , Rfl:    OneTouch Delica Lancets 15Q MISC, Use to test blood sugars once daily, Disp: 100 each, Rfl: 0   [START ON 11/01/2020]  oxyCODONE-acetaminophen (PERCOCET) 10-325 MG tablet, Take 1 tablet by mouth every 6 (six) hours as needed for pain., Disp: 120 tablet, Rfl: 0   sildenafil (REVATIO) 20 MG tablet, Take 2-3 tablets by mouth as needed, as directed Discontinue if having headaches. (Patient not taking: Reported on 06/29/2020), Disp: 30 tablet, Rfl: 1   triamcinolone cream (KENALOG) 0.5 %, as needed., Disp: , Rfl:    Assessment and Plan: 1. Spinal stenosis of lumbar region with neurogenic claudication   2. Chronic, continuous use of opioids   3. Chronic pain syndrome   4. Bilateral sciatica   5. Lumbar post-laminectomy syndrome   6. DDD (degenerative disc disease), lumbar   7. Failed back syndrome of lumbar spine    Based on our discussion today he gets appropriate for refills of his medication.  This will be for October 20 3 November 22nd.  We will schedule him for follow-up in approximately 2 months.  No other changes in his pain management regimen will be initiated today.  I encouraged him to continue follow-up with his primary care physicians for baseline medical care.  Follow Up Instructions:    I discussed the assessment and treatment plan with the patient. The patient was provided an opportunity to ask questions and all were answered. The patient agreed with the plan and demonstrated an understanding of the instructions.   The patient was advised to call back or seek an in-person evaluation if the symptoms worsen or if the condition fails to improve as anticipated.  I provided 30 minutes of non-face-to-face time during this encounter.   Molli Barrows, MD

## 2020-10-16 ENCOUNTER — Encounter: Payer: Self-pay | Admitting: Podiatry

## 2020-10-16 ENCOUNTER — Ambulatory Visit: Payer: 59 | Admitting: Podiatry

## 2020-10-19 ENCOUNTER — Other Ambulatory Visit: Payer: 59

## 2020-10-26 ENCOUNTER — Ambulatory Visit: Payer: 59 | Admitting: Endocrinology

## 2020-11-17 ENCOUNTER — Ambulatory Visit: Payer: 59 | Admitting: Family

## 2020-11-17 ENCOUNTER — Telehealth: Payer: Self-pay | Admitting: Internal Medicine

## 2020-11-17 NOTE — Telephone Encounter (Signed)
Inbound call from pt requesting a call back stating that he suppose to have a colon every 5 yrs. According to the recall its 2029. Please advise. Thank you.

## 2020-11-18 ENCOUNTER — Ambulatory Visit (INDEPENDENT_AMBULATORY_CARE_PROVIDER_SITE_OTHER): Payer: 59 | Admitting: Sports Medicine

## 2020-11-18 ENCOUNTER — Telehealth: Payer: Self-pay | Admitting: Endocrinology

## 2020-11-18 VITALS — BP 132/84 | Ht 76.0 in | Wt 210.0 lb

## 2020-11-18 DIAGNOSIS — M722 Plantar fascial fibromatosis: Secondary | ICD-10-CM | POA: Diagnosis not present

## 2020-11-18 DIAGNOSIS — M2142 Flat foot [pes planus] (acquired), left foot: Secondary | ICD-10-CM | POA: Diagnosis not present

## 2020-11-18 DIAGNOSIS — M2141 Flat foot [pes planus] (acquired), right foot: Secondary | ICD-10-CM | POA: Diagnosis not present

## 2020-11-18 NOTE — Telephone Encounter (Signed)
Patient requests to be called at ph# [phone] re: Patient states he has sugar in his urine (urine test at Dr. Lina Sar office on 11/16/20)

## 2020-11-18 NOTE — Patient Instructions (Signed)
It was great to meet you today, thank you for letting me participate in your care!  Today, we discussed your heel pain, this is most indicative of Plantar Fasciitis.  Things to do for your pain:  - Perform daily exercises and stretches at least 1-2x daily - Strassburg Sock ([employer]) or [employer] Sock ([employer]) -> wear this at night - May try arch strap - Apply ice or frozen waterbottle over painful area 3 times a day - May take Meloxicam as needed  You will follow-up in about 5-6 weeks.  If you have any further questions, please give the clinic a call [phone].  Cheers,  Elba Barman, [place]

## 2020-11-18 NOTE — Progress Notes (Signed)
PCP: Seward Carol, MD  Subjective:   HPI: Patient is a 65 y.o. male here for right posterior heel pain.  Cameron Cardenas has had right plantar heel pain for a few years now that comes and goes. Here over the past 3 months his pain has become more constant and painful. He states pain is on the bottom of his heel, denies any radiation up into the calf or into the toes. His pain is worse with first steps in the morning or when he gets out of bed in the nighttime to go to the bathroom. Describes it as very sharp pain like walking on needles. His pain does get slightly better as he moves it, but worsens throughout the day if he has prolonged standing or ambulating. He does taken Mobic and occasionally Percocet that he uses for his chronic LBP but this only helps his heel pain minimally. He denies any redness, swelling, or warmth to the area.  Past Medical History:  Diagnosis Date   Allergic rhinitis due to pollen    Anal fissure    Bilateral sciatica 07/09/2018   Chronic pain syndrome 07/09/2018   Chronic, continuous use of opioids 07/09/2018   DDD (degenerative disc disease), lumbar    Essential hypertension, malignant    Hemorrhoids    Lumbago    Lumbar post-laminectomy syndrome 07/09/2018   Plantar fasciitis of left foot    Pure hypercholesterolemia    Spinal stenosis of lumbar region 07/09/2018   Type II or unspecified type diabetes mellitus without mention of complication, not stated as uncontrolled    dx in 2005   Unspecified vitamin D deficiency     Current Outpatient Medications on File Prior to Visit  Medication Sig Dispense Refill   amLODipine (NORVASC) 2.5 MG tablet Take 2.5 mg by mouth daily.     aspirin 81 MG tablet Take 81 mg by mouth daily.     atorvastatin (LIPITOR) 80 MG tablet daily in the afternoon.     Blood Glucose Monitoring Suppl (ONETOUCH VERIO) w/Device KIT Use to check blood sugars once daily 1 kit 0   Dapagliflozin-metFORMIN HCl ER (XIGDUO XR) 05-998 MG TB24 Take 1 tablet  by mouth daily. 30 tablet 2   econazole nitrate 1 % cream Apply topically 2 (two) times daily.     ezetimibe (ZETIA) 10 MG tablet Take 1 tablet by mouth once daily 90 tablet 1   fluticasone (FLONASE) 50 MCG/ACT nasal spray USE 2 SPRAY(S) IN EACH NOSTRIL ONCE DAILY     furosemide (LASIX) 20 MG tablet Take 1 tablet (20 mg total) by mouth daily as needed. 90 tablet 3   glucose blood test strip 1 each by Other route as needed for other. One Touch Verio- Use to test blood sugars once daily 100 each 0   ibuprofen (ADVIL) 600 MG tablet Take 600 mg by mouth as needed.     isosorbide mononitrate (IMDUR) 30 MG 24 hr tablet Take 0.5 tablets (15 mg total) by mouth daily. 45 tablet 3   ketoconazole (NIZORAL) 2 % cream as needed.     meloxicam (MOBIC) 7.5 MG tablet Take 1 tablet (7.5 mg total) by mouth daily. 30 tablet 5   metoprolol tartrate (LOPRESSOR) 100 MG tablet Take 1 tablet (100 mg total) by mouth once for 1 dose. Please take one time dose 139m metoprolol tartrate 2 hr prior to cardiac CT for HR control IF HR >55bpm. 1 tablet 0   olmesartan-hydrochlorothiazide (BENICAR HCT) 20-12.5 MG tablet Take 1 tablet by  mouth daily. Take 1 tablet by mouth once daily. (Patient not taking: Reported on 06/29/2020)     OneTouch Delica Lancets 62E MISC Use to test blood sugars once daily 100 each 0   oxyCODONE-acetaminophen (PERCOCET) 10-325 MG tablet Take 1 tablet by mouth every 6 (six) hours as needed for pain. 120 tablet 0   [START ON 12/01/2020] oxyCODONE-acetaminophen (PERCOCET) 10-325 MG tablet Take 1 tablet by mouth every 6 (six) hours as needed for pain. 120 tablet 0   sildenafil (REVATIO) 20 MG tablet Take 2-3 tablets by mouth as needed, as directed Discontinue if having headaches. (Patient not taking: Reported on 06/29/2020) 30 tablet 1   triamcinolone cream (KENALOG) 0.5 % as needed.     No current facility-administered medications on file prior to visit.    Past Surgical History:  Procedure Laterality Date    COLONOSCOPY     in [facility], MD no longer in practice, does not recall the name of facility. Does belive polyps were removed   [place] SURGERY  2011   total of 3 sx on back per pt   MAXIMUM ACCESS (MAS)POSTERIOR LUMBAR INTERBODY FUSION (PLIF) 1 LEVEL N/A 06/10/2014   Procedure: Redo Lumbar Five-Sacral One Decompression with maximum access posterior lumbar interbody fusion;  Surgeon: Erline Levine, MD;  Location: [place] [facility];  Service: Neurosurgery;  Laterality: N/A;  Redo Decompression with maximum access posterior lumbar interbody fusion, L5-S1    Allergies  Allergen Reactions   Losartan Potassium-Hctz Other (See Comments)    BP 132/84   Ht _0  (1.93 m)   Wt 210 lb (95.3 kg)   BMI 25.56 kg/m   [facility] 11/18/2020  Frequency of aerobic exercise (# of days/week) 2  Average time in minutes 20  Frequency of strengthening activities (# of days/week) 2    No flowsheet data found.      Objective:  Physical Exam:  Gen: Well-appearing, in no acute distress; non-toxic CV: Regular Rate. Well-perfused. Warm.  Resp: Breathing unlabored on room air; no wheezing. Psych: Fluid speech in conversation; appropriate affect; normal thought process Neuro: Sensation intact throughout. No gross coordination deficits.  MSK:   - Left foot/heel: + TTP over plantar and posteromedial of calcaneus. No TTP of achilles tendon, talus, navicular, or metatarsals. Negative calcaneal squeeze. No erythema, swelling, or ecchymosis noted. Significantly diminished dorsiflexion with tight achilles tendon. 5/5 strength throughout. Neurovascularly intact distally. Pes planus b/l, loss of longitudinal and transverse arch b/l R > L, + too many toes sign (R).   Gait analysis: bilateral pes planus. Mild hindfoot valgus R > L. Overpronation of b/l feet through stance phase with mild tibial ER.     Assessment & Plan:  1. Plantar fasciitis, left - chronic in nature. 2. Pes planus  bilateral  - HEP provided and demonstrated (plantar fascia, achilles, and calf exercises) - bilateral arch straps provided, wear during day - recommend Str[place] sock nightly - ice massage directly on the plantar fascia 3-4x daily - may continue Meloxicam as needed - f/u in 4-6 weeks - if not improving, may consider custom inserts at next visit to correct hyperpronation and flat feet  Elba Barman, DO PGY-4, Sports Medicine Fellow [place]  Addendum:  I was the preceptor for this visit and available for immediate consultation.  Karlton Lemon MD [place] Boys

## 2020-11-18 NOTE — Telephone Encounter (Signed)
Pt was notified from the letter created by  Dr. Carlean Purl by 11/12/2017. Letter states that recall is for 10 years. Pt verbalized understanding with all questions answered.

## 2020-11-24 ENCOUNTER — Ambulatory Visit: Payer: 59 | Admitting: Orthopaedic Surgery

## 2020-11-30 ENCOUNTER — Encounter: Payer: 59 | Admitting: Anesthesiology

## 2020-12-07 ENCOUNTER — Ambulatory Visit (INDEPENDENT_AMBULATORY_CARE_PROVIDER_SITE_OTHER): Payer: BC Managed Care – PPO | Admitting: Family Medicine

## 2020-12-07 ENCOUNTER — Encounter: Payer: Self-pay | Admitting: Family Medicine

## 2020-12-07 DIAGNOSIS — M722 Plantar fascial fibromatosis: Secondary | ICD-10-CM | POA: Diagnosis not present

## 2020-12-07 MED ORDER — METHYLPREDNISOLONE ACETATE 40 MG/ML IJ SUSP
40.0000 mg | Freq: Once | INTRAMUSCULAR | Status: AC
  Administered 2020-12-07: 11:00:00 40 mg via INTRA_ARTICULAR

## 2020-12-07 NOTE — Progress Notes (Signed)
PCP: Seward Carol, MD  Subjective:   HPI: Patient is a 65 y.o. male here for right foot pain.  Patient was seen on 11/9 for plantar right foot pain. States pain has worsened despite stretches, icing. Bought a heel lift from the store recently as well without much benefit. Pain plantar right heel. No new injuries.  Past Medical History:  Diagnosis Date   Allergic rhinitis due to pollen    Anal fissure    Bilateral sciatica 07/09/2018   Chronic pain syndrome 07/09/2018   Chronic, continuous use of opioids 07/09/2018   DDD (degenerative disc disease), lumbar    Essential hypertension, malignant    Hemorrhoids    Lumbago    Lumbar post-laminectomy syndrome 07/09/2018   Plantar fasciitis of left foot    Pure hypercholesterolemia    Spinal stenosis of lumbar region 07/09/2018   Type II or unspecified type diabetes mellitus without mention of complication, not stated as uncontrolled    dx in 2005   Unspecified vitamin D deficiency     Current Outpatient Medications on File Prior to Visit  Medication Sig Dispense Refill   amLODipine (NORVASC) 2.5 MG tablet Take 2.5 mg by mouth daily.     aspirin 81 MG tablet Take 81 mg by mouth daily.     atorvastatin (LIPITOR) 80 MG tablet daily in the afternoon.     Blood Glucose Monitoring Suppl (ONETOUCH VERIO) w/Device KIT Use to check blood sugars once daily 1 kit 0   Dapagliflozin-metFORMIN HCl ER (XIGDUO XR) 05-998 MG TB24 Take 1 tablet by mouth daily. 30 tablet 2   econazole nitrate 1 % cream Apply topically 2 (two) times daily.     ezetimibe (ZETIA) 10 MG tablet Take 1 tablet by mouth once daily 90 tablet 1   fluticasone (FLONASE) 50 MCG/ACT nasal spray USE 2 SPRAY(S) IN EACH NOSTRIL ONCE DAILY     furosemide (LASIX) 20 MG tablet Take 1 tablet (20 mg total) by mouth daily as needed. 90 tablet 3   glucose blood test strip 1 each by Other route as needed for other. One Touch Verio- Use to test blood sugars once daily 100 each 0   ibuprofen  (ADVIL) 600 MG tablet Take 600 mg by mouth as needed.     isosorbide mononitrate (IMDUR) 30 MG 24 hr tablet Take 0.5 tablets (15 mg total) by mouth daily. 45 tablet 3   ketoconazole (NIZORAL) 2 % cream as needed.     meloxicam (MOBIC) 7.5 MG tablet Take 1 tablet (7.5 mg total) by mouth daily. 30 tablet 5   metoprolol tartrate (LOPRESSOR) 100 MG tablet Take 1 tablet (100 mg total) by mouth once for 1 dose. Please take one time dose $RemoveB'100mg'RypbhXVz$  metoprolol tartrate 2 hr prior to cardiac CT for HR control IF HR >55bpm. 1 tablet 0   olmesartan-hydrochlorothiazide (BENICAR HCT) 20-12.5 MG tablet Take 1 tablet by mouth daily. Take 1 tablet by mouth once daily. (Patient not taking: Reported on 06/29/2020)     OneTouch Delica Lancets 51W MISC Use to test blood sugars once daily 100 each 0   oxyCODONE-acetaminophen (PERCOCET) 10-325 MG tablet Take 1 tablet by mouth every 6 (six) hours as needed for pain. 120 tablet 0   sildenafil (REVATIO) 20 MG tablet Take 2-3 tablets by mouth as needed, as directed Discontinue if having headaches. (Patient not taking: Reported on 06/29/2020) 30 tablet 1   triamcinolone cream (KENALOG) 0.5 % as needed.     No current facility-administered medications on  file prior to visit.    Past Surgical History:  Procedure Laterality Date   COLONOSCOPY     in [facility], MD no longer in practice, does not recall the name of facility. Does belive polyps were removed   [place] SURGERY  2011   total of 3 sx on back per pt   MAXIMUM ACCESS (MAS)POSTERIOR LUMBAR INTERBODY FUSION (PLIF) 1 LEVEL N/A 06/10/2014   Procedure: Redo Lumbar Five-Sacral One Decompression with maximum access posterior lumbar interbody fusion;  Surgeon: Erline Levine, MD;  Location: [place] [facility];  Service: Neurosurgery;  Laterality: N/A;  Redo Decompression with maximum access posterior lumbar interbody fusion, L5-S1    Allergies  Allergen Reactions   Losartan Potassium-Hctz Other (See Comments)    BP 120/84   Ht $R'6\' 4"'Hx$   (1.93 m)   Wt 210 lb (95.3 kg)   BMI 25.56 kg/m   [facility] 11/18/2020 12/07/2020  Frequency of aerobic exercise (# of days/week) 2 2  Average time in minutes 20 20  Frequency of strengthening activities (# of days/week) 2 2    No flowsheet data found.      Objective:  Physical Exam:  Gen: NAD, comfortable in exam room  Right foot/ankle: Pes planus.  No other gross deformity, swelling, ecchymoses FROM TTP plantar calcaneus at plantar fascia insertion. negative ant drawer and negative talar tilt.   Negative calcaneal squeeze. Thompsons test negative. NV intact distally.   Assessment & Plan:  1. Right plantar fasciitis - reviewed home exercises and stretches.  Icing, arch binder.  He opted for injection today as well.  Consider physical therapy, sports insoles with scaphoid pads (states he wants to do over the counter ones).  F/u in 1 month.  After informed written consent timeout was performed.  Patient was seated on exam table.  Area overlying right plantar fascia medially prepped with alcohol swab then utilizing ultrasound guidance patient's right plantar fascia was injected with 1:1 lidocaine: depomedrol.  Patient tolerated procedure well without immediate complications.

## 2020-12-07 NOTE — Patient Instructions (Signed)
You have plantar fasciitis Plantar fascia stretch for 20-30 seconds (do 3 of these) in morning Lowering/raise on a step exercises 3 x 10 once or twice a day - this is very important for long term recovery. Can add heel walks, toe walks forward and backward as well Ice heel for 15 minutes as needed. Avoid flat shoes/barefoot walking as much as possible. Arch straps have been shown to help with pain. Inserts are important (superfeet, spencos, our green insoles, custom orthotics). Steroid injection is a consideration for short term pain relief if you are struggling - you were given this today. Physical therapy is also an option. Follow up in just over 1 month.

## 2020-12-22 ENCOUNTER — Other Ambulatory Visit: Payer: 59

## 2020-12-24 ENCOUNTER — Ambulatory Visit: Payer: 59 | Admitting: Endocrinology

## 2020-12-30 ENCOUNTER — Ambulatory Visit: Payer: 59 | Admitting: Sports Medicine

## 2020-12-31 ENCOUNTER — Other Ambulatory Visit: Payer: Self-pay | Admitting: Endocrinology

## 2021-01-08 ENCOUNTER — Other Ambulatory Visit: Payer: Self-pay | Admitting: Endocrinology

## 2021-01-11 ENCOUNTER — Other Ambulatory Visit: Payer: Self-pay | Admitting: Endocrinology

## 2021-01-19 ENCOUNTER — Other Ambulatory Visit: Payer: Self-pay | Admitting: Internal Medicine

## 2021-01-19 DIAGNOSIS — N2889 Other specified disorders of kidney and ureter: Secondary | ICD-10-CM

## 2021-01-22 ENCOUNTER — Other Ambulatory Visit: Payer: Self-pay

## 2021-01-25 ENCOUNTER — Other Ambulatory Visit: Payer: Self-pay | Admitting: Endocrinology

## 2021-02-08 ENCOUNTER — Other Ambulatory Visit: Payer: 59

## 2021-02-21 ENCOUNTER — Other Ambulatory Visit: Payer: Self-pay | Admitting: Endocrinology

## 2021-02-23 ENCOUNTER — Other Ambulatory Visit: Payer: 59

## 2021-03-01 ENCOUNTER — Ambulatory Visit (INDEPENDENT_AMBULATORY_CARE_PROVIDER_SITE_OTHER): Payer: BC Managed Care – PPO | Admitting: Endocrinology

## 2021-03-01 ENCOUNTER — Other Ambulatory Visit: Payer: Self-pay

## 2021-03-01 ENCOUNTER — Other Ambulatory Visit (INDEPENDENT_AMBULATORY_CARE_PROVIDER_SITE_OTHER): Payer: BC Managed Care – PPO

## 2021-03-01 ENCOUNTER — Encounter: Payer: Self-pay | Admitting: Endocrinology

## 2021-03-01 VITALS — BP 132/84 | HR 84 | Ht 75.0 in | Wt 213.2 lb

## 2021-03-01 DIAGNOSIS — I10 Essential (primary) hypertension: Secondary | ICD-10-CM | POA: Diagnosis not present

## 2021-03-01 DIAGNOSIS — E119 Type 2 diabetes mellitus without complications: Secondary | ICD-10-CM

## 2021-03-01 DIAGNOSIS — E785 Hyperlipidemia, unspecified: Secondary | ICD-10-CM | POA: Diagnosis not present

## 2021-03-01 LAB — COMPREHENSIVE METABOLIC PANEL WITH GFR
ALT: 23 U/L (ref 0–53)
AST: 20 U/L (ref 0–37)
Albumin: 4.1 g/dL (ref 3.5–5.2)
Alkaline Phosphatase: 47 U/L (ref 39–117)
BUN: 20 mg/dL (ref 6–23)
CO2: 34 meq/L — ABNORMAL HIGH (ref 19–32)
Calcium: 9.4 mg/dL (ref 8.4–10.5)
Chloride: 106 meq/L (ref 96–112)
Creatinine, Ser: 1.31 mg/dL (ref 0.40–1.50)
GFR: 56.68 mL/min — ABNORMAL LOW
Glucose, Bld: 113 mg/dL — ABNORMAL HIGH (ref 70–99)
Potassium: 4.5 meq/L (ref 3.5–5.1)
Sodium: 142 meq/L (ref 135–145)
Total Bilirubin: 1.2 mg/dL (ref 0.2–1.2)
Total Protein: 6.3 g/dL (ref 6.0–8.3)

## 2021-03-01 LAB — LIPID PANEL
Cholesterol: 236 mg/dL — ABNORMAL HIGH (ref 0–200)
HDL: 89.9 mg/dL (ref >39.00)
LDL Cholesterol: 131 mg/dL — ABNORMAL HIGH (ref 0–99)
NonHDL: 146.57
Total CHOL/HDL Ratio: 3
Triglycerides: 76 mg/dL (ref 0.0–149.0)
VLDL: 15.2 mg/dL (ref 0.0–40.0)

## 2021-03-01 LAB — HEMOGLOBIN A1C: Hgb A1c MFr Bld: 6.2 % (ref 4.6–6.5)

## 2021-03-01 MED ORDER — ATORVASTATIN CALCIUM 80 MG PO TABS: 80.0000 mg | ORAL_TABLET | Freq: Every day | ORAL | 1 refills | Status: DC

## 2021-03-01 MED ORDER — EZETIMIBE 10 MG PO TABS: 10.0000 mg | ORAL_TABLET | Freq: Every day | ORAL | 1 refills | Status: DC

## 2021-03-01 MED ORDER — FARXIGA 5 MG PO TABS: 5.0000 mg | ORAL_TABLET | Freq: Every morning | ORAL | 5 refills | Status: DC

## 2021-03-01 MED ORDER — METFORMIN HCL ER 500 MG PO TB24: 500.0000 mg | ORAL_TABLET | Freq: Two times a day (BID) | ORAL | 1 refills | Status: DC

## 2021-03-01 NOTE — Progress Notes (Signed)
Patient ID: Cameron Cardenas, male   DOB: 08-29-1955, 66 y.o.   MRN: [id number]   Reason for Appointment: Endocrinology follow-up      History of Present Illness   Diagnosis: Type 2 DIABETES MELITUS, date of diagnosis: 2004  PAST history: He had mild diabetes at onset and was treated with metformin and subsequently Actoplusmet In 2013 he had changed his diet significantly and started exercising. This helped him with weight loss Subsequently his blood sugars have been excellent with A1c upper normal  RECENT history:   Oral hypoglycemic drugs: Actos 30 mg daily, Metformin 500 mg twice daily, Farxiga 5 mg daily  His A1c is about the same at 6.2   Current management, problems and blood sugar patterns: Although he was tried on Xigduo last year with the same metformin dose is with the metformin ER he says it caused diarrhea and is back to taking [place] and metformin separately  He ran out of his Actos and not clear when he last has taken it  He thinks he is checking his blood sugar 3 times a week but did not bring the monitor for download Weight is about the same as last year With his new job he is traveling more and not always consistent with diet  He says he is trying to do walking about 3 days a week up to 45 minutes             Side effects from medications: None  Monitors blood glucose every other day: Using meter: One Touch  Blood sugars from patient recall: Highest 170 PC   Dietician visit: Most recent: 12/13               Wt Readings from Last 3 Encounters:  03/01/21 213 lb 3.2 oz (96.7 kg)  12/07/20 210 lb (95.3 kg)  11/18/20 210 lb (95.3 kg)   Lab Results  Component Value Date   HGBA1C 6.2 03/01/2021   HGBA1C 6.1 06/22/2020   HGBA1C 6.1 03/17/2020   Lab Results  Component Value Date   MICROALBUR <0.7 03/17/2020   LDLCALC 131 (H) 03/01/2021   CREATININE 1.31 03/01/2021    Appointment on 03/01/2021  Component Date Value Ref Range Status   Hgb  A1c MFr Bld 03/01/2021 6.2  4.6 - 6.5 % Final   Glycemic Control Guidelines for People with Diabetes:Non Diabetic:  <6%Goal of Therapy: <7%Additional Action Suggested:  >8%    Cholesterol 03/01/2021 236 (H)  0 - 200 mg/dL Final   ATP III Classification       Desirable:  < 200 mg/dL               Borderline High:  200 - 239 mg/dL          High:  > = 240 mg/dL   Triglycerides 03/01/2021 76.0  0.0 - 149.0 mg/dL Final   Normal:  <150 mg/dLBorderline High:  150 - 199 mg/dL   HDL 03/01/2021 89.90  >39.00 mg/dL Final   VLDL 03/01/2021 15.2  0.0 - 40.0 mg/dL Final   LDL Cholesterol 03/01/2021 131 (H)  0 - 99 mg/dL Final   Total CHOL/HDL Ratio 03/01/2021 3   Final                  Men          Women1/2 Average Risk     3.4          3.3Average Risk  5.0          4.42X Average Risk          9.6          7.13X Average Risk          15.0          11.0                       NonHDL 03/01/2021 146.57   Final   NOTE:  Non-HDL goal should be 30 mg/dL higher than patient's LDL goal (i.e. LDL goal of < 70 mg/dL, would have non-HDL goal of < 100 mg/dL)   Sodium 03/01/2021 142  135 - 145 mEq/L Final   Potassium 03/01/2021 4.5  3.5 - 5.1 mEq/L Final   Chloride 03/01/2021 106  96 - 112 mEq/L Final   CO2 03/01/2021 34 (H)  19 - 32 mEq/L Final   Glucose, Bld 03/01/2021 113 (H)  70 - 99 mg/dL Final   BUN 03/01/2021 20  6 - 23 mg/dL Final   Creatinine, Ser 03/01/2021 1.31  0.40 - 1.50 mg/dL Final   Total Bilirubin 03/01/2021 1.2  0.2 - 1.2 mg/dL Final   Alkaline Phosphatase 03/01/2021 47  39 - 117 U/L Final   AST 03/01/2021 20  0 - 37 U/L Final   ALT 03/01/2021 23  0 - 53 U/L Final   Total Protein 03/01/2021 6.3  6.0 - 8.3 g/dL Final   Albumin 03/01/2021 4.1  3.5 - 5.2 g/dL Final   GFR 03/01/2021 56.68 (L)  >60.00 mL/min Final   Calculated using the CKD-EPI Creatinine Equation (2021)   Calcium 03/01/2021 9.4  8.4 - 10.5 mg/dL Final     Allergies as of 03/01/2021       Reactions   Losartan  Potassium-hctz Other (See Comments)        Medication List        Accurate as of March 01, 2021  3:55 PM. If you have any questions, ask your nurse or doctor.          amLODipine 2.5 MG tablet Commonly known as: NORVASC Take 2.5 mg by mouth daily.   aspirin 81 MG tablet Take 81 mg by mouth daily.   atorvastatin 80 MG tablet Commonly known as: LIPITOR daily in the afternoon.   econazole nitrate 1 % cream Apply topically 2 (two) times daily.   ezetimibe 10 MG tablet Commonly known as: ZETIA TAKE 1 TABLET BY MOUTH EVERY DAY   Farxiga 5 MG Tabs tablet Generic drug: dapagliflozin propanediol Take 5 mg by mouth every morning.   fluticasone 50 MCG/ACT nasal spray Commonly known as: FLONASE USE 2 SPRAY(S) IN EACH NOSTRIL ONCE DAILY   furosemide 20 MG tablet Commonly known as: LASIX Take 1 tablet (20 mg total) by mouth daily as needed.   glucose blood test strip 1 each by Other route as needed for other. One Touch Verio- Use to test blood sugars once daily   ibuprofen 600 MG tablet Commonly known as: ADVIL Take 600 mg by mouth as needed.   isosorbide mononitrate 30 MG 24 hr tablet Commonly known as: IMDUR Take 0.5 tablets (15 mg total) by mouth daily.   ketoconazole 2 % cream Commonly known as: NIZORAL as needed.   meloxicam 7.5 MG tablet Commonly known as: Mobic Take 1 tablet (7.5 mg total) by mouth daily.   metFORMIN 500 MG 24 hr tablet Commonly known as: GLUCOPHAGE-XR SMARTSIG:2 Tablet(s) By Mouth Every  Evening   metoprolol tartrate 100 MG tablet Commonly known as: LOPRESSOR Take 1 tablet (100 mg total) by mouth once for 1 dose. Please take one time dose 141m metoprolol tartrate 2 hr prior to cardiac CT for HR control IF HR >55bpm.   olmesartan-hydrochlorothiazide 20-12.5 MG tablet Commonly known as: BENICAR HCT Take 1 tablet by mouth daily. Take 1 tablet by mouth once daily.   OneTouch Delica Lancets 368TMisc Use to test blood sugars once  daily   OneTouch Verio w/Device Kit Use to check blood sugars once daily   pioglitazone 30 MG tablet Commonly known as: ACTOS TAKE 1 TABLET BY MOUTH EVERY DAY   sildenafil 20 MG tablet Commonly known as: REVATIO Take 2-3 tablets by mouth as needed, as directed Discontinue if having headaches.   triamcinolone cream 0.5 % Commonly known as: KENALOG as needed.   Xigduo XR 05-998 MG Tb24 Generic drug: Dapagliflozin-metFORMIN HCl ER Take 1 tablet by mouth daily.        Allergies:  Allergies  Allergen Reactions   Losartan Potassium-Hctz Other (See Comments)    Past Medical History:  Diagnosis Date   Allergic rhinitis due to pollen    Anal fissure    Bilateral sciatica 07/09/2018   Chronic pain syndrome 07/09/2018   Chronic, continuous use of opioids 07/09/2018   DDD (degenerative disc disease), lumbar    Essential hypertension, malignant    Hemorrhoids    Lumbago    Lumbar post-laminectomy syndrome 07/09/2018   Plantar fasciitis of left foot    Pure hypercholesterolemia    Spinal stenosis of lumbar region 07/09/2018   Type II or unspecified type diabetes mellitus without mention of complication, not stated as uncontrolled    dx in 2005   Unspecified vitamin D deficiency     Past Surgical History:  Procedure Laterality Date   [facility], does not recall the name of facility. Does belive polyps were removed   [facility]  [place]   total of 3 sx on back per pt   MAXIMUM ACCESS (MAS)POSTERIOR LUMBAR INTERBODY FUSION (PLIF) 1 LEVEL N/A 06/10/2014   Procedure: Redo Lumbar Five-Sacral One Decompression with maximum access posterior lumbar interbody fusion;  Surgeon: JErline Levine MD;  [facility];  Service: Neurosurgery;  Laterality: N/A;  Redo Decompression with maximum access posterior lumbar interbody fusion, L5-S1    Family History  Problem Relation Age of Onset   Hypertension Mother    Kidney disease Mother     Diabetes Mother    Hypertension Father    Diabetes Sister    Cataracts Sister    Seizures Brother        caused his death   Colon cancer Neg Hx    Esophageal cancer Neg Hx    Rectal cancer Neg Hx    Stomach cancer Neg Hx    Heart disease Neg Hx    Colon polyps Neg Hx     Social History:  reports that he quit smoking about 2 years ago. His smoking use included cigars. He has never used smokeless tobacco. He reports that he does not currently use alcohol. He reports that he does not currently use drugs after having used the following drugs: Marijuana.   Appointment on 03/01/2021  Component Date Value Ref Range Status   Hgb A1c MFr Bld 03/01/2021 6.2  4.6 - 6.5 % Final   Glycemic Control Guidelines for People with Diabetes:Non Diabetic:  <  6%Goal of Therapy: <7%Additional Action Suggested:  >8%    Cholesterol 03/01/2021 236 (H)  0 - 200 mg/dL Final   ATP III Classification       Desirable:  < 200 mg/dL               Borderline High:  200 - 239 mg/dL          High:  > = 240 mg/dL   Triglycerides 03/01/2021 76.0  0.0 - 149.0 mg/dL Final   Normal:  <150 mg/dLBorderline High:  150 - 199 mg/dL   HDL 03/01/2021 89.90  >39.00 mg/dL Final   VLDL 03/01/2021 15.2  0.0 - 40.0 mg/dL Final   LDL Cholesterol 03/01/2021 131 (H)  0 - 99 mg/dL Final   Total CHOL/HDL Ratio 03/01/2021 3   Final                  Men          Women1/2 Average Risk     3.4          3.3Average Risk          5.0          4.42X Average Risk          9.6          7.13X Average Risk          15.0          11.0                       NonHDL 03/01/2021 146.57   Final   NOTE:  Non-HDL goal should be 30 mg/dL higher than patient's LDL goal (i.e. LDL goal of < 70 mg/dL, would have non-HDL goal of < 100 mg/dL)   Sodium 03/01/2021 142  135 - 145 mEq/L Final   Potassium 03/01/2021 4.5  3.5 - 5.1 mEq/L Final   Chloride 03/01/2021 106  96 - 112 mEq/L Final   CO2 03/01/2021 34 (H)  19 - 32 mEq/L Final   Glucose, Bld 03/01/2021 113 (H)  70  - 99 mg/dL Final   BUN 03/01/2021 20  6 - 23 mg/dL Final   Creatinine, Ser 03/01/2021 1.31  0.40 - 1.50 mg/dL Final   Total Bilirubin 03/01/2021 1.2  0.2 - 1.2 mg/dL Final   Alkaline Phosphatase 03/01/2021 47  39 - 117 U/L Final   AST 03/01/2021 20  0 - 37 U/L Final   ALT 03/01/2021 23  0 - 53 U/L Final   Total Protein 03/01/2021 6.3  6.0 - 8.3 g/dL Final   Albumin 03/01/2021 4.1  3.5 - 5.2 g/dL Final   GFR 03/01/2021 56.68 (L)  >60.00 mL/min Final   Calculated using the CKD-EPI Creatinine Equation (2021)   Calcium 03/01/2021 9.4  8.4 - 10.5 mg/dL Final    Review of Systems:  HYPERTENSION:   Treated with Benicar HCT, followed by PCP  Appears that he is not taking this recently and his blood pressure appears to be consistently higher and higher today  Not monitoring at home  BP Readings from Last 3 Encounters:  03/01/21 132/84  12/07/20 120/84  11/18/20 132/84   No history of renal dysfunction or microalbuminuria Creatinine slightly, he was given Mobic by pain management in April  Lab Results  Component Value Date   CREATININE 1.31 03/01/2021   CREATININE 1.46 (H) 07/02/2020   CREATININE 1.26 06/22/2020     HYPERLIPIDEMIA: The lipid abnormality consists of  elevated LDL  He had been on 80 mg atorvastatin since 6/16 He does not know when he last has taken this and has not followed up with a cardiologist Previously did have LDL particle number of 1348 previously and which was relatively high at 1216 in 6/16  He apparently has not taken his Zetia, previously his LDL is at target  Labs as follows:    Lab Results  Component Value Date   CHOL 236 (H) 03/01/2021   CHOL 169 03/17/2020   CHOL 136 05/20/2019   Lab Results  Component Value Date   HDL 89.90 03/01/2021   HDL 90.10 03/17/2020   HDL 65.10 05/20/2019   Lab Results  Component Value Date   LDLCALC 131 (H) 03/01/2021   LDLCALC 67 03/17/2020   LDLCALC 58 05/20/2019   Lab Results  Component Value Date    TRIG 76.0 03/01/2021   TRIG 59.0 03/17/2020   TRIG 66.0 05/20/2019   Lab Results  Component Value Date   CHOLHDL 3 03/01/2021   CHOLHDL 2 03/17/2020   CHOLHDL 2 05/20/2019   No results found for: LDLDIRECT  Lab Results  Component Value Date   CHOL 236 (H) 03/01/2021   HDL 89.90 03/01/2021   LDLCALC 131 (H) 03/01/2021   TRIG 76.0 03/01/2021   CHOLHDL 3 03/01/2021    Foot exam normal today    Examination:   BP 132/84    Pulse 84    Ht _0  (1.905 m)    Wt 213 lb 3.2 oz (96.7 kg)    SpO2 98%    BMI 26.65 kg/m   Body mass index is 26.65 kg/m.   Diabetic Foot Exam - Simple   Simple Foot Form Diabetic Foot exam was performed with the following findings: Yes   Visual Inspection No deformities, no ulcerations, no other skin breakdown bilaterally: Yes Sensation Testing Intact to touch and monofilament testing bilaterally: Yes Pulse Check Posterior Tibialis and Dorsalis pulse intact bilaterally: Yes Comments       ASSESSMENT/ PLAN:   Diabetes type 2, nonobese  He has had mild diabetes with consistently good A1c levels in the normal range  He has been on Farxiga 5 mg, Actos 30 mg and Metformin 1 g daily   A1c is stable at 6.2  He has maintained fairly good control Usually not monitoring much at home and not clear what his blood sugars are He is starting to do some walking for exercise but recently has gained a little weight Currently tolerating metformin ER 500 mg twice daily and does not want to try Xigduo or combination   Recommendations: Discussed need to be consistent with diet and exercise Discussed blood sugar targets after meals Continue same medications  HYPERTENSION:  Now better controlled and he will also follow-up with PCP  LIPIDS: Poorly controlled as he has not taken either a statin or Zetia and will recent prescriptions  There are no Patient Instructions on file for this visit.  Elayne Snare 03/01/2021, 3:55 PM     Note: This office note  was prepared with Dragon voice recognition system technology. Any transcriptional errors that result from this process are unintentional.

## 2021-03-08 ENCOUNTER — Other Ambulatory Visit: Payer: 59

## 2021-03-10 ENCOUNTER — Telehealth: Payer: Self-pay | Admitting: Interventional Cardiology

## 2021-03-10 DIAGNOSIS — R079 Chest pain, unspecified: Secondary | ICD-10-CM

## 2021-03-10 NOTE — Telephone Encounter (Signed)
Pt c/o of Chest Pain: STAT if CP now or developed within 24 hours ? ?1. Are you having CP right now? yes ? ?2. Are you experiencing any other symptoms (ex. SOB, nausea, vomiting, sweating)? SOB ? ?3. How long have you been experiencing CP? A couple weeks ? ?4. Is your CP continuous or coming and going? continuous ? ?5. Have you taken Nitroglycerin? Not today. Patient took one about a week ago ? ?Patient said pain is in the bottom left side of his chest  ? ?Pt c/o Shortness Of Breath: STAT if SOB developed within the last 24 hours or pt is noticeably SOB on the phone ? ?1. Are you currently SOB (can you hear that pt is SOB on the phone)? no ? ?2. How long have you been experiencing SOB? A couple weeks ? ?3. Are you SOB when sitting or when up moving around? Up and moving around  ? ?4. Are you currently experiencing any other symptoms? Hurts to cough ? ?

## 2021-03-10 NOTE — Telephone Encounter (Signed)
Spoke with pt and has noted chest pain for 2 weeks this occurs daily and last approx 20 30 minutes a couple of times a day Per pt can increase pain level if moves a certain way or coughs Discussed with Dr Tamala Julian have pt get 2v CXR and my take OTC Advil  or Tylenol  as directed and call back if no  improvement Pt aware of recommendations and will get CXR this pm ./cy ?

## 2021-03-31 ENCOUNTER — Ambulatory Visit (INDEPENDENT_AMBULATORY_CARE_PROVIDER_SITE_OTHER): Payer: BC Managed Care – PPO | Admitting: Sports Medicine

## 2021-03-31 VITALS — BP 124/67 | Ht 75.0 in | Wt 210.0 lb

## 2021-03-31 DIAGNOSIS — M2141 Flat foot [pes planus] (acquired), right foot: Secondary | ICD-10-CM | POA: Diagnosis not present

## 2021-03-31 DIAGNOSIS — M2142 Flat foot [pes planus] (acquired), left foot: Secondary | ICD-10-CM | POA: Diagnosis not present

## 2021-03-31 DIAGNOSIS — M722 Plantar fascial fibromatosis: Secondary | ICD-10-CM | POA: Diagnosis not present

## 2021-03-31 MED ORDER — MELOXICAM 15 MG PO TABS: 15.0000 mg | ORAL_TABLET | Freq: Every day | ORAL | 1 refills | Status: DC

## 2021-03-31 NOTE — Progress Notes (Addendum)
PCP: Merrilee Seashore, MD ? ?Subjective:  ? ?HPI: ?Cameron Cardenas is a 66 y.o. male here for follow-up of right heel pain and left knee pain/weakness. ? ?Patient was initially seen by me on 11/18/2020 and thought to have plantar fasciitis.  He failed conservative management and followed up 3 weeks later with Dr. Barbaraann Barthel and had a ultrasound-guided plantar fascia injection on 12/07/2020.  He states that he did not receive any benefit from the injection.  He presents today because of progressive worsening right heel pain.  Denies any redness, no fever or chills reported.  He states he does have pain with for steps in the morning, but feels like his pain worsens the more he is on the heel.  He has been walking with a painful gait. ? ?At the end of the visit he does report some left knee pain and mild weakness.  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  Denies any specific injury.  His discomfort is over the anterior aspect of the knee.  He denies any clicking catching or giving out of the knee.  No swelling noted. ? ?Independent review of MRI of lumbar spine on 01/14/2018 demonstrates some central canal narrowing at the L4-L5 level it does appear to impinge the left L4 nerve.  He does have DDD throughout the L3 and L5 disc spaces. ? ?IMPRESSION: ?1. Mild and worsened central narrowing of the thecal sac at L4-5 due ?to progressive disc bulge along with intervertebral and facet ?spurring. There is also borderline bilateral foraminal impingement ?at this level, and mild displacement the left L4 nerve in the ?lateral extraforaminal space due to the underlying degenerative disc ?disease. ?2. Stable appearance of low-lying conus at the L2 level. No mass ?lesion along the minimally fatty filum terminale. ?3. No impingement at the fused L5-S1 level. ?  ?  ?Electronically Signed ?  By: Van Clines M.D. ?  On: 01/14/2018  12:55 ? ? ?Past Medical History:  ?Diagnosis Date  ? Allergic rhinitis due to pollen   ? Anal fissure   ? Bilateral sciatica 07/09/2018  ? Chronic pain syndrome 07/09/2018  ? Chronic, continuous use of opioids 07/09/2018  ? DDD (degenerative disc disease), lumbar   ? Essential hypertension, malignant   ? Hemorrhoids   ? Lumbago   ? Lumbar post-laminectomy syndrome 07/09/2018  ? Plantar fasciitis of left foot   ? Pure hypercholesterolemia   ? Spinal stenosis of lumbar region 07/09/2018  ? Type II or unspecified type diabetes mellitus without mention of complication, not stated as uncontrolled   ? dx in 2005  ? Unspecified vitamin D deficiency   ? ? ?Current Outpatient Medications on File Prior to Visit  ?Medication Sig Dispense Refill  ? amLODipine (NORVASC) 2.5 MG tablet Take 2.5 mg by mouth daily.    ? aspirin 81 MG tablet Take 81 mg by mouth daily.    ? atorvastatin (LIPITOR) 80 MG tablet Take 1 tablet (80 mg total) by mouth daily. 90 tablet 1  ? Blood Glucose Monitoring Suppl (ONETOUCH VERIO) w/Device KIT Use to check blood sugars once daily 1 kit 0  ? econazole nitrate 1 % cream Apply topically 2 (two) times daily. (Patient not taking: Reported on 03/01/2021)    ? ezetimibe (ZETIA) 10 MG tablet Take 1 tablet (10 mg total) by mouth daily. 90 tablet 1  ? FARXIGA 5 MG TABS  tablet Take 1 tablet (5 mg total) by mouth every morning. 30 tablet 5  ? fluticasone (FLONASE) 50 MCG/ACT nasal spray USE 2 SPRAY(S) IN EACH NOSTRIL ONCE DAILY (Patient not taking: Reported on 03/01/2021)    ? furosemide (LASIX) 20 MG tablet Take 1 tablet (20 mg total) by mouth daily as needed. 90 tablet 3  ? glucose blood test strip 1 each by Other route as needed for other. One Touch Verio- Use to test blood sugars once daily (Patient not taking: Reported on 03/01/2021) 100 each 0  ? ibuprofen (ADVIL) 600 MG tablet Take 600 mg by mouth as needed.    ? isosorbide mononitrate (IMDUR) 30 MG 24 hr tablet Take 0.5 tablets (15 mg total) by mouth daily. 45  tablet 3  ? ketoconazole (NIZORAL) 2 % cream as needed.    ? metFORMIN (GLUCOPHAGE-XR) 500 MG 24 hr tablet Take 1 tablet (500 mg total) by mouth 2 (two) times daily. 180 tablet 1  ? metoprolol tartrate (LOPRESSOR) 100 MG tablet Take 1 tablet (100 mg total) by mouth once for 1 dose. Please take one time dose 100mg metoprolol tartrate 2 hr prior to cardiac CT for HR control IF HR >55bpm. 1 tablet 0  ? olmesartan-hydrochlorothiazide (BENICAR HCT) 20-12.5 MG tablet Take 1 tablet by mouth daily. Take 1 tablet by mouth once daily.    ? OneTouch Delica Lancets 33G MISC Use to test blood sugars once daily 100 each 0  ? pioglitazone (ACTOS) 30 MG tablet TAKE 1 TABLET BY MOUTH EVERY DAY 90 tablet 0  ? sildenafil (REVATIO) 20 MG tablet Take 2-3 tablets by mouth as needed, as directed Discontinue if having headaches. (Patient not taking: Reported on 06/29/2020) 30 tablet 1  ? triamcinolone cream (KENALOG) 0.5 % as needed. (Patient not taking: Reported on 03/01/2021)    ? ?No current facility-administered medications on file prior to visit.  ? ? ?Past Surgical History:  ?Procedure Laterality Date  ? COLONOSCOPY    ? in [place], MD no longer in practice, does not recall the name of facility. Does belive polyps were removed  ? LUMBAR DISC SURGERY  2011  ? total of 3 sx on back per pt  ? MAXIMUM ACCESS (MAS)POSTERIOR LUMBAR INTERBODY FUSION (PLIF) 1 LEVEL N/A 06/10/2014  ? Procedure: Redo Lumbar Five-Sacral One Decompression with maximum access posterior lumbar interbody fusion;  Surgeon: Joseph Stern, MD;  Location: [facility];  Service: Neurosurgery;  Laterality: N/A;  Redo Decompression with maximum access posterior lumbar interbody fusion, L5-S1  ? ? ?Allergies  ?Allergen Reactions  ? Losartan Potassium-Hctz Other (See Comments)  ? ? ?BP 124/67   Ht 6' 3" (1.905 m)   Wt 210 lb (95.3 kg)   BMI 26.25 kg/m?  ? ? ?  11/18/2020  ? 10:22 AM 12/07/2020  ? 10:03 AM  ?[facility]  ?Frequency of aerobic exercise (#  of days/week) 2 2  ?Average time in minutes 20 20  ?Frequency of strengthening activities (# of days/week) 2 2  ? ? ?   ? View : No data to display.  ?  ?  ?  ? ? ?    ?Objective:  ?Physical Exam: ? ?Gen: Well-appearing, in no acute distress; non-toxic ?CV: Regular Rate. Well-perfused. Warm.  ?Resp: Breathing unlabored on room air; no wheezing. ?Psych: Fluid speech in conversation; appropriate affect; normal thought process ?Neuro: Sensation intact throughout. No gross coordination deficits.  ?MSK:  ?- Left knee: Inspection yields no erythema, ecchymosis   or swelling.  Range of motion is preserved from 0-135 degrees.  There is no instability varus or valgus stress.  There is some very slight weakness with extension of the knee compared to the contralateral knee, otherwise strength 5/5 throughout.  No significant joint line TTP.  Does have some mild TTP over the superior aspect of the patella. ? ?- Right heel: Positive TTP over the plantar aspect of calcaneus and the posterior medial aspect near the insertion of the plantar fascia.  No TTP of the Achilles tendon, malleolus, or dorsal foot. Negative calcaneal squeeze test.  There is no erythema, swelling or ecchymosis noted.  Range of motion of the ankle is intact although slightly diminished dorsiflexion.  Evaluation of standing demonstrates pes planus bilaterally with loss of longitudinal and mildly loss of transverse arch right greater than left.  Have a degree of hindfoot valgus as well.  Antalgic gait. ?  ?Assessment & Plan:  ?1. Chronic right heel pain -initially treated for plantar fasciitis, but did not receive any benefit from plantar fascia injection a few months back.  ?2.  Pes planus bilaterally with hindfoot valgus collapse ?3.  Left anterior knee discomfort ?4.  History of lumbar DDD with history of reported laminectomy and PLIF of L5-S1 ? ?Procedure: ECSWT ?Indications: Right heel pain, plantar fasciitis ?  ?Procedure Details ?Consent: Risks of procedure  as well as the alternatives and risks of each were explained to the patient.  Written consent for procedure obtained. ?Time Out: Verified patient identification, verified procedure, site was marked, verified corr

## 2021-04-05 ENCOUNTER — Encounter: Payer: 59 | Admitting: Family Medicine

## 2021-04-19 ENCOUNTER — Other Ambulatory Visit: Payer: Self-pay | Admitting: Endocrinology

## 2021-04-26 ENCOUNTER — Ambulatory Visit: Payer: 59 | Admitting: Family Medicine

## 2021-04-30 ENCOUNTER — Other Ambulatory Visit: Payer: 59

## 2021-04-30 LAB — HM DIABETES EYE EXAM

## 2021-05-03 ENCOUNTER — Other Ambulatory Visit: Payer: 59

## 2021-05-03 ENCOUNTER — Ambulatory Visit
Admission: RE | Admit: 2021-05-03 | Discharge: 2021-05-03 | Disposition: A | Discharge status: Home / Self Care | Payer: 59 | Source: Ambulatory Visit | Attending: Internal Medicine | Admitting: Internal Medicine

## 2021-05-03 DIAGNOSIS — N2889 Other specified disorders of kidney and ureter: Secondary | ICD-10-CM

## 2021-05-05 ENCOUNTER — Encounter: Payer: Self-pay | Admitting: Endocrinology

## 2021-05-24 ENCOUNTER — Other Ambulatory Visit: Payer: Self-pay | Admitting: Sports Medicine

## 2021-07-07 ENCOUNTER — Other Ambulatory Visit: Payer: Self-pay | Admitting: Internal Medicine

## 2021-07-07 ENCOUNTER — Ambulatory Visit
Admission: RE | Admit: 2021-07-07 | Discharge: 2021-07-07 | Disposition: A | Discharge status: Home / Self Care | Payer: BC Managed Care – PPO | Source: Ambulatory Visit | Attending: Internal Medicine | Admitting: Internal Medicine

## 2021-07-07 DIAGNOSIS — J189 Pneumonia, unspecified organism: Secondary | ICD-10-CM

## 2021-07-12 ENCOUNTER — Other Ambulatory Visit: Payer: Self-pay | Admitting: Endocrinology

## 2021-08-11 ENCOUNTER — Other Ambulatory Visit: Payer: Self-pay | Admitting: Endocrinology

## 2021-08-12 ENCOUNTER — Other Ambulatory Visit: Payer: Self-pay | Admitting: Endocrinology

## 2021-08-30 ENCOUNTER — Other Ambulatory Visit (INDEPENDENT_AMBULATORY_CARE_PROVIDER_SITE_OTHER): Payer: 59

## 2021-08-30 DIAGNOSIS — E785 Hyperlipidemia, unspecified: Secondary | ICD-10-CM | POA: Diagnosis not present

## 2021-08-30 DIAGNOSIS — E119 Type 2 diabetes mellitus without complications: Secondary | ICD-10-CM

## 2021-08-30 LAB — COMPREHENSIVE METABOLIC PANEL
ALT: 46 U/L (ref 0–53)
AST: 26 U/L (ref 0–37)
Albumin: 4.1 g/dL (ref 3.5–5.2)
Alkaline Phosphatase: 47 U/L (ref 39–117)
BUN: 19 mg/dL (ref 6–23)
CO2: 29 mEq/L (ref 19–32)
Calcium: 9.1 mg/dL (ref 8.4–10.5)
Chloride: 104 mEq/L (ref 96–112)
Creatinine, Ser: 1.04 mg/dL (ref 0.40–1.50)
GFR: 74.51 mL/min (ref >60.00)
Glucose, Bld: 89 mg/dL (ref 70–99)
Potassium: 4.1 mEq/L (ref 3.5–5.1)
Sodium: 142 mEq/L (ref 135–145)
Total Bilirubin: 1 mg/dL (ref 0.2–1.2)
Total Protein: 6.5 g/dL (ref 6.0–8.3)

## 2021-08-30 LAB — LIPID PANEL
Cholesterol: 186 mg/dL (ref 0–200)
HDL: 91.3 mg/dL (ref >39.00)
LDL Cholesterol: 82 mg/dL (ref 0–99)
NonHDL: 94.48
Total CHOL/HDL Ratio: 2
Triglycerides: 62 mg/dL (ref 0.0–149.0)
VLDL: 12.4 mg/dL (ref 0.0–40.0)

## 2021-08-30 LAB — MICROALBUMIN / CREATININE URINE RATIO
Creatinine,U: 68.2 mg/dL
Microalb Creat Ratio: 1 mg/g (ref 0.0–30.0)
Microalb, Ur: <0.7 mg/dL (ref 0.0–1.9)

## 2021-08-30 LAB — HEMOGLOBIN A1C: Hgb A1c MFr Bld: 6.3 % (ref 4.6–6.5)

## 2021-09-01 DIAGNOSIS — M722 Plantar fascial fibromatosis: Secondary | ICD-10-CM | POA: Diagnosis not present

## 2021-09-02 ENCOUNTER — Ambulatory Visit (INDEPENDENT_AMBULATORY_CARE_PROVIDER_SITE_OTHER): Payer: 59 | Admitting: Endocrinology

## 2021-09-02 ENCOUNTER — Ambulatory Visit: Payer: 59 | Admitting: Endocrinology

## 2021-09-02 ENCOUNTER — Encounter: Payer: Self-pay | Admitting: Endocrinology

## 2021-09-02 VITALS — BP 106/74 | HR 84 | Ht 75.0 in | Wt 207.8 lb

## 2021-09-02 DIAGNOSIS — E119 Type 2 diabetes mellitus without complications: Secondary | ICD-10-CM | POA: Diagnosis not present

## 2021-09-02 DIAGNOSIS — I10 Essential (primary) hypertension: Secondary | ICD-10-CM | POA: Diagnosis not present

## 2021-09-02 DIAGNOSIS — E785 Hyperlipidemia, unspecified: Secondary | ICD-10-CM | POA: Diagnosis not present

## 2021-09-02 MED ORDER — EZETIMIBE 10 MG PO TABS: 10.0000 mg | ORAL_TABLET | Freq: Every day | ORAL | 1 refills | Status: DC

## 2021-09-02 NOTE — Progress Notes (Signed)
Patient ID: Cameron Cardenas, male   DOB: [birth year], 66 y.o.   MRN: [id number]   Reason for Appointment: Endocrinology follow-up      History of Present Illness   Diagnosis: Type 2 DIABETES MELITUS, date of diagnosis: 2004  PAST history: He had mild diabetes at onset and was treated with metformin and subsequently Actoplusmet In 2013 he had changed his diet significantly and started exercising. 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 now he is able to have more home-cooked meals With this he has less increase in blood sugars after meals, previously as high as 170  Taking his medications regularly Renal function stable  He says he is trying to do walking about 3-4 days a week up to 45 minutes             Side effects from medications: None  Monitors blood glucose every other day: Using meter: One Touch  Blood sugar readings as above   Dietician visit: Most recent: 12/13               Wt Readings from Last 3 Encounters:  09/02/21 207 lb 12.8 oz (94.3 kg)  03/31/21 210 lb (95.3 kg)  03/01/21 213 lb 3.2 oz (96.7 kg)   Lab Results  Component Value Date   HGBA1C 6.3 08/30/2021   HGBA1C 6.2 03/01/2021   HGBA1C 6.1 06/22/2020   Lab Results  Component Value Date   MICROALBUR <0.7 08/30/2021   LDLCALC 82 08/30/2021   CREATININE  1.04 08/30/2021   Other active problems: See review of systems  Labs:  Lab on 08/30/2021  Component Date Value Ref Range Status   Cholesterol 08/30/2021 186  0 - 200 mg/dL Final   ATP III Classification       Desirable:  < 200 mg/dL               Borderline High:  200 - 239 mg/dL          High:  > = 240 mg/dL   Triglycerides 08/30/2021 62.0  0.0 - 149.0 mg/dL Final   Normal:  <150 mg/dLBorderline High:  150 - 199 mg/dL   HDL 08/30/2021 91.30  >39.00 mg/dL Final   VLDL 08/30/2021 12.4  0.0 - 40.0 mg/dL Final   LDL Cholesterol 08/30/2021 82  0 - 99 mg/dL Final   Total CHOL/HDL Ratio 08/30/2021 2   Final                  Men          Women1/2 Average Risk     3.4          3.3Average Risk          5.0          4.42X  Average Risk          9.6          7.13X Average Risk          15.0          11.0                       NonHDL 08/30/2021 94.48   Final   NOTE:  Non-HDL goal should be 30 mg/dL higher than patient's LDL goal (i.e. LDL goal of < 70 mg/dL, would have non-HDL goal of < 100 mg/dL)   Microalb, Ur 08/30/2021 <0.7  0.0 - 1.9 mg/dL Final   Creatinine,U 08/30/2021 68.2  mg/dL Final   Microalb Creat Ratio 08/30/2021 1.0  0.0 - 30.0 mg/g Final   Sodium 08/30/2021 142  135 - 145 mEq/L Final   Potassium 08/30/2021 4.1  3.5 - 5.1 mEq/L Final   Chloride 08/30/2021 104  96 - 112 mEq/L Final   CO2 08/30/2021 29  19 - 32 mEq/L Final   Glucose, Bld 08/30/2021 89  70 - 99 mg/dL Final   BUN 08/30/2021 19  6 - 23 mg/dL Final   Creatinine, Ser 08/30/2021 1.04  0.40 - 1.50 mg/dL Final   Total Bilirubin 08/30/2021 1.0  0.2 - 1.2 mg/dL Final   Alkaline Phosphatase 08/30/2021 47  39 - 117 U/L Final   AST 08/30/2021 26  0 - 37 U/L Final   ALT 08/30/2021 46  0 - 53 U/L Final   Total Protein 08/30/2021 6.5  6.0 - 8.3 g/dL Final   Albumin 08/30/2021 4.1  3.5 - 5.2 g/dL Final   GFR 08/30/2021 74.51  >60.00 mL/min Final   Calculated using the CKD-EPI Creatinine Equation (2021)   Calcium 08/30/2021 9.1   8.4 - 10.5 mg/dL Final   Hgb A1c MFr Bld 08/30/2021 6.3  4.6 - 6.5 % Final   Glycemic Control Guidelines for People with Diabetes:Non Diabetic:  <6%Goal of Therapy: <7%Additional Action Suggested:  >8%      Allergies as of 09/02/2021       Reactions   Losartan Potassium-hctz Other (See Comments)        Medication List        Accurate as of September 02, 2021 10:51 AM. If you have any questions, ask your nurse or doctor.          amLODipine 2.5 MG tablet Commonly known as: NORVASC Take 2.5 mg by mouth daily.   aspirin 81 MG tablet Take 81 mg by mouth daily.   atorvastatin 80 MG tablet Commonly known as: LIPITOR TAKE 1 TABLET BY MOUTH EVERY DAY   econazole nitrate 1 % cream Apply topically 2 (two) times daily.   ezetimibe 10 MG tablet Commonly known as: ZETIA Take 1 tablet (10 mg total) by mouth daily.   Farxiga 5 MG Tabs tablet Generic drug: dapagliflozin propanediol TAKE 1 TABLET BY MOUTH EVERY DAY IN THE MORNING   fluticasone 50 MCG/ACT nasal spray Commonly known as: FLONASE   furosemide 20 MG tablet Commonly known as: LASIX Take 1 tablet (20 mg total) by mouth daily as needed.   glucose blood test strip 1 each by Other route as needed for other. One Touch Verio- Use to test blood sugars once daily   isosorbide mononitrate 30 MG 24 hr tablet Commonly known as: IMDUR Take 0.5 tablets (15 mg total) by mouth daily.   ketoconazole 2 % cream Commonly known as: NIZORAL as needed.  meloxicam 15 MG tablet Commonly known as: MOBIC TAKE 1 TABLET (15 MG TOTAL) BY MOUTH DAILY.   metFORMIN 500 MG 24 hr tablet Commonly known as: GLUCOPHAGE-XR TAKE 1 TABLET BY MOUTH TWICE A DAY   metoprolol tartrate 100 MG tablet Commonly known as: LOPRESSOR Take 1 tablet (100 mg total) by mouth once for 1 dose. Please take one time dose 147m metoprolol tartrate 2 hr prior to cardiac CT for HR control IF HR >55bpm.   olmesartan-hydrochlorothiazide 20-12.5 MG  tablet Commonly known as: BENICAR HCT Take 1 tablet by mouth daily. Take 1 tablet by mouth once daily.   OneTouch Delica Lancets 386LMisc Use to test blood sugars once daily   OneTouch Verio w/Device Kit Use to check blood sugars once daily   pioglitazone 30 MG tablet Commonly known as: ACTOS TAKE 1 TABLET BY MOUTH EVERY DAY   sildenafil 20 MG tablet Commonly known as: REVATIO Take 2-3 tablets by mouth as needed, as directed Discontinue if having headaches.   triamcinolone cream 0.5 % Commonly known as: KENALOG as needed.        Allergies:  Allergies  Allergen Reactions   Losartan Potassium-Hctz Other (See Comments)    Past Medical History:  Diagnosis Date   Allergic rhinitis due to pollen    Anal fissure    Bilateral sciatica 07/09/2018   Chronic pain syndrome 07/09/2018   Chronic, continuous use of opioids 07/09/2018   DDD (degenerative disc disease), lumbar    Essential hypertension, malignant    Hemorrhoids    Lumbago    Lumbar post-laminectomy syndrome 07/09/2018   Plantar fasciitis of left foot    Pure hypercholesterolemia    Spinal stenosis of lumbar region 07/09/2018   Type II or unspecified type diabetes mellitus without mention of complication, not stated as uncontrolled    dx in 2005   Unspecified vitamin D deficiency     Past Surgical History:  Procedure Laterality Date   [facility] in [facility], does not recall the name of facility. Does belive polyps were removed   [facility]   total of 3 sx on back per pt   MAXIMUM ACCESS (MAS)POSTERIOR LUMBAR INTERBODY FUSION (PLIF) 1 LEVEL N/A 06/10/2014   Procedure: Redo Lumbar Five-Sacral One Decompression with maximum access posterior lumbar interbody fusion;  Surgeon: JErline Levine MD;  [facility];  Service: Neurosurgery;  Laterality: N/A;  Redo Decompression with maximum access posterior lumbar interbody fusion, L5-S1    Family History  Problem Relation  Age of Onset   Hypertension Mother    Kidney disease Mother    Diabetes Mother    Hypertension Father    Diabetes Sister    Cataracts Sister    Seizures Brother        caused his death   Colon cancer Neg Hx    Esophageal cancer Neg Hx    Rectal cancer Neg Hx    Stomach cancer Neg Hx    Heart disease Neg Hx    Colon polyps Neg Hx     Social History:  reports that he quit smoking about 2 years ago. His smoking use included cigars. He has never used smokeless tobacco. He reports that he does not currently use alcohol. He reports that he does not currently use drugs after having used the following drugs: Marijuana.   Lab on 08/30/2021  Component Date Value Ref Range Status   Cholesterol 08/30/2021 186  0 - 200 mg/dL Final   ATP III Classification       Desirable:  < 200 mg/dL               Borderline High:  200 - 239 mg/dL          High:  > = 240 mg/dL   Triglycerides 08/30/2021 62.0  0.0 - 149.0 mg/dL Final   Normal:  <150 mg/dLBorderline High:  150 - 199 mg/dL   HDL 08/30/2021 91.30  >39.00 mg/dL Final   VLDL 08/30/2021 12.4  0.0 - 40.0 mg/dL Final   LDL Cholesterol 08/30/2021 82  0 - 99 mg/dL Final   Total CHOL/HDL Ratio 08/30/2021 2   Final                  Men          Women1/2 Average Risk     3.4          3.3Average Risk          5.0          4.42X Average Risk          9.6          7.13X Average Risk          15.0          11.0                       NonHDL 08/30/2021 94.48   Final   NOTE:  Non-HDL goal should be 30 mg/dL higher than patient's LDL goal (i.e. LDL goal of < 70 mg/dL, would have non-HDL goal of < 100 mg/dL)   Microalb, Ur 08/30/2021 <0.7  0.0 - 1.9 mg/dL Final   Creatinine,U 08/30/2021 68.2  mg/dL Final   Microalb Creat Ratio 08/30/2021 1.0  0.0 - 30.0 mg/g Final   Sodium 08/30/2021 142  135 - 145 mEq/L Final   Potassium 08/30/2021 4.1  3.5 - 5.1 mEq/L Final   Chloride 08/30/2021 104  96 - 112 mEq/L Final   CO2 08/30/2021 29  19 - 32 mEq/L Final   Glucose, Bld  08/30/2021 89  70 - 99 mg/dL Final   BUN 08/30/2021 19  6 - 23 mg/dL Final   Creatinine, Ser 08/30/2021 1.04  0.40 - 1.50 mg/dL Final   Total Bilirubin 08/30/2021 1.0  0.2 - 1.2 mg/dL Final   Alkaline Phosphatase 08/30/2021 47  39 - 117 U/L Final   AST 08/30/2021 26  0 - 37 U/L Final   ALT 08/30/2021 46  0 - 53 U/L Final   Total Protein 08/30/2021 6.5  6.0 - 8.3 g/dL Final   Albumin 08/30/2021 4.1  3.5 - 5.2 g/dL Final   GFR 08/30/2021 74.51  >60.00 mL/min Final   Calculated using the CKD-EPI Creatinine Equation (2021)   Calcium 08/30/2021 9.1  8.4 - 10.5 mg/dL Final   Hgb A1c MFr Bld 08/30/2021 6.3  4.6 - 6.5 % Final   Glycemic Control Guidelines for People with Diabetes:Non Diabetic:  <6%Goal of Therapy: <7%Additional Action Suggested:  >8%     Review of Systems:  HYPERTENSION:   Treated with Benicar HCT, followed by PCP He has also been given 5 mg amlodipine  He is monitoring at home and blood pressure readings are about 073 systolic at home also  BP Readings from Last 3 Encounters:  09/02/21 106/74  03/31/21 124/67  03/01/21 132/84   Urine microalbumin normal as of  8/23   Renal function back to normal now  Lab Results  Component Value Date   CREATININE 1.04 08/30/2021   CREATININE 1.31 03/01/2021   CREATININE 1.46 (H) 07/02/2020     HYPERLIPIDEMIA: The lipid abnormality consists of elevated LDL  He had been on 80 mg atorvastatin since 6/16 He does not know when he last has taken this and has not followed up with a cardiologist Previously did have LDL particle number of 1348 previously and which was relatively high at 1216 in 6/16  Although he thinks he is taking his Zetia there is no refill showing up on the home medication list recently Lipitor appears to be regularly failed LDL is back to below 100  Labs as follows:    Lab Results  Component Value Date   CHOL 186 08/30/2021   CHOL 236 (H) 03/01/2021   CHOL 169 03/17/2020   Lab Results  Component  Value Date   HDL 91.30 08/30/2021   HDL 89.90 03/01/2021   HDL 90.10 03/17/2020   Lab Results  Component Value Date   LDLCALC 82 08/30/2021   LDLCALC 131 (H) 03/01/2021   LDLCALC 67 03/17/2020   Lab Results  Component Value Date   TRIG 62.0 08/30/2021   TRIG 76.0 03/01/2021   TRIG 59.0 03/17/2020   Lab Results  Component Value Date   CHOLHDL 2 08/30/2021   CHOLHDL 3 03/01/2021   CHOLHDL 2 03/17/2020   No results found for: "LDLDIRECT"  Lab Results  Component Value Date   CHOL 186 08/30/2021   HDL 91.30 08/30/2021   LDLCALC 82 08/30/2021   TRIG 62.0 08/30/2021   CHOLHDL 2 08/30/2021    Foot exam normal     Examination:   BP 106/74   Pulse 84   Ht _0  (1.905 m)   Wt 207 lb 12.8 oz (94.3 kg)   SpO2 96%   BMI 25.97 kg/m   Body mass index is 25.97 kg/m.      ASSESSMENT/ PLAN:   Diabetes type 2, nonobese  He has had mild diabetes with consistently good A1c levels in the normal range  He has been on Farxiga 5 mg, Actos 30 mg and Metformin 1 g daily   A1c is stable at 6.3  Recently has done better with lifestyle with eating more at home and continuing walking He thinks blood sugars are fairly close to normal at home and below 120 fasting Has been compliant with medications Has not had any progression of his diabetes for the last several years   Recommendations: Regular walking for exercise to be continued Discussed blood sugar levels for before and after meals and to CHEK2 hours after eating and bring the monitor for download on each visit He will stay on metformin and Farxiga unchanged  HYPERTENSION:  Now better controlled especially with likely eating out less However since blood pressure is low normal he can cut the amlodipine in half Continue monitoring at home also  LIPIDS: Now much better controlled as he has gone back to taking Zetia and Lipitor regularly Have sent a refill in case he does not have any refills for Zetia  There are no  Patient Instructions on file for this visit.  Elayne Snare 09/02/2021, 10:51 AM     Note: This office note was prepared with Dragon voice recognition system technology. Any transcriptional errors that result from this process are unintentional.

## 2021-09-02 NOTE — Patient Instructions (Addendum)
Take 1/2 amlodipine daily  Refill Ezetemibe

## 2021-10-11 DIAGNOSIS — B351 Tinea unguium: Secondary | ICD-10-CM | POA: Diagnosis not present

## 2021-10-27 DIAGNOSIS — E119 Type 2 diabetes mellitus without complications: Secondary | ICD-10-CM | POA: Diagnosis not present

## 2021-10-27 DIAGNOSIS — H26491 Other secondary cataract, right eye: Secondary | ICD-10-CM | POA: Diagnosis not present

## 2021-10-27 DIAGNOSIS — H02883 Meibomian gland dysfunction of right eye, unspecified eyelid: Secondary | ICD-10-CM | POA: Diagnosis not present

## 2021-10-27 DIAGNOSIS — H02886 Meibomian gland dysfunction of left eye, unspecified eyelid: Secondary | ICD-10-CM | POA: Diagnosis not present

## 2021-10-29 DIAGNOSIS — E1165 Type 2 diabetes mellitus with hyperglycemia: Secondary | ICD-10-CM | POA: Diagnosis not present

## 2021-10-29 DIAGNOSIS — E782 Mixed hyperlipidemia: Secondary | ICD-10-CM | POA: Diagnosis not present

## 2021-10-29 DIAGNOSIS — M778 Other enthesopathies, not elsewhere classified: Secondary | ICD-10-CM | POA: Diagnosis not present

## 2021-10-29 DIAGNOSIS — D649 Anemia, unspecified: Secondary | ICD-10-CM | POA: Diagnosis not present

## 2022-01-21 DIAGNOSIS — R351 Nocturia: Secondary | ICD-10-CM | POA: Diagnosis not present

## 2022-01-21 DIAGNOSIS — N3941 Urge incontinence: Secondary | ICD-10-CM | POA: Diagnosis not present

## 2022-01-21 DIAGNOSIS — M722 Plantar fascial fibromatosis: Secondary | ICD-10-CM | POA: Diagnosis not present

## 2022-01-21 DIAGNOSIS — Z125 Encounter for screening for malignant neoplasm of prostate: Secondary | ICD-10-CM | POA: Diagnosis not present

## 2022-01-21 DIAGNOSIS — N401 Enlarged prostate with lower urinary tract symptoms: Secondary | ICD-10-CM | POA: Diagnosis not present

## 2022-02-03 DIAGNOSIS — H02886 Meibomian gland dysfunction of left eye, unspecified eyelid: Secondary | ICD-10-CM | POA: Diagnosis not present

## 2022-02-03 DIAGNOSIS — E119 Type 2 diabetes mellitus without complications: Secondary | ICD-10-CM | POA: Diagnosis not present

## 2022-02-03 DIAGNOSIS — H02883 Meibomian gland dysfunction of right eye, unspecified eyelid: Secondary | ICD-10-CM | POA: Diagnosis not present

## 2022-02-03 DIAGNOSIS — H26491 Other secondary cataract, right eye: Secondary | ICD-10-CM | POA: Diagnosis not present

## 2022-02-07 ENCOUNTER — Other Ambulatory Visit: Payer: Self-pay | Admitting: Endocrinology

## 2022-02-21 DIAGNOSIS — H02886 Meibomian gland dysfunction of left eye, unspecified eyelid: Secondary | ICD-10-CM | POA: Diagnosis not present

## 2022-02-21 DIAGNOSIS — H0100A Unspecified blepharitis right eye, upper and lower eyelids: Secondary | ICD-10-CM | POA: Diagnosis not present

## 2022-02-21 DIAGNOSIS — H02883 Meibomian gland dysfunction of right eye, unspecified eyelid: Secondary | ICD-10-CM | POA: Diagnosis not present

## 2022-02-21 DIAGNOSIS — B880 Other acariasis: Secondary | ICD-10-CM | POA: Diagnosis not present

## 2022-02-23 ENCOUNTER — Other Ambulatory Visit: Payer: Self-pay | Admitting: Endocrinology

## 2022-02-24 DIAGNOSIS — Z125 Encounter for screening for malignant neoplasm of prostate: Secondary | ICD-10-CM | POA: Diagnosis not present

## 2022-02-24 DIAGNOSIS — E782 Mixed hyperlipidemia: Secondary | ICD-10-CM | POA: Diagnosis not present

## 2022-02-24 DIAGNOSIS — E1165 Type 2 diabetes mellitus with hyperglycemia: Secondary | ICD-10-CM | POA: Diagnosis not present

## 2022-02-24 DIAGNOSIS — I10 Essential (primary) hypertension: Secondary | ICD-10-CM | POA: Diagnosis not present

## 2022-03-03 DIAGNOSIS — Z0000 Encounter for general adult medical examination without abnormal findings: Secondary | ICD-10-CM | POA: Diagnosis not present

## 2022-03-03 DIAGNOSIS — E782 Mixed hyperlipidemia: Secondary | ICD-10-CM | POA: Diagnosis not present

## 2022-03-03 DIAGNOSIS — E1165 Type 2 diabetes mellitus with hyperglycemia: Secondary | ICD-10-CM | POA: Diagnosis not present

## 2022-03-03 DIAGNOSIS — N182 Chronic kidney disease, stage 2 (mild): Secondary | ICD-10-CM | POA: Diagnosis not present

## 2022-03-04 DIAGNOSIS — R972 Elevated prostate specific antigen [PSA]: Secondary | ICD-10-CM | POA: Diagnosis not present

## 2022-03-09 ENCOUNTER — Other Ambulatory Visit (INDEPENDENT_AMBULATORY_CARE_PROVIDER_SITE_OTHER): Payer: BC Managed Care – PPO

## 2022-03-09 DIAGNOSIS — E785 Hyperlipidemia, unspecified: Secondary | ICD-10-CM

## 2022-03-09 DIAGNOSIS — E119 Type 2 diabetes mellitus without complications: Secondary | ICD-10-CM | POA: Diagnosis not present

## 2022-03-09 LAB — BASIC METABOLIC PANEL
BUN: 18 mg/dL (ref 6–23)
CO2: 32 mEq/L (ref 19–32)
Calcium: 9.8 mg/dL (ref 8.4–10.5)
Chloride: 104 mEq/L (ref 96–112)
Creatinine, Ser: 1.11 mg/dL (ref 0.40–1.50)
GFR: 68.66 mL/min (ref >60.00)
Glucose, Bld: 62 mg/dL — ABNORMAL LOW (ref 70–99)
Potassium: 3.9 mEq/L (ref 3.5–5.1)
Sodium: 141 mEq/L (ref 135–145)

## 2022-03-09 LAB — HEMOGLOBIN A1C: Hgb A1c MFr Bld: 6.1 % (ref 4.6–6.5)

## 2022-03-09 LAB — LDL CHOLESTEROL, DIRECT: Direct LDL: 68 mg/dL

## 2022-03-11 ENCOUNTER — Encounter: Payer: Self-pay | Admitting: Endocrinology

## 2022-03-11 ENCOUNTER — Ambulatory Visit (INDEPENDENT_AMBULATORY_CARE_PROVIDER_SITE_OTHER): Payer: BC Managed Care – PPO | Admitting: Endocrinology

## 2022-03-11 VITALS — BP 136/94 | HR 73 | Ht 75.0 in | Wt 217.6 lb

## 2022-03-11 DIAGNOSIS — N401 Enlarged prostate with lower urinary tract symptoms: Secondary | ICD-10-CM | POA: Diagnosis not present

## 2022-03-11 DIAGNOSIS — N3941 Urge incontinence: Secondary | ICD-10-CM | POA: Diagnosis not present

## 2022-03-11 DIAGNOSIS — R972 Elevated prostate specific antigen [PSA]: Secondary | ICD-10-CM | POA: Diagnosis not present

## 2022-03-11 DIAGNOSIS — R8271 Bacteriuria: Secondary | ICD-10-CM | POA: Diagnosis not present

## 2022-03-11 DIAGNOSIS — I10 Essential (primary) hypertension: Secondary | ICD-10-CM | POA: Diagnosis not present

## 2022-03-11 DIAGNOSIS — R351 Nocturia: Secondary | ICD-10-CM | POA: Diagnosis not present

## 2022-03-11 DIAGNOSIS — E119 Type 2 diabetes mellitus without complications: Secondary | ICD-10-CM

## 2022-03-11 DIAGNOSIS — E785 Hyperlipidemia, unspecified: Secondary | ICD-10-CM

## 2022-03-11 MED ORDER — FARXIGA 5 MG PO TABS
ORAL_TABLET | ORAL | 5 refills | Status: DC
Start: 2022-03-11 — End: 2022-05-25

## 2022-03-11 MED ORDER — EZETIMIBE 10 MG PO TABS: 10.0000 mg | ORAL_TABLET | Freq: Every day | ORAL | 1 refills | Status: DC

## 2022-03-11 NOTE — Patient Instructions (Signed)
Check blood sugars on waking up days a week  Also check blood sugars about 2 hours after meals and do this after different meals by rotation  Recommended blood sugar levels on waking up are 90-130 and about 2 hours after meal is 130-160  Please bring your blood sugar monitor to each visit, thank you   

## 2022-03-11 NOTE — Progress Notes (Unsigned)
Patient ID: Cameron Cardenas, male   DOB: December 22, 1955, 67 y.o.   MRN: [id number]   Reason for Appointment: Endocrinology follow-up      History of Present Illness   Diagnosis: Type 2 DIABETES MELITUS, date of diagnosis: 2004  PAST history: He had mild diabetes at onset and was treated with metformin and subsequently Actoplusmet In 2013 he had changed his diet significantly and started exercising. 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 now he is able to have more home-cooked meals With this he has less increase in blood sugars after meals, previously as high as 170  Taking his medications regularly Renal function stable  He says he is trying to do walking about 3-4 days a week up to 45 minutes             Side effects from medications: None  Monitors blood glucose every other day: Using meter: One Touch  Blood sugar readings 134 pc   Dietician visit: Most recent: 12/13               Wt Readings from Last 3 Encounters:  03/11/22 217 lb 9.6 oz (98.7 kg)  09/02/21 207 lb 12.8 oz (94.3 kg)  03/31/21 210 lb (95.3 kg)   Lab Results  Component Value Date   HGBA1C 6.1 03/09/2022   HGBA1C 6.3 08/30/2021   HGBA1C 6.2 03/01/2021   Lab Results  Component Value Date   MICROALBUR <0.7 08/30/2021   LDLCALC 82 08/30/2021   CREATININE 1.11  03/09/2022   Other active problems: See review of systems  Labs:  Lab on 03/09/2022  Component Date Value Ref Range Status   Direct LDL 03/09/2022 68.0  mg/dL Final   Optimal:  <100 mg/dLNear or Above Optimal:  100-129 mg/dLBorderline High:  130-159 mg/dLHigh:  160-189 mg/dLVery High:  >190 mg/dL   Sodium 03/09/2022 141  135 - 145 mEq/L Final   Potassium 03/09/2022 3.9  3.5 - 5.1 mEq/L Final   Chloride 03/09/2022 104  96 - 112 mEq/L Final   CO2 03/09/2022 32  19 - 32 mEq/L Final   Glucose, Bld 03/09/2022 62 (L)  70 - 99 mg/dL Final   BUN 03/09/2022 18  6 - 23 mg/dL Final   Creatinine, Ser 03/09/2022 1.11  0.40 - 1.50 mg/dL Final   GFR 03/09/2022 68.66  >60.00 mL/min Final   Calculated using the CKD-EPI Creatinine Equation (2021)   Calcium 03/09/2022 9.8  8.4 - 10.5 mg/dL Final   Hgb A1c MFr Bld 03/09/2022 6.1  4.6 - 6.5 % Final   Glycemic Control Guidelines for People with Diabetes:Non Diabetic:  <6%Goal of Therapy: <7%Additional Action Suggested:  >8%      Allergies as of 03/11/2022       Reactions   Losartan Potassium-hctz Other (  See Comments)        Medication List        Accurate as of March 11, 2022 10:39 AM. If you have any questions, ask your nurse or doctor.          STOP taking these medications    metoprolol tartrate 100 MG tablet Commonly known as: LOPRESSOR Stopped by: Elayne Snare, MD       TAKE these medications    amLODipine 2.5 MG tablet Commonly known as: NORVASC Take 2.5 mg by mouth daily.   aspirin 81 MG tablet Take 81 mg by mouth daily.   atorvastatin 80 MG tablet Commonly known as: LIPITOR TAKE 1 TABLET BY MOUTH EVERY DAY   econazole nitrate 1 % cream Apply topically 2 (two) times daily.   ezetimibe 10 MG tablet Commonly known as: ZETIA Take 1 tablet (10 mg total) by mouth daily.   Farxiga 5 MG Tabs tablet Generic drug: dapagliflozin propanediol TAKE 1 TABLET BY MOUTH EVERY DAY IN THE MORNING   fluticasone 50 MCG/ACT nasal  spray Commonly known as: FLONASE   furosemide 20 MG tablet Commonly known as: LASIX Take 1 tablet (20 mg total) by mouth daily as needed.   glucose blood test strip 1 each by Other route as needed for other. One Touch Verio- Use to test blood sugars once daily   isosorbide mononitrate 30 MG 24 hr tablet Commonly known as: IMDUR Take 0.5 tablets (15 mg total) by mouth daily.   ketoconazole 2 % cream Commonly known as: NIZORAL as needed.   meloxicam 15 MG tablet Commonly known as: MOBIC TAKE 1 TABLET (15 MG TOTAL) BY MOUTH DAILY.   metFORMIN 500 MG 24 hr tablet Commonly known as: GLUCOPHAGE-XR TAKE 1 TABLET BY MOUTH TWICE A DAY   olmesartan-hydrochlorothiazide 20-12.5 MG tablet Commonly known as: BENICAR HCT Take 1 tablet by mouth daily. Take 1 tablet by mouth once daily.   OneTouch Delica Lancets 99991111 Misc Use to test blood sugars once daily   OneTouch Verio w/Device Kit Use to check blood sugars once daily   pioglitazone 30 MG tablet Commonly known as: ACTOS TAKE 1 TABLET BY MOUTH EVERY DAY   sildenafil 20 MG tablet Commonly known as: REVATIO Take 2-3 tablets by mouth as needed, as directed Discontinue if having headaches.   triamcinolone cream 0.5 % Commonly known as: KENALOG as needed.        Allergies:  Allergies  Allergen Reactions   Losartan Potassium-Hctz Other (See Comments)    Past Medical History:  Diagnosis Date   Allergic rhinitis due to pollen    Anal fissure    Bilateral sciatica 07/09/2018   Chronic pain syndrome 07/09/2018   Chronic, continuous use of opioids 07/09/2018   DDD (degenerative disc disease), lumbar    Essential hypertension, malignant    Hemorrhoids    Lumbago    Lumbar post-laminectomy syndrome 07/09/2018   Plantar fasciitis of left foot    Pure hypercholesterolemia    Spinal stenosis of lumbar region 07/09/2018   Type II or unspecified type diabetes mellitus without mention of complication, not stated as uncontrolled     dx in 2005   Unspecified vitamin D deficiency     Past Surgical History:  Procedure Laterality Date   COLONOSCOPY     in [facility], MD no longer in practice, does not recall the name of facility. Does belive polyps were removed   [facility] [facility]  2011   total of 3 sx on back  per pt   MAXIMUM ACCESS (MAS)POSTERIOR LUMBAR INTERBODY FUSION (PLIF) 1 LEVEL N/A 06/10/2014   Procedure: Redo Lumbar Five-Sacral One Decompression with maximum access posterior lumbar interbody fusion;  Surgeon: Erline Levine, MD;  Location: [place] [facility];  Service: Neurosurgery;  Laterality: N/A;  Redo Decompression with maximum access posterior lumbar interbody fusion, L5-S1    Family History  Problem Relation Age of Onset   Hypertension Mother    Kidney disease Mother    Diabetes Mother    Hypertension Father    Diabetes Sister    Cataracts Sister    Seizures Brother        caused his death   Colon cancer Neg Hx    Esophageal cancer Neg Hx    Rectal cancer Neg Hx    Stomach cancer Neg Hx    Heart disease Neg Hx    Colon polyps Neg Hx     Social History:  reports that he quit smoking about 3 years ago. His smoking use included cigars. He has never used smokeless tobacco. He reports that he does not currently use alcohol. He reports that he does not currently use drugs after having used the following drugs: Marijuana.   Lab on 03/09/2022  Component Date Value Ref Range Status   Direct LDL 03/09/2022 68.0  mg/dL Final   Optimal:  <100 mg/dLNear or Above Optimal:  100-129 mg/dLBorderline High:  130-159 mg/dLHigh:  160-189 mg/dLVery High:  >190 mg/dL   Sodium 03/09/2022 141  135 - 145 mEq/L Final   Potassium 03/09/2022 3.9  3.5 - 5.1 mEq/L Final   Chloride 03/09/2022 104  96 - 112 mEq/L Final   CO2 03/09/2022 32  19 - 32 mEq/L Final   Glucose, Bld 03/09/2022 62 (L)  70 - 99 mg/dL Final   BUN 03/09/2022 18  6 - 23 mg/dL Final   Creatinine, Ser 03/09/2022 1.11  0.40 - 1.50 mg/dL Final   GFR 03/09/2022 68.66   >60.00 mL/min Final   Calculated using the CKD-EPI Creatinine Equation (2021)   Calcium 03/09/2022 9.8  8.4 - 10.5 mg/dL Final   Hgb A1c MFr Bld 03/09/2022 6.1  4.6 - 6.5 % Final   Glycemic Control Guidelines for People with Diabetes:Non Diabetic:  <6%Goal of Therapy: <7%Additional Action Suggested:  >8%     Review of Systems:  HYPERTENSION:   Treated with Benicar HCT, followed by PCP He has also been given 5 mg amlodipine  He is monitoring at home and blood pressure readings are about   BP Readings from Last 3 Encounters:  03/11/22 (!) 136/94  09/02/21 106/74  03/31/21 124/67   Urine microalbumin normal as of 8/23   Renal function back to normal now  Lab Results  Component Value Date   CREATININE 1.11 03/09/2022   CREATININE 1.04 08/30/2021   CREATININE 1.31 03/01/2021     HYPERLIPIDEMIA: The lipid abnormality consists of elevated LDL  He had been on 80 mg atorvastatin since 6/16 He does not know when he last has taken this and has not followed up with a cardiologist Previously did have LDL particle number of 1348 previously and which was relatively high at 1216 in 6/16  Although he thinks he is taking his Zetia there is no refill showing up on the home medication list recently Lipitor appears to be regularly   LDL is 68  Labs as follows:    Lab Results  Component Value Date   CHOL 186 08/30/2021   CHOL 236 (H) 03/01/2021  CHOL 169 03/17/2020   Lab Results  Component Value Date   HDL 91.30 08/30/2021   HDL 89.90 03/01/2021   HDL 90.10 03/17/2020   Lab Results  Component Value Date   LDLCALC 82 08/30/2021   LDLCALC 131 (H) 03/01/2021   LDLCALC 67 03/17/2020   Lab Results  Component Value Date   TRIG 62.0 08/30/2021   TRIG 76.0 03/01/2021   TRIG 59.0 03/17/2020   Lab Results  Component Value Date   CHOLHDL 2 08/30/2021   CHOLHDL 3 03/01/2021   CHOLHDL 2 03/17/2020   Lab Results  Component Value Date   LDLDIRECT 68.0 03/09/2022    Lab  Results  Component Value Date   CHOL 186 08/30/2021   HDL 91.30 08/30/2021   LDLCALC 82 08/30/2021   LDLDIRECT 68.0 03/09/2022   TRIG 62.0 08/30/2021   CHOLHDL 2 08/30/2021       Examination:   BP (!) 136/94 (BP Location: Left Arm, Patient Position: Sitting, Cuff Size: Normal)   Pulse 73   Ht '6\' 3"'$  (1.905 m)   Wt 217 lb 9.6 oz (98.7 kg)   SpO2 98%   BMI 27.20 kg/m   Body mass index is 27.2 kg/m.      ASSESSMENT/ PLAN:   Diabetes type 2, nonobese  He has had mild diabetes with consistently good A1c levels in the normal range  He has been on Farxiga 5 mg, Actos 30 mg and Metformin 1 g daily   A1c is stable at 6.3  Recently has done better with lifestyle with eating more at home and continuing walking He thinks blood sugars are fairly close to normal at home and below 120 fasting Has been compliant with medications Has not had any progression of his diabetes for the last several years   Recommendations: Regular walking for exercise to be continued Discussed blood sugar levels for before and after meals and to CHEK2 hours after eating and bring the monitor for download on each visit He will stay on metformin and Farxiga unchanged  HYPERTENSION:  Now better controlled especially with likely eating out less However since blood pressure is low normal he can cut the amlodipine in half Continue monitoring at home also  LIPIDS: Now much better controlled as he has gone back to taking Zetia and Lipitor regularly Have sent a refill in case he does not have any refills for Zetia  There are no Patient Instructions on file for this visit.  Elayne Snare 03/11/2022, 10:39 AM     Note: This office note was prepared with Dragon voice recognition system technology. Any transcriptional errors that result from this process are unintentional.

## 2022-03-17 ENCOUNTER — Other Ambulatory Visit: Payer: Self-pay | Admitting: Urology

## 2022-03-17 DIAGNOSIS — R972 Elevated prostate specific antigen [PSA]: Secondary | ICD-10-CM

## 2022-03-17 DIAGNOSIS — R1310 Dysphagia, unspecified: Secondary | ICD-10-CM | POA: Diagnosis not present

## 2022-03-31 ENCOUNTER — Telehealth: Payer: Self-pay | Admitting: Endocrinology

## 2022-03-31 DIAGNOSIS — E119 Type 2 diabetes mellitus without complications: Secondary | ICD-10-CM

## 2022-03-31 MED ORDER — ONETOUCH VERIO W/DEVICE KIT: PACK | 0 refills | Status: DC

## 2022-03-31 MED ORDER — GLUCOSE BLOOD VI STRP: 1.0000 | ORAL_STRIP | 0 refills | Status: DC | PRN

## 2022-03-31 MED ORDER — ONETOUCH ULTRASOFT LANCETS MISC: 12 refills | Status: DC

## 2022-03-31 NOTE — Telephone Encounter (Signed)
RX now sent to pharmacy 

## 2022-03-31 NOTE — Telephone Encounter (Signed)
MEDICATION: 1)  glucose blood glucose blood test strip  2)OneTouch Delica Lancets 99991111 OneTouch Delica Lancets 99991111 MISC  3) OneTouch Verio Blood Glucose Monitoring Suppl (ONETOUCH VERIO) w/Device KIT  PHARMACY:   [id number] - [place] - [address] (Ph: [phone])     HAS THE PATIENT CONTACTED THEIR PHARMACY?  Yes  IS THIS A 90 DAY SUPPLY : Yes  IS PATIENT OUT OF MEDICATION: Yes  IF NOT; HOW MUCH IS LEFT:   LAST APPOINTMENT DATE: @3 /01/2022  NEXT APPOINTMENT DATE:@Visit  date not found  DO WE HAVE YOUR PERMISSION TO LEAVE A DETAILED MESSAGE?: Yes  OTHER COMMENTS: Patient is out of all supplies.   **Let patient know to contact pharmacy at the end of the day to make sure medication is ready. **  ** Please notify patient to allow 48-72 hours to process**  **Encourage patient to contact the pharmacy for refills or they can request refills through [facility]**

## 2022-04-04 DIAGNOSIS — H02886 Meibomian gland dysfunction of left eye, unspecified eyelid: Secondary | ICD-10-CM | POA: Diagnosis not present

## 2022-04-04 DIAGNOSIS — B880 Other acariasis: Secondary | ICD-10-CM | POA: Diagnosis not present

## 2022-04-04 DIAGNOSIS — H0100A Unspecified blepharitis right eye, upper and lower eyelids: Secondary | ICD-10-CM | POA: Diagnosis not present

## 2022-04-04 DIAGNOSIS — H02883 Meibomian gland dysfunction of right eye, unspecified eyelid: Secondary | ICD-10-CM | POA: Diagnosis not present

## 2022-04-08 ENCOUNTER — Encounter: Payer: Self-pay | Admitting: Urology

## 2022-04-12 ENCOUNTER — Encounter: Payer: Self-pay | Admitting: Internal Medicine

## 2022-04-16 ENCOUNTER — Other Ambulatory Visit: Payer: Self-pay | Admitting: Urology

## 2022-04-16 ENCOUNTER — Ambulatory Visit
Admission: RE | Admit: 2022-04-16 | Discharge: 2022-04-16 | Disposition: A | Discharge status: Home / Self Care | Payer: 59 | Source: Ambulatory Visit | Attending: Urology | Admitting: Urology

## 2022-04-16 DIAGNOSIS — R972 Elevated prostate specific antigen [PSA]: Secondary | ICD-10-CM

## 2022-04-16 MED ORDER — GADOPICLENOL 0.5 MMOL/ML IV SOLN: 10.0000 mL | Freq: Once | INTRAVENOUS | Status: DC | PRN

## 2022-04-19 ENCOUNTER — Other Ambulatory Visit: Payer: Self-pay | Admitting: Endocrinology

## 2022-04-19 DIAGNOSIS — E119 Type 2 diabetes mellitus without complications: Secondary | ICD-10-CM

## 2022-04-24 MED ORDER — GADOPICLENOL 0.5 MMOL/ML IV SOLN
10.0000 mL | Freq: Once | INTRAVENOUS | Status: AC | PRN
  Administered 2022-04-24: 10 mL via INTRAVENOUS

## 2022-04-25 NOTE — Telephone Encounter (Signed)
Fax received from [facility] - placed in Dr Remus Blake box at front desk

## 2022-04-26 ENCOUNTER — Other Ambulatory Visit: Payer: Self-pay

## 2022-04-26 DIAGNOSIS — E119 Type 2 diabetes mellitus without complications: Secondary | ICD-10-CM

## 2022-04-26 MED ORDER — ONETOUCH ULTRASOFT LANCETS MISC
12 refills | Status: AC
Start: 2022-04-26 — End: ?

## 2022-04-26 MED ORDER — ONETOUCH VERIO W/DEVICE KIT
PACK | 0 refills | Status: DC
Start: 2022-04-26 — End: 2022-04-26

## 2022-04-26 MED ORDER — ONETOUCH ULTRASOFT LANCETS MISC
12 refills | Status: DC
Start: 2022-04-26 — End: 2022-04-26

## 2022-04-26 MED ORDER — ONETOUCH VERIO W/DEVICE KIT: PACK | 0 refills | Status: AC

## 2022-04-26 MED ORDER — ONETOUCH VERIO VI STRP
ORAL_STRIP | 3 refills | Status: AC
Start: 2022-04-26 — End: ?

## 2022-04-26 MED ORDER — ONETOUCH DELICA LANCETS 33G MISC
3 refills | Status: AC
Start: 2022-04-26 — End: ?

## 2022-04-26 MED ORDER — ONETOUCH VERIO VI STRP: ORAL_STRIP | 12 refills | Status: DC

## 2022-04-26 MED ORDER — ONETOUCH VERIO VI STRP
ORAL_STRIP | 3 refills | Status: DC
Start: 2022-04-26 — End: 2022-04-26

## 2022-04-26 MED ORDER — ONETOUCH DELICA LANCETS 33G MISC
3 refills | Status: DC
Start: 2022-04-26 — End: 2022-04-26

## 2022-04-26 NOTE — Telephone Encounter (Signed)
Patient is calling again to check on the status of his request.  Patient states that if he does not hear anything by tomorrow then he will file a complaint with [facility].

## 2022-04-26 NOTE — Telephone Encounter (Signed)
Can you sign pending order for this patient . I have tried to print out 3 times and can't find where it's going. Patient needs this today.

## 2022-05-03 DIAGNOSIS — R972 Elevated prostate specific antigen [PSA]: Secondary | ICD-10-CM | POA: Diagnosis not present

## 2022-05-03 DIAGNOSIS — D400 Neoplasm of uncertain behavior of prostate: Secondary | ICD-10-CM | POA: Diagnosis not present

## 2022-05-05 DIAGNOSIS — N400 Enlarged prostate without lower urinary tract symptoms: Secondary | ICD-10-CM | POA: Diagnosis not present

## 2022-05-05 DIAGNOSIS — R1032 Left lower quadrant pain: Secondary | ICD-10-CM | POA: Diagnosis not present

## 2022-05-05 DIAGNOSIS — R972 Elevated prostate specific antigen [PSA]: Secondary | ICD-10-CM | POA: Diagnosis not present

## 2022-05-09 DIAGNOSIS — H11823 Conjunctivochalasis, bilateral: Secondary | ICD-10-CM | POA: Diagnosis not present

## 2022-05-09 DIAGNOSIS — H02883 Meibomian gland dysfunction of right eye, unspecified eyelid: Secondary | ICD-10-CM | POA: Diagnosis not present

## 2022-05-09 DIAGNOSIS — H109 Unspecified conjunctivitis: Secondary | ICD-10-CM | POA: Diagnosis not present

## 2022-05-09 DIAGNOSIS — H5789 Other specified disorders of eye and adnexa: Secondary | ICD-10-CM | POA: Diagnosis not present

## 2022-05-12 DIAGNOSIS — R972 Elevated prostate specific antigen [PSA]: Secondary | ICD-10-CM | POA: Diagnosis not present

## 2022-05-25 ENCOUNTER — Other Ambulatory Visit: Payer: Self-pay

## 2022-05-25 MED ORDER — FARXIGA 5 MG PO TABS: ORAL_TABLET | ORAL | 1 refills | Status: DC

## 2022-05-25 NOTE — Progress Notes (Signed)
Insurance will only cover 90 day rx. 90 day supply sent in.

## 2022-06-12 ENCOUNTER — Other Ambulatory Visit: Payer: Self-pay | Admitting: Endocrinology

## 2022-06-16 ENCOUNTER — Ambulatory Visit: Payer: 59 | Admitting: Internal Medicine

## 2022-06-20 ENCOUNTER — Telehealth: Payer: Self-pay | Admitting: Internal Medicine

## 2022-06-20 NOTE — Telephone Encounter (Signed)
Left message for pt to call back  °

## 2022-06-20 NOTE — Telephone Encounter (Signed)
Received MyChart message from patient requesting to reschedule a new patient appointment with Dr. Leone Payor to discuss hemorrhoids.  There are none.  Patient has an established history with Dr. Leone Payor.  Please call patient and advise.  Thank you.

## 2022-06-21 ENCOUNTER — Other Ambulatory Visit: Payer: Self-pay | Admitting: Endocrinology

## 2022-06-21 DIAGNOSIS — E119 Type 2 diabetes mellitus without complications: Secondary | ICD-10-CM

## 2022-06-21 NOTE — Telephone Encounter (Signed)
Pt stated that he wanted to see Dr. Leone Payor about hemorrhoids that he has been experiencing off and on.  Pt did not want to see an APP for a sooner appointment: Pt was scheduled to see Dr. Leone Payor on 08/29/2022 at 8:50 AM  Pt made aware. Pt encouraged to use wet wipes, OTC preporation H, and sitz bath if needed.  Pt verbalized understanding with all questions answered.

## 2022-07-01 DIAGNOSIS — R972 Elevated prostate specific antigen [PSA]: Secondary | ICD-10-CM | POA: Diagnosis not present

## 2022-07-05 DIAGNOSIS — C61 Malignant neoplasm of prostate: Secondary | ICD-10-CM | POA: Diagnosis not present

## 2022-07-05 DIAGNOSIS — N4232 Atypical small acinar proliferation of prostate: Secondary | ICD-10-CM | POA: Diagnosis not present

## 2022-07-05 DIAGNOSIS — R972 Elevated prostate specific antigen [PSA]: Secondary | ICD-10-CM | POA: Diagnosis not present

## 2022-07-07 DIAGNOSIS — C61 Malignant neoplasm of prostate: Secondary | ICD-10-CM | POA: Diagnosis not present

## 2022-07-28 ENCOUNTER — Institutional Professional Consult (permissible substitution) (INDEPENDENT_AMBULATORY_CARE_PROVIDER_SITE_OTHER): Payer: 59 | Admitting: Otolaryngology

## 2022-08-02 ENCOUNTER — Encounter: Payer: Self-pay | Admitting: Internal Medicine

## 2022-08-02 ENCOUNTER — Ambulatory Visit (INDEPENDENT_AMBULATORY_CARE_PROVIDER_SITE_OTHER): Payer: 59 | Admitting: Internal Medicine

## 2022-08-02 ENCOUNTER — Ambulatory Visit (INDEPENDENT_AMBULATORY_CARE_PROVIDER_SITE_OTHER): Payer: 59

## 2022-08-02 VITALS — BP 120/80 | HR 87 | Temp 98.0°F | Ht 75.0 in | Wt 201.0 lb

## 2022-08-02 DIAGNOSIS — M961 Postlaminectomy syndrome, not elsewhere classified: Secondary | ICD-10-CM | POA: Diagnosis not present

## 2022-08-02 DIAGNOSIS — I10 Essential (primary) hypertension: Secondary | ICD-10-CM

## 2022-08-02 DIAGNOSIS — E785 Hyperlipidemia, unspecified: Secondary | ICD-10-CM

## 2022-08-02 DIAGNOSIS — I771 Stricture of artery: Secondary | ICD-10-CM | POA: Diagnosis not present

## 2022-08-02 DIAGNOSIS — R053 Chronic cough: Secondary | ICD-10-CM | POA: Diagnosis not present

## 2022-08-02 DIAGNOSIS — E1169 Type 2 diabetes mellitus with other specified complication: Secondary | ICD-10-CM

## 2022-08-02 DIAGNOSIS — J3089 Other allergic rhinitis: Secondary | ICD-10-CM | POA: Diagnosis not present

## 2022-08-02 DIAGNOSIS — N529 Male erectile dysfunction, unspecified: Secondary | ICD-10-CM

## 2022-08-02 DIAGNOSIS — J301 Allergic rhinitis due to pollen: Secondary | ICD-10-CM

## 2022-08-02 DIAGNOSIS — Z7984 Long term (current) use of oral hypoglycemic drugs: Secondary | ICD-10-CM

## 2022-08-02 DIAGNOSIS — E118 Type 2 diabetes mellitus with unspecified complications: Secondary | ICD-10-CM | POA: Diagnosis not present

## 2022-08-02 DIAGNOSIS — R059 Cough, unspecified: Secondary | ICD-10-CM | POA: Diagnosis not present

## 2022-08-02 LAB — COMPREHENSIVE METABOLIC PANEL
ALT: 22 U/L (ref 0–53)
AST: 20 U/L (ref 0–37)
Albumin: 4 g/dL (ref 3.5–5.2)
Alkaline Phosphatase: 45 U/L (ref 39–117)
BUN: 17 mg/dL (ref 6–23)
CO2: 30 mEq/L (ref 19–32)
Calcium: 8.9 mg/dL (ref 8.4–10.5)
Chloride: 107 mEq/L (ref 96–112)
Creatinine, Ser: 1.12 mg/dL (ref 0.40–1.50)
GFR: 67.73 mL/min (ref >60.00)
Glucose, Bld: 108 mg/dL — ABNORMAL HIGH (ref 70–99)
Potassium: 4.1 mEq/L (ref 3.5–5.1)
Sodium: 141 mEq/L (ref 135–145)
Total Bilirubin: 1.2 mg/dL (ref 0.2–1.2)
Total Protein: 6.5 g/dL (ref 6.0–8.3)

## 2022-08-02 LAB — LIPID PANEL
Cholesterol: 231 mg/dL — ABNORMAL HIGH (ref 0–200)
HDL: 74.5 mg/dL (ref >39.00)
LDL Cholesterol: 138 mg/dL — ABNORMAL HIGH (ref 0–99)
NonHDL: 156.15
Total CHOL/HDL Ratio: 3
Triglycerides: 91 mg/dL (ref 0.0–149.0)
VLDL: 18.2 mg/dL (ref 0.0–40.0)

## 2022-08-02 LAB — CBC
HCT: 44.8 % (ref 39.0–52.0)
Hemoglobin: 14.4 g/dL (ref 13.0–17.0)
MCHC: 32.2 g/dL (ref 30.0–36.0)
MCV: 86.3 fl (ref 78.0–100.0)
Platelets: 131 10*3/uL — ABNORMAL LOW (ref 150.0–400.0)
RBC: 5.19 Mil/uL (ref 4.22–5.81)
RDW: 14.8 % (ref 11.5–15.5)
WBC: 2.4 10*3/uL — ABNORMAL LOW (ref 4.0–10.5)

## 2022-08-02 LAB — MICROALBUMIN / CREATININE URINE RATIO
Creatinine,U: 126.3 mg/dL
Microalb Creat Ratio: 0.6 mg/g (ref 0.0–30.0)
Microalb, Ur: <0.7 mg/dL (ref 0.0–1.9)

## 2022-08-02 LAB — HEMOGLOBIN A1C: Hgb A1c MFr Bld: 6.2 % (ref 4.6–6.5)

## 2022-08-02 MED ORDER — MONTELUKAST SODIUM 10 MG PO TABS: 10.0000 mg | ORAL_TABLET | Freq: Every day | ORAL | 3 refills | Status: DC

## 2022-08-02 MED ORDER — AMLODIPINE BESYLATE 2.5 MG PO TABS: 2.5000 mg | ORAL_TABLET | Freq: Every day | ORAL | 3 refills | Status: DC

## 2022-08-02 MED ORDER — SILDENAFIL CITRATE 20 MG PO TABS: ORAL_TABLET | ORAL | 1 refills | Status: DC

## 2022-08-02 NOTE — Progress Notes (Signed)
   Subjective:   Patient ID: Cameron Cardenas, male    DOB: 1955/03/19, 67 y.o.   MRN: [id number]  HPI The patient is a new 67 YO man coming in for ongoing cough 2-3 months. Had apt with ENT which was canceled as he did not have appropriate referral. Former smoker quit 3 years ago. Does have significant drainage chronically and has tried otc without relief. Also needs assessment of chronic medical see A/P for details.   PMH, [facility], social history reviewed and updated  Review of Systems  Constitutional: Negative.   HENT:  Positive for congestion and postnasal drip.   Eyes: Negative.   Respiratory:  Positive for cough. Negative for chest tightness and shortness of breath.   Cardiovascular:  Negative for chest pain, palpitations and leg swelling.  Gastrointestinal:  Negative for abdominal distention, abdominal pain, constipation, diarrhea, nausea and vomiting.  Musculoskeletal: Negative.   Skin: Negative.   Neurological: Negative.   Psychiatric/Behavioral: Negative.      Objective:  Physical Exam Constitutional:      Appearance: He is well-developed.  HENT:     Head: Normocephalic and atraumatic.     Comments: Oropharynx with redness and clear drainage, nose with swollen turbinates, TMs normal bilaterally.  Neck:     Thyroid: No thyromegaly.  Cardiovascular:     Rate and Rhythm: Normal rate and regular rhythm.  Pulmonary:     Effort: Pulmonary effort is normal. No respiratory distress.     Breath sounds: Normal breath sounds. No wheezing or rales.  Abdominal:     General: Bowel sounds are normal. There is no distension.     Palpations: Abdomen is soft.     Tenderness: There is no abdominal tenderness. There is no rebound.  Musculoskeletal:        General: No tenderness.     Cervical back: Normal range of motion.  Lymphadenopathy:     Cervical: No cervical adenopathy.  Skin:    General: Skin is warm and dry.  Neurological:     Mental Status: He is alert and oriented to  person, place, and time.     Coordination: Coordination normal.     Vitals:   08/02/22 0912  BP: 120/80  Pulse: 87  Temp: 98 F (36.7 C)  TempSrc: Oral  SpO2: 99%  Weight: 201 lb (91.2 kg)  Height: 6\' 3"  (1.905 m)    Assessment & Plan:

## 2022-08-02 NOTE — Patient Instructions (Addendum)
We are checking the chest x-ray today.  We have sent in singulair (montelukast) to take 1 pill daily for the drainage/sinuses.   We have sent in the blood pressure medication and sildenafil refill for you.

## 2022-08-05 NOTE — Assessment & Plan Note (Signed)
Taking atorvastatin 80 mg daily and zetia 10 mg daily. Checking lipid panel and adjust as needed for LDL <100.

## 2022-08-05 NOTE — Assessment & Plan Note (Signed)
Doing well and denies current pain in back.

## 2022-08-05 NOTE — Assessment & Plan Note (Signed)
Has struggled with this and now with chronic cough. Checking CXR and rx singulair to start in the meantime with flonase continued. Referral to ENT and keep apt if needed. Adjust plan as needed.

## 2022-08-05 NOTE — Assessment & Plan Note (Signed)
BP at goal on amlodipine 2.5 mg daily. Needs new rx which is done at visit as he is close to out of medication. Continue. Checking CMP and adjust as needed.

## 2022-08-05 NOTE — Assessment & Plan Note (Signed)
Seeing endo with appropriate sugars at goal. He is taking farxiga 5 mg daily and metformin 500 mg BID. He is on statin and not on ACE-I/ARB. Checking HgA1c, CMP, lipid panel, microalbumin to creatinine ratio as due. He will still see endo for dosing adjustments if needed. No low sugars. Foot exam was done as not up to date. Reminded about eye exam.

## 2022-08-05 NOTE — Assessment & Plan Note (Signed)
Refilled sildenafil which he uses as needed. Understands use and risk/benefit.

## 2022-08-09 DIAGNOSIS — C61 Malignant neoplasm of prostate: Secondary | ICD-10-CM | POA: Diagnosis not present

## 2022-08-15 ENCOUNTER — Encounter (INDEPENDENT_AMBULATORY_CARE_PROVIDER_SITE_OTHER): Payer: Self-pay | Admitting: Otolaryngology

## 2022-08-16 ENCOUNTER — Other Ambulatory Visit: Payer: 59

## 2022-08-17 DIAGNOSIS — H0288A Meibomian gland dysfunction right eye, upper and lower eyelids: Secondary | ICD-10-CM | POA: Diagnosis not present

## 2022-08-17 DIAGNOSIS — E119 Type 2 diabetes mellitus without complications: Secondary | ICD-10-CM | POA: Diagnosis not present

## 2022-08-17 DIAGNOSIS — H0288B Meibomian gland dysfunction left eye, upper and lower eyelids: Secondary | ICD-10-CM | POA: Diagnosis not present

## 2022-08-17 DIAGNOSIS — H04123 Dry eye syndrome of bilateral lacrimal glands: Secondary | ICD-10-CM | POA: Diagnosis not present

## 2022-08-25 ENCOUNTER — Encounter: Payer: Self-pay | Admitting: Endocrinology

## 2022-08-25 ENCOUNTER — Ambulatory Visit (INDEPENDENT_AMBULATORY_CARE_PROVIDER_SITE_OTHER): Payer: BC Managed Care – PPO | Admitting: Endocrinology

## 2022-08-25 VITALS — BP 130/80 | HR 73 | Ht 75.0 in | Wt 202.8 lb

## 2022-08-25 DIAGNOSIS — E785 Hyperlipidemia, unspecified: Secondary | ICD-10-CM

## 2022-08-25 DIAGNOSIS — E1169 Type 2 diabetes mellitus with other specified complication: Secondary | ICD-10-CM

## 2022-08-25 DIAGNOSIS — E119 Type 2 diabetes mellitus without complications: Secondary | ICD-10-CM | POA: Diagnosis not present

## 2022-08-25 MED ORDER — ROSUVASTATIN CALCIUM 40 MG PO TABS: 40.0000 mg | ORAL_TABLET | Freq: Every day | ORAL | 3 refills | Status: DC

## 2022-08-25 NOTE — Patient Instructions (Addendum)
Diabetes medicine same.  Stop atorvastatin and start crestor 40mg  daily. [facility].  Referred to ophthalmology / eye clinic.   Lab for lipid in 3 months.

## 2022-08-25 NOTE — Progress Notes (Signed)
Outpatient Endocrinology Note [place] , MD  08/25/22  Patient's Name: Cameron Cardenas    DOB: November 14, 1955    MRN: [id number]                                                    REASON OF VISIT: Follow up for type 2 diabetes mellitus  PCP: Georgianne Fick, MD  HISTORY OF PRESENT ILLNESS:   DAIR NECESSARY is a 67 y.o. old male with past medical history listed below, is here for follow up of type 2 diabetes mellitus.  Patient was last seen by Dr. Lucianne Muss in March 2024.  Pertinent Diabetes History: Patient was diagnosed with type 2 diabetes mellitus in 2004.  He was initially treated with metformin and subsequently Actoplusmet.  In 2013 he changed his diet significantly and he started exercising.  This helped him with weight loss and subsequently good sugar control and A1c has been in upper normal range.  Chronic Diabetes Complications : Retinopathy: yes. Last ophthalmology exam was done on 3 months ago at [facility].  Nephropathy: no Peripheral neuropathy: no Coronary artery disease: no Stroke: no  Relevant comorbidities and cardiovascular risk factors: Obesity: yes Body mass index is 25.35 kg/m.  Hypertension: yes Hyperlipidemia. Yes, on statin and ezetimibe  Current / Home Diabetic regimen includes: Actos 30 mg daily. Metformin XR 500 mg 2 times a day. Farxiga 5 mg daily.  Prior diabetic medications:  Glycemic data:   Did not bring glucometer in the clinic today.  Reports his blood sugar has been mostly 130 range.  He has been checking daily in the morning fasting. He uses One Touch glucometer.  Factors modifying glucose control: 1.  Diabetic diet assessment: Eating healthy.  2.  Staying active or exercising: Regular exercise.  3.  Medication compliance: compliant all of the time.  Interval history 08/25/22 Patient hemoglobin A1c 6.2% controlled.  Diabetes regimen as reviewed above.  He denies complaints of numbness and tingling of the feet.  No  vision problem.  He reports he had diabetic eye exam about 3 months ago at [facility] eye care however this clinic is closed and he was told that he has diabetic retinopathy mild and asked to be seen at retina clinic.    Labs reviewed and he has elevated LDL 138.  Recent urine microalbumin creatinine ratio normal.  Patient's renal function is stable.  REVIEW OF SYSTEMS As per history of present illness.   PAST MEDICAL HISTORY: Past Medical History:  Diagnosis Date   Allergic rhinitis due to pollen    Anal fissure    Bilateral sciatica 07/09/2018   Chronic pain syndrome 07/09/2018   Chronic, continuous use of opioids 07/09/2018   DDD (degenerative disc disease), lumbar    Essential hypertension, malignant    Hemorrhoids    Lumbago    Lumbar post-laminectomy syndrome 07/09/2018   Plantar fasciitis of left foot    Pure hypercholesterolemia    Spinal stenosis of lumbar region 07/09/2018   Type II or unspecified type diabetes mellitus without mention of complication, not stated as uncontrolled    dx in 2005   Unspecified vitamin D deficiency     PAST SURGICAL HISTORY: Past Surgical History:  Procedure Laterality Date   COLONOSCOPY     in [facility], MD no longer in practice, does not recall the name  of facility. Does belive polyps were removed   LUMBAR DISC SURGERY  2011   total of 3 sx on back per pt   MAXIMUM ACCESS (MAS)POSTERIOR LUMBAR INTERBODY FUSION (PLIF) 1 LEVEL N/A 06/10/2014   Procedure: Redo Lumbar Five-Sacral One Decompression with maximum access posterior lumbar interbody fusion;  Surgeon: Maeola Harman, MD;  Location: [facility];  Service: Neurosurgery;  Laterality: N/A;  Redo Decompression with maximum access posterior lumbar interbody fusion, L5-S1    ALLERGIES: Allergies  Allergen Reactions   Losartan Potassium-Hctz Other (See Comments)    FAMILY HISTORY:  Family History  Problem Relation Age of Onset   Hypertension Mother    Kidney disease Mother    Diabetes Mother     Hypertension Father    Diabetes Sister    Cataracts Sister    Seizures Brother        caused his death   Colon cancer Neg Hx    Esophageal cancer Neg Hx    Rectal cancer Neg Hx    Stomach cancer Neg Hx    Heart disease Neg Hx    Colon polyps Neg Hx     SOCIAL HISTORY: Social History   Socioeconomic History   Marital status: Married    Spouse name: Not on file   Number of children: 1   Years of education: 12   Highest education level: High school graduate  Occupational History   Occupation: disabled  Tobacco Use   Smoking status: Former    Types: Cigars    Quit date: 01/11/2019    Years since quitting: 3.6   Smokeless tobacco: Never  Vaping Use   Vaping status: Never Used  Substance and Sexual Activity   Alcohol use: Not Currently    Comment: 6 pack beer lasts a month   Drug use: Not Currently    Types: Marijuana   Sexual activity: Not on file  Other Topics Concern   Not on file  Social History Narrative   Lives with wife in a one story home.  Has one child.     He is on disability since January 2020, stopped working in February 2019 for low back pain.  Former Community education officer.     Education: high school.     Social Determinants of Health   Financial Resource Strain: Not on file  Food Insecurity: Not on file  Transportation Needs: Not on file  Physical Activity: Not on file  Stress: Not on file  Social Connections: Not on file    MEDICATIONS:  Current Outpatient Medications  Medication Sig Dispense Refill   amLODipine (NORVASC) 2.5 MG tablet Take 1 tablet (2.5 mg total) by mouth daily. 90 tablet 3   aspirin 81 MG tablet Take 81 mg by mouth daily.     Blood Glucose Monitoring Suppl (ONETOUCH VERIO) w/Device KIT Check blood sugars 3 times daily 1 kit 0   econazole nitrate 1 % cream Apply topically 2 (two) times daily.     ezetimibe (ZETIA) 10 MG tablet Take 1 tablet (10 mg total) by mouth daily. 90 tablet 1   FARXIGA 5 MG TABS tablet TAKE 1 TABLET BY MOUTH  EVERY DAY IN THE MORNING 90 tablet 1   fluticasone (FLONASE) 50 MCG/ACT nasal spray      glucose blood (ONETOUCH VERIO) test strip Check blood sugar 3 times daily 300 each 3   glucose blood (ONETOUCH VERIO) test strip USE TO TEST BLOOD SUGARS ONCE DAILY 100 strip 2   ketoconazole (NIZORAL) 2 %  cream as needed.     Lancets (ONETOUCH ULTRASOFT) lancets Check blood sugar 3 times daily 100 each 12   metFORMIN (GLUCOPHAGE-XR) 500 MG 24 hr tablet TAKE 1 TABLET BY MOUTH TWICE A DAY 180 tablet 1   montelukast (SINGULAIR) 10 MG tablet Take 1 tablet (10 mg total) by mouth at bedtime. 30 tablet 3   OneTouch Delica Lancets 33G MISC Check blood sugars 3 times daily 300 each 3   rosuvastatin (CRESTOR) 40 MG tablet Take 1 tablet (40 mg total) by mouth daily. 90 tablet 3   sildenafil (REVATIO) 20 MG tablet Take 2-3 tablets by mouth as needed, as directed Discontinue if having headaches. 90 tablet 1   No current facility-administered medications for this visit.    PHYSICAL EXAM: Vitals:   08/25/22 0814  BP: 130/80  Pulse: 73  SpO2: 98%  Weight: 202 lb 12.8 oz (92 kg)  Height: 6\' 3"  (1.905 m)   Body mass index is 25.35 kg/m.  Wt Readings from Last 3 Encounters:  08/25/22 202 lb 12.8 oz (92 kg)  08/02/22 201 lb (91.2 kg)  03/11/22 217 lb 9.6 oz (98.7 kg)    General: Well developed, well nourished male in no apparent distress.  HEENT: AT/[place], no external lesions.  Eyes: Conjunctiva clear and no icterus. Neck: Neck supple  Lungs: Respirations not labored Neurologic: Alert, oriented, normal speech Extremities / Skin: Dry. No sores or rashes noted. No acanthosis nigricans Psychiatric: Does not appear depressed or anxious  Diabetic Foot Exam - Simple   No data filed    LABS Reviewed Lab Results  Component Value Date   HGBA1C 6.2 08/02/2022   HGBA1C 6.1 03/09/2022   HGBA1C 6.3 08/30/2021   No results found for: "FRUCTOSAMINE" Lab Results  Component Value Date   CHOL 231 (H) 08/02/2022    HDL 74.50 08/02/2022   LDLCALC 138 (H) 08/02/2022   LDLDIRECT 68.0 03/09/2022   TRIG 91.0 08/02/2022   CHOLHDL 3 08/02/2022   Lab Results  Component Value Date   MICRALBCREAT 0.6 08/02/2022   MICRALBCREAT 1.0 08/30/2021   Lab Results  Component Value Date   CREATININE 1.12 08/02/2022   Lab Results  Component Value Date   GFR 67.73 08/02/2022    ASSESSMENT / PLAN  1. Diabetes mellitus, stable (HCC)   2. Hyperlipidemia associated with type 2 diabetes mellitus (HCC)     Diabetes Mellitus type 2, complicated by possibly retinopathy. - Diabetic status / severity: Controlled  Lab Results  Component Value Date   HGBA1C 6.2 08/02/2022    - Hemoglobin A1c goal : <7%  - Medications: No change  I) continue Actos 30 mg daily. II) continue metformin XR 500 mg 2 times a day. III) continue Farxiga 5 mg daily.  - Home glucose testing: Daily in the morning fasting and occasionally at bedtime. - Discussed/ Gave Hypoglycemia treatment plan.  # Annual urine for microalbuminuria/ creatinine ratio, no microalbuminuria currently. Last  Lab Results  Component Value Date   MICRALBCREAT 0.6 08/02/2022    # Foot check nightly / neuropathy.  # Patient reports he possibly has diabetic retinopathy, will report to ophthalmology [place] [facility].  - Diet: Eat reasonable portion sizes to promote a healthy weight - Life style / activity / exercise: Discussed.   2. Blood pressure  -  BP Readings from Last 1 Encounters:  08/25/22 130/80    - Control is in target.  - No change in current plans.  3. Lipid status /  Hyperlipidemia - Last  Lab Results  Component Value Date   LDLCALC 138 (H) 08/02/2022   -Recent LDL elevated.  She is currently taking atorvastatin 80 mg daily.  And Zetia 10 mg daily. -Stop atorvastatin and start rosuvastatin 40 mg daily.  Continue Zetia 10 mg daily. -Check lipid panel in 3 months.  Diagnoses and all orders for this  visit:  Diabetes mellitus, stable (HCC) -     Ambulatory referral to Ophthalmology  Hyperlipidemia associated with type 2 diabetes mellitus (HCC) -     Lipid panel; Future  Other orders -     rosuvastatin (CRESTOR) 40 MG tablet; Take 1 tablet (40 mg total) by mouth daily.    DISPOSITION Follow up in clinic in 6  months suggested.   All questions answered and patient verbalized understanding of the plan.  [place] , MD [facility] Endocrinology [facility] [address] [place], [address] [place], [place] [address] Phone # [phone]  At least part of this note was generated using voice recognition software. Inadvertent word errors may have occurred, which were not recognized during the proofreading process.

## 2022-08-26 ENCOUNTER — Other Ambulatory Visit: Payer: Self-pay | Admitting: Endocrinology

## 2022-08-29 ENCOUNTER — Ambulatory Visit: Payer: 59 | Admitting: Internal Medicine

## 2022-08-29 ENCOUNTER — Telehealth: Payer: Self-pay | Admitting: Endocrinology

## 2022-08-29 NOTE — Telephone Encounter (Signed)
Patient advising his eye doctor only accepts fax referral to the [facility].Fax [phone]

## 2022-09-01 ENCOUNTER — Encounter (INDEPENDENT_AMBULATORY_CARE_PROVIDER_SITE_OTHER): Payer: Self-pay | Admitting: Otolaryngology

## 2022-09-01 ENCOUNTER — Telehealth: Payer: Self-pay | Admitting: Endocrinology

## 2022-09-01 ENCOUNTER — Ambulatory Visit (INDEPENDENT_AMBULATORY_CARE_PROVIDER_SITE_OTHER): Payer: BC Managed Care – PPO | Admitting: Otolaryngology

## 2022-09-01 VITALS — BP 132/86 | Ht 76.0 in | Wt 205.0 lb

## 2022-09-01 DIAGNOSIS — R0981 Nasal congestion: Secondary | ICD-10-CM | POA: Diagnosis not present

## 2022-09-01 DIAGNOSIS — J342 Deviated nasal septum: Secondary | ICD-10-CM

## 2022-09-01 DIAGNOSIS — R0982 Postnasal drip: Secondary | ICD-10-CM

## 2022-09-01 DIAGNOSIS — J329 Chronic sinusitis, unspecified: Secondary | ICD-10-CM | POA: Diagnosis not present

## 2022-09-01 DIAGNOSIS — K219 Gastro-esophageal reflux disease without esophagitis: Secondary | ICD-10-CM | POA: Diagnosis not present

## 2022-09-01 DIAGNOSIS — J343 Hypertrophy of nasal turbinates: Secondary | ICD-10-CM

## 2022-09-01 DIAGNOSIS — J3089 Other allergic rhinitis: Secondary | ICD-10-CM

## 2022-09-01 DIAGNOSIS — R053 Chronic cough: Secondary | ICD-10-CM | POA: Diagnosis not present

## 2022-09-01 MED ORDER — FLUTICASONE PROPIONATE 50 MCG/ACT NA SUSP: 2.0000 | Freq: Every day | NASAL | 6 refills | Status: DC

## 2022-09-01 MED ORDER — FAMOTIDINE 20 MG PO TABS: 20.0000 mg | ORAL_TABLET | Freq: Two times a day (BID) | ORAL | 1 refills | Status: DC

## 2022-09-01 MED ORDER — DESLORATADINE 5 MG PO TABS: 5.0000 mg | ORAL_TABLET | Freq: Every day | ORAL | 3 refills | Status: DC

## 2022-09-01 NOTE — Telephone Encounter (Signed)
Patient is calling and is very upset saying that a referral was supposed to be made for a diabetic eye exam and he has called that office and they told him that they have not received a referral to date.  Patient would like a phone call back after the referral has been sent.  Patient also states that the referral has to be faxed.

## 2022-09-01 NOTE — Patient Instructions (Addendum)
-   take Clarinex (allergy pill) and start Flonase twice daily - start Famotidine and try Reflux Gourmet - start nasal saline rinses - see below information about NeilMed sinus rinse - schedule CT sinuses and see Pulmonary for chronic cough - return after testing and consultation   - Take Reflux Gourmet (natural supplement available on Amazon) to help with symptoms of chronic throat irritation       Lloyd Huger Med Nasal Saline Rinse   - start nasal saline rinses with NeilMed Bottle available over the counter or online to help with nasal congestion

## 2022-09-01 NOTE — Progress Notes (Signed)
ENT CONSULT:  Reason for Consult: chronic nasal congestion and chronic productive cough   HPI: Cameron Cardenas is an 67 y.o. male with hx allergic rhinitis, used Flonase x 6 weeks then stopped, previously on Zyrtec, who is here for persistent productive cough and chronic nasal congestion. He reports chronic nasal congestion for years, and has constant post-nasal drainage. No reflux sx and no smoking hx. Had allergy testing in the past reports it was positive and no shots in the past. No prior sinus surgery or imaging. NO hx of asthma.  No Pulm evaluation in the past. Had negative CXR. Denies PNA, denies hx of smoking to me. He denies nasal or sinus surgery and no hx of frequent recurrent sinus infections.   Records Reviewed:  Office visit with Endocrine 08/25/22 - T2DM hx good control and A1c has been upper normal range  Sent to ENT for chronic cough by [facility]     Past Medical History:  Diagnosis Date   Allergic rhinitis due to pollen    Anal fissure    Bilateral sciatica 07/09/2018   Chronic pain syndrome 07/09/2018   Chronic, continuous use of opioids 07/09/2018   DDD (degenerative disc disease), lumbar    Essential hypertension, malignant    Hemorrhoids    Lumbago    Lumbar post-laminectomy syndrome 07/09/2018   Plantar fasciitis of left foot    Pure hypercholesterolemia    Spinal stenosis of lumbar region 07/09/2018   Type II or unspecified type diabetes mellitus without mention of complication, not stated as uncontrolled    dx in 2005   Unspecified vitamin D deficiency     Past Surgical History:  Procedure Laterality Date   COLONOSCOPY     in [facility], MD no longer in practice, does not recall the name of facility. Does belive polyps were removed   LUMBAR DISC SURGERY  2011   total of 3 sx on back per pt   MAXIMUM ACCESS (MAS)POSTERIOR LUMBAR INTERBODY FUSION (PLIF) 1 LEVEL N/A 06/10/2014   Procedure: Redo Lumbar Five-Sacral One Decompression with maximum access  posterior lumbar interbody fusion;  Surgeon: Maeola Harman, MD;  Location: [facility];  Service: Neurosurgery;  Laterality: N/A;  Redo Decompression with maximum access posterior lumbar interbody fusion, L5-S1    Family History  Problem Relation Age of Onset   Hypertension Mother    Kidney disease Mother    Diabetes Mother    Hypertension Father    Diabetes Sister    Cataracts Sister    Seizures Brother        caused his death   Colon cancer Neg Hx    Esophageal cancer Neg Hx    Rectal cancer Neg Hx    Stomach cancer Neg Hx    Heart disease Neg Hx    Colon polyps Neg Hx     Social History:  reports that he has never smoked. He has never used smokeless tobacco. He reports that he does not currently use alcohol. He reports that he does not currently use drugs after having used the following drugs: Marijuana.  Allergies:  Allergies  Allergen Reactions   Losartan Potassium-Hctz Other (See Comments)    Medications: I have reviewed the patient's current medications.  The PMH, PSH, Medications, Allergies, and SH were reviewed and updated.  ROS: Constitutional: Negative for fever, weight loss and weight gain. Cardiovascular: Negative for chest pain and dyspnea on exertion. Respiratory: Is not experiencing shortness of breath at rest. Gastrointestinal: Negative for nausea  and vomiting. Neurological: Negative for headaches. Psychiatric: The patient is not nervous/anxious  Blood pressure 132/86, height 6\' 4"  (1.93 m), weight 205 lb (93 kg), SpO2 97%.  PHYSICAL EXAM:  Exam: General: Well-developed, well-nourished Communication and Voice: raspy Respiratory Respiratory effort: Equal inspiration and expiration without stridor Cardiovascular Peripheral Vascular: Warm extremities with equal color/perfusion Eyes: No nystagmus with equal extraocular motion bilaterally Neuro/Psych/Balance: Patient oriented to person, place, and time; Appropriate mood and affect; Gait is intact with  no imbalance; Cranial nerves I-XII are intact Head and Face Inspection: Normocephalic and atraumatic without mass or lesion Palpation: Facial skeleton intact without bony stepoffs Salivary Glands: No mass or tenderness Facial Strength: Facial motility symmetric and full bilaterally ENT Pinna: External ear intact and fully developed External canal: Canal is patent with intact skin Tympanic Membrane: Clear and mobile External Nose: No scar or anatomic deformity Internal Nose: Septum is deviated with significant narrowing of bilateral nasal passages worse on the left side (S-shaped septum). No polyp, or purulence. Mucosal edema and erythema present.  Bilateral inferior turbinate hypertrophy.  Lips, Teeth, and gums: Mucosa and teeth intact and viable TMJ: No pain to palpation with full mobility Oral cavity/oropharynx: No erythema or exudate, no lesions present Nasopharynx: No mass or lesion with intact mucosa Hypopharynx: Intact mucosa without pooling of secretions Larynx Glottic: Full true vocal cord mobility without lesion or mass Supraglottic: Normal appearing epiglottis and AE folds Interarytenoid Space: Moderate pachydermia edema Subglottic Space: Patent without lesion or edema Neck Neck and Trachea: Midline trachea without mass or lesion Thyroid: No mass or nodularity Lymphatics: No lymphadenopathy  Procedure:   PROCEDURE NOTE: nasal endoscopy  Preoperative diagnosis: chronic sinusitis symptoms  Postoperative diagnosis: same  Procedure: Diagnostic nasal endoscopy (40981)  Surgeon: Ashok Croon, M.D.  Anesthesia: Topical lidocaine and Afrin  H&P REVIEW: The patient's history and physical were reviewed today prior to procedure. All medications were reviewed and updated as well. Complications: None Condition is stable throughout exam Indications and consent: The patient presents with symptoms of chronic sinusitis not responding to previous therapies. All the risks,  benefits, and potential complications were reviewed with the patient preoperatively and informed consent was obtained. The time out was completed with confirmation of the correct procedure.   Procedure: The patient was seated upright in the clinic. Topical lidocaine and Afrin were applied to the nasal cavity. After adequate anesthesia had occurred, the rigid nasal endoscope was passed into the nasal cavity. The nasal mucosa, turbinates, septum, and sinus drainage pathways were visualized bilaterally. This revealed no purulence or significant secretions that might be cultured. There were no polyps or sites of significant inflammation. The mucosa was intact and there was no crusting present. The scope was then slowly withdrawn and the patient tolerated the procedure well. There were no complications or blood loss.   Preoperative diagnosis: chronic productive cough   Postoperative diagnosis:   Same + GERD/LPR  Procedure: Flexible fiberoptic laryngoscopy  Surgeon: Ashok Croon, MD  Anesthesia: Topical lidocaine and Afrin Complications: None Condition is stable throughout exam  Indications and consent:  The patient presents to the clinic with Indirect laryngoscopy view was incomplete. Thus it was recommended that they undergo a flexible fiberoptic laryngoscopy. All of the risks, benefits, and potential complications were reviewed with the patient preoperatively and verbal informed consent was obtained.  Procedure: The patient was seated upright in the clinic. Topical lidocaine and Afrin were applied to the nasal cavity. After adequate anesthesia had occurred, I then proceeded to pass the flexible  telescope into the nasal cavity. The nasal cavity was patent without rhinorrhea or polyp. The nasopharynx was also patent without mass or lesion. The base of tongue was visualized and was normal. There were no signs of pooling of secretions in the piriform sinuses. The true vocal folds were mobile  bilaterally. There were no signs of glottic or supraglottic mucosal lesion or mass. There was moderate interarytenoid pachydermia and post cricoid edema. The telescope was then slowly withdrawn and the patient tolerated the procedure throughout.     Studies Reviewed:CXR was normal 08/02/22 FINDINGS: Normal sized heart. Tortuous aorta. Clear lungs with normal vascularity. Mild thoracic spine degenerative changes.   IMPRESSION: No active cardiopulmonary disease.    Assessment/Plan: Encounter Diagnoses  Name Primary?   Chronic sinusitis, unspecified location Yes   Chronic cough    Nasal congestion    Post-nasal drip    Environmental and seasonal allergies    Nasal septal deviation    Hypertrophy of both inferior nasal turbinates    Gastroesophageal reflux disease without esophagitis    67 yoM hx of suspected seasonal vs environmental allergies, who is here for productive chronic cough and nasal congestion/PND. N oprior allergy testing or Pulm evaluation/no PFTs I could find, but had normal CXR 06/2021. On exam today there is evidence of significant narrowing of the nasal passages, worse on the left, due to left sided septal deviation and severe mucosal edema/ITH but no purulence or polyps. There was evidence of moderate post-cricoid edema/pachydermia on flexible laryngoscopy portion of the exam, but no lesions or masses and b/l VF were mobile. I suspect his productive cough could be multi-factorial and 2/2 PND, GERD/LPR or lung issue. Will refer to Pulm, defer to them on decision to do CT chest, additional workup. Will start on Clarinex and Flonase for allergies and PND/nasal congestion. Will do CT sinuses to rule out chronic sinusitis. Will treat GERD/LPR. He will return after testing    - take Clarinex (allergy pill) and start Flonase twice daily - start Famotidine and try Reflux Gourmet - start nasal saline rinses - see below information about NeilMed sinus rinse - schedule CT sinuses  and see Pulmonary for chronic cough - return after testing and consultation   Thank you for allowing me to participate in the care of this patient. Please do not hesitate to contact me with any questions or concerns.   Ashok Croon, MD Otolaryngology [facility] Phone: [phone] Fax: [phone]    09/01/2022, 2:13 PM

## 2022-09-01 NOTE — Telephone Encounter (Signed)
Called and spoke with patient to let him know that I got in touch with the referral dept.  That she did speck with someone at [facility] [address] office regarding his referral. I explain to him that we unware that their wasn't using Epic . However that his referral was sent manually to their office and some one be call him with an appt . There booked out for 2 months . But his referral is mark for urgent.

## 2022-09-08 ENCOUNTER — Other Ambulatory Visit (INDEPENDENT_AMBULATORY_CARE_PROVIDER_SITE_OTHER): Payer: Self-pay | Admitting: Otolaryngology

## 2022-09-08 NOTE — Telephone Encounter (Signed)
Patient is calling to say that the office that the referral was sent to does not take his insurance, so he needs a new referral faxed to [phone].  That is the fax number to  [facility] Address: [address], [place], [place] [address].  Phone: [phone].  **Patient states that he would like to have a return call to talk because he has questions.

## 2022-09-09 NOTE — Telephone Encounter (Signed)
Patient wants to make sure that fax was sent manually because they don't get it through electronic system.

## 2022-09-21 ENCOUNTER — Encounter (INDEPENDENT_AMBULATORY_CARE_PROVIDER_SITE_OTHER): Payer: BC Managed Care – PPO | Admitting: Ophthalmology

## 2022-09-21 DIAGNOSIS — H10023 Other mucopurulent conjunctivitis, bilateral: Secondary | ICD-10-CM | POA: Diagnosis not present

## 2022-09-21 DIAGNOSIS — H3581 Retinal edema: Secondary | ICD-10-CM

## 2022-09-23 ENCOUNTER — Telehealth: Payer: Self-pay

## 2022-09-23 NOTE — Telephone Encounter (Signed)
CPT code: [address] [facility] no prior authorization required  [place] authorized #[id number] good from 09/13-11/11/2022

## 2022-09-28 DIAGNOSIS — C61 Malignant neoplasm of prostate: Secondary | ICD-10-CM | POA: Insufficient documentation

## 2022-09-29 ENCOUNTER — Other Ambulatory Visit (INDEPENDENT_AMBULATORY_CARE_PROVIDER_SITE_OTHER): Payer: Self-pay

## 2022-09-29 DIAGNOSIS — C61 Malignant neoplasm of prostate: Secondary | ICD-10-CM | POA: Diagnosis not present

## 2022-09-29 DIAGNOSIS — E118 Type 2 diabetes mellitus with unspecified complications: Secondary | ICD-10-CM | POA: Diagnosis not present

## 2022-09-29 DIAGNOSIS — I10 Essential (primary) hypertension: Secondary | ICD-10-CM | POA: Diagnosis not present

## 2022-09-29 DIAGNOSIS — Z01818 Encounter for other preprocedural examination: Secondary | ICD-10-CM | POA: Diagnosis not present

## 2022-09-29 MED ORDER — FAMOTIDINE 20 MG PO TABS: 20.0000 mg | ORAL_TABLET | Freq: Two times a day (BID) | ORAL | 1 refills | Status: DC

## 2022-10-12 DIAGNOSIS — I10 Essential (primary) hypertension: Secondary | ICD-10-CM | POA: Diagnosis not present

## 2022-10-12 DIAGNOSIS — K219 Gastro-esophageal reflux disease without esophagitis: Secondary | ICD-10-CM | POA: Diagnosis not present

## 2022-10-12 DIAGNOSIS — T402X5A Adverse effect of other opioids, initial encounter: Secondary | ICD-10-CM | POA: Diagnosis not present

## 2022-10-12 DIAGNOSIS — N400 Enlarged prostate without lower urinary tract symptoms: Secondary | ICD-10-CM | POA: Diagnosis not present

## 2022-10-12 DIAGNOSIS — I952 Hypotension due to drugs: Secondary | ICD-10-CM | POA: Diagnosis not present

## 2022-10-12 DIAGNOSIS — E119 Type 2 diabetes mellitus without complications: Secondary | ICD-10-CM | POA: Diagnosis not present

## 2022-10-12 DIAGNOSIS — C61 Malignant neoplasm of prostate: Secondary | ICD-10-CM | POA: Diagnosis not present

## 2022-10-12 DIAGNOSIS — Z7984 Long term (current) use of oral hypoglycemic drugs: Secondary | ICD-10-CM | POA: Diagnosis not present

## 2022-10-12 DIAGNOSIS — Z888 Allergy status to other drugs, medicaments and biological substances status: Secondary | ICD-10-CM | POA: Diagnosis not present

## 2022-10-12 DIAGNOSIS — Z961 Presence of intraocular lens: Secondary | ICD-10-CM | POA: Diagnosis not present

## 2022-10-12 DIAGNOSIS — N411 Chronic prostatitis: Secondary | ICD-10-CM | POA: Diagnosis not present

## 2022-10-13 DIAGNOSIS — Z961 Presence of intraocular lens: Secondary | ICD-10-CM | POA: Diagnosis not present

## 2022-10-13 DIAGNOSIS — C61 Malignant neoplasm of prostate: Secondary | ICD-10-CM | POA: Diagnosis not present

## 2022-10-13 DIAGNOSIS — I952 Hypotension due to drugs: Secondary | ICD-10-CM | POA: Diagnosis not present

## 2022-10-13 DIAGNOSIS — Z7984 Long term (current) use of oral hypoglycemic drugs: Secondary | ICD-10-CM | POA: Diagnosis not present

## 2022-10-13 DIAGNOSIS — E119 Type 2 diabetes mellitus without complications: Secondary | ICD-10-CM | POA: Diagnosis not present

## 2022-10-13 DIAGNOSIS — K219 Gastro-esophageal reflux disease without esophagitis: Secondary | ICD-10-CM | POA: Diagnosis not present

## 2022-10-13 DIAGNOSIS — Z888 Allergy status to other drugs, medicaments and biological substances status: Secondary | ICD-10-CM | POA: Diagnosis not present

## 2022-10-13 DIAGNOSIS — I10 Essential (primary) hypertension: Secondary | ICD-10-CM | POA: Diagnosis not present

## 2022-10-13 DIAGNOSIS — T402X5A Adverse effect of other opioids, initial encounter: Secondary | ICD-10-CM | POA: Diagnosis not present

## 2022-10-19 ENCOUNTER — Encounter (INDEPENDENT_AMBULATORY_CARE_PROVIDER_SITE_OTHER): Payer: BC Managed Care – PPO | Admitting: Ophthalmology

## 2022-10-19 DIAGNOSIS — H3581 Retinal edema: Secondary | ICD-10-CM

## 2022-10-25 DIAGNOSIS — R32 Unspecified urinary incontinence: Secondary | ICD-10-CM | POA: Insufficient documentation

## 2022-10-25 DIAGNOSIS — N393 Stress incontinence (female) (male): Secondary | ICD-10-CM | POA: Diagnosis not present

## 2022-10-25 DIAGNOSIS — N5231 Erectile dysfunction following radical prostatectomy: Secondary | ICD-10-CM | POA: Diagnosis not present

## 2022-10-25 DIAGNOSIS — Z4889 Encounter for other specified surgical aftercare: Secondary | ICD-10-CM | POA: Diagnosis not present

## 2022-10-25 DIAGNOSIS — N3946 Mixed incontinence: Secondary | ICD-10-CM | POA: Diagnosis not present

## 2022-10-25 DIAGNOSIS — C61 Malignant neoplasm of prostate: Secondary | ICD-10-CM | POA: Diagnosis not present

## 2022-10-28 ENCOUNTER — Other Ambulatory Visit: Payer: Self-pay | Admitting: Internal Medicine

## 2022-10-30 ENCOUNTER — Other Ambulatory Visit: Payer: Self-pay | Admitting: Internal Medicine

## 2022-11-03 ENCOUNTER — Institutional Professional Consult (permissible substitution): Payer: 59 | Admitting: Pulmonary Disease

## 2022-11-07 ENCOUNTER — Encounter: Payer: Self-pay | Admitting: Pulmonary Disease

## 2022-11-23 NOTE — Addendum Note (Signed)
Addended by: Bernerd Pho I on: 11/23/2022 08:19 AM   Modules accepted: Orders

## 2022-11-25 ENCOUNTER — Other Ambulatory Visit (INDEPENDENT_AMBULATORY_CARE_PROVIDER_SITE_OTHER): Payer: 59

## 2022-11-25 ENCOUNTER — Other Ambulatory Visit: Payer: Self-pay | Admitting: Endocrinology

## 2022-11-25 DIAGNOSIS — E119 Type 2 diabetes mellitus without complications: Secondary | ICD-10-CM

## 2022-11-25 DIAGNOSIS — E785 Hyperlipidemia, unspecified: Secondary | ICD-10-CM | POA: Diagnosis not present

## 2022-11-25 DIAGNOSIS — E1169 Type 2 diabetes mellitus with other specified complication: Secondary | ICD-10-CM

## 2022-11-25 LAB — BASIC METABOLIC PANEL
BUN: 18 mg/dL (ref 6–23)
CO2: 29 meq/L (ref 19–32)
Calcium: 9.4 mg/dL (ref 8.4–10.5)
Chloride: 107 meq/L (ref 96–112)
Creatinine, Ser: 1.03 mg/dL (ref 0.40–1.50)
GFR: 74.73 mL/min (ref >60.00)
Glucose, Bld: 113 mg/dL — ABNORMAL HIGH (ref 70–99)
Potassium: 4.1 meq/L (ref 3.5–5.1)
Sodium: 143 meq/L (ref 135–145)

## 2022-11-25 LAB — HEMOGLOBIN A1C: Hgb A1c MFr Bld: 6 % (ref 4.6–6.5)

## 2022-11-25 LAB — LIPID PANEL
Cholesterol: 161 mg/dL (ref 0–200)
HDL: 75.1 mg/dL (ref >39.00)
LDL Cholesterol: 73 mg/dL (ref 0–99)
NonHDL: 85.6
Total CHOL/HDL Ratio: 2
Triglycerides: 62 mg/dL (ref 0.0–149.0)
VLDL: 12.4 mg/dL (ref 0.0–40.0)

## 2022-11-29 ENCOUNTER — Ambulatory Visit: Payer: 59 | Attending: Anesthesiology | Admitting: Anesthesiology

## 2022-11-29 ENCOUNTER — Encounter: Payer: Self-pay | Admitting: Anesthesiology

## 2022-11-29 DIAGNOSIS — G894 Chronic pain syndrome: Secondary | ICD-10-CM | POA: Diagnosis not present

## 2022-11-29 DIAGNOSIS — F1190 Opioid use, unspecified, uncomplicated: Secondary | ICD-10-CM

## 2022-11-29 DIAGNOSIS — M51369 Other intervertebral disc degeneration, lumbar region without mention of lumbar back pain or lower extremity pain: Secondary | ICD-10-CM | POA: Insufficient documentation

## 2022-11-29 DIAGNOSIS — M961 Postlaminectomy syndrome, not elsewhere classified: Secondary | ICD-10-CM | POA: Diagnosis not present

## 2022-11-29 DIAGNOSIS — M51362 Other intervertebral disc degeneration, lumbar region with discogenic back pain and lower extremity pain: Secondary | ICD-10-CM

## 2022-11-29 DIAGNOSIS — M48062 Spinal stenosis, lumbar region with neurogenic claudication: Secondary | ICD-10-CM

## 2022-11-29 DIAGNOSIS — M5386 Other specified dorsopathies, lumbar region: Secondary | ICD-10-CM | POA: Diagnosis not present

## 2022-11-29 MED ORDER — CYCLOBENZAPRINE HCL 10 MG PO TABS: 10.0000 mg | ORAL_TABLET | Freq: Three times a day (TID) | ORAL | 0 refills | Status: DC | PRN

## 2022-11-29 NOTE — Progress Notes (Signed)
Virtual Visit via Telephone Note  I connected with Cameron Cardenas on 11/29/22 at  2:00 PM EST by telephone and verified that I am speaking with the correct person using two identifiers.  Location: Patient: Home Provider: [facility] control center   I discussed the limitations, risks, security and privacy concerns of performing an evaluation and management service by telephone and the availability of in person appointments. I also discussed with the patient that there may be a patient responsible charge related to this service. The patient expressed understanding and agreed to proceed.   History of Present Illness: I spoke with Cameron Cardenas via telephone as we were unable link to the video portion of the conference.  He reports that it has been a few years since he has seen me and his back has been pretty stable in the meantime however over the course the last month or so he has had recurrence of a severe lower back pain with radiation into the right hip right posterior lateral leg going down in the right calf.  He has had this pain in the past.  It is generally been reasonably well-controlled requiring some anti-inflammatories and some general stretching but has not bothered him to this extent recently.  He is not sure what caused this pain but it is described as an aching gnawing pain worse with prolonged standing and any type of activity.  It goes down into the right calf with some some associated cramping.  No weakness bowel or bladder dysfunction is noted.  He is having a hard time getting any type of significant relief from the pain.  He last saw [name] about 2 years ago.  He did have an MRI back in 2020 and this was reviewed today with him.  Review of systems: General: No fevers or chills Pulmonary: No shortness of breath or dyspnea Cardiac: No angina or palpitations or lightheadedness GI: No abdominal pain or constipation Psych: No depression    Observations/Objective:  Current Outpatient  Medications:    cyclobenzaprine (FLEXERIL) 10 MG tablet, Take 1 tablet (10 mg total) by mouth 3 (three) times daily as needed for muscle spasms., Disp: 30 tablet, Rfl: 0   amLODipine (NORVASC) 2.5 MG tablet, Take 1 tablet (2.5 mg total) by mouth daily., Disp: 90 tablet, Rfl: 3   aspirin 81 MG tablet, Take 81 mg by mouth daily., Disp: , Rfl:    Blood Glucose Monitoring Suppl (ONETOUCH VERIO) w/Device KIT, Check blood sugars 3 times daily, Disp: 1 kit, Rfl: 0   desloratadine (CLARINEX) 5 MG tablet, Take 1 tablet (5 mg total) by mouth daily., Disp: 90 tablet, Rfl: 3   econazole nitrate 1 % cream, Apply topically 2 (two) times daily., Disp: , Rfl:    ezetimibe (ZETIA) 10 MG tablet, Take 1 tablet (10 mg total) by mouth daily., Disp: 90 tablet, Rfl: 1   famotidine (PEPCID) 20 MG tablet, Take 1 tablet (20 mg total) by mouth 2 (two) times daily., Disp: 180 tablet, Rfl: 1   FARXIGA 5 MG TABS tablet, TAKE 1 TABLET BY MOUTH EVERY DAY IN THE MORNING, Disp: 90 tablet, Rfl: 1   fluticasone (FLONASE) 50 MCG/ACT nasal spray, Place 2 sprays into both nostrils daily., Disp: 16 g, Rfl: 6   glucose blood (ONETOUCH VERIO) test strip, Check blood sugar 3 times daily, Disp: 300 each, Rfl: 3   glucose blood (ONETOUCH VERIO) test strip, USE TO TEST BLOOD SUGARS ONCE DAILY, Disp: 100 strip, Rfl: 2   ketoconazole (NIZORAL) 2 %  cream, as needed., Disp: , Rfl:    Lancets (ONETOUCH ULTRASOFT) lancets, Check blood sugar 3 times daily, Disp: 100 each, Rfl: 12   metFORMIN (GLUCOPHAGE-XR) 500 MG 24 hr tablet, TAKE 1 TABLET BY MOUTH TWICE A DAY, Disp: 180 tablet, Rfl: 1   montelukast (SINGULAIR) 10 MG tablet, TAKE 1 TABLET BY MOUTH EVERYDAY AT BEDTIME, Disp: 90 tablet, Rfl: 1   OneTouch Delica Lancets 33G MISC, Check blood sugars 3 times daily, Disp: 300 each, Rfl: 3   rosuvastatin (CRESTOR) 40 MG tablet, Take 1 tablet (40 mg total) by mouth daily., Disp: 90 tablet, Rfl: 3   sildenafil (REVATIO) 20 MG tablet, Take 2-3 tablets by  mouth as needed, as directed Discontinue if having headaches., Disp: 90 tablet, Rfl: 1   Past Medical History:  Diagnosis Date   Allergic rhinitis due to pollen    Anal fissure    Bilateral sciatica 07/09/2018   Chronic pain syndrome 07/09/2018   Chronic, continuous use of opioids 07/09/2018   DDD (degenerative disc disease), lumbar    Essential hypertension, malignant    Hemorrhoids    Lumbago    Lumbar post-laminectomy syndrome 07/09/2018   Plantar fasciitis of left foot    Pure hypercholesterolemia    Spinal stenosis of lumbar region 07/09/2018   Type II or unspecified type diabetes mellitus without mention of complication, not stated as uncontrolled    dx in 2005   Unspecified vitamin D deficiency    Assessment and Plan: 1. Lumbar post-laminectomy syndrome   2. Degeneration of intervertebral disc of lumbar region with discogenic back pain and lower extremity pain   3. Sciatica of right side associated with disorder of lumbar spine   4. Chronic pain syndrome   5. Spinal stenosis of lumbar region with neurogenic claudication   6. Chronic, continuous use of opioids   Based on our conversation I think it is appropriate to refer him for physical therapy.  He is open to this discussion and contingent upon that, we may proceed with an epidural injection.  We have gone over the risk and benefits of this procedure.  Will schedule him for return to clinic in 1 month for evaluation.  In the meantime he is to start Flexeril 10 mg at bedtime and 5 mg in the morning and 5 mg in the afternoon to help with muscle spasms.  Continue stretching strengthening as tolerated and continue follow-up with his primary care physician for baseline medical care.  Follow Up Instructions:    I discussed the assessment and treatment plan with the patient. The patient was provided an opportunity to ask questions and all were answered. The patient agreed with the plan and demonstrated an understanding of the  instructions.   The patient was advised to call back or seek an in-person evaluation if the symptoms worsen or if the condition fails to improve as anticipated.  I provided 30 minutes of non-face-to-face time during this encounter.   Yevette Edwards, MD

## 2022-11-30 DIAGNOSIS — N393 Stress incontinence (female) (male): Secondary | ICD-10-CM | POA: Diagnosis not present

## 2022-11-30 DIAGNOSIS — Z4889 Encounter for other specified surgical aftercare: Secondary | ICD-10-CM | POA: Diagnosis not present

## 2022-11-30 DIAGNOSIS — N3946 Mixed incontinence: Secondary | ICD-10-CM | POA: Diagnosis not present

## 2022-11-30 DIAGNOSIS — C61 Malignant neoplasm of prostate: Secondary | ICD-10-CM | POA: Diagnosis not present

## 2022-11-30 DIAGNOSIS — N5231 Erectile dysfunction following radical prostatectomy: Secondary | ICD-10-CM | POA: Diagnosis not present

## 2022-12-02 ENCOUNTER — Telehealth: Payer: Self-pay | Admitting: Internal Medicine

## 2022-12-02 NOTE — Telephone Encounter (Signed)
Inbound call from patient wanting to know when he is due for his next colonoscopy. I advised patient that his next procedure would be in October of 2029. Patient wanted to know when his last procedure was, and he was advised it was in October of 2019 and he is on a 10 year recall. Patient stated that he was on the 5 year recall and wants to know why it was changed. Please advise.

## 2022-12-02 NOTE — Telephone Encounter (Signed)
Left message for patient to call back  

## 2022-12-04 ENCOUNTER — Encounter: Payer: Self-pay | Admitting: Internal Medicine

## 2022-12-04 NOTE — Progress Notes (Unsigned)
    Subjective:    Patient ID: Cameron Cardenas, male    DOB: 1955/04/30, 67 y.o.   MRN: [id number]      HPI Clever is here for No chief complaint on file.  Has prostate ca - follows with [facility].    ? UTI -     Medications and allergies reviewed with patient and updated if appropriate.  Current Outpatient Medications on File Prior to Visit  Medication Sig Dispense Refill   amLODipine (NORVASC) 2.5 MG tablet Take 1 tablet (2.5 mg total) by mouth daily. 90 tablet 3   aspirin 81 MG tablet Take 81 mg by mouth daily.     Blood Glucose Monitoring Suppl (ONETOUCH VERIO) w/Device KIT Check blood sugars 3 times daily 1 kit 0   cyclobenzaprine (FLEXERIL) 10 MG tablet Take 1 tablet (10 mg total) by mouth 3 (three) times daily as needed for muscle spasms. 30 tablet 0   desloratadine (CLARINEX) 5 MG tablet Take 1 tablet (5 mg total) by mouth daily. 90 tablet 3   econazole nitrate 1 % cream Apply topically 2 (two) times daily.     ezetimibe (ZETIA) 10 MG tablet Take 1 tablet (10 mg total) by mouth daily. 90 tablet 1   famotidine (PEPCID) 20 MG tablet Take 1 tablet (20 mg total) by mouth 2 (two) times daily. 180 tablet 1   FARXIGA 5 MG TABS tablet TAKE 1 TABLET BY MOUTH EVERY DAY IN THE MORNING 90 tablet 1   fluticasone (FLONASE) 50 MCG/ACT nasal spray Place 2 sprays into both nostrils daily. 16 g 6   glucose blood (ONETOUCH VERIO) test strip Check blood sugar 3 times daily 300 each 3   glucose blood (ONETOUCH VERIO) test strip USE TO TEST BLOOD SUGARS ONCE DAILY 100 strip 2   ketoconazole (NIZORAL) 2 % cream as needed.     Lancets (ONETOUCH ULTRASOFT) lancets Check blood sugar 3 times daily 100 each 12   metFORMIN (GLUCOPHAGE-XR) 500 MG 24 hr tablet TAKE 1 TABLET BY MOUTH TWICE A DAY 180 tablet 1   montelukast (SINGULAIR) 10 MG tablet TAKE 1 TABLET BY MOUTH EVERYDAY AT BEDTIME 90 tablet 1   OneTouch Delica Lancets 33G MISC Check blood sugars 3 times daily 300 each 3   rosuvastatin  (CRESTOR) 40 MG tablet Take 1 tablet (40 mg total) by mouth daily. 90 tablet 3   sildenafil (REVATIO) 20 MG tablet Take 2-3 tablets by mouth as needed, as directed Discontinue if having headaches. 90 tablet 1   No current facility-administered medications on file prior to visit.    Review of Systems     Objective:  There were no vitals filed for this visit. BP Readings from Last 3 Encounters:  09/01/22 132/86  08/25/22 130/80  08/02/22 120/80   Wt Readings from Last 3 Encounters:  09/01/22 205 lb (93 kg)  08/25/22 202 lb 12.8 oz (92 kg)  08/02/22 201 lb (91.2 kg)   There is no height or weight on file to calculate BMI.    Physical Exam         Assessment & Plan:    See Problem List for Assessment and Plan of chronic medical problems.

## 2022-12-05 ENCOUNTER — Ambulatory Visit (INDEPENDENT_AMBULATORY_CARE_PROVIDER_SITE_OTHER): Payer: 59 | Admitting: Internal Medicine

## 2022-12-05 VITALS — BP 112/72 | HR 67 | Temp 98.3°F | Ht 76.0 in | Wt 210.0 lb

## 2022-12-05 DIAGNOSIS — R300 Dysuria: Secondary | ICD-10-CM | POA: Diagnosis not present

## 2022-12-05 DIAGNOSIS — M5441 Lumbago with sciatica, right side: Secondary | ICD-10-CM | POA: Diagnosis not present

## 2022-12-05 DIAGNOSIS — I10 Essential (primary) hypertension: Secondary | ICD-10-CM

## 2022-12-05 LAB — POC URINALSYSI DIPSTICK (AUTOMATED)
Bilirubin, UA: NEGATIVE
Blood, UA: NEGATIVE
Glucose, UA: POSITIVE — AB
Ketones, UA: NEGATIVE
Leukocytes, UA: NEGATIVE
Nitrite, UA: NEGATIVE
Protein, UA: NEGATIVE
Spec Grav, UA: >=1.03 — AB (ref 1.010–1.025)
Urobilinogen, UA: 0.2 U/dL
pH, UA: 6 (ref 5.0–8.0)

## 2022-12-05 NOTE — Telephone Encounter (Signed)
Pt stated that he was under the impression that he was to have a colonoscopy every 5 years. Pt chart was reviewed and noted the letter from 2019 which stated that pt was to have a repeat Colonoscopy in 10 years. Pt made aware Pt verbalized understanding with all questions answered.

## 2022-12-05 NOTE — Patient Instructions (Signed)
     Your urine does not show an infection.     Medications changes include :   None    Follow up with urology.

## 2022-12-06 DIAGNOSIS — C61 Malignant neoplasm of prostate: Secondary | ICD-10-CM | POA: Diagnosis not present

## 2022-12-07 DIAGNOSIS — M5441 Lumbago with sciatica, right side: Secondary | ICD-10-CM | POA: Diagnosis not present

## 2022-12-07 LAB — CULTURE, URINE COMPREHENSIVE: RESULT:: NO GROWTH

## 2022-12-12 DIAGNOSIS — M5441 Lumbago with sciatica, right side: Secondary | ICD-10-CM | POA: Diagnosis not present

## 2022-12-13 ENCOUNTER — Other Ambulatory Visit: Payer: Self-pay | Admitting: Internal Medicine

## 2022-12-16 ENCOUNTER — Encounter: Payer: 59 | Admitting: Internal Medicine

## 2022-12-21 ENCOUNTER — Telehealth: Payer: Self-pay | Admitting: Anesthesiology

## 2022-12-21 NOTE — Telephone Encounter (Signed)
Patient called asking about his appt for an epidural. He is scheduled for 01-09-23 and would like to have it that day. Dr Pernell Dupre did not put an order in. We need to get it authorized. We need order put in

## 2022-12-21 NOTE — Addendum Note (Signed)
Addended by: Yevette Edwards on: 12/21/2022 09:04 AM   Modules accepted: Orders

## 2022-12-21 NOTE — Telephone Encounter (Signed)
I see order for the Epidural. Looks like it was just put in this morning.  Thank you!

## 2022-12-27 ENCOUNTER — Ambulatory Visit: Payer: 59 | Admitting: Anesthesiology

## 2023-01-02 ENCOUNTER — Ambulatory Visit: Payer: 59 | Admitting: Anesthesiology

## 2023-01-05 ENCOUNTER — Telehealth: Payer: Self-pay | Admitting: Radiology

## 2023-01-05 NOTE — Telephone Encounter (Signed)
Copied from CRM [id number]. Topic: Clinical - Medication Question >> Jan 05, 2023 10:05 AM Florestine Avers wrote: Reason for CRM: Lisinopril making patient cough, wants to be prescribed an alternative bp medication. Is requesting a phone call from the nurse.

## 2023-01-05 NOTE — Telephone Encounter (Signed)
This is done and has been forwarded to Dr Yetta Barre for review by Provider

## 2023-01-06 MED ORDER — PROMETHAZINE-DM 6.25-15 MG/5ML PO SYRP: 5.0000 mL | ORAL_SOLUTION | Freq: Four times a day (QID) | ORAL | 0 refills | Status: DC | PRN

## 2023-01-06 NOTE — Telephone Encounter (Signed)
Ok done erx 

## 2023-01-06 NOTE — Telephone Encounter (Signed)
Pt calling for an update.

## 2023-01-06 NOTE — Telephone Encounter (Signed)
Spoke with patient and he understood only a cough meds has been sent in. Patient states he will message again on Monday

## 2023-01-06 NOTE — Addendum Note (Signed)
Addended by: Corwin Levins on: 01/06/2023 10:34 AM   Modules accepted: Orders

## 2023-01-09 ENCOUNTER — Ambulatory Visit: Payer: 59 | Admitting: Anesthesiology

## 2023-01-10 ENCOUNTER — Encounter: Payer: Self-pay | Admitting: Internal Medicine

## 2023-01-10 ENCOUNTER — Ambulatory Visit (INDEPENDENT_AMBULATORY_CARE_PROVIDER_SITE_OTHER): Payer: 59 | Admitting: Internal Medicine

## 2023-01-10 ENCOUNTER — Other Ambulatory Visit (INDEPENDENT_AMBULATORY_CARE_PROVIDER_SITE_OTHER): Payer: Self-pay | Admitting: Otolaryngology

## 2023-01-10 VITALS — BP 120/70 | HR 70 | Temp 97.7°F | Ht 76.0 in | Wt 213.0 lb

## 2023-01-10 DIAGNOSIS — I10 Essential (primary) hypertension: Secondary | ICD-10-CM | POA: Diagnosis not present

## 2023-01-10 DIAGNOSIS — E118 Type 2 diabetes mellitus with unspecified complications: Secondary | ICD-10-CM | POA: Diagnosis not present

## 2023-01-10 DIAGNOSIS — Z7984 Long term (current) use of oral hypoglycemic drugs: Secondary | ICD-10-CM | POA: Diagnosis not present

## 2023-01-10 DIAGNOSIS — J301 Allergic rhinitis due to pollen: Secondary | ICD-10-CM

## 2023-01-10 MED ORDER — HYDROCHLOROTHIAZIDE 12.5 MG PO CAPS: 12.5000 mg | ORAL_CAPSULE | Freq: Every day | ORAL | 3 refills | Status: DC

## 2023-01-10 MED ORDER — AMLODIPINE BESYLATE 5 MG PO TABS: 5.0000 mg | ORAL_TABLET | Freq: Every day | ORAL | 3 refills | Status: DC

## 2023-01-10 MED ORDER — AZELASTINE HCL 0.1 % NA SOLN: 2.0000 | Freq: Two times a day (BID) | NASAL | 12 refills | Status: DC

## 2023-01-10 NOTE — Assessment & Plan Note (Signed)
 Mild to mod, for astelin nasal asd,,  to f/u any worsening symptoms or concerns

## 2023-01-10 NOTE — Progress Notes (Signed)
 Patient ID: Cameron Cardenas, male   DOB: [birth year], 67 y.o.   MRN: [id number]        Chief Complaint: follow up HTN, allergies, dm       HPI:  Cameron Cardenas is a 67 y.o. male here overall doing ok,  Pt denies chest pain, increased sob or doe, wheezing, orthopnea, PND, increased LE swelling, palpitations, dizziness or syncope.   Pt denies polydipsia, polyuria, or new focal neuro s/s.    Pt denies fever, wt loss, night sweats, loss of appetite, or other constitutional symptoms  Does have several wks ongoing nasal allergy symptoms with clearish congestion, itch and sneezing, without fever, pain, ST, swelling or wheezing but does have cough he attributes to the lisinopril    He has not tolerated losartan previously for unclear reason. Does have several wks ongoing nasal allergy symptoms with clearish congestion, itch and sneezing, without fever, pain, ST, cough, swelling or wheezing. .       Wt Readings from Last 3 Encounters:  01/10/23 213 lb (96.6 kg)  12/05/22 210 lb (95.3 kg)  09/01/22 205 lb (93 kg)   BP Readings from Last 3 Encounters:  01/10/23 120/70  12/05/22 112/72  09/01/22 132/86         Past Medical History:  Diagnosis Date   Allergic rhinitis due to pollen    Anal fissure    Bilateral sciatica 07/09/2018   Chronic pain syndrome 07/09/2018   Chronic, continuous use of opioids 07/09/2018   DDD (degenerative disc disease), lumbar    Essential hypertension, malignant    Hemorrhoids    Lumbago    Lumbar post-laminectomy syndrome 07/09/2018   Plantar fasciitis of left foot    Pure hypercholesterolemia    Spinal stenosis of lumbar region 07/09/2018   Type II or unspecified type diabetes mellitus without mention of complication, not stated as uncontrolled    dx in 2005   Unspecified vitamin D deficiency    Past Surgical History:  Procedure Laterality Date   COLONOSCOPY     in [facility], MD no longer in practice, does not recall the name of facility. Does belive polyps were removed    LUMBAR DISC SURGERY  2011   total of 3 sx on back per pt   MAXIMUM ACCESS (MAS)POSTERIOR LUMBAR INTERBODY FUSION (PLIF) 1 LEVEL N/A 06/10/2014   Procedure: Redo Lumbar Five-Sacral One Decompression with maximum access posterior lumbar interbody fusion;  Surgeon: Fairy Levels, MD;  Location: [facility];  Service: Neurosurgery;  Laterality: N/A;  Redo Decompression with maximum access posterior lumbar interbody fusion, L5-S1    reports that he has never smoked. He has never used smokeless tobacco. He reports that he does not currently use alcohol. He reports that he does not currently use drugs after having used the following drugs: Marijuana. family history includes Cataracts in his sister; Diabetes in his mother and sister; Hypertension in his father and mother; Kidney disease in his mother; Seizures in his brother. Allergies  Allergen Reactions   Losartan Potassium-Hctz Other (See Comments)    Headache   Current Outpatient Medications on File Prior to Visit  Medication Sig Dispense Refill   aspirin  81 MG tablet Take 81 mg by mouth daily.     Blood Glucose Monitoring Suppl (ONETOUCH VERIO) w/Device KIT Check blood sugars 3 times daily 1 kit 0   cyclobenzaprine  (FLEXERIL ) 10 MG tablet Take 1 tablet (10 mg total) by mouth 3 (three) times daily as needed for muscle spasms. 30 tablet 0  desloratadine  (CLARINEX ) 5 MG tablet Take 1 tablet (5 mg total) by mouth daily. 90 tablet 3   econazole nitrate 1 % cream Apply topically 2 (two) times daily.     ezetimibe  (ZETIA ) 10 MG tablet Take 1 tablet (10 mg total) by mouth daily. 90 tablet 1   FARXIGA  5 MG TABS tablet TAKE 1 TABLET BY MOUTH EVERY DAY IN THE MORNING 90 tablet 1   fluticasone  (FLONASE ) 50 MCG/ACT nasal spray Place 2 sprays into both nostrils daily. 16 g 6   GEMTESA 75 MG TABS Take 1 tablet by mouth daily.     glucose blood (ONETOUCH VERIO) test strip Check blood sugar 3 times daily 300 each 3   glucose blood (ONETOUCH VERIO) test strip USE  TO TEST BLOOD SUGARS ONCE DAILY 100 strip 2   ibuprofen (ADVIL) 600 MG tablet Take 600 mg by mouth 2 (two) times daily as needed.     ketoconazole  (NIZORAL ) 2 % cream as needed.     Lancets (ONETOUCH ULTRASOFT) lancets Check blood sugar 3 times daily 100 each 12   metFORMIN  (GLUCOPHAGE -XR) 500 MG 24 hr tablet TAKE 1 TABLET BY MOUTH TWICE A DAY 180 tablet 1   montelukast  (SINGULAIR ) 10 MG tablet TAKE 1 TABLET BY MOUTH EVERYDAY AT BEDTIME 90 tablet 1   neomycin-polymyxin b-dexamethasone (MAXITROL) 3.5-10000-0.1 SUSP 1 drop every 4 (four) hours.     OneTouch Delica Lancets 33G MISC Check blood sugars 3 times daily 300 each 3   promethazine -dextromethorphan (PROMETHAZINE -DM) 6.25-15 MG/5ML syrup Take 5 mLs by mouth 4 (four) times daily as needed. 118 mL 0   rosuvastatin  (CRESTOR ) 40 MG tablet Take 1 tablet (40 mg total) by mouth daily. 90 tablet 3   sildenafil  (REVATIO ) 20 MG tablet TAKE 2-3 TABLETS BY MOUTH AS NEEDED, AS DIRECTED DISCONTINUE IF HAVING HEADACHES. 90 tablet 1   tadalafil (CIALIS) 20 MG tablet Take 1 tablet PO three times per week     XIIDRA 5 % SOLN Apply 1 drop to eye 2 (two) times daily.     No current facility-administered medications on file prior to visit.        ROS:  All others reviewed and negative.  Objective        PE:  BP 120/70 (BP Location: Left Arm, Patient Position: Sitting, Cuff Size: Normal)   Pulse 70   Temp 97.7 F (36.5 C) (Oral)   Ht 6' 4 (1.93 m)   Wt 213 lb (96.6 kg)   SpO2 98%   BMI 25.93 kg/m                 Constitutional: Pt appears in NAD               HENT: Head: NCAT.                Right Ear: External ear normal.                 Left Ear: External ear normal. Bilat tm's with mild erythema.  Max sinus areas non tender.  Pharynx with mild erythema, no exudate               Eyes: . Pupils are equal, round, and reactive to light. Conjunctivae and EOM are normal               Nose: without d/c or deformity               Neck: Neck supple.  Gross normal ROM  Cardiovascular: Normal rate and regular rhythm.                 Pulmonary/Chest: Effort normal and breath sounds without rales or wheezing.                Abd:  Soft, NT, ND, + BS, no organomegaly               Neurological: Pt is alert. At baseline orientation, motor grossly intact               Skin: Skin is warm. No rashes, no other new lesions, LE edema - none               Psychiatric: Pt behavior is normal without agitation   Micro: none  Cardiac tracings I have personally interpreted today:  none  Pertinent Radiological findings (summarize): none   Lab Results  Component Value Date   WBC 2.4 Repeated and verified X2. (L) 08/02/2022   HGB 14.4 08/02/2022   HCT 44.8 08/02/2022   PLT 131.0 (L) 08/02/2022   GLUCOSE 113 (H) 11/25/2022   CHOL 161 11/25/2022   TRIG 62.0 11/25/2022   HDL 75.10 11/25/2022   LDLDIRECT 68.0 03/09/2022   LDLCALC 73 11/25/2022   ALT 22 08/02/2022   AST 20 08/02/2022   NA 143 11/25/2022   K 4.1 11/25/2022   CL 107 11/25/2022   CREATININE 1.03 11/25/2022   BUN 18 11/25/2022   CO2 29 11/25/2022   TSH 1.50 03/21/2018   INR 1.14 07/30/2009   HGBA1C 6.0 11/25/2022   MICROALBUR <0.7 08/02/2022   Assessment/Plan:  Cameron Cardenas is a 67 y.o. Black or African American [2] male with  has a past medical history of Allergic rhinitis due to pollen, Anal fissure, Bilateral sciatica (07/09/2018), Chronic pain syndrome (07/09/2018), Chronic, continuous use of opioids (07/09/2018), DDD (degenerative disc disease), lumbar, Essential hypertension, malignant, Hemorrhoids, Lumbago, Lumbar post-laminectomy syndrome (07/09/2018), Plantar fasciitis of left foot, Pure hypercholesterolemia, Spinal stenosis of lumbar region (07/09/2018), Type II or unspecified type diabetes mellitus without mention of complication, not stated as uncontrolled, and Unspecified vitamin D deficiency.  Type II diabetes mellitus with manifestations (HCC) Lab Results   Component Value Date   HGBA1C 6.0 11/25/2022   Stable, pt to continue current medical treatment farxiga  5 mg every day, metfomrin er 500 mg - 1 bid,    Essential hypertension Pt subjectively unable to tolerate lisinopril  due to cough - ok for d/c zestoretic , now for increased amlodipine  5 mg every day,, and continue hct 12.5 mg every day, has f/u appt next wk with PCP  Allergic rhinitis due to pollen Mild to mod, for astelin  nasal asd,,  to f/u any worsening symptoms or concerns  Followup: Return if symptoms worsen or fail to improve.  Lynwood Rush, MD 01/10/2023 9:25 PM [facility]

## 2023-01-10 NOTE — Patient Instructions (Signed)
 Please take all new medication as prescribed

## 2023-01-10 NOTE — Assessment & Plan Note (Signed)
Pt subjectively unable to tolerate lisinopril due to cough - ok for d/c zestoretic, now for increased amlodipine 5 mg every day,, and continue hct 12.5 mg every day, has f/u appt next wk with PCP

## 2023-01-10 NOTE — Assessment & Plan Note (Signed)
 Lab Results  Component Value Date   HGBA1C 6.0 11/25/2022   Stable, pt to continue current medical treatment farxiga 5 mg every day, metfomrin er 500 mg - 1 bid,

## 2023-01-16 ENCOUNTER — Encounter: Payer: 59 | Admitting: Internal Medicine

## 2023-01-27 ENCOUNTER — Ambulatory Visit (INDEPENDENT_AMBULATORY_CARE_PROVIDER_SITE_OTHER): Payer: 59 | Admitting: Internal Medicine

## 2023-01-27 ENCOUNTER — Encounter: Payer: Self-pay | Admitting: Internal Medicine

## 2023-01-27 VITALS — BP 124/80 | HR 72 | Temp 97.8°F | Ht 76.0 in | Wt 211.0 lb

## 2023-01-27 DIAGNOSIS — I10 Essential (primary) hypertension: Secondary | ICD-10-CM | POA: Diagnosis not present

## 2023-01-27 DIAGNOSIS — E118 Type 2 diabetes mellitus with unspecified complications: Secondary | ICD-10-CM | POA: Diagnosis not present

## 2023-01-27 DIAGNOSIS — Z23 Encounter for immunization: Secondary | ICD-10-CM | POA: Diagnosis not present

## 2023-01-27 DIAGNOSIS — Z0000 Encounter for general adult medical examination without abnormal findings: Secondary | ICD-10-CM | POA: Diagnosis not present

## 2023-01-27 DIAGNOSIS — C61 Malignant neoplasm of prostate: Secondary | ICD-10-CM

## 2023-01-27 DIAGNOSIS — E1169 Type 2 diabetes mellitus with other specified complication: Secondary | ICD-10-CM | POA: Diagnosis not present

## 2023-01-27 DIAGNOSIS — R300 Dysuria: Secondary | ICD-10-CM | POA: Diagnosis not present

## 2023-01-27 DIAGNOSIS — E785 Hyperlipidemia, unspecified: Secondary | ICD-10-CM | POA: Diagnosis not present

## 2023-01-27 LAB — URINALYSIS, ROUTINE W REFLEX MICROSCOPIC
Bilirubin Urine: NEGATIVE
Hgb urine dipstick: NEGATIVE
Leukocytes,Ua: NEGATIVE
Nitrite: NEGATIVE
Specific Gravity, Urine: 1.02 (ref 1.000–1.030)
Total Protein, Urine: NEGATIVE
Urine Glucose: >=1000 — AB
Urobilinogen, UA: 0.2 (ref 0.0–1.0)
pH: 6 (ref 5.0–8.0)

## 2023-01-27 MED ORDER — MONTELUKAST SODIUM 10 MG PO TABS: 10.0000 mg | ORAL_TABLET | Freq: Every day | ORAL | 3 refills | Status: AC

## 2023-01-27 MED ORDER — AMLODIPINE BESYLATE 5 MG PO TABS: 5.0000 mg | ORAL_TABLET | Freq: Every day | ORAL | 3 refills | Status: AC

## 2023-01-27 MED ORDER — SILDENAFIL CITRATE 50 MG PO TABS: 50.0000 mg | ORAL_TABLET | Freq: Every day | ORAL | 3 refills | Status: DC | PRN

## 2023-01-27 MED ORDER — HYDROCHLOROTHIAZIDE 12.5 MG PO CAPS: 12.5000 mg | ORAL_CAPSULE | Freq: Every day | ORAL | 3 refills | Status: AC

## 2023-01-27 NOTE — Assessment & Plan Note (Signed)
Recent surgery and needs U/A and culture per [facility] notes I reviewed. Ordered for him and done today.

## 2023-01-27 NOTE — Assessment & Plan Note (Signed)
 Lipid panel up-to-date and at goal

## 2023-01-27 NOTE — Assessment & Plan Note (Signed)
Flu shot up to date. Pneumonia given 20 today. Shingrix counseled. Tetanus counseled. Colonoscopy up to date. Counseled about sun safety and mole surveillance. Counseled about the dangers of distracted driving. Given 10 year screening recommendations.

## 2023-01-27 NOTE — Progress Notes (Signed)
   Subjective:   Patient ID: Cameron Cardenas, male    DOB: 05-09-1955, 68 y.o.   MRN: [id number]  HPI The patient is here for physical.  PMH, [facility], social history reviewed and updated  Review of Systems  Constitutional: Negative.   HENT: Negative.    Eyes: Negative.   Respiratory:  Negative for cough, chest tightness and shortness of breath.   Cardiovascular:  Negative for chest pain, palpitations and leg swelling.  Gastrointestinal:  Negative for abdominal distention, abdominal pain, constipation, diarrhea, nausea and vomiting.  Musculoskeletal: Negative.   Skin: Negative.   Neurological: Negative.   Psychiatric/Behavioral: Negative.      Objective:  Physical Exam Constitutional:      Appearance: He is well-developed.  HENT:     Head: Normocephalic and atraumatic.  Cardiovascular:     Rate and Rhythm: Normal rate and regular rhythm.  Pulmonary:     Effort: Pulmonary effort is normal. No respiratory distress.     Breath sounds: Normal breath sounds. No wheezing or rales.  Abdominal:     General: Bowel sounds are normal. There is no distension.     Palpations: Abdomen is soft.     Tenderness: There is no abdominal tenderness. There is no rebound.  Musculoskeletal:     Cervical back: Normal range of motion.  Skin:    General: Skin is warm and dry.  Neurological:     Mental Status: He is alert and oriented to person, place, and time.     Coordination: Coordination normal.     Vitals:   01/27/23 0847  BP: 124/80  Pulse: 72  Temp: 97.8 F (36.6 C)  TempSrc: Oral  SpO2: 98%  Weight: 211 lb (95.7 kg)  Height: 6\' 4"  (1.93 m)    Assessment & Plan:  Prevnar 20 given at visit

## 2023-01-27 NOTE — Assessment & Plan Note (Signed)
BP at goal on amlodipine 5 mg daily and hydrochlorothiazide 12.5 mg daily. Continue.

## 2023-01-27 NOTE — Assessment & Plan Note (Signed)
Labs up to date with endo and monitoring. He is up to date on screening.

## 2023-01-28 LAB — URINE CULTURE: Result:: NO GROWTH

## 2023-01-30 ENCOUNTER — Encounter: Payer: Self-pay | Admitting: Internal Medicine

## 2023-02-20 ENCOUNTER — Ambulatory Visit: Payer: 59 | Admitting: Dermatology

## 2023-02-21 ENCOUNTER — Ambulatory Visit: Payer: 59 | Admitting: Endocrinology

## 2023-02-23 DIAGNOSIS — Z9079 Acquired absence of other genital organ(s): Secondary | ICD-10-CM | POA: Diagnosis not present

## 2023-02-23 DIAGNOSIS — C61 Malignant neoplasm of prostate: Secondary | ICD-10-CM | POA: Diagnosis not present

## 2023-02-23 DIAGNOSIS — R300 Dysuria: Secondary | ICD-10-CM | POA: Diagnosis not present

## 2023-03-07 DIAGNOSIS — B309 Viral conjunctivitis, unspecified: Secondary | ICD-10-CM | POA: Diagnosis not present

## 2023-04-01 DIAGNOSIS — T162XXA Foreign body in left ear, initial encounter: Secondary | ICD-10-CM | POA: Diagnosis not present

## 2023-04-05 ENCOUNTER — Encounter: Payer: Self-pay | Admitting: Endocrinology

## 2023-04-05 ENCOUNTER — Ambulatory Visit (INDEPENDENT_AMBULATORY_CARE_PROVIDER_SITE_OTHER): Payer: 59 | Admitting: Endocrinology

## 2023-04-05 VITALS — BP 122/80 | HR 72 | Ht 76.0 in | Wt 210.0 lb

## 2023-04-05 DIAGNOSIS — E118 Type 2 diabetes mellitus with unspecified complications: Secondary | ICD-10-CM

## 2023-04-05 DIAGNOSIS — Z7984 Long term (current) use of oral hypoglycemic drugs: Secondary | ICD-10-CM

## 2023-04-05 DIAGNOSIS — E785 Hyperlipidemia, unspecified: Secondary | ICD-10-CM

## 2023-04-05 DIAGNOSIS — E1169 Type 2 diabetes mellitus with other specified complication: Secondary | ICD-10-CM | POA: Diagnosis not present

## 2023-04-05 LAB — POCT GLYCOSYLATED HEMOGLOBIN (HGB A1C): Hemoglobin A1C: 6.1 % — AB (ref 4.0–5.6)

## 2023-04-05 MED ORDER — FARXIGA 5 MG PO TABS: ORAL_TABLET | ORAL | 3 refills | Status: DC

## 2023-04-05 MED ORDER — ROSUVASTATIN CALCIUM 40 MG PO TABS: 40.0000 mg | ORAL_TABLET | Freq: Every day | ORAL | 3 refills | Status: DC

## 2023-04-05 MED ORDER — PIOGLITAZONE HCL 30 MG PO TABS: 30.0000 mg | ORAL_TABLET | Freq: Every day | ORAL | 3 refills | Status: DC

## 2023-04-05 MED ORDER — METFORMIN HCL ER 500 MG PO TB24: 500.0000 mg | ORAL_TABLET | Freq: Two times a day (BID) | ORAL | 3 refills | Status: DC

## 2023-04-05 MED ORDER — EZETIMIBE 10 MG PO TABS
10.0000 mg | ORAL_TABLET | Freq: Every day | ORAL | 3 refills | Status: AC
Start: 2023-04-05 — End: ?

## 2023-04-05 NOTE — Progress Notes (Signed)
 Outpatient Endocrinology Note [place] Katrese Shell, MD  04/05/23  Patient's Name: Cameron Cardenas    DOB: May 14, 1955    MRN: [id number]                                                    REASON OF VISIT: Follow up for type 2 diabetes mellitus  PCP: Myrlene Broker, MD  HISTORY OF PRESENT ILLNESS:   CRIST KRUSZKA is a 68 y.o. old male with past medical history listed below, is here for follow up of type 2 diabetes mellitus.  Patient was last seen by Dr. Lucianne Muss in March 2024.  Pertinent Diabetes History: Patient was diagnosed with type 2 diabetes mellitus in 2004.  He was initially treated with metformin and subsequently Actoplusmet.  In 2013 he changed his diet significantly and he started exercising.  This helped him with weight loss and subsequently good sugar control and A1c has been in upper normal range.  Chronic Diabetes Complications : Retinopathy: yes. Last ophthalmology exam was done on annually, at [facility].  Nephropathy: no Peripheral neuropathy: no Coronary artery disease: no Stroke: no  Relevant comorbidities and cardiovascular risk factors: Obesity: yes Body mass index is 25.56 kg/m.  Hypertension: yes Hyperlipidemia. Yes, on statin and ezetimibe  Current / Home Diabetic regimen includes: Actos 30 mg daily. Metformin XR 500 mg 2 times a day. Farxiga 5 mg daily.  Prior diabetic medications:  Glycemic data:   He forgot to bring glucometer in the clinic today. he uses One Touch glucometer.  Factors modifying glucose control: 1.  Diabetic diet assessment: Eating healthy.  2.  Staying active or exercising: Regular exercise.  3.  Medication compliance: compliant all of the time.  Interval history  Hemoglobin A1c 6.1% today.  Diabetes has been reviewed and as noted above.  He denies numbness and ting of the feet.  No vision problem.  No other complaints today.  REVIEW OF SYSTEMS As per history of present illness.   PAST MEDICAL HISTORY: Past  Medical History:  Diagnosis Date   Allergic rhinitis due to pollen    Anal fissure    Bilateral sciatica 07/09/2018   Chronic pain syndrome 07/09/2018   Chronic, continuous use of opioids 07/09/2018   DDD (degenerative disc disease), lumbar    Essential hypertension, malignant    Hemorrhoids    Lumbago    Lumbar post-laminectomy syndrome 07/09/2018   Plantar fasciitis of left foot    Pure hypercholesterolemia    Spinal stenosis of lumbar region 07/09/2018   Type II or unspecified type diabetes mellitus without mention of complication, not stated as uncontrolled    dx in 2005   Unspecified vitamin D deficiency     PAST SURGICAL HISTORY: Past Surgical History:  Procedure Laterality Date   COLONOSCOPY     in [facility], MD no longer in practice, does not recall the name of facility. Does belive polyps were removed   LUMBAR DISC SURGERY  2011   total of 3 sx on back per pt   MAXIMUM ACCESS (MAS)POSTERIOR LUMBAR INTERBODY FUSION (PLIF) 1 LEVEL N/A 06/10/2014   Procedure: Redo Lumbar Five-Sacral One Decompression with maximum access posterior lumbar interbody fusion;  Surgeon: Maeola Harman, MD;  Location: [facility];  Service: Neurosurgery;  Laterality: N/A;  Redo Decompression with maximum access posterior lumbar interbody fusion,  L5-S1    ALLERGIES: Allergies  Allergen Reactions   Losartan Potassium-Hctz Other (See Comments)    Headache    FAMILY HISTORY:  Family History  Problem Relation Age of Onset   Hypertension Mother    Kidney disease Mother    Diabetes Mother    Hypertension Father    Diabetes Sister    Cataracts Sister    Seizures Brother        caused his death   Colon cancer Neg Hx    Esophageal cancer Neg Hx    Rectal cancer Neg Hx    Stomach cancer Neg Hx    Heart disease Neg Hx    Colon polyps Neg Hx     SOCIAL HISTORY: Social History   Socioeconomic History   Marital status: Married    Spouse name: Not on file   Number of children: 1   Years of education:  12   Highest education level: High school graduate  Occupational History   Occupation: disabled  Tobacco Use   Smoking status: Never   Smokeless tobacco: Never  Vaping Use   Vaping status: Never Used  Substance and Sexual Activity   Alcohol use: Not Currently    Comment: 6 pack beer lasts a month   Drug use: Not Currently    Types: Marijuana   Sexual activity: Not on file  Other Topics Concern   Not on file  Social History Narrative   Lives with wife in a one story home.  Has one child.     He is on disability since January 2020, stopped working in February 2019 for low back pain.  Former Community education officer.     Education: high school.     Social Drivers of Corporate investment banker Strain: Low Risk  (10/14/2022)   Received from [facility]   Overall Financial Resource Strain (CARDIA)    Difficulty of Paying Living Expenses: Not hard at all  Food Insecurity: No Food Insecurity (10/14/2022)   Received from [facility]   Hunger Vital Sign    Worried About Running Out of Food in the Last Year: Never true    Ran Out of Food in the Last Year: Never true  Transportation Needs: No Transportation Needs (10/14/2022)   Received from [facility] - Transportation    In the past 12 months, has lack of transportation kept you from medical appointments or from getting medications?: No    Lack of Transportation (Non-Medical): No  Physical Activity: Not on file  Stress: Not on file  Social Connections: Not on file    MEDICATIONS:  Current Outpatient Medications  Medication Sig Dispense Refill   amLODipine (NORVASC) 5 MG tablet Take 1 tablet (5 mg total) by mouth daily. 90 tablet 3   aspirin 81 MG tablet Take 81 mg by mouth daily.     azelastine (ASTELIN) 0.1 % nasal spray Place 2 sprays into both nostrils 2 (two) times daily. Use in each nostril as directed 30 mL 12   Blood Glucose Monitoring Suppl (ONETOUCH VERIO) w/Device KIT  Check blood sugars 3 times daily 1 kit 0   cyclobenzaprine (FLEXERIL) 10 MG tablet Take 1 tablet (10 mg total) by mouth 3 (three) times daily as needed for muscle spasms. 30 tablet 0   desloratadine (CLARINEX) 5 MG tablet Take 1 tablet (5 mg total) by mouth daily. 90 tablet 3   econazole nitrate 1 % cream Apply topically 2 (  two) times daily.     famotidine (PEPCID) 20 MG tablet TAKE 1 TABLET BY MOUTH TWICE A DAY 180 tablet 1   fluticasone (FLONASE) 50 MCG/ACT nasal spray Place 2 sprays into both nostrils daily. 16 g 6   GEMTESA 75 MG TABS Take 1 tablet by mouth daily.     glucose blood (ONETOUCH VERIO) test strip Check blood sugar 3 times daily 300 each 3   glucose blood (ONETOUCH VERIO) test strip USE TO TEST BLOOD SUGARS ONCE DAILY 100 strip 2   hydrochlorothiazide (MICROZIDE) 12.5 MG capsule Take 1 capsule (12.5 mg total) by mouth daily. 90 capsule 3   ibuprofen (ADVIL) 600 MG tablet Take 600 mg by mouth 2 (two) times daily as needed.     ketoconazole (NIZORAL) 2 % cream as needed.     Lancets (ONETOUCH ULTRASOFT) lancets Check blood sugar 3 times daily 100 each 12   montelukast (SINGULAIR) 10 MG tablet Take 1 tablet (10 mg total) by mouth at bedtime. 90 tablet 3   neomycin-polymyxin b-dexamethasone (MAXITROL) 3.5-10000-0.1 SUSP 1 drop every 4 (four) hours.     OneTouch Delica Lancets 33G MISC Check blood sugars 3 times daily 300 each 3   pioglitazone (ACTOS) 30 MG tablet Take 1 tablet (30 mg total) by mouth daily. 90 tablet 3   promethazine-dextromethorphan (PROMETHAZINE-DM) 6.25-15 MG/5ML syrup Take 5 mLs by mouth 4 (four) times daily as needed. 118 mL 0   sildenafil (REVATIO) 20 MG tablet TAKE 2-3 TABLETS BY MOUTH AS NEEDED, AS DIRECTED DISCONTINUE IF HAVING HEADACHES. 90 tablet 1   sildenafil (VIAGRA) 50 MG tablet Take 1 tablet (50 mg total) by mouth daily as needed for erectile dysfunction. 90 tablet 3   tadalafil (CIALIS) 20 MG tablet Take 1 tablet PO three times per week     XIIDRA 5 %  SOLN Apply 1 drop to eye 2 (two) times daily.     ezetimibe (ZETIA) 10 MG tablet Take 1 tablet (10 mg total) by mouth daily. 90 tablet 3   FARXIGA 5 MG TABS tablet TAKE 1 TABLET BY MOUTH EVERY DAY IN THE MORNING 90 tablet 3   metFORMIN (GLUCOPHAGE-XR) 500 MG 24 hr tablet Take 1 tablet (500 mg total) by mouth 2 (two) times daily. 180 tablet 3   rosuvastatin (CRESTOR) 40 MG tablet Take 1 tablet (40 mg total) by mouth daily. 90 tablet 3   No current facility-administered medications for this visit.    PHYSICAL EXAM: Vitals:   04/05/23 0802  BP: 122/80  Pulse: 72  SpO2: 97%  Weight: 210 lb (95.3 kg)  Height: 6\' 4"  (1.93 m)   Body mass index is 25.56 kg/m.  Wt Readings from Last 3 Encounters:  04/05/23 210 lb (95.3 kg)  01/27/23 211 lb (95.7 kg)  01/10/23 213 lb (96.6 kg)    General: Well developed, well nourished male in no apparent distress.  HEENT: AT/[place], no external lesions.  Eyes: Conjunctiva clear and no icterus. Neck: Neck supple  Lungs: Respirations not labored Neurologic: Alert, oriented, normal speech Extremities / Skin: Dry. No sores or rashes noted. No acanthosis nigricans Psychiatric: Does not appear depressed or anxious  Diabetic Foot Exam - Simple   No data filed    LABS Reviewed Lab Results  Component Value Date   HGBA1C 6.1 (A) 04/05/2023   HGBA1C 6.0 11/25/2022   HGBA1C 6.2 08/02/2022   No results found for: "FRUCTOSAMINE" Lab Results  Component Value Date   CHOL 161 11/25/2022   HDL  75.10 11/25/2022   LDLCALC 73 11/25/2022   LDLDIRECT 68.0 03/09/2022   TRIG 62.0 11/25/2022   CHOLHDL 2 11/25/2022   Lab Results  Component Value Date   MICRALBCREAT 0.6 08/02/2022   MICRALBCREAT 1.0 08/30/2021   Lab Results  Component Value Date   CREATININE 1.03 11/25/2022   Lab Results  Component Value Date   GFR 74.73 11/25/2022    ASSESSMENT / PLAN  1. Controlled type 2 diabetes mellitus with complication, without long-term current use of insulin  (HCC)   2. Hyperlipidemia associated with type 2 diabetes mellitus (HCC)     Diabetes Mellitus type 2, complicated by possibly retinopathy. - Diabetic status / severity: Controlled  Lab Results  Component Value Date   HGBA1C 6.1 (A) 04/05/2023    - Hemoglobin A1c goal : <6.5%  - Medications: No change  I) continue Actos 30 mg daily. II) continue metformin XR 500 mg 2 times a day. III) continue Farxiga 5 mg daily.  - Home glucose testing: Daily in the morning fasting and occasionally at bedtime.  Asked to bring glucometer in the follow-up visit. - Discussed/ Gave Hypoglycemia treatment plan.  # Annual urine for microalbuminuria/ creatinine ratio, no microalbuminuria currently. Last  Lab Results  Component Value Date   MICRALBCREAT 0.6 08/02/2022    # Foot check nightly / neuropathy.  # Patient reports he has mild diabetic retinopathy, no records available to review, and annual follow-up with ophthalmology.  - Diet: Eat reasonable portion sizes to promote a healthy weight - Life style / activity / exercise: Discussed.   2. Blood pressure  -  BP Readings from Last 1 Encounters:  04/05/23 122/80    - Control is in target.  - No change in current plans.  3. Lipid status / Hyperlipidemia - Last  Lab Results  Component Value Date   LDLCALC 73 11/25/2022   Continue rosuvastatin 40 mg daily.  Continue Zetia 10 mg daily.  Abdifatah was seen today for follow-up.  Diagnoses and all orders for this visit:  Controlled type 2 diabetes mellitus with complication, without long-term current use of insulin (HCC) -     POCT glycosylated hemoglobin (Hb A1C) -     FARXIGA 5 MG TABS tablet; TAKE 1 TABLET BY MOUTH EVERY DAY IN THE MORNING -     metFORMIN (GLUCOPHAGE-XR) 500 MG 24 hr tablet; Take 1 tablet (500 mg total) by mouth 2 (two) times daily. -     pioglitazone (ACTOS) 30 MG tablet; Take 1 tablet (30 mg total) by mouth daily.  Hyperlipidemia associated with type 2 diabetes  mellitus (HCC) -     rosuvastatin (CRESTOR) 40 MG tablet; Take 1 tablet (40 mg total) by mouth daily. -     ezetimibe (ZETIA) 10 MG tablet; Take 1 tablet (10 mg total) by mouth daily.    DISPOSITION Follow up in clinic in 6  months suggested.  Labs on the same day of the visit.   All questions answered and patient verbalized understanding of the plan.  [place] Sherrice Creekmore, MD [facility] Endocrinology [facility] [address] [place], [address] [place], [place] [address] Phone # [phone]  At least part of this note was generated using voice recognition software. Inadvertent word errors may have occurred, which were not recognized during the proofreading process.

## 2023-04-07 ENCOUNTER — Encounter (INDEPENDENT_AMBULATORY_CARE_PROVIDER_SITE_OTHER): Payer: Self-pay | Admitting: Ophthalmology

## 2023-04-07 ENCOUNTER — Ambulatory Visit (INDEPENDENT_AMBULATORY_CARE_PROVIDER_SITE_OTHER): Payer: BC Managed Care – PPO | Admitting: Ophthalmology

## 2023-04-07 DIAGNOSIS — Z7984 Long term (current) use of oral hypoglycemic drugs: Secondary | ICD-10-CM | POA: Diagnosis not present

## 2023-04-07 DIAGNOSIS — H35033 Hypertensive retinopathy, bilateral: Secondary | ICD-10-CM

## 2023-04-07 DIAGNOSIS — Z961 Presence of intraocular lens: Secondary | ICD-10-CM | POA: Diagnosis not present

## 2023-04-07 DIAGNOSIS — E119 Type 2 diabetes mellitus without complications: Secondary | ICD-10-CM

## 2023-04-07 DIAGNOSIS — I10 Essential (primary) hypertension: Secondary | ICD-10-CM | POA: Diagnosis not present

## 2023-04-07 NOTE — Progress Notes (Signed)
 [facility] - Clinic Note  04/07/2023   CHIEF COMPLAINT Patient presents for Retina Evaluation  HISTORY OF PRESENT ILLNESS: Cameron Cardenas is a 68 y.o. male who presents to the clinic today for:  HPI     Retina Evaluation   In both eyes.  This started 2 days ago.  Duration of 2 days.  Associated Symptoms Floaters, Pain and Redness.  I, the attending physician,  performed the HPI with the patient and updated documentation appropriately.        Comments   Retina eval per [place] Thapa MD for diabetic exam pt is reporting that he has been redness in both eye denies any watering burning or itching pt is using systane few times per day is not helping he denies any flashes or floaters and has not noticed any vision changes last reading 137 6.1 A1C       Last edited by Rennis Chris, MD on 04/07/2023 12:41 PM.    Pt is here for DM exam, his last A1c was 6.1 on 03.26.25, he is on metformin and Farxiga, he states he needs glasses for reading and he is seeing floaters, he states about 2 days ago, he woke up and his right eye was sore, he states it is getting better, he does not use any eye drops or have any eye problems   Referring physician: Gaspar Garbe, MD [address] [place],  [place] [address]  HISTORICAL INFORMATION:  Selected notes from the MEDICAL RECORD NUMBER Referred by Dr. Guerry Bruin for DM exam LEE:  Ocular Hx- PMH-   CURRENT MEDICATIONS: Current Outpatient Medications (Ophthalmic Drugs)  Medication Sig   neomycin-polymyxin b-dexamethasone (MAXITROL) 3.5-10000-0.1 SUSP 1 drop every 4 (four) hours.   XIIDRA 5 % SOLN Apply 1 drop to eye 2 (two) times daily.   No current facility-administered medications for this visit. (Ophthalmic Drugs)   Current Outpatient Medications (Other)  Medication Sig   amLODipine (NORVASC) 5 MG tablet Take 1 tablet (5 mg total) by mouth daily.   aspirin 81 MG tablet Take 81 mg by mouth daily.   azelastine  (ASTELIN) 0.1 % nasal spray Place 2 sprays into both nostrils 2 (two) times daily. Use in each nostril as directed   Blood Glucose Monitoring Suppl (ONETOUCH VERIO) w/Device KIT Check blood sugars 3 times daily   cyclobenzaprine (FLEXERIL) 10 MG tablet Take 1 tablet (10 mg total) by mouth 3 (three) times daily as needed for muscle spasms.   desloratadine (CLARINEX) 5 MG tablet Take 1 tablet (5 mg total) by mouth daily.   econazole nitrate 1 % cream Apply topically 2 (two) times daily.   ezetimibe (ZETIA) 10 MG tablet Take 1 tablet (10 mg total) by mouth daily.   famotidine (PEPCID) 20 MG tablet TAKE 1 TABLET BY MOUTH TWICE A DAY   FARXIGA 5 MG TABS tablet TAKE 1 TABLET BY MOUTH EVERY DAY IN THE MORNING   fluticasone (FLONASE) 50 MCG/ACT nasal spray Place 2 sprays into both nostrils daily.   GEMTESA 75 MG TABS Take 1 tablet by mouth daily.   glucose blood (ONETOUCH VERIO) test strip Check blood sugar 3 times daily   glucose blood (ONETOUCH VERIO) test strip USE TO TEST BLOOD SUGARS ONCE DAILY   hydrochlorothiazide (MICROZIDE) 12.5 MG capsule Take 1 capsule (12.5 mg total) by mouth daily.   ibuprofen (ADVIL) 600 MG tablet Take 600 mg by mouth 2 (two) times daily as needed.   ketoconazole (NIZORAL) 2 %  cream as needed.   Lancets (ONETOUCH ULTRASOFT) lancets Check blood sugar 3 times daily   metFORMIN (GLUCOPHAGE-XR) 500 MG 24 hr tablet Take 1 tablet (500 mg total) by mouth 2 (two) times daily.   montelukast (SINGULAIR) 10 MG tablet Take 1 tablet (10 mg total) by mouth at bedtime.   OneTouch Delica Lancets 33G MISC Check blood sugars 3 times daily   pioglitazone (ACTOS) 30 MG tablet Take 1 tablet (30 mg total) by mouth daily.   promethazine-dextromethorphan (PROMETHAZINE-DM) 6.25-15 MG/5ML syrup Take 5 mLs by mouth 4 (four) times daily as needed.   rosuvastatin (CRESTOR) 40 MG tablet Take 1 tablet (40 mg total) by mouth daily.   sildenafil (REVATIO) 20 MG tablet TAKE 2-3 TABLETS BY MOUTH AS NEEDED,  AS DIRECTED DISCONTINUE IF HAVING HEADACHES.   sildenafil (VIAGRA) 50 MG tablet Take 1 tablet (50 mg total) by mouth daily as needed for erectile dysfunction.   tadalafil (CIALIS) 20 MG tablet Take 1 tablet PO three times per week   No current facility-administered medications for this visit. (Other)   REVIEW OF SYSTEMS:  ALLERGIES Allergies  Allergen Reactions   Losartan Potassium-Hctz Other (See Comments)    Headache   PAST MEDICAL HISTORY Past Medical History:  Diagnosis Date   Allergic rhinitis due to pollen    Anal fissure    Bilateral sciatica 07/09/2018   Chronic pain syndrome 07/09/2018   Chronic, continuous use of opioids 07/09/2018   DDD (degenerative disc disease), lumbar    Essential hypertension, malignant    Hemorrhoids    Lumbago    Lumbar post-laminectomy syndrome 07/09/2018   Plantar fasciitis of left foot    Pure hypercholesterolemia    Spinal stenosis of lumbar region 07/09/2018   Type II or unspecified type diabetes mellitus without mention of complication, not stated as uncontrolled    dx in 2005   Unspecified vitamin D deficiency    Past Surgical History:  Procedure Laterality Date   CATARACT EXTRACTION  2010   COLONOSCOPY     in [facility], MD no longer in practice, does not recall the name of facility. Does belive polyps were removed   LUMBAR DISC SURGERY  2011   total of 3 sx on back per pt   MAXIMUM ACCESS (MAS)POSTERIOR LUMBAR INTERBODY FUSION (PLIF) 1 LEVEL N/A 06/10/2014   Procedure: Redo Lumbar Five-Sacral One Decompression with maximum access posterior lumbar interbody fusion;  Surgeon: Maeola Harman, MD;  Location: [facility];  Service: Neurosurgery;  Laterality: N/A;  Redo Decompression with maximum access posterior lumbar interbody fusion, L5-S1   FAMILY HISTORY Family History  Problem Relation Age of Onset   Hypertension Mother    Kidney disease Mother    Diabetes Mother    Hypertension Father    Diabetes Sister    Cataracts Sister     Seizures Brother        caused his death   Colon cancer Neg Hx    Esophageal cancer Neg Hx    Rectal cancer Neg Hx    Stomach cancer Neg Hx    Heart disease Neg Hx    Colon polyps Neg Hx    SOCIAL HISTORY Social History   Tobacco Use   Smoking status: Never   Smokeless tobacco: Never  Vaping Use   Vaping status: Never Used  Substance Use Topics   Alcohol use: Not Currently    Comment: 6 pack beer lasts a month   Drug use: Not Currently    Types: Marijuana  OPHTHALMIC EXAM:  Base Eye Exam     Visual Acuity (Snellen - Linear)       Right Left   Dist [place] 20/25 -1 20/20 -1   Dist ph [place] NI          Tonometry (Tonopen, 9:32 AM)       Right Left   Pressure 21 23  Squeezing         Pupils       Pupils Dark Light Shape React APD   Right PERRL 3 2 Round Brisk None   Left PERRL 3 2 Round Brisk None         Visual Fields       Left Right    Full Full         Extraocular Movement       Right Left    Full, Ortho Full, Ortho         Neuro/Psych     Oriented x3: Yes   Mood/Affect: Normal         Dilation     Both eyes: 2.5% Phenylephrine @ 9:32 AM           Slit Lamp and Fundus Exam     Slit Lamp Exam       Right Left   Lids/Lashes Normal Normal   Conjunctiva/Sclera mild melanosis, nasal and temporal pinguecula mild melanosis, nasal and temporal pinguecula   Cornea arcus, trace PEE, trace tear film debris arcus, trace PEE, trace tear film debris   Anterior Chamber deep and clear deep and clear   Iris Round and dilated Round and dilated   Lens PC IOL in good position with open PC PC IOL in good position with open PC   Anterior Vitreous mild syneresis mild syneresis         Fundus Exam       Right Left   Disc Pink and Sharp Pink and Sharp, +PPP   C/D Ratio 0.3 0.4   Macula Flat, Good foveal reflex, No heme or edema Flat, Good foveal reflex, No heme or edema   Vessels mild attenuation mild attenuation   Periphery  Attached, No heme Attached, No heme           IMAGING AND PROCEDURES  Imaging and Procedures for 04/07/2023  OCT, Retina - OU - Both Eyes       Right Eye Quality was good. Central Foveal Thickness: 237. Progression has no prior data. Findings include normal foveal contour, no IRF, no SRF.   Left Eye Quality was good. Central Foveal Thickness: 236. Progression has no prior data. Findings include normal foveal contour, no IRF, no SRF.   Notes *Images captured and stored on drive  Diagnosis / Impression:  NFP, no IRF/SRF OU No DME OU  Clinical management:  See below  Abbreviations: NFP - Normal foveal profile. CME - cystoid macular edema. PED - pigment epithelial detachment. IRF - intraretinal fluid. SRF - subretinal fluid. EZ - ellipsoid zone. ERM - epiretinal membrane. ORA - outer retinal atrophy. ORT - outer retinal tubulation. SRHM - subretinal hyper-reflective material. IRHM - intraretinal hyper-reflective material           ASSESSMENT/PLAN:   ICD-10-CM   1. Diabetes mellitus type 2 without retinopathy (HCC)  E11.9 OCT, Retina - OU - Both Eyes    2. Long term (current) use of oral hypoglycemic drugs  Z79.84     3. Essential hypertension  I10     4. Hypertensive retinopathy of  both eyes  H35.033     5. Pseudophakia, both eyes  Z96.1      1,2. Diabetes mellitus, type 2 without retinopathy  - A1c: 6.1 on 03.26.25 - The incidence, risk factors for progression, natural history and treatment options for diabetic retinopathy  were discussed with patient.   - The need for close monitoring of blood glucose, blood pressure, and serum lipids, avoiding cigarette or any type of tobacco, and the need for long term follow up was also discussed with patient. - f/u in 1 year, sooner prn  3,4. Hypertensive retinopathy OU - discussed importance of tight BP control - monitor  5. Pseudophakia OU  - s/p CE/IOL OU (OD: [facility], [facility], OS: [facility])  - IOLs in good position, doing  well  - monitor  Ophthalmic Meds Ordered this visit:  No orders of the defined types were placed in this encounter.    Return in about 1 year (around 04/06/2024) for f/u DM exam, DFE, OCT.  There are no Patient Instructions on file for this visit.  Explained the diagnoses, plan, and follow up with the patient and they expressed understanding.  Patient expressed understanding of the importance of proper follow up care.   This document serves as a record of services personally performed by Karie Chimera, MD, PhD. It was created on their behalf by Glee Arvin. Manson Passey, OA an ophthalmic technician. The creation of this record is the provider's dictation and/or activities during the visit.    Electronically signed by: Glee Arvin. Manson Passey, OA 04/07/23 12:42 PM  Karie Chimera, M.D., Ph.D. Diseases & Surgery of the Retina and Vitreous [facility] 04/07/2023  I have reviewed the above documentation for accuracy and completeness, and I agree with the above. Karie Chimera, M.D., Ph.D. 04/07/23 12:43 PM   Abbreviations: M myopia (nearsighted); A astigmatism; H hyperopia (farsighted); P presbyopia; Mrx spectacle prescription;  CTL contact lenses; OD right eye; OS left eye; OU both eyes  XT exotropia; ET esotropia; PEK punctate epithelial keratitis; PEE punctate epithelial erosions; DES dry eye syndrome; MGD meibomian gland dysfunction; ATs artificial tears; PFAT's preservative free artificial tears; NSC nuclear sclerotic cataract; PSC posterior subcapsular cataract; ERM epi-retinal membrane; PVD posterior vitreous detachment; RD retinal detachment; DM diabetes mellitus; DR diabetic retinopathy; NPDR non-proliferative diabetic retinopathy; PDR proliferative diabetic retinopathy; CSME clinically significant macular edema; DME diabetic macular edema; dbh dot blot hemorrhages; CWS cotton wool spot; POAG primary open angle glaucoma; C/D cup-to-disc ratio; HVF humphrey visual field; GVF  goldmann visual field; OCT optical coherence tomography; IOP intraocular pressure; BRVO Branch retinal vein occlusion; CRVO central retinal vein occlusion; CRAO central retinal artery occlusion; BRAO branch retinal artery occlusion; RT retinal tear; SB scleral buckle; PPV pars plana vitrectomy; VH Vitreous hemorrhage; PRP panretinal laser photocoagulation; IVK intravitreal kenalog; VMT vitreomacular traction; MH Macular hole;  NVD neovascularization of the disc; NVE neovascularization elsewhere; AREDS age related eye disease study; ARMD age related macular degeneration; POAG primary open angle glaucoma; EBMD epithelial/anterior basement membrane dystrophy; ACIOL anterior chamber intraocular lens; IOL intraocular lens; PCIOL posterior chamber intraocular lens; Phaco/IOL phacoemulsification with intraocular lens placement; PRK photorefractive keratectomy; LASIK laser assisted in situ keratomileusis; HTN hypertension; DM diabetes mellitus; COPD chronic obstructive pulmonary disease

## 2023-05-08 ENCOUNTER — Telehealth: Payer: Self-pay | Admitting: Internal Medicine

## 2023-05-08 NOTE — Telephone Encounter (Signed)
 Copied from CRM [id number]. Topic: General - Inquiry >> May 08, 2023  2:05 PM [place] wrote: Reason for CRM: patient misplaced the paperwork to get his handicap sticker replaced, Please reach out as soon as possible

## 2023-05-08 NOTE — Telephone Encounter (Signed)
 Patient here to pick up new parking placard form

## 2023-05-18 ENCOUNTER — Ambulatory Visit

## 2023-06-25 ENCOUNTER — Other Ambulatory Visit: Payer: Self-pay | Admitting: Internal Medicine

## 2023-07-23 ENCOUNTER — Other Ambulatory Visit: Payer: Self-pay | Admitting: Internal Medicine

## 2023-08-04 ENCOUNTER — Ambulatory Visit: Payer: 59 | Admitting: Internal Medicine

## 2023-08-04 ENCOUNTER — Other Ambulatory Visit (INDEPENDENT_AMBULATORY_CARE_PROVIDER_SITE_OTHER): Payer: Self-pay | Admitting: Otolaryngology

## 2023-08-09 ENCOUNTER — Telehealth: Payer: Self-pay

## 2023-08-09 ENCOUNTER — Other Ambulatory Visit (HOSPITAL_COMMUNITY): Payer: Self-pay

## 2023-08-09 NOTE — Telephone Encounter (Signed)
 Pharmacy Patient Advocate Encounter   Received notification from CoverMyMeds that prior authorization for Sildenafil  Citrate (PAH) 20MG  tablets  is required/requested.   Insurance verification completed.   The patient is insured through [employer] [facility] .   Per test claim: PA required; PA submitted to above mentioned insurance via CoverMyMeds Key/confirmation #/EOC [id number] Status is pending

## 2023-08-09 NOTE — Telephone Encounter (Signed)
 Pharmacy Patient Advocate Encounter  Received notification from [employer] [facility] that Prior Authorization for Sildenafil  Citrate (PAH) 20MG  tablets  has been DENIED.  See denial reason below. No denial letter attached in CMM. Will attach denial letter to Media tab once received.   PA #/Case ID/Reference #: [id number]

## 2023-08-11 ENCOUNTER — Ambulatory Visit: Admitting: Internal Medicine

## 2023-08-22 ENCOUNTER — Ambulatory Visit: Admitting: Internal Medicine

## 2023-09-25 ENCOUNTER — Ambulatory Visit (INDEPENDENT_AMBULATORY_CARE_PROVIDER_SITE_OTHER): Admitting: Internal Medicine

## 2023-09-25 ENCOUNTER — Encounter: Payer: Self-pay | Admitting: Internal Medicine

## 2023-09-25 VITALS — BP 122/80 | HR 72 | Temp 97.6°F | Resp 20 | Ht 76.0 in | Wt 213.0 lb

## 2023-09-25 DIAGNOSIS — M25512 Pain in left shoulder: Secondary | ICD-10-CM

## 2023-09-25 DIAGNOSIS — D72819 Decreased white blood cell count, unspecified: Secondary | ICD-10-CM | POA: Diagnosis not present

## 2023-09-25 DIAGNOSIS — H109 Unspecified conjunctivitis: Secondary | ICD-10-CM | POA: Insufficient documentation

## 2023-09-25 DIAGNOSIS — E118 Type 2 diabetes mellitus with unspecified complications: Secondary | ICD-10-CM | POA: Diagnosis not present

## 2023-09-25 DIAGNOSIS — I10 Essential (primary) hypertension: Secondary | ICD-10-CM | POA: Diagnosis not present

## 2023-09-25 DIAGNOSIS — E785 Hyperlipidemia, unspecified: Secondary | ICD-10-CM

## 2023-09-25 DIAGNOSIS — N529 Male erectile dysfunction, unspecified: Secondary | ICD-10-CM

## 2023-09-25 DIAGNOSIS — E1169 Type 2 diabetes mellitus with other specified complication: Secondary | ICD-10-CM

## 2023-09-25 DIAGNOSIS — H1033 Unspecified acute conjunctivitis, bilateral: Secondary | ICD-10-CM

## 2023-09-25 LAB — CBC
HCT: 43.6 % (ref 39.0–52.0)
Hemoglobin: 14.3 g/dL (ref 13.0–17.0)
MCHC: 32.9 g/dL (ref 30.0–36.0)
MCV: 84.5 fl (ref 78.0–100.0)
Platelets: 117 K/uL — ABNORMAL LOW (ref 150.0–400.0)
RBC: 5.16 Mil/uL (ref 4.22–5.81)
RDW: 15.4 % (ref 11.5–15.5)
WBC: 2.6 K/uL — ABNORMAL LOW (ref 4.0–10.5)

## 2023-09-25 LAB — COMPREHENSIVE METABOLIC PANEL WITH GFR
ALT: 45 U/L (ref 0–53)
AST: 28 U/L (ref 0–37)
Albumin: 4.3 g/dL (ref 3.5–5.2)
Alkaline Phosphatase: 46 U/L (ref 39–117)
BUN: 17 mg/dL (ref 6–23)
CO2: 29 meq/L (ref 19–32)
Calcium: 9.1 mg/dL (ref 8.4–10.5)
Chloride: 105 meq/L (ref 96–112)
Creatinine, Ser: 1.06 mg/dL (ref 0.40–1.50)
GFR: 71.78 mL/min (ref >60.00)
Glucose, Bld: 103 mg/dL — ABNORMAL HIGH (ref 70–99)
Potassium: 4 meq/L (ref 3.5–5.1)
Sodium: 142 meq/L (ref 135–145)
Total Bilirubin: 0.8 mg/dL (ref 0.2–1.2)
Total Protein: 6.2 g/dL (ref 6.0–8.3)

## 2023-09-25 LAB — LIPID PANEL
Cholesterol: 169 mg/dL (ref 0–200)
HDL: 81.3 mg/dL (ref >39.00)
LDL Cholesterol: 72 mg/dL (ref 0–99)
NonHDL: 87.21
Total CHOL/HDL Ratio: 2
Triglycerides: 76 mg/dL (ref 0.0–149.0)
VLDL: 15.2 mg/dL (ref 0.0–40.0)

## 2023-09-25 LAB — PSA: PSA: 0 ng/mL — ABNORMAL LOW (ref 0.10–4.00)

## 2023-09-25 LAB — MICROALBUMIN / CREATININE URINE RATIO
Creatinine,U: 108.3 mg/dL
Microalb Creat Ratio: 7.3 mg/g (ref 0.0–30.0)
Microalb, Ur: 0.8 mg/dL (ref 0.0–1.9)

## 2023-09-25 LAB — HEMOGLOBIN A1C: Hgb A1c MFr Bld: 6.6 % — ABNORMAL HIGH (ref 4.6–6.5)

## 2023-09-25 MED ORDER — FARXIGA 5 MG PO TABS: ORAL_TABLET | ORAL | 3 refills | Status: AC

## 2023-09-25 MED ORDER — DICLOFENAC SODIUM 1 % EX GEL: 4.0000 g | Freq: Four times a day (QID) | CUTANEOUS | 3 refills | Status: AC

## 2023-09-25 MED ORDER — ERYTHROMYCIN 5 MG/GM OP OINT: 1.0000 | TOPICAL_OINTMENT | Freq: Every day | OPHTHALMIC | 0 refills | Status: AC

## 2023-09-25 NOTE — Assessment & Plan Note (Signed)
 Checking lipid panel and adjust as needed statin.

## 2023-09-25 NOTE — Progress Notes (Signed)
 Subjective:   Patient ID: Cameron Cardenas, male    DOB: 08-Dec-1955, 68 y.o.   MRN: [id number]  Discussed the use of AI scribe software for clinical note transcription with the patient, who gave verbal consent to proceed.  History of Present Illness Cameron Cardenas is a 68 year old male who presents with left shoulder pain and pink eye as well as routine follow up.  He has been experiencing left shoulder pain for approximately one and a half weeks. The pain is a constant ache, present 24/7, and does not worsen with specific activities such as reaching or lifting. There are no recent injuries or overuse activities that could have contributed to the pain. The pain is localized to the shoulder and sometimes radiates down the arm to the biceps and triceps area. No previous injuries to the shoulder. He initially thought his shoulder pain might be related to heartburn and has taken Pepto-Bismol and Pepcid  without relief.  In addition to the shoulder pain, he has symptoms of pink eye that began on Friday. The eye is slightly itchy with minimal drainage. There is no significant pain or other symptoms associated with the eye condition.  No new chest pain, tightness, or pressure, and no breathing difficulties. He has already received his flu shot for the season and took a COVID vaccine on Friday without any issues. No numbness, burning, or tingling in the feet.  Review of Systems  Constitutional: Negative.   HENT: Negative.    Eyes:  Positive for redness and itching.  Respiratory:  Negative for cough, chest tightness and shortness of breath.   Cardiovascular:  Negative for chest pain, palpitations and leg swelling.  Gastrointestinal:  Negative for abdominal distention, abdominal pain, constipation, diarrhea, nausea and vomiting.  Musculoskeletal:  Positive for arthralgias and myalgias. Negative for neck pain and neck stiffness.  Skin: Negative.   Neurological: Negative.   Psychiatric/Behavioral:  Negative.      Objective:  Physical Exam Constitutional:      Appearance: He is well-developed.  HENT:     Head: Normocephalic and atraumatic.  Eyes:     General:        Left eye: Discharge present.    Comments: Redness conjunctivae bilaterally  Cardiovascular:     Rate and Rhythm: Normal rate and regular rhythm.  Pulmonary:     Effort: Pulmonary effort is normal. No respiratory distress.     Breath sounds: Normal breath sounds. No wheezing or rales.  Abdominal:     General: Bowel sounds are normal. There is no distension.     Palpations: Abdomen is soft.     Tenderness: There is no abdominal tenderness.  Musculoskeletal:        General: Tenderness present.     Cervical back: Normal range of motion.  Skin:    General: Skin is warm and dry.     Comments: Foot exam done  Neurological:     Mental Status: He is alert and oriented to person, place, and time.     Coordination: Coordination normal.     Vitals:   09/25/23 0822  BP: 122/80  Pulse: 72  Resp: 20  Temp: 97.6 F (36.4 C)  Weight: 213 lb (96.6 kg)  Height: 6' 4 (1.93 m)    Assessment and Plan Assessment & Plan Conjunctivitis   He has acute conjunctivitis with mild itching and minimal discharge, but no significant pain. Prescribed antibiotic ointment to be applied to both eyes once daily for three days, preferably  in the evening.  Left shoulder pain   He experiences persistent left shoulder pain radiating to the biceps and triceps, likely due to rotator cuff tendonitis. Prescribed Voltaren  gel for topical application to the left shoulder twice daily for one to two weeks.  General Health Maintenance   Routine blood work is due. Coordination with his endocrinologist is necessary for the upcoming appointment to avoid repeat testing. Perform routine blood work including PSA.

## 2023-09-25 NOTE — Assessment & Plan Note (Signed)
 Checking CBC for stability.

## 2023-09-25 NOTE — Assessment & Plan Note (Signed)
 Worse in left than right on exam and suspect bacterial. Rx erythromycin  ointment to use bilaterally.

## 2023-09-25 NOTE — Assessment & Plan Note (Signed)
 Checking PSA for stability.

## 2023-09-25 NOTE — Assessment & Plan Note (Signed)
 BP at goal on regimen checking CMP and adjust as needed.

## 2023-09-25 NOTE — Assessment & Plan Note (Signed)
 Checking HGA1c, UACR, lipid panel, CMP. Seeing endo in a few weeks. Foot exam done and reminded about eye exam.

## 2023-09-25 NOTE — Patient Instructions (Signed)
 We have sent in the erythromycin  ointment to use a line on the lower eyelids at night time for 3 days on each eye.  We have sent in the voltaren  gel to use on the left shoulder up to 3 times a day for 1-2 weeks to help the pain.

## 2023-09-25 NOTE — Assessment & Plan Note (Signed)
 No limitation of ROM or injury. 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 Rx voltaren  gel to use BID to TID scheduled 1-2 weeks. If no improvement will get in with sports medicine.

## 2023-09-26 ENCOUNTER — Ambulatory Visit: Payer: Self-pay | Admitting: Internal Medicine

## 2023-10-02 DIAGNOSIS — H16142 Punctate keratitis, left eye: Secondary | ICD-10-CM | POA: Diagnosis not present

## 2023-10-04 ENCOUNTER — Ambulatory Visit: Attending: Anesthesiology | Admitting: Anesthesiology

## 2023-10-04 ENCOUNTER — Encounter: Payer: Self-pay | Admitting: Anesthesiology

## 2023-10-04 VITALS — BP 139/96 | HR 63 | Temp 97.7°F | Resp 20 | Ht 76.0 in | Wt 210.0 lb

## 2023-10-04 DIAGNOSIS — M48062 Spinal stenosis, lumbar region with neurogenic claudication: Secondary | ICD-10-CM

## 2023-10-04 DIAGNOSIS — F1190 Opioid use, unspecified, uncomplicated: Secondary | ICD-10-CM

## 2023-10-04 DIAGNOSIS — M961 Postlaminectomy syndrome, not elsewhere classified: Secondary | ICD-10-CM | POA: Diagnosis not present

## 2023-10-04 DIAGNOSIS — M51362 Other intervertebral disc degeneration, lumbar region with discogenic back pain and lower extremity pain: Secondary | ICD-10-CM | POA: Diagnosis not present

## 2023-10-04 DIAGNOSIS — M5386 Other specified dorsopathies, lumbar region: Secondary | ICD-10-CM

## 2023-10-04 DIAGNOSIS — M25512 Pain in left shoulder: Secondary | ICD-10-CM | POA: Diagnosis not present

## 2023-10-04 DIAGNOSIS — M542 Cervicalgia: Secondary | ICD-10-CM

## 2023-10-04 DIAGNOSIS — G894 Chronic pain syndrome: Secondary | ICD-10-CM | POA: Diagnosis not present

## 2023-10-04 NOTE — Progress Notes (Signed)
 Safety precautions to be maintained throughout the outpatient stay will include: orient to surroundings, keep bed in low position, maintain call bell within reach at all times, provide assistance with transfer out of bed and ambulation.

## 2023-10-04 NOTE — Progress Notes (Signed)
 Subjective:  Patient ID: Cameron Cardenas, male    DOB: 1956/01/04  Age: 68 y.o. MRN: [id number]  CC: Shoulder Pain (Radiating in to front of Chest and down left arm, denies any cardiac symptoms )   Procedure: None  HPI Cameron Cardenas presents for a return visit.  He was last seen about a year ago.  He typically presents for control of low back pain that has been a chronic ongoing problem with associated lower extremity pain.  He reports that this pain has been stable in nature.  At present he is here to address some left anterior trapezius and left anterior deltoid pain that he feels may be coming from his neck.  He reports some neck stiffness especially when he is extending his neck and this has been present for approximately 2 weeks.  He was seen by his internal medicine physician who referred him for pain control.  The pain he is reporting is mainly a burning aching pain over the left anterior shoulder that seems to radiate from the left posterior neck.  Seems to be worse with certain activities and he has been unable to get any significant relief from this with application of Voltaren  gel.  In the past he has taken Aleve and this gave him some transient relief but he discontinued this without thinking much more about it.  Otherwise he has been trying to do some baseline stretching mainly for the low back.  He reports no numbness and tingling affecting the upper extremities.  Outpatient Medications Prior to Visit  Medication Sig Dispense Refill   amLODipine  (NORVASC ) 5 MG tablet Take 1 tablet (5 mg total) by mouth daily. 90 tablet 3   aspirin  81 MG tablet Take 81 mg by mouth daily.     azelastine  (ASTELIN ) 0.1 % nasal spray Place 2 sprays into both nostrils 2 (two) times daily. Use in each nostril as directed 30 mL 12   Blood Glucose Monitoring Suppl (ONETOUCH VERIO) w/Device KIT Check blood sugars 3 times daily 1 kit 0   cyclobenzaprine  (FLEXERIL ) 10 MG tablet Take 1 tablet (10 mg total) by  mouth 3 (three) times daily as needed for muscle spasms. 30 tablet 0   desloratadine  (CLARINEX ) 5 MG tablet Take 1 tablet (5 mg total) by mouth daily. 90 tablet 3   econazole nitrate 1 % cream Apply topically 2 (two) times daily.     erythromycin  ophthalmic ointment Place 1 Application into both eyes at bedtime. 3.5 g 0   ezetimibe  (ZETIA ) 10 MG tablet Take 1 tablet (10 mg total) by mouth daily. 90 tablet 3   famotidine  (PEPCID ) 20 MG tablet TAKE 1 TABLET BY MOUTH TWICE A DAY 180 tablet 1   FARXIGA  5 MG TABS tablet TAKE 1 TABLET BY MOUTH EVERY DAY IN THE MORNING 90 tablet 3   fluticasone  (FLONASE ) 50 MCG/ACT nasal spray SPRAY 2 SPRAYS INTO EACH NOSTRIL EVERY DAY 48 mL 2   glucose blood (ONETOUCH VERIO) test strip Check blood sugar 3 times daily 300 each 3   glucose blood (ONETOUCH VERIO) test strip USE TO TEST BLOOD SUGARS ONCE DAILY 100 strip 2   hydrochlorothiazide  (MICROZIDE ) 12.5 MG capsule Take 1 capsule (12.5 mg total) by mouth daily. 90 capsule 3   ibuprofen (ADVIL) 600 MG tablet Take 600 mg by mouth 2 (two) times daily as needed.     Lancets (ONETOUCH ULTRASOFT) lancets Check blood sugar 3 times daily 100 each 12   metFORMIN  (GLUCOPHAGE -XR) 500 MG  24 hr tablet Take 1 tablet (500 mg total) by mouth 2 (two) times daily. 180 tablet 3   OneTouch Delica Lancets 33G MISC Check blood sugars 3 times daily 300 each 3   sildenafil  (REVATIO ) 20 MG tablet TAKE 2-3 TABLETS BY MOUTH AS NEEDED, AS DIRECTED DISCONTINUE IF HAVING HEADACHES. 270 tablet 0   sildenafil  (VIAGRA ) 50 MG tablet Take 1 tablet (50 mg total) by mouth daily as needed for erectile dysfunction. 90 tablet 3   XIIDRA 5 % SOLN Apply 1 drop to eye 2 (two) times daily.     diclofenac  Sodium (VOLTAREN ) 1 % GEL Apply 4 g topically 4 (four) times daily. (Patient not taking: Reported on 10/04/2023) 100 g 3   GEMTESA 75 MG TABS Take 1 tablet by mouth daily. (Patient not taking: Reported on 10/04/2023)     hydrocortisone 2.5 % cream External;  Duration: 10 Days     ketoconazole (NIZORAL) 2 % cream as needed. (Patient not taking: Reported on 10/04/2023)     montelukast  (SINGULAIR ) 10 MG tablet Take 1 tablet (10 mg total) by mouth at bedtime. (Patient not taking: Reported on 10/04/2023) 90 tablet 3   neomycin-polymyxin b-dexamethasone (MAXITROL) 3.5-10000-0.1 SUSP 1 drop every 4 (four) hours. (Patient not taking: Reported on 10/04/2023)     pioglitazone  (ACTOS ) 30 MG tablet Take 1 tablet (30 mg total) by mouth daily. (Patient not taking: Reported on 10/04/2023) 90 tablet 3   rosuvastatin  (CRESTOR ) 40 MG tablet Take 1 tablet (40 mg total) by mouth daily. (Patient not taking: Reported on 10/04/2023) 90 tablet 3   No facility-administered medications prior to visit.    Review of Systems CNS: No confusion or sedation Cardiac: No angina or palpitations GI: No abdominal pain or constipation Constitutional: No nausea vomiting fevers or chills  Objective:  BP (!) 139/96 (BP Location: Right Arm, Patient Position: Sitting)   Pulse 63   Temp 97.7 F (36.5 C)   Resp 20   Ht 6' 4 (1.93 m)   Wt 210 lb (95.3 kg)   SpO2 98%   BMI 25.56 kg/m    BP Readings from Last 3 Encounters:  10/04/23 (!) 139/96  09/25/23 122/80  04/05/23 122/80     Wt Readings from Last 3 Encounters:  10/04/23 210 lb (95.3 kg)  09/25/23 213 lb (96.6 kg)  04/05/23 210 lb (95.3 kg)     Physical Exam Pt is alert and oriented PERRL EOMI HEART IS RRR no murmur or rub LCTA no wheezing or rales MUSCULOSKELETAL reveals good range of motion at the atlantooccipital joint.  He does have some mild pain and reproduction of primary symptoms with extension at the neck.  Left and right lateral rotation appears to be appropriate.  Muscle tone and bulk to the trapezius and deltoid is good strength throughout the upper and extremities is 5/5 both proximal and distal with no sensory deficits noted.  He has good capillary refill.  Labs  Lab Results  Component Value Date    HGBA1C 6.6 (H) 09/25/2023   HGBA1C 6.1 (A) 04/05/2023   HGBA1C 6.0 11/25/2022   Lab Results  Component Value Date   MICROALBUR 0.8 09/25/2023   LDLCALC 72 09/25/2023   CREATININE 1.06 09/25/2023    -------------------------------------------------------------------------------------------------------------------- Lab Results  Component Value Date   WBC 2.6 (L) 09/25/2023   HGB 14.3 09/25/2023   HCT 43.6 09/25/2023   PLT 117.0 (L) 09/25/2023   GLUCOSE 103 (H) 09/25/2023   CHOL 169 09/25/2023   TRIG 76.0 09/25/2023  HDL 81.30 09/25/2023   LDLDIRECT 68.0 03/09/2022   LDLCALC 72 09/25/2023   ALT 45 09/25/2023   AST 28 09/25/2023   NA 142 09/25/2023   K 4.0 09/25/2023   CL 105 09/25/2023   CREATININE 1.06 09/25/2023   BUN 17 09/25/2023   CO2 29 09/25/2023   TSH 1.50 03/21/2018   PSA 0.00 Repeated and verified X2. (L) 09/25/2023   INR 1.14 07/30/2009   HGBA1C 6.6 (H) 09/25/2023   MICROALBUR 0.8 09/25/2023    --------------------------------------------------------------------------------------------------------------------- MR PROSTATE WO CONTRAST Result Date: 04/25/2022 CLINICAL DATA:  Elevated PSA level of 4.66.  R97.20 EXAM: MR PROSTATE WITHOUT CONTRAST TECHNIQUE: Multiplanar multisequence MRI images were obtained of the pelvis centered about the prostate, without contrast administration. COMPARISON:  None Available. FINDINGS: Prostate: Region of interest # 1: PI-RADS category 4 lesion of the right anterior transition zone bulging into the right anterior peripheral zone, with focally reduced T2 signal (image 14, series 8) corresponding to reduced ADC map activity and restricted diffusion (image 16 of series 6 and 7). This measures 0.74 cc (1.1 by 1.0 by 1.2 cm). Hazy low T2 signal in the peripheral zone is nonfocal, likely postinflammatory, and is considered PI-RADS category 2. Encapsulated nodularity in the transition zone compatible with benign prostatic hypertrophy. Volume:  3D volumetric analysis: Prostate volume 40.07 cc (5.8 by 3.7 by 3.9 cm). Transcapsular spread:  Absent Seminal vesicle involvement: Absent Neurovascular bundle involvement: Absent Pelvic adenopathy: Absent Bone metastasis: Absent. A small T1 hypodense lesion in the left iliac bone near the SI joint on image 11 of series 4 -2 does not enhance on the postcontrast images and is likely a bone island or similar benign lesion Other findings: Artifact from lower lumbar spinal hardware. IMPRESSION: 1. PI-RADS category 4 lesion of the right anterior transition zone bulging into the right anterior peripheral zone. Targeting data sent to [facility]. 2. Benign prostatic hypertrophy. Electronically Signed   By: Ryan Salvage M.D.   On: 04/25/2022 14:16     Assessment & Plan:   Cameron Cardenas was seen today for shoulder pain.  Diagnoses and all orders for this visit:  Lumbar post-laminectomy syndrome  Degeneration of intervertebral disc of lumbar region with discogenic back pain and lower extremity pain  Sciatica of right side associated with disorder of lumbar spine  Chronic pain syndrome  Spinal stenosis of lumbar region with neurogenic claudication  Chronic, continuous use of opioids  Cervicalgia -     Ambulatory referral to Physical Therapy  Acute pain of left shoulder -     Ambulatory referral to Physical Therapy        ----------------------------------------------------------------------------------------------------------------------  Problem List Items Addressed This Visit       Unprioritized   Cervicalgia   Relevant Orders   Ambulatory referral to Physical Therapy   DDD (degenerative disc disease), lumbar   Left shoulder pain   Relevant Orders   Ambulatory referral to Physical Therapy   Lumbar post-laminectomy syndrome - Primary   Sciatica of right side associated with disorder of lumbar spine   Other Visit Diagnoses       Chronic pain syndrome         Spinal stenosis of lumbar  region with neurogenic claudication         Chronic, continuous use of opioids             ----------------------------------------------------------------------------------------------------------------------  1. Lumbar post-laminectomy syndrome (Primary) Continue core stretching strengthening exercises  2. Degeneration of intervertebral disc of lumbar region with discogenic back  pain and lower extremity pain As above  3. Sciatica of right side associated with disorder of lumbar spine   4. Chronic pain syndrome   5. Spinal stenosis of lumbar region with neurogenic claudication   6. Chronic, continuous use of opioids   7. Cervicalgia Based on current evaluation it is appropriate to refer to physical therapy for some home remedies and ongoing support.  He may be a candidate for traction and TENS unit application.  This is scheduled for 2 times a week for 3 weeks.  In the meantime he can start Aleve 2 tablets twice a day for 5 days and then reduce this to 1 tablet twice a day for the next few weeks.  Hopefully this will give him some better relief.  Ultimately he may require some x-rays of the C-spine for evaluation.  Should the symptoms change or persist I have encouraged him to follow through with this. - Ambulatory referral to Physical Therapy  8. Acute pain of left shoulder As above with return to clinic in 1 month - Ambulatory referral to Physical Therapy    ----------------------------------------------------------------------------------------------------------------------  I am having Cameron Cardenas maintain his aspirin , econazole nitrate, ketoconazole, onetouch ultrasoft, OneTouch Delica Lancets 33G, OneTouch Verio, OneTouch Verio, [employer], desloratadine , cyclobenzaprine , ibuprofen, Xiidra, neomycin-polymyxin b-dexamethasone, Gemtesa, famotidine , azelastine , montelukast , amLODipine , hydrochlorothiazide , sildenafil , metFORMIN , rosuvastatin , ezetimibe ,  pioglitazone , sildenafil , fluticasone , Farxiga , erythromycin , diclofenac  Sodium, and hydrocortisone.   No orders of the defined types were placed in this encounter.  Patient's Medications  New Prescriptions   No medications on file  Previous Medications   AMLODIPINE  (NORVASC ) 5 MG TABLET    Take 1 tablet (5 mg total) by mouth daily.   ASPIRIN  81 MG TABLET    Take 81 mg by mouth daily.   AZELASTINE  (ASTELIN ) 0.1 % NASAL SPRAY    Place 2 sprays into both nostrils 2 (two) times daily. Use in each nostril as directed   BLOOD GLUCOSE MONITORING SUPPL (ONETOUCH VERIO) W/DEVICE KIT    Check blood sugars 3 times daily   CYCLOBENZAPRINE  (FLEXERIL ) 10 MG TABLET    Take 1 tablet (10 mg total) by mouth 3 (three) times daily as needed for muscle spasms.   DESLORATADINE  (CLARINEX ) 5 MG TABLET    Take 1 tablet (5 mg total) by mouth daily.   DICLOFENAC  SODIUM (VOLTAREN ) 1 % GEL    Apply 4 g topically 4 (four) times daily.   ECONAZOLE NITRATE 1 % CREAM    Apply topically 2 (two) times daily.   ERYTHROMYCIN  OPHTHALMIC OINTMENT    Place 1 Application into both eyes at bedtime.   EZETIMIBE  (ZETIA ) 10 MG TABLET    Take 1 tablet (10 mg total) by mouth daily.   FAMOTIDINE  (PEPCID ) 20 MG TABLET    TAKE 1 TABLET BY MOUTH TWICE A DAY   FARXIGA  5 MG TABS TABLET    TAKE 1 TABLET BY MOUTH EVERY DAY IN THE MORNING   FLUTICASONE  (FLONASE ) 50 MCG/ACT NASAL SPRAY    SPRAY 2 SPRAYS INTO EACH NOSTRIL EVERY DAY   GEMTESA 75 MG TABS    Take 1 tablet by mouth daily.   GLUCOSE BLOOD (ONETOUCH VERIO) TEST STRIP    Check blood sugar 3 times daily   GLUCOSE BLOOD (ONETOUCH VERIO) TEST STRIP    USE TO TEST BLOOD SUGARS ONCE DAILY   HYDROCHLOROTHIAZIDE  (MICROZIDE ) 12.5 MG CAPSULE    Take 1 capsule (12.5 mg total) by mouth daily.   HYDROCORTISONE 2.5 % CREAM  External; Duration: 10 Days   IBUPROFEN (ADVIL) 600 MG TABLET    Take 600 mg by mouth 2 (two) times daily as needed.   KETOCONAZOLE (NIZORAL) 2 % CREAM    as needed.    LANCETS (ONETOUCH ULTRASOFT) LANCETS    Check blood sugar 3 times daily   METFORMIN  (GLUCOPHAGE -XR) 500 MG 24 HR TABLET    Take 1 tablet (500 mg total) by mouth 2 (two) times daily.   MONTELUKAST  (SINGULAIR ) 10 MG TABLET    Take 1 tablet (10 mg total) by mouth at bedtime.   NEOMYCIN-POLYMYXIN B-DEXAMETHASONE (MAXITROL) 3.5-10000-0.1 SUSP    1 drop every 4 (four) hours.   ONETOUCH DELICA LANCETS 33G MISC    Check blood sugars 3 times daily   PIOGLITAZONE  (ACTOS ) 30 MG TABLET    Take 1 tablet (30 mg total) by mouth daily.   ROSUVASTATIN  (CRESTOR ) 40 MG TABLET    Take 1 tablet (40 mg total) by mouth daily.   SILDENAFIL  (REVATIO ) 20 MG TABLET    TAKE 2-3 TABLETS BY MOUTH AS NEEDED, AS DIRECTED DISCONTINUE IF HAVING HEADACHES.   SILDENAFIL  (VIAGRA ) 50 MG TABLET    Take 1 tablet (50 mg total) by mouth daily as needed for erectile dysfunction.   XIIDRA 5 % SOLN    Apply 1 drop to eye 2 (two) times daily.  Modified Medications   No medications on file  Discontinued Medications   No medications on file   ----------------------------------------------------------------------------------------------------------------------  Follow-up: Return in about 1 month (around 11/03/2023) for evaluation, med refill.    Lynwood KANDICE Clause, MD

## 2023-10-05 ENCOUNTER — Ambulatory Visit: Payer: Self-pay | Admitting: *Deleted

## 2023-10-05 NOTE — Telephone Encounter (Signed)
 Copied from CRM [id number]. Topic: Clinical - Red Word Triage >> Oct 05, 2023  2:39 PM Aisha D wrote: Red Word that prompted transfer to Nurse Triage: Swelling   Pt stated that he is experiencing leg swelling on both legs and would like to schedule an appt with the provider. Reason for Disposition  [1] MILD swelling of both ankles (i.e., pedal edema) AND [2] new-onset or getting worse  Answer Assessment - Initial Assessment Questions 1. ONSET: When did the swelling start? (e.g., minutes, hours, days)     I'm having swelling both of my legs this morning that is bad.   Every time I put socks on they swell.   This morning it looked bad when I got up.  2. LOCATION: What part of the leg is swollen?  Are both legs swollen or just one leg?     3 inches up from my ankles. I've had swelling before but it wasn't this bad. 3. SEVERITY: How bad is the swelling? (e.g., localized; mild, moderate, severe)     See above No shortness of breath 4. REDNESS: Is there redness or signs of infection?     No redness or open wounds. 5. PAIN: Is the swelling painful to touch? If Yes, ask: How painful is it?   (Scale 1-10; mild, moderate or severe)     It's little sore. 6. FEVER: Do you have a fever? If Yes, ask: What is it, how was it measured, and when did it start?      Not asked 7. CAUSE: What do you think is causing the leg swelling?     I don't know 8. MEDICAL HISTORY: Do you have a history of blood clots (e.g., DVT), cancer, heart failure, kidney disease, or liver failure?     No blood clots  in legs No kidney or heart problems 9. RECURRENT SYMPTOM: Have you had leg swelling before? If Yes, ask: When was the last time? What happened that time?     Yes but not this bad 10. OTHER SYMPTOMS: Do you have any other symptoms? 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   11. PREGNANCY: Is there any  chance you are pregnant? When was your last menstrual period?       N/A  Protocols used: Leg Swelling and Edema-A-AH FYI Only or Action Required?: FYI only for provider.  Patient was last seen in primary care on 09/25/2023 by Rollene Almarie LABOR, MD.  Called Nurse Triage reporting Leg Swelling. Bilateral ankle swelling worse this morning 9/25  Symptoms began today. This morning the swelling is worse   No shortness of breath  Interventions attempted: Rest, hydration, or home remedies. Elevating his legs  Symptoms are: gradually worsening.  Triage Disposition: See PCP When Office is Open (Within 3 Days)Appt made with Dr. Geofm for 10/10/2023 at 8:50.     Patient/caregiver understands and will follow disposition?: Yes

## 2023-10-06 ENCOUNTER — Ambulatory Visit: Admitting: Endocrinology

## 2023-10-09 ENCOUNTER — Encounter: Payer: Self-pay | Admitting: Internal Medicine

## 2023-10-09 NOTE — Progress Notes (Unsigned)
 Subjective:    Patient ID: Cameron Cardenas, male    DOB: [birth year], 68 y.o.   MRN: [id number]      HPI Cameron Cardenas is here for No chief complaint on file.        Medications and allergies reviewed with patient and updated if appropriate.  Current Outpatient Medications on File Prior to Visit  Medication Sig Dispense Refill   amLODipine  (NORVASC ) 5 MG tablet Take 1 tablet (5 mg total) by mouth daily. 90 tablet 3   aspirin  81 MG tablet Take 81 mg by mouth daily.     azelastine  (ASTELIN ) 0.1 % nasal spray Place 2 sprays into both nostrils 2 (two) times daily. Use in each nostril as directed 30 mL 12   Blood Glucose Monitoring Suppl (ONETOUCH VERIO) w/Device KIT Check blood sugars 3 times daily 1 kit 0   cyclobenzaprine  (FLEXERIL ) 10 MG tablet Take 1 tablet (10 mg total) by mouth 3 (three) times daily as needed for muscle spasms. 30 tablet 0   desloratadine  (CLARINEX ) 5 MG tablet Take 1 tablet (5 mg total) by mouth daily. 90 tablet 3   diclofenac  Sodium (VOLTAREN ) 1 % GEL Apply 4 g topically 4 (four) times daily. (Patient not taking: Reported on 10/04/2023) 100 g 3   econazole nitrate 1 % cream Apply topically 2 (two) times daily.     erythromycin  ophthalmic ointment Place 1 Application into both eyes at bedtime. 3.5 g 0   ezetimibe  (ZETIA ) 10 MG tablet Take 1 tablet (10 mg total) by mouth daily. 90 tablet 3   famotidine  (PEPCID ) 20 MG tablet TAKE 1 TABLET BY MOUTH TWICE A DAY 180 tablet 1   FARXIGA  5 MG TABS tablet TAKE 1 TABLET BY MOUTH EVERY DAY IN THE MORNING 90 tablet 3   fluticasone  (FLONASE ) 50 MCG/ACT nasal spray SPRAY 2 SPRAYS INTO EACH NOSTRIL EVERY DAY 48 mL 2   GEMTESA 75 MG TABS Take 1 tablet by mouth daily. (Patient not taking: Reported on 10/04/2023)     glucose blood (ONETOUCH VERIO) test strip Check blood sugar 3 times daily 300 each 3   glucose blood (ONETOUCH VERIO) test strip USE TO TEST BLOOD SUGARS ONCE DAILY 100 strip 2   hydrochlorothiazide  (MICROZIDE ) 12.5 MG  capsule Take 1 capsule (12.5 mg total) by mouth daily. 90 capsule 3   hydrocortisone 2.5 % cream External; Duration: 10 Days     ibuprofen (ADVIL) 600 MG tablet Take 600 mg by mouth 2 (two) times daily as needed.     ketoconazole (NIZORAL) 2 % cream as needed. (Patient not taking: Reported on 10/04/2023)     Lancets (ONETOUCH ULTRASOFT) lancets Check blood sugar 3 times daily 100 each 12   metFORMIN  (GLUCOPHAGE -XR) 500 MG 24 hr tablet Take 1 tablet (500 mg total) by mouth 2 (two) times daily. 180 tablet 3   montelukast  (SINGULAIR ) 10 MG tablet Take 1 tablet (10 mg total) by mouth at bedtime. (Patient not taking: Reported on 10/04/2023) 90 tablet 3   neomycin-polymyxin b-dexamethasone (MAXITROL) 3.5-10000-0.1 SUSP 1 drop every 4 (four) hours. (Patient not taking: Reported on 10/04/2023)     OneTouch Delica Lancets 33G MISC Check blood sugars 3 times daily 300 each 3   pioglitazone  (ACTOS ) 30 MG tablet Take 1 tablet (30 mg total) by mouth daily. (Patient not taking: Reported on 10/04/2023) 90 tablet 3   rosuvastatin  (CRESTOR ) 40 MG tablet Take 1 tablet (40 mg total) by mouth daily. (Patient not taking: Reported on 10/04/2023) 90  tablet 3   sildenafil  (REVATIO ) 20 MG tablet TAKE 2-3 TABLETS BY MOUTH AS NEEDED, AS DIRECTED DISCONTINUE IF HAVING HEADACHES. 270 tablet 0   sildenafil  (VIAGRA ) 50 MG tablet Take 1 tablet (50 mg total) by mouth daily as needed for erectile dysfunction. 90 tablet 3   XIIDRA 5 % SOLN Apply 1 drop to eye 2 (two) times daily.     No current facility-administered medications on file prior to visit.    Review of Systems     Objective:  There were no vitals filed for this visit. BP Readings from Last 3 Encounters:  10/04/23 (!) 139/96  09/25/23 122/80  04/05/23 122/80   Wt Readings from Last 3 Encounters:  10/04/23 210 lb (95.3 kg)  09/25/23 213 lb (96.6 kg)  04/05/23 210 lb (95.3 kg)   There is no height or weight on file to calculate BMI.    Physical Exam          Assessment & Plan:    See Problem List for Assessment and Plan of chronic medical problems.

## 2023-10-10 ENCOUNTER — Ambulatory Visit (INDEPENDENT_AMBULATORY_CARE_PROVIDER_SITE_OTHER): Admitting: Internal Medicine

## 2023-10-10 VITALS — BP 110/70 | HR 65 | Temp 97.8°F | Ht 76.0 in | Wt 210.6 lb

## 2023-10-10 DIAGNOSIS — R600 Localized edema: Secondary | ICD-10-CM | POA: Diagnosis not present

## 2023-10-10 DIAGNOSIS — M5412 Radiculopathy, cervical region: Secondary | ICD-10-CM | POA: Diagnosis not present

## 2023-10-10 MED ORDER — PREDNISONE 20 MG PO TABS
40.0000 mg | ORAL_TABLET | Freq: Every day | ORAL | 0 refills | Status: AC
Start: 2023-10-10 — End: 2023-10-15

## 2023-10-10 NOTE — Patient Instructions (Addendum)
     Start wearing compression socks for your leg swelling.      Medications changes include :   prednisone 40 mg daily x 5 days.      Return if symptoms worsen or fail to improve.

## 2023-10-16 ENCOUNTER — Ambulatory Visit: Payer: Self-pay

## 2023-10-16 NOTE — Telephone Encounter (Signed)
 FYI Only or Action Required?: FYI only for provider.  Patient was last seen in primary care on 10/10/2023 by Geofm Glade PARAS, MD.  Called Nurse Triage reporting Shoulder Pain.  Symptoms began several days ago.  Interventions attempted: Prescription medications: prednisone and Rest, hydration, or home remedies.  Symptoms are: unchanged.  Triage Disposition: See PCP When Office is Open (Within 3 Days)  Patient/caregiver understands and will follow disposition?: Yes    Reason for Disposition  [1] MODERATE pain (e.g., interferes with normal activities) AND [2] present > 3 days  Answer Assessment - Initial Assessment Questions Additional info: OV on 10/09/23. Pain not improved, requesting imaging.    1. ONSET: When did the pain start?     Head yesterday, shoulder over week  2. LOCATION: Where is the pain located?     Left side head down neck and to shoulder.  3. PAIN: How bad is the pain? (Scale 1-10; or mild, moderate, severe)     Not changed on prednisone 4. WORK OR EXERCISE: Has there been any recent work or exercise that involved this part of the body?     no 5. CAUSE: What do you think is causing the shoulder pain?     unsure 6. OTHER SYMPTOMS: Do you have any other symptoms? (e.g., neck pain, swelling, rash, fever, numbness, weakness)     denies 7. PREGNANCY: Is there any chance you are pregnant? When was your last menstrual period?  Protocols used: Shoulder Pain-A-AH

## 2023-10-16 NOTE — Telephone Encounter (Signed)
 Copied from CRM [id number]. Topic: Clinical - Red Word Triage >> Oct 16, 2023  8:51 AM Amy B wrote: Red Word that prompted transfer to Nurse Triage: Pain in left shoulder radiating into head and neck Pt disconnected prior to transfer. Called pt and LM on VM to call back.

## 2023-10-17 ENCOUNTER — Ambulatory Visit: Admitting: Internal Medicine

## 2023-10-19 ENCOUNTER — Ambulatory Visit (INDEPENDENT_AMBULATORY_CARE_PROVIDER_SITE_OTHER): Admitting: Internal Medicine

## 2023-10-19 ENCOUNTER — Encounter: Payer: Self-pay | Admitting: Internal Medicine

## 2023-10-19 ENCOUNTER — Ambulatory Visit (INDEPENDENT_AMBULATORY_CARE_PROVIDER_SITE_OTHER)

## 2023-10-19 VITALS — BP 130/90 | HR 71 | Temp 98.1°F | Ht 76.0 in | Wt 209.0 lb

## 2023-10-19 DIAGNOSIS — G8929 Other chronic pain: Secondary | ICD-10-CM

## 2023-10-19 DIAGNOSIS — M542 Cervicalgia: Secondary | ICD-10-CM | POA: Diagnosis not present

## 2023-10-19 DIAGNOSIS — M25561 Pain in right knee: Secondary | ICD-10-CM

## 2023-10-19 DIAGNOSIS — M25512 Pain in left shoulder: Secondary | ICD-10-CM | POA: Diagnosis not present

## 2023-10-19 DIAGNOSIS — J301 Allergic rhinitis due to pollen: Secondary | ICD-10-CM | POA: Diagnosis not present

## 2023-10-19 DIAGNOSIS — I10 Essential (primary) hypertension: Secondary | ICD-10-CM

## 2023-10-19 MED ORDER — DESLORATADINE 5 MG PO TABS: 5.0000 mg | ORAL_TABLET | Freq: Every day | ORAL | 3 refills | Status: AC

## 2023-10-19 MED ORDER — AZELASTINE HCL 0.1 % NA SOLN: 2.0000 | Freq: Two times a day (BID) | NASAL | 12 refills | Status: AC

## 2023-10-19 MED ORDER — FLUTICASONE PROPIONATE 50 MCG/ACT NA SUSP: NASAL | 2 refills | Status: AC

## 2023-10-19 NOTE — Progress Notes (Signed)
 Subjective:    Patient ID: Cameron Cardenas, male    DOB: 1955-09-24, 68 y.o.   MRN: [id number]      HPI Cameron Cardenas is here for  Chief Complaint  Patient presents with   Knee Pain    Right knee pain   Shoulder Pain    Bilateral shoulder pain   Neck Pain   I saw him 9/30 for cervical radiculopathy presenting mostly with left shoulder pain.  I gave him prednisone 40 mg for 5 days.  He is following with pain management and [facility].   Discussed the use of AI scribe software for clinical note transcription with the patient, who gave verbal consent to proceed.  History of Present Illness Cameron Cardenas is a 68 year old male who presents with persistent neck and left shoulder pain.  He experiences persistent neck and left shoulder pain, initially treated topical ointments, NSAIDs, Flexeril  which were not effective and most recently prednisone, which provided temporary relief. The pain originates from the back of his neck, radiating down the left side of his head and into his neck, and on one occasion went toward the area near his shoulder blade. He experiences daily shoulder pain, which sometimes affects his upper back. He reports occasional numbness in the left forearm but denies weakness or significant range of motion issues in his arm or neck. His neck is occasionally stiff and sore.  He also experiences right knee pain, which has been ongoing for some time. The knee feels like it might give out, particularly when bending, and this is associated with some pain. No swelling or pain while walking, but the knee pain can affect his sleep.  Additionally, he mentions a persistent runny nose, previously managed with Clarinex  and Flonase , which were effective in the past.  He would like refills.     Medications and allergies reviewed with patient and updated if appropriate.  Current Outpatient Medications on File Prior to Visit  Medication Sig Dispense Refill   amLODipine  (NORVASC ) 5 MG  tablet Take 1 tablet (5 mg total) by mouth daily. 90 tablet 3   aspirin  81 MG tablet Take 81 mg by mouth daily.     Blood Glucose Monitoring Suppl (ONETOUCH VERIO) w/Device KIT Check blood sugars 3 times daily 1 kit 0   cyclobenzaprine  (FLEXERIL ) 10 MG tablet Take 1 tablet (10 mg total) by mouth 3 (three) times daily as needed for muscle spasms. 30 tablet 0   desloratadine  (CLARINEX ) 5 MG tablet Take 1 tablet (5 mg total) by mouth daily. 90 tablet 3   diclofenac  Sodium (VOLTAREN ) 1 % GEL Apply 4 g topically 4 (four) times daily. 100 g 3   econazole nitrate 1 % cream Apply topically 2 (two) times daily.     erythromycin  ophthalmic ointment Place 1 Application into both eyes at bedtime. 3.5 g 0   ezetimibe  (ZETIA ) 10 MG tablet Take 1 tablet (10 mg total) by mouth daily. 90 tablet 3   famotidine  (PEPCID ) 20 MG tablet TAKE 1 TABLET BY MOUTH TWICE A DAY 180 tablet 1   FARXIGA  5 MG TABS tablet TAKE 1 TABLET BY MOUTH EVERY DAY IN THE MORNING 90 tablet 3   fluticasone  (FLONASE ) 50 MCG/ACT nasal spray SPRAY 2 SPRAYS INTO EACH NOSTRIL EVERY DAY 48 mL 2   glucose blood (ONETOUCH VERIO) test strip Check blood sugar 3 times daily 300 each 3   glucose blood (ONETOUCH VERIO) test strip USE TO TEST BLOOD SUGARS ONCE DAILY 100  strip 2   hydrochlorothiazide  (MICROZIDE ) 12.5 MG capsule Take 1 capsule (12.5 mg total) by mouth daily. 90 capsule 3   hydrocortisone 2.5 % cream External; Duration: 10 Days     ibuprofen (ADVIL) 600 MG tablet Take 600 mg by mouth 2 (two) times daily as needed.     Lancets (ONETOUCH ULTRASOFT) lancets Check blood sugar 3 times daily 100 each 12   metFORMIN  (GLUCOPHAGE -XR) 500 MG 24 hr tablet Take 1 tablet (500 mg total) by mouth 2 (two) times daily. 180 tablet 3   montelukast  (SINGULAIR ) 10 MG tablet Take 1 tablet (10 mg total) by mouth at bedtime. 90 tablet 3   OneTouch Delica Lancets 33G MISC Check blood sugars 3 times daily 300 each 3   pioglitazone  (ACTOS ) 30 MG tablet Take 1 tablet  (30 mg total) by mouth daily. 90 tablet 3   prednisoLONE acetate (PRED FORTE) 1 % ophthalmic suspension INSTILL 1 DROP INTO AFFECTED EYE(S) 4 TIMES A DAY FOR 7 DAYS     rosuvastatin  (CRESTOR ) 40 MG tablet Take 1 tablet (40 mg total) by mouth daily. 90 tablet 3   sildenafil  (REVATIO ) 20 MG tablet TAKE 2-3 TABLETS BY MOUTH AS NEEDED, AS DIRECTED DISCONTINUE IF HAVING HEADACHES. 270 tablet 0   sildenafil  (VIAGRA ) 50 MG tablet Take 1 tablet (50 mg total) by mouth daily as needed for erectile dysfunction. 90 tablet 3   XIIDRA 5 % SOLN Apply 1 drop to eye 2 (two) times daily.     GEMTESA 75 MG TABS Take 1 tablet by mouth daily. (Patient not taking: Reported on 10/19/2023)     ketoconazole (NIZORAL) 2 % cream as needed. (Patient not taking: Reported on 10/19/2023)     neomycin-polymyxin b-dexamethasone (MAXITROL) 3.5-10000-0.1 SUSP 1 drop every 4 (four) hours. (Patient not taking: Reported on 10/19/2023)     No current facility-administered medications on file prior to visit.    Review of Systems  HENT:  Positive for rhinorrhea.   Eyes:  Positive for itching (Watery eyes).  Cardiovascular:  Negative for leg swelling (Controlled with compression socks).  Musculoskeletal:  Positive for arthralgias, neck pain and neck stiffness.  Neurological:  Positive for numbness (a little left arm). Negative for weakness and headaches.       Objective:   Vitals:   10/19/23 0753  BP: (!) 130/90  Pulse: 71  Temp: 98.1 F (36.7 C)  SpO2: 95%   BP Readings from Last 3 Encounters:  10/19/23 (!) 130/90  10/10/23 110/70  10/04/23 (!) 139/96   Wt Readings from Last 3 Encounters:  10/19/23 209 lb (94.8 kg)  10/10/23 210 lb 9.6 oz (95.5 kg)  10/04/23 210 lb (95.3 kg)   Body mass index is 25.44 kg/m.    Physical Exam Constitutional:      General: He is not in acute distress.    Appearance: Normal appearance. He is not ill-appearing.  HENT:     Head: Normocephalic and atraumatic.     Nose: Rhinorrhea  present.  Eyes:     Comments: Slightly erythematous conjunctiva-appears to be allergy related  Musculoskeletal:        General: Tenderness (With palpation of left shoulder) present. No swelling or deformity. Normal range of motion.     Right lower leg: No edema.     Left lower leg: No edema.     Comments: No posterior neck muscle pain with palpation, no cervical spine pain with palpation, no trapezius pain, no left upper arm pain  Skin:  General: Skin is warm and dry.     Findings: No erythema or rash.  Neurological:     Mental Status: He is alert.     Sensory: No sensory deficit.     Motor: No weakness.            Assessment & Plan:    See Problem List for Assessment and Plan of chronic medical problems.    Assessment and Plan Assessment & Plan Cervical radiculopathy with left shoulder pain Chronic neck pain radiating to left shoulder and forearm, with mild numbness and tingling, but no weakness. Previous treatments ineffective. Possible cervical nerve compression. - Refer to sports medicine for further evaluation and management. - Left shoulder and cervical spine x-rays to assess for structural abnormalities. - Instruct him to schedule an appointment with sports medicine after obtaining x-rays.  Right knee pain Chronic right knee pain with instability during bending. No significant pain during walking or swelling. Pain affects sleep. - Refer to sports medicine for evaluation of right knee pain. -X-ray of knee today  Allergic rhinitis Chronic nasal congestion and rhinorrhea persistent despite previous management. - Prescribe Flonase  nasal spray. - Prescribe Clarinex  (desloratadine ) tablets.  Hypertension Chronic - Blood pressure slightly elevated here today, but was much better when he was here couple weeks ago No change in medications today-continue amlodipine  5 mg daily, hydrochlorothiazide  12.5 mg daily

## 2023-10-19 NOTE — Patient Instructions (Signed)
   Make an appointment with sports medicine - neck pain, L shoulder pain, R knee gives out   Xrays were ordered.       Medications changes include :   None    A referral was ordered sports medicine.

## 2023-10-26 ENCOUNTER — Ambulatory Visit: Payer: Self-pay | Admitting: Internal Medicine

## 2023-10-30 DIAGNOSIS — G549 Nerve root and plexus disorder, unspecified: Secondary | ICD-10-CM | POA: Insufficient documentation

## 2023-10-30 DIAGNOSIS — M5412 Radiculopathy, cervical region: Secondary | ICD-10-CM | POA: Diagnosis not present

## 2023-10-30 DIAGNOSIS — M25512 Pain in left shoulder: Secondary | ICD-10-CM | POA: Diagnosis not present

## 2023-10-31 ENCOUNTER — Ambulatory Visit (HOSPITAL_BASED_OUTPATIENT_CLINIC_OR_DEPARTMENT_OTHER): Admitting: Anesthesiology

## 2023-10-31 ENCOUNTER — Encounter: Payer: Self-pay | Admitting: Anesthesiology

## 2023-10-31 DIAGNOSIS — M542 Cervicalgia: Secondary | ICD-10-CM

## 2023-10-31 DIAGNOSIS — M48062 Spinal stenosis, lumbar region with neurogenic claudication: Secondary | ICD-10-CM

## 2023-10-31 DIAGNOSIS — M51362 Other intervertebral disc degeneration, lumbar region with discogenic back pain and lower extremity pain: Secondary | ICD-10-CM

## 2023-10-31 DIAGNOSIS — M961 Postlaminectomy syndrome, not elsewhere classified: Secondary | ICD-10-CM

## 2023-10-31 DIAGNOSIS — M5386 Other specified dorsopathies, lumbar region: Secondary | ICD-10-CM

## 2023-10-31 DIAGNOSIS — G894 Chronic pain syndrome: Secondary | ICD-10-CM

## 2023-10-31 DIAGNOSIS — F1190 Opioid use, unspecified, uncomplicated: Secondary | ICD-10-CM

## 2023-10-31 NOTE — Progress Notes (Signed)
 Patient changed to non virtual and in person in 2 weeks

## 2023-11-14 ENCOUNTER — Ambulatory Visit: Payer: Self-pay

## 2023-11-14 NOTE — Telephone Encounter (Signed)
 FYI Only or Action Required?: FYI only for provider: appointment scheduled on 11/6.  Patient was last seen in primary care on 10/19/2023 by Geofm Glade PARAS, MD.  Called Nurse Triage reporting No chief complaint on file..  Symptoms began a week ago.  Symptoms are: gradually worsening.  Triage Disposition: See PCP When Office is Open (Within 3 Days)  Patient/caregiver understands and will follow disposition?: Yes    Copied from CRM #[id number]. Topic: Clinical - Red Word Triage >> Nov 14, 2023  2:27 PM China J wrote: [facility] that prompted transfer to Nurse Triage: Patient is having left side pain.     Reason for Disposition  [1] MILD pain (i.e., scale 1-3; does not interfere with normal activities) AND [2] present > 3 days  Answer Assessment - Initial Assessment Questions 1. LOCATION: Where does it hurt? (e.g., left, right)     Left sided 2. ONSET: When did the pain start?     1 week ago  3. SEVERITY: How bad is the pain? (e.g., Scale 1-10; mild, moderate, or severe)     Mild to moderate  4. PATTERN: Does the pain come and go, or is it constant?      Constant  5. CAUSE: What do you think is causing the pain?     Unsure  6. OTHER SYMPTOMS:  Do you have any other symptoms? (e.g., fever, abdomen pain, vomiting, leg weakness, burning with urination, blood in urine)     No  Protocols used: [facility]-A-AH

## 2023-11-16 ENCOUNTER — Ambulatory Visit (INDEPENDENT_AMBULATORY_CARE_PROVIDER_SITE_OTHER)

## 2023-11-16 ENCOUNTER — Ambulatory Visit (INDEPENDENT_AMBULATORY_CARE_PROVIDER_SITE_OTHER): Admitting: Internal Medicine

## 2023-11-16 ENCOUNTER — Encounter: Payer: Self-pay | Admitting: Internal Medicine

## 2023-11-16 VITALS — BP 142/80 | HR 70 | Temp 97.9°F | Ht 76.0 in | Wt 209.0 lb

## 2023-11-16 DIAGNOSIS — M545 Low back pain, unspecified: Secondary | ICD-10-CM

## 2023-11-16 DIAGNOSIS — M542 Cervicalgia: Secondary | ICD-10-CM | POA: Diagnosis not present

## 2023-11-16 DIAGNOSIS — R21 Rash and other nonspecific skin eruption: Secondary | ICD-10-CM | POA: Diagnosis not present

## 2023-11-16 DIAGNOSIS — I10 Essential (primary) hypertension: Secondary | ICD-10-CM | POA: Diagnosis not present

## 2023-11-16 MED ORDER — CYCLOBENZAPRINE HCL 10 MG PO TABS: 10.0000 mg | ORAL_TABLET | Freq: Three times a day (TID) | ORAL | 0 refills | Status: AC | PRN

## 2023-11-16 MED ORDER — KETOCONAZOLE 2 % EX CREA: 1.0000 | TOPICAL_CREAM | Freq: Every day | CUTANEOUS | 1 refills | Status: AC

## 2023-11-16 MED ORDER — TRAMADOL HCL 50 MG PO TABS: 50.0000 mg | ORAL_TABLET | Freq: Four times a day (QID) | ORAL | 0 refills | Status: AC | PRN

## 2023-11-16 NOTE — Patient Instructions (Signed)
 Please take all new medication as prescribed - the muscle relaxer, and tramadol for a few days for pain  Please continue all other medications as before, and refills have been done for the keto 2% cream  Please have the pharmacy call with any other refills you may need.  Please keep your appointments with your specialists as you may have planned  Please go to the XRAY Department in the first floor for the x-ray testing  You will be contacted by phone if any changes need to be made immediately.  Otherwise, you will receive a letter about your results with an explanation, but please check with MyChart first.  You are given the work note

## 2023-11-16 NOTE — Progress Notes (Signed)
 Patient ID: Cameron Cardenas, male   DOB: [birth year], 68 y.o.   MRN: [id number]        Chief Complaint: follow up acute left lower back pain x 2 wks, right neck pain x 1 days, and genital rash, htn       HPI:  Cameron Cardenas is a 68 y.o. male here with c/o acute onset left lower back pain mild to mod to occas severe x 2 wks, intermittent, worse to stand up or bend, and no worsening GI or GU symptoms or LLE pain or weakness.  Also incidentally slip and fell in BR shower yesterday and bumped the right occiput area to hard object, now with some swelling to the area, but no LOC, nausea or cognitive change  Has right neck pain .  Also incidentally with rash to groin and scrotum previously tx with antifungal cream but now returned.  Pt denies chest pain, increased sob or doe, wheezing, orthopnea, PND, increased LE swelling, palpitations, dizziness or syncope.   Pt denies polydipsia, polyuria, or new focal neuro s/s.          Wt Readings from Last 3 Encounters:  11/16/23 209 lb (94.8 kg)  10/19/23 209 lb (94.8 kg)  10/10/23 210 lb 9.6 oz (95.5 kg)   BP Readings from Last 3 Encounters:  11/16/23 (!) 142/80  10/19/23 (!) 130/90  10/10/23 110/70         Past Medical History:  Diagnosis Date   Allergic rhinitis due to pollen    Anal fissure    Bilateral sciatica 07/09/2018   Chronic pain syndrome 07/09/2018   Chronic, continuous use of opioids 07/09/2018   DDD (degenerative disc disease), lumbar    Essential hypertension, malignant    Hemorrhoids    Lumbago    Lumbar post-laminectomy syndrome 07/09/2018   Plantar fasciitis of left foot    Prostate cancer (HCC)    Pure hypercholesterolemia    Spinal stenosis of lumbar region 07/09/2018   Type II or unspecified type diabetes mellitus without mention of complication, not stated as uncontrolled    dx in 2005   Unspecified vitamin D deficiency    Past Surgical History:  Procedure Laterality Date   CATARACT EXTRACTION  2010   COLONOSCOPY      in [facility], MD no longer in practice, does not recall the name of facility. Does belive polyps were removed   LUMBAR DISC SURGERY  2011   total of 3 sx on back per pt   MAXIMUM ACCESS (MAS)POSTERIOR LUMBAR INTERBODY FUSION (PLIF) 1 LEVEL N/A 06/10/2014   Procedure: Redo Lumbar Five-Sacral One Decompression with maximum access posterior lumbar interbody fusion;  Surgeon: Fairy Levels, MD;  Location: [facility];  Service: Neurosurgery;  Laterality: N/A;  Redo Decompression with maximum access posterior lumbar interbody fusion, L5-S1   PROSTATECTOMY      reports that he has never smoked. He has never used smokeless tobacco. He reports that he does not currently use alcohol. He reports that he does not currently use drugs after having used the following drugs: Marijuana. family history includes Cataracts in his sister; Diabetes in his mother and sister; Hypertension in his father and mother; Kidney disease in his mother; Seizures in his brother. Allergies  Allergen Reactions   Losartan Potassium-Hctz Other (See Comments)    Headache   Current Outpatient Medications on File Prior to Visit  Medication Sig Dispense Refill   amLODipine  (NORVASC ) 5 MG tablet Take 1 tablet (5 mg total) by  mouth daily. 90 tablet 3   aspirin  81 MG tablet Take 81 mg by mouth daily.     azelastine  (ASTELIN ) 0.1 % nasal spray Place 2 sprays into both nostrils 2 (two) times daily. Use in each nostril as directed 30 mL 12   Blood Glucose Monitoring Suppl (ONETOUCH VERIO) w/Device KIT Check blood sugars 3 times daily 1 kit 0   cycloSPORINE (RESTASIS) 0.05 % ophthalmic emulsion INSTILL 1 DROP INTO BOTH EYES TWICE A DAY AS DIRECTED     desloratadine  (CLARINEX ) 5 MG tablet Take 1 tablet (5 mg total) by mouth daily. 90 tablet 3   diclofenac  Sodium (VOLTAREN ) 1 % GEL Apply 4 g topically 4 (four) times daily. 100 g 3   econazole nitrate 1 % cream Apply topically 2 (two) times daily.     erythromycin  ophthalmic ointment Place 1  Application into both eyes at bedtime. 3.5 g 0   ezetimibe  (ZETIA ) 10 MG tablet Take 1 tablet (10 mg total) by mouth daily. 90 tablet 3   famotidine  (PEPCID ) 20 MG tablet TAKE 1 TABLET BY MOUTH TWICE A DAY 180 tablet 1   FARXIGA  5 MG TABS tablet TAKE 1 TABLET BY MOUTH EVERY DAY IN THE MORNING 90 tablet 3   fluticasone  (FLONASE ) 50 MCG/ACT nasal spray SPRAY 2 SPRAYS INTO EACH NOSTRIL EVERY DAY 48 mL 2   glucose blood (ONETOUCH VERIO) test strip Check blood sugar 3 times daily 300 each 3   glucose blood (ONETOUCH VERIO) test strip USE TO TEST BLOOD SUGARS ONCE DAILY 100 strip 2   hydrochlorothiazide  (MICROZIDE ) 12.5 MG capsule Take 1 capsule (12.5 mg total) by mouth daily. 90 capsule 3   hydrocortisone 2.5 % cream External; Duration: 10 Days     ibuprofen (ADVIL) 600 MG tablet Take 600 mg by mouth 2 (two) times daily as needed.     Lancets (ONETOUCH ULTRASOFT) lancets Check blood sugar 3 times daily 100 each 12   metFORMIN  (GLUCOPHAGE -XR) 500 MG 24 hr tablet Take 1 tablet (500 mg total) by mouth 2 (two) times daily. 180 tablet 3   montelukast  (SINGULAIR ) 10 MG tablet Take 1 tablet (10 mg total) by mouth at bedtime. 90 tablet 3   OneTouch Delica Lancets 33G MISC Check blood sugars 3 times daily 300 each 3   pioglitazone  (ACTOS ) 30 MG tablet Take 1 tablet (30 mg total) by mouth daily. 90 tablet 3   prednisoLONE acetate (PRED FORTE) 1 % ophthalmic suspension INSTILL 1 DROP INTO AFFECTED EYE(S) 4 TIMES A DAY FOR 7 DAYS     predniSONE (STERAPRED UNI-PAK 48 TAB) 10 MG (48) TBPK tablet Take by mouth as directed.     rosuvastatin  (CRESTOR ) 40 MG tablet Take 1 tablet (40 mg total) by mouth daily. 90 tablet 3   sildenafil  (REVATIO ) 20 MG tablet TAKE 2-3 TABLETS BY MOUTH AS NEEDED, AS DIRECTED DISCONTINUE IF HAVING HEADACHES. 270 tablet 0   sildenafil  (VIAGRA ) 50 MG tablet Take 1 tablet (50 mg total) by mouth daily as needed for erectile dysfunction. 90 tablet 3   XIIDRA 5 % SOLN Apply 1 drop to eye 2 (two)  times daily.     No current facility-administered medications on file prior to visit.        ROS:  All others reviewed and negative.  Objective        PE:  BP (!) 142/80 (BP Location: Right Arm, Patient Position: Sitting, Cuff Size: Normal)   Pulse 70   Temp 97.9 F (36.6 C) (  Oral)   Ht 6' 4 (1.93 m)   Wt 209 lb (94.8 kg)   SpO2 99%   BMI 25.44 kg/m                 Constitutional: Pt appears in NAD               HENT: Head: NCAT.                Right Ear: External ear normal.                 Left Ear: External ear normal.                Eyes: . Pupils are equal, round, and reactive to light. Conjunctivae and EOM are normal               Nose: without d/c or deformity               Neck: Neck supple. Gross normal ROM               Cardiovascular: Normal rate and regular rhythm.                 Pulmonary/Chest: Effort normal and breath sounds without rales or wheezing.                Abd:  Soft, NT, ND, + BS, no organomegaly               Neurological: Pt is alert. At baseline orientation, motor grossly intact; spine nontender in midline but has left lumbar paraspinal tender spasm                Skin: Skin is warm. No rashes, LE edema - none, mild tender right occiput < 1/2 cm localized swelling but no laceration or bleeding ; also with mild tender right posterolateral muscular tender without skin change, swelling or rash;                Psychiatric: Pt behavior is normal without agitation   Micro: none  Cardiac tracings I have personally interpreted today:  none  Pertinent Radiological findings (summarize): none   Lab Results  Component Value Date   WBC 2.6 (L) 09/25/2023   HGB 14.3 09/25/2023   HCT 43.6 09/25/2023   PLT 117.0 (L) 09/25/2023   GLUCOSE 103 (H) 09/25/2023   CHOL 169 09/25/2023   TRIG 76.0 09/25/2023   HDL 81.30 09/25/2023   LDLDIRECT 68.0 03/09/2022   LDLCALC 72 09/25/2023   ALT 45 09/25/2023   AST 28 09/25/2023   NA 142 09/25/2023   K 4.0  09/25/2023   CL 105 09/25/2023   CREATININE 1.06 09/25/2023   BUN 17 09/25/2023   CO2 29 09/25/2023   TSH 1.50 03/21/2018   PSA 0.00 Repeated and verified X2. (L) 09/25/2023   INR 1.14 07/30/2009   HGBA1C 6.6 (H) 09/25/2023   MICROALBUR 0.8 09/25/2023   Assessment/Plan:  Cameron Cardenas is a 68 y.o. Black or African American [2] male with  has a past medical history of Allergic rhinitis due to pollen, Anal fissure, Bilateral sciatica (07/09/2018), Chronic pain syndrome (07/09/2018), Chronic, continuous use of opioids (07/09/2018), DDD (degenerative disc disease), lumbar, Essential hypertension, malignant, Hemorrhoids, Lumbago, Lumbar post-laminectomy syndrome (07/09/2018), Plantar fasciitis of left foot, Prostate cancer (HCC), Pure hypercholesterolemia, Spinal stenosis of lumbar region (07/09/2018), Type II or unspecified type diabetes mellitus without mention of complication, not stated as uncontrolled, and Unspecified vitamin D  deficiency.  Scrotal rash Mild, for ketoconozole rash asd prn,  to f/u any worsening symptoms or concerns  Neck pain on right side C/w msk strain post fall; for flexeril  5 tid prn, tramadol 50 mg prn, for plain film today, and follow for any worsening s/s  Acute left-sided low back pain without sciatica No inciting event it seems ongoing x 2 wks, exam c/w msk strain, for flexeril  5 tid prn, tramadol prn, check plain film and  to f/u any worsening symptoms or concerns  Essential hypertension BP Readings from Last 3 Encounters:  11/16/23 (!) 142/80  10/19/23 (!) 130/90  10/10/23 110/70   Mild uncontrolled, likely reactive, pt to continue medical treatment norvasc  5 every day, hct 12.5 every day   Followup: Return if symptoms worsen or fail to improve.  Lynwood Rush, MD 11/19/2023 11:32 AM [facility]

## 2023-11-19 ENCOUNTER — Encounter: Payer: Self-pay | Admitting: Internal Medicine

## 2023-11-19 DIAGNOSIS — M545 Low back pain, unspecified: Secondary | ICD-10-CM | POA: Insufficient documentation

## 2023-11-19 DIAGNOSIS — R21 Rash and other nonspecific skin eruption: Secondary | ICD-10-CM | POA: Insufficient documentation

## 2023-11-19 DIAGNOSIS — M542 Cervicalgia: Secondary | ICD-10-CM | POA: Insufficient documentation

## 2023-11-19 NOTE — Assessment & Plan Note (Signed)
 Mild, for ketoconozole rash asd prn,  to f/u any worsening symptoms or concerns

## 2023-11-19 NOTE — Assessment & Plan Note (Signed)
 No inciting event it seems ongoing x 2 wks, exam c/w msk strain, for flexeril  5 tid prn, tramadol prn, check plain film and  to f/u any worsening symptoms or concerns

## 2023-11-19 NOTE — Assessment & Plan Note (Addendum)
 C/w msk strain post fall; for flexeril  5 tid prn, tramadol 50 mg prn, for plain film today, and follow for any worsening s/s

## 2023-11-19 NOTE — Assessment & Plan Note (Signed)
 BP Readings from Last 3 Encounters:  11/16/23 (!) 142/80  10/19/23 (!) 130/90  10/10/23 110/70   Mild uncontrolled, likely reactive, pt to continue medical treatment norvasc  5 every day, hct 12.5 every day

## 2023-11-21 ENCOUNTER — Encounter: Payer: Self-pay | Admitting: *Deleted

## 2023-11-21 NOTE — Progress Notes (Signed)
 Cameron Cardenas                                          MRN: [id number]   11/21/2023   The [facility] Quality Team Specialist reviewed this patient medical record for the purposes of chart review for care gap closure. The following were reviewed: abstraction for care gap closure-glycemic status assessment.    [facility] Quality Team

## 2023-11-22 ENCOUNTER — Ambulatory Visit: Payer: Self-pay | Admitting: Internal Medicine

## 2023-11-22 NOTE — Progress Notes (Deleted)
 Cameron Cardenas Cameron Cardenas [address] [place] [address] Phone: [phone] Subjective:    I'm seeing this patient by the request  of:  Rollene Almarie LABOR, MD  CC:   [name]  Cameron Cardenas is a 68 y.o. male coming in with complaint of ***  Onset-  Location Duration-  Character- Aggravating factors- Reliving factors-  Therapies tried-  Severity-     Past Medical History:  Diagnosis Date   Allergic rhinitis due to pollen    Anal fissure    Bilateral sciatica 07/09/2018   Chronic pain syndrome 07/09/2018   Chronic, continuous use of opioids 07/09/2018   DDD (degenerative disc disease), lumbar    Essential hypertension, malignant    Hemorrhoids    Lumbago    Lumbar post-laminectomy syndrome 07/09/2018   Plantar fasciitis of left foot    Prostate cancer (HCC)    Pure hypercholesterolemia    Spinal stenosis of lumbar region 07/09/2018   Type II or unspecified type diabetes mellitus without mention of complication, not stated as uncontrolled    dx in 2005   Unspecified vitamin D deficiency    Past Surgical History:  Procedure Laterality Date   CATARACT EXTRACTION  2010   COLONOSCOPY     in [facility], MD no longer in practice, does not recall the name of facility. Does belive polyps were removed   LUMBAR DISC SURGERY  2011   total of 3 sx on back per pt   MAXIMUM ACCESS (MAS)POSTERIOR LUMBAR INTERBODY FUSION (PLIF) 1 LEVEL N/A 06/10/2014   Procedure: Redo Lumbar Five-Sacral One Decompression with maximum access posterior lumbar interbody fusion;  Surgeon: Fairy Levels, MD;  Location: [facility];  Service: Neurosurgery;  Laterality: N/A;  Redo Decompression with maximum access posterior lumbar interbody fusion, L5-S1   PROSTATECTOMY     Social History   Socioeconomic History   Marital status: Married    Spouse name: Not on file   Number of children: 1   Years of education: 12   Highest education level: High school graduate   Occupational History   Occupation: disabled  Tobacco Use   Smoking status: Never   Smokeless tobacco: Never  Vaping Use   Vaping status: Never Used  Substance and Sexual Activity   Alcohol use: Not Currently    Comment: 6 pack beer lasts a month   Drug use: Not Currently    Types: Marijuana   Sexual activity: Not on file  Other Topics Concern   Not on file  Social History Narrative   Lives with wife in a one story home.  Has one child.     He is on disability since January 2020, stopped working in February 2019 for low back pain.  Former community education officer.     Education: high school.     Social Drivers of Corporate Investment Banker Strain: Low Risk  (10/14/2022)   Received from [facility]   Overall Financial Resource Strain (CARDIA)    Difficulty of Paying Living Expenses: Not hard at all  Food Insecurity: No Food Insecurity (10/14/2022)   Received from [facility]   Hunger Vital Sign    Within the past 12 months, you worried that your food would run out before you got the money to buy more.: Never true    Within the past 12 months, the food you bought just didn't last and you didn't have money to get more.: Never true  Transportation Needs:  No Transportation Needs (10/14/2022)   Received from [facility] - Transportation    In the past 12 months, has lack of transportation kept you from medical appointments or from getting medications?: No    Lack of Transportation (Non-Medical): No  Physical Activity: Not on file  Stress: Not on file  Social Connections: Not on file   Allergies  Allergen Reactions   Losartan Potassium-Hctz Other (See Comments)    Headache   Family History  Problem Relation Age of Onset   Hypertension Mother    Kidney disease Mother    Diabetes Mother    Hypertension Father    Diabetes Sister    Cataracts Sister    Seizures Brother        caused his death   Colon cancer Neg Hx     Esophageal cancer Neg Hx    Rectal cancer Neg Hx    Stomach cancer Neg Hx    Heart disease Neg Hx    Colon polyps Neg Hx     Current Outpatient Medications (Endocrine & Metabolic):    FARXIGA  5 MG TABS tablet, TAKE 1 TABLET BY MOUTH EVERY DAY IN THE MORNING   metFORMIN  (GLUCOPHAGE -XR) 500 MG 24 hr tablet, Take 1 tablet (500 mg total) by mouth 2 (two) times daily.   pioglitazone  (ACTOS ) 30 MG tablet, Take 1 tablet (30 mg total) by mouth daily.   predniSONE (STERAPRED UNI-PAK 48 TAB) 10 MG (48) TBPK tablet, Take by mouth as directed.  Current Outpatient Medications (Cardiovascular):    amLODipine  (NORVASC ) 5 MG tablet, Take 1 tablet (5 mg total) by mouth daily.   ezetimibe  (ZETIA ) 10 MG tablet, Take 1 tablet (10 mg total) by mouth daily.   hydrochlorothiazide  (MICROZIDE ) 12.5 MG capsule, Take 1 capsule (12.5 mg total) by mouth daily.   rosuvastatin  (CRESTOR ) 40 MG tablet, Take 1 tablet (40 mg total) by mouth daily.   sildenafil  (REVATIO ) 20 MG tablet, TAKE 2-3 TABLETS BY MOUTH AS NEEDED, AS DIRECTED DISCONTINUE IF HAVING HEADACHES.   sildenafil  (VIAGRA ) 50 MG tablet, Take 1 tablet (50 mg total) by mouth daily as needed for erectile dysfunction.  Current Outpatient Medications (Respiratory):    azelastine  (ASTELIN ) 0.1 % nasal spray, Place 2 sprays into both nostrils 2 (two) times daily. Use in each nostril as directed   desloratadine  (CLARINEX ) 5 MG tablet, Take 1 tablet (5 mg total) by mouth daily.   fluticasone  (FLONASE ) 50 MCG/ACT nasal spray, SPRAY 2 SPRAYS INTO EACH NOSTRIL EVERY DAY   montelukast  (SINGULAIR ) 10 MG tablet, Take 1 tablet (10 mg total) by mouth at bedtime.  Current Outpatient Medications (Analgesics):    aspirin  81 MG tablet, Take 81 mg by mouth daily.   ibuprofen (ADVIL) 600 MG tablet, Take 600 mg by mouth 2 (two) times daily as needed.   traMADol (ULTRAM) 50 MG tablet, Take 1 tablet (50 mg total) by mouth every 6 (six) hours as needed.   Current Outpatient  Medications (Other):    Blood Glucose Monitoring Suppl (ONETOUCH VERIO) w/Device KIT, Check blood sugars 3 times daily   cyclobenzaprine  (FLEXERIL ) 10 MG tablet, Take 1 tablet (10 mg total) by mouth 3 (three) times daily as needed for muscle spasms.   cycloSPORINE (RESTASIS) 0.05 % ophthalmic emulsion, INSTILL 1 DROP INTO BOTH EYES TWICE A DAY AS DIRECTED   diclofenac  Sodium (VOLTAREN ) 1 % GEL, Apply 4 g topically 4 (four) times daily.   econazole nitrate 1 % cream, Apply topically 2 (  two) times daily.   erythromycin  ophthalmic ointment, Place 1 Application into both eyes at bedtime.   famotidine  (PEPCID ) 20 MG tablet, TAKE 1 TABLET BY MOUTH TWICE A DAY   glucose blood (ONETOUCH VERIO) test strip, Check blood sugar 3 times daily   glucose blood (ONETOUCH VERIO) test strip, USE TO TEST BLOOD SUGARS ONCE DAILY   hydrocortisone 2.5 % cream, External; Duration: 10 Days   ketoconazole (NIZORAL) 2 % cream, Apply 1 Application topically daily.   Lancets (ONETOUCH ULTRASOFT) lancets, Check blood sugar 3 times daily   OneTouch Delica Lancets 33G MISC, Check blood sugars 3 times daily   prednisoLONE acetate (PRED FORTE) 1 % ophthalmic suspension, INSTILL 1 DROP INTO AFFECTED EYE(S) 4 TIMES A DAY FOR 7 DAYS   XIIDRA 5 % SOLN, Apply 1 drop to eye 2 (two) times daily.   Reviewed prior external information including notes and imaging from  primary care provider As well as notes that were available from care everywhere and other healthcare systems.  Past medical history, social, surgical and family history all reviewed in electronic medical record.  No pertanent information unless stated regarding to the chief complaint.   Review of Systems:  No headache, visual changes, nausea, vomiting, diarrhea, constipation, dizziness, abdominal pain, skin rash, fevers, chills, night sweats, weight loss, swollen lymph nodes, body aches, joint swelling, chest pain, shortness of breath, mood changes. POSITIVE muscle  aches  Objective  There were no vitals taken for this visit.   General: No apparent distress alert and oriented x3 mood and affect normal, dressed appropriately.  HEENT: Pupils equal, extraocular movements intact  Respiratory: Patient's speak in full sentences and does not appear short of breath  Cardiovascular: No lower extremity edema, non tender, no erythema      Impression and Recommendations:

## 2023-11-23 ENCOUNTER — Ambulatory Visit: Admitting: Family Medicine

## 2023-11-30 NOTE — Progress Notes (Signed)
 [facility] [address] [place] [address] Phone: [phone]   Assessment and Plan:     1. Neck pain 2. DDD (degenerative disc disease), cervical 3. Chronic left shoulder pain (Primary) -Chronic with exacerbation, initial sports medicine visit - Most consistent with cervical DDD leading to left-sided cervical radiculopathy based on HPI, physical exam, x-ray imaging - Reviewed C-spine x-ray with patient in clinic.  Discussed degenerative changes primarily at C6 and C7 - Start meloxicam  15 mg daily x2 weeks.  If still having pain after 2 weeks, complete 3rd-week of NSAID. May use remaining NSAID as needed once daily for pain control.  Do not to use additional over-the-counter NSAIDs (ibuprofen, naproxen, Advil, Aleve, etc.) while taking prescription NSAIDs.  May use Tylenol  [phone] mg 2 to 3 times a day for breakthrough pain. - Start HEP and physical therapy for neck  4. Chronic pain of right knee -Chronic with exacerbation, initial sports medicine visit - Most consistent with meniscal pathology based on chronic and intermittent pain, effusion, giving out, unremarkable x-ray - Start meloxicam  15 mg daily x2 weeks.  If still having pain after 2 weeks, complete 3rd-week of NSAID. May use remaining NSAID as needed once daily for pain control.  Do not to use additional over-the-counter NSAIDs (ibuprofen, naproxen, Advil, Aleve, etc.) while taking prescription NSAIDs.  May use Tylenol  [phone] mg 2 to 3 times a day for breakthrough pain. -Start HEP and physical therapy for knee  15 additional minutes spent for educating Therapeutic Home Exercise Program.  This included exercises focusing on stretching, strengthening, with focus on eccentric aspects.   Long term goals include an improvement in range of motion, strength, endurance as well as avoiding reinjury. Patient's frequency would include in 1-2 times a day, 3-5 times a week for a  duration of 6-12 weeks. Proper technique shown and discussed handout in great detail with ATC.  All questions were discussed and answered.      Pertinent previous records reviewed include C-spine x-ray, right knee x-ray, left shoulder x-ray   Follow Up: 6 weeks for reevaluation.  If no improvement or worsening of symptoms, could consider C-spine MRI versus right knee MRI versus intra-articular CSI of right knee   Subjective:   I, Cameron Cardenas, am serving as a neurosurgeon for Doctor Morene Mace  Chief Complaint:left shoulder pain   HPI:   12/04/2023 Patient is a 68 year old male with left  shoulder pain. Patient states he thinks his shoulder pain is coming from his neck. Decreased ROM. Pain for about 2 months. Pain radiates down his arm and to his hand. Grip strength intact. Numbness and tingling present. Ibu for the pain and that helps.   Right knee pain started 6 months ago. Decreased ROM. States that his knee gives out. Pain radiates to the back of the leg. Has been wearing a knee brace and that only helps for a day or 2.   Relevant Historical Information: Hypertension, DM type II, history of prostate cancer  Additional pertinent review of systems negative.   Current Outpatient Medications:    econazole nitrate 1 % cream, Apply topically 2 (two) times daily., Disp: , Rfl:    meloxicam  (MOBIC ) 15 MG tablet, Take 1 tablet daily for 2 weeks.  If still in pain after 2 weeks, take 1 tablet daily for an additional 1 week., Disp: 30 tablet, Rfl: 0   amLODipine  (NORVASC ) 5 MG tablet, Take 1 tablet (5 mg total) by  mouth daily., Disp: 90 tablet, Rfl: 3   aspirin  81 MG tablet, Take 81 mg by mouth daily., Disp: , Rfl:    azelastine  (ASTELIN ) 0.1 % nasal spray, Place 2 sprays into both nostrils 2 (two) times daily. Use in each nostril as directed, Disp: 30 mL, Rfl: 12   Blood Glucose Monitoring Suppl (ONETOUCH VERIO) w/Device KIT, Check blood sugars 3 times daily, Disp: 1 kit, Rfl: 0    cyclobenzaprine  (FLEXERIL ) 10 MG tablet, Take 1 tablet (10 mg total) by mouth 3 (three) times daily as needed for muscle spasms., Disp: 30 tablet, Rfl: 0   cycloSPORINE (RESTASIS) 0.05 % ophthalmic emulsion, INSTILL 1 DROP INTO BOTH EYES TWICE A DAY AS DIRECTED, Disp: , Rfl:    desloratadine  (CLARINEX ) 5 MG tablet, Take 1 tablet (5 mg total) by mouth daily., Disp: 90 tablet, Rfl: 3   diclofenac  Sodium (VOLTAREN ) 1 % GEL, Apply 4 g topically 4 (four) times daily., Disp: 100 g, Rfl: 3   erythromycin  ophthalmic ointment, Place 1 Application into both eyes at bedtime., Disp: 3.5 g, Rfl: 0   ezetimibe  (ZETIA ) 10 MG tablet, Take 1 tablet (10 mg total) by mouth daily., Disp: 90 tablet, Rfl: 3   famotidine  (PEPCID ) 20 MG tablet, TAKE 1 TABLET BY MOUTH TWICE A DAY, Disp: 180 tablet, Rfl: 1   FARXIGA  5 MG TABS tablet, TAKE 1 TABLET BY MOUTH EVERY DAY IN THE MORNING, Disp: 90 tablet, Rfl: 3   fluticasone  (FLONASE ) 50 MCG/ACT nasal spray, SPRAY 2 SPRAYS INTO EACH NOSTRIL EVERY DAY, Disp: 48 mL, Rfl: 2   glucose blood (ONETOUCH VERIO) test strip, Check blood sugar 3 times daily, Disp: 300 each, Rfl: 3   glucose blood (ONETOUCH VERIO) test strip, USE TO TEST BLOOD SUGARS ONCE DAILY, Disp: 100 strip, Rfl: 2   hydrochlorothiazide  (MICROZIDE ) 12.5 MG capsule, Take 1 capsule (12.5 mg total) by mouth daily., Disp: 90 capsule, Rfl: 3   hydrocortisone 2.5 % cream, External; Duration: 10 Days, Disp: , Rfl:    ibuprofen (ADVIL) 600 MG tablet, Take 600 mg by mouth 2 (two) times daily as needed., Disp: , Rfl:    ketoconazole  (NIZORAL ) 2 % cream, Apply 1 Application topically daily., Disp: 15 g, Rfl: 1   Lancets (ONETOUCH ULTRASOFT) lancets, Check blood sugar 3 times daily, Disp: 100 each, Rfl: 12   metFORMIN  (GLUCOPHAGE -XR) 500 MG 24 hr tablet, Take 1 tablet (500 mg total) by mouth 2 (two) times daily., Disp: 180 tablet, Rfl: 3   montelukast  (SINGULAIR ) 10 MG tablet, Take 1 tablet (10 mg total) by mouth at bedtime., Disp:  90 tablet, Rfl: 3   OneTouch Delica Lancets 33G MISC, Check blood sugars 3 times daily, Disp: 300 each, Rfl: 3   pioglitazone  (ACTOS ) 30 MG tablet, Take 1 tablet (30 mg total) by mouth daily., Disp: 90 tablet, Rfl: 3   prednisoLONE acetate (PRED FORTE) 1 % ophthalmic suspension, INSTILL 1 DROP INTO AFFECTED EYE(S) 4 TIMES A DAY FOR 7 DAYS, Disp: , Rfl:    predniSONE  (STERAPRED UNI-PAK 48 TAB) 10 MG (48) TBPK tablet, Take by mouth as directed., Disp: , Rfl:    rosuvastatin  (CRESTOR ) 40 MG tablet, Take 1 tablet (40 mg total) by mouth daily., Disp: 90 tablet, Rfl: 3   sildenafil  (REVATIO ) 20 MG tablet, TAKE 2-3 TABLETS BY MOUTH AS NEEDED, AS DIRECTED DISCONTINUE IF HAVING HEADACHES., Disp: 270 tablet, Rfl: 0   sildenafil  (VIAGRA ) 50 MG tablet, Take 1 tablet (50 mg total) by mouth daily as needed  for erectile dysfunction., Disp: 90 tablet, Rfl: 3   traMADol  (ULTRAM ) 50 MG tablet, Take 1 tablet (50 mg total) by mouth every 6 (six) hours as needed., Disp: 30 tablet, Rfl: 0   XIIDRA 5 % SOLN, Apply 1 drop to eye 2 (two) times daily., Disp: , Rfl:    Objective:     Vitals:   12/04/23 1519  Pulse: 77  SpO2: 97%  Weight: 209 lb (94.8 kg)  Height: 6' 4 (1.93 m)      Body mass index is 25.44 kg/m.    Physical Exam:    Neck Exam: Cervical Spine- Posture normal Skin- normal, intact  Neuro:  Strength-  Right Left   Deltoid (C5) 5/5 5/5  Bicep/Brachioradialis (C5/6) 5/5  5/5  Wrist Extension (C6) 5/5 5/5  Tricep (C7) 5/5 5/5  Wrist Flexion (C7) 5/5 5/5  Grip (C8) 5/5 5/5  Finger Abduction (T1) 5/5 5/5   Sensation: intact to light touch in upper extremities bilaterally  Spurling's:  negative bilaterally Neck ROM: Decreased left rotation, otherwise full active ROM TTP: Left trapezius, left cervical paraspinal NTTP: cervical spinous processes, right cervical paraspinal, thoracic paraspinal, right trapezius     Right knee: No swelling No deformity Neg fluid wave, joint milking ROM  Flex 110, Ext 0 TTP medial and lateral joint line NTTP over the quad tendon, medial fem condyle, lat fem condyle, patella, patella tendon, tibial tuberostiy, fibular head, posterior fossa, pes anserine bursa, gerdy's tubercle,   Neg anterior and posterior drawer Neg lachman Negative varus stress Negative valgus stress Negative McMurray for palpable pop, though reproduced pain Positive Thessaly  Gait normal   Electronically signed by:  Odis Mace D.CLEMENTEEN AMYE [facility] 3:58 PM 12/04/23

## 2023-12-04 ENCOUNTER — Ambulatory Visit (INDEPENDENT_AMBULATORY_CARE_PROVIDER_SITE_OTHER): Admitting: Sports Medicine

## 2023-12-04 VITALS — HR 77 | Ht 76.0 in | Wt 209.0 lb

## 2023-12-04 DIAGNOSIS — M25512 Pain in left shoulder: Secondary | ICD-10-CM | POA: Diagnosis not present

## 2023-12-04 DIAGNOSIS — M25561 Pain in right knee: Secondary | ICD-10-CM | POA: Diagnosis not present

## 2023-12-04 DIAGNOSIS — M5030 Other cervical disc degeneration, unspecified cervical region: Secondary | ICD-10-CM

## 2023-12-04 DIAGNOSIS — M542 Cervicalgia: Secondary | ICD-10-CM

## 2023-12-04 DIAGNOSIS — G8929 Other chronic pain: Secondary | ICD-10-CM | POA: Diagnosis not present

## 2023-12-04 MED ORDER — MELOXICAM 15 MG PO TABS: ORAL_TABLET | ORAL | 0 refills | Status: AC

## 2023-12-04 NOTE — Patient Instructions (Signed)
-   Start meloxicam  15 mg daily x2 weeks.  If still having pain after 2 weeks, complete 3rd-week of NSAID. May use remaining NSAID as needed once daily for pain control.  Do not to use additional over-the-counter NSAIDs (ibuprofen, naproxen, Advil, Aleve, etc.) while taking prescription NSAIDs.  May use Tylenol  [phone] mg 2 to 3 times a day for breakthrough pain.  Neck, knee HEP  PT referral   6 week follow up

## 2023-12-19 ENCOUNTER — Ambulatory Visit: Admitting: Physical Therapy

## 2023-12-29 ENCOUNTER — Ambulatory Visit: Admitting: Physical Therapy

## 2023-12-29 NOTE — Therapy (Incomplete)
 " OUTPATIENT PHYSICAL THERAPY EVALUATION   Patient Name: Cameron Cardenas MRN: [id number] DOB:05/14/1955, 68 y.o., male Today's Date: 12/29/2023   END OF SESSION:   Past Medical History:  Diagnosis Date   Allergic rhinitis due to pollen    Anal fissure    Bilateral sciatica 07/09/2018   Chronic pain syndrome 07/09/2018   Chronic, continuous use of opioids 07/09/2018   DDD (degenerative disc disease), lumbar    Essential hypertension, malignant    Hemorrhoids    Lumbago    Lumbar post-laminectomy syndrome 07/09/2018   Plantar fasciitis of left foot    Prostate cancer (HCC)    Pure hypercholesterolemia    Spinal stenosis of lumbar region 07/09/2018   Type II or unspecified type diabetes mellitus without mention of complication, not stated as uncontrolled    dx in 2005   Unspecified vitamin D deficiency    Past Surgical History:  Procedure Laterality Date   CATARACT EXTRACTION  2010   COLONOSCOPY     in [facility], MD no longer in practice, does not recall the name of facility. Does belive polyps were removed   LUMBAR DISC SURGERY  2011   total of 3 sx on back per pt   MAXIMUM ACCESS (MAS)POSTERIOR LUMBAR INTERBODY FUSION (PLIF) 1 LEVEL N/A 06/10/2014   Procedure: Redo Lumbar Five-Sacral One Decompression with maximum access posterior lumbar interbody fusion;  Surgeon: Fairy Levels, MD;  Location: [facility];  Service: Neurosurgery;  Laterality: N/A;  Redo Decompression with maximum access posterior lumbar interbody fusion, L5-S1   PROSTATECTOMY     Patient Active Problem List   Diagnosis Date Noted   Neck pain on right side 11/19/2023   Acute left-sided low back pain without sciatica 11/19/2023   Scrotal rash 11/19/2023   Nerve root disorder 10/30/2023   Cervicalgia 10/04/2023   Left shoulder pain 09/25/2023   Conjunctivitis 09/25/2023   DDD (degenerative disc disease), lumbar 11/29/2022   Sciatica of right side associated with disorder of lumbar spine 11/29/2022    Incontinence 10/25/2022   Prostate cancer (HCC) 09/28/2022   Primary osteoarthritis of right knee 04/03/2019   Primary osteoarthritis of left knee 04/03/2019   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   Lumbar post-laminectomy syndrome 07/09/2018   Prolapsed internal hemorrhoids, grade 2 11/01/2017   Palpitations 07/31/2016   Routine general medical examination at a health care facility 12/09/2015   Essential hypertension 09/03/2015   Allergic rhinitis due to pollen 09/03/2015   ED (erectile dysfunction) of organic origin 07/09/2015   Leukopenia 02/25/2015   Hyperlipidemia associated with type 2 diabetes mellitus (HCC) 07/02/2014   Type II diabetes mellitus with manifestations (HCC) 08/28/2012    PCP: [place] Almarie LABOR, MD   REFERRING PROVIDER: Leonce Katz, DO  REFERRING DIAG: Left shoulder pain, unspecified chronicity; Neck pain  THERAPY DIAG:  No diagnosis found.  Rationale for Evaluation and Treatment: Rehabilitation  ONSET DATE: ***   SUBJECTIVE:        SUBJECTIVE STATEMENT: ***  Hand dominance: {MISC; OT HAND DOMINANCE:[phone]}  PERTINENT HISTORY:  See PMH above  PAIN:  Are you having pain? Yes:  NPRS scale: *** Pain location: *** Pain description: *** Aggravating factors: *** Relieving factors: ***  PRECAUTIONS: None  RED FLAGS: None    WEIGHT BEARING RESTRICTIONS: No  FALLS:  Has patient fallen in last 6 months? {fallsyesno:27318}  OCCUPATION: ***  PLOF: Independent  PATIENT GOALS: ***   OBJECTIVE:  Note: Objective measures were completed at Evaluation unless otherwise noted. PATIENT SURVEYS:  {  rehab surveys:24030}  COGNITION: Overall cognitive status: Within functional limits for tasks assessed  SENSATION: {sensation:27233}  POSTURE:   ***  PALPATION: ***   CERVICAL ROM:   Active ROM A/PROM (deg) eval  Flexion   Extension   Right lateral flexion   Left lateral flexion   Right rotation   Left rotation     (Blank rows = not tested)  UPPER EXTREMITY ROM:  {AROM/PROM:27142} ROM Right eval Left eval  Shoulder flexion    Shoulder extension    Shoulder abduction    Shoulder adduction    Shoulder extension    Shoulder internal rotation    Shoulder external rotation    Elbow flexion    Elbow extension    Wrist flexion    Wrist extension    Wrist ulnar deviation    Wrist radial deviation    Wrist pronation    Wrist supination     (Blank rows = not tested)  UPPER EXTREMITY MMT:  MMT Right eval Left eval  Shoulder flexion    Shoulder extension    Shoulder abduction    Shoulder adduction    Shoulder extension    Shoulder internal rotation    Shoulder external rotation    Middle trapezius    Lower trapezius    Elbow flexion    Elbow extension    Wrist flexion    Wrist extension    Wrist ulnar deviation    Wrist radial deviation    Wrist pronation    Wrist supination    Grip strength     (Blank rows = not tested)  FUNCTIONAL TESTS:  {Functional tests:24029}   TREATMENT [facility] Adult PT Treatment:                                                DATE: 12/29/2023 ***  PATIENT EDUCATION:  Education details: Exam findings, POC, HEP Person educated: Patient Education method: Programmer, Multimedia, Demonstration, Tactile cues, Verbal cues, and Handouts Education comprehension: verbalized understanding, returned demonstration, verbal cues required, tactile cues required, and needs further education  HOME EXERCISE PROGRAM: ***   ASSESSMENT: CLINICAL IMPRESSION: Patient is a 68 y.o. male who was seen today for physical therapy evaluation and treatment for ***.   OBJECTIVE IMPAIRMENTS: {opptimpairments:25111}.   ACTIVITY LIMITATIONS: {activitylimitations:27494}  PARTICIPATION LIMITATIONS: {participationrestrictions:25113}  PERSONAL FACTORS: {Personal factors:25162} are also affecting patient's functional outcome.   REHAB POTENTIAL: Good  CLINICAL DECISION MAKING:  Stable/uncomplicated  EVALUATION COMPLEXITY: Low   GOALS: Goals reviewed with patient? Yes  SHORT TERM GOALS: Target date: 01/26/2024  Patient will be I with initial HEP in order to progress with therapy. Baseline: HEP provided at eval Goal status: INITIAL  2.  *** Baseline:  Goal status: INITIAL  LONG TERM GOALS: Target date: 02/23/2024  Patient will be I with final HEP to maintain progress from PT. Baseline: HEP provided at eval Goal status: INITIAL  2.  *** Baseline:  Goal status: INITIAL  3.  *** Baseline:  Goal status: INITIAL  4.  *** Baseline:  Goal status: INITIAL   PLAN: PT FREQUENCY: {rehab frequency:25116}  PT DURATION: 8 weeks  PLANNED INTERVENTIONS: 97164- PT Re-evaluation, 97110-Therapeutic exercises, 97530- Therapeutic activity, 97112- Neuromuscular re-education, 97535- Self Care, 02859- Manual therapy, 20560 (1-2 muscles), 20561 (3+ muscles)- Dry Needling, Patient/Family education, Joint mobilization, Joint manipulation, Spinal manipulation, Spinal mobilization, Cryotherapy, and Moist heat  PLAN FOR NEXT SESSION: Review HEP and progress PRN, ***   Elaine Daring, PT, DPT, LAT, ATC 12/29/2023  7:52 AM Phone: [phone] Fax: [phone]       "

## 2024-01-16 NOTE — Progress Notes (Signed)
 " [facility] [address] [place] [address] Phone: [phone] Subjective:   Cameron Cardenas, am serving as a scribe for Dr. Arthea Claudene.  I'm seeing this patient by the request  of:  Rollene Almarie LABOR, MD  CC: Bilateral shoulder pain  [name]  [name] is a 69 y.o. male coming in with complaint of L shoulder pain, did see Dr. Leonce in November.  Was diagnosed more with cervical radiculopathy and started on meloxicam  for 2 weeks.  Did have x-rays previously of the cervical spine that were independently visualized by me showing only mild arthritic changes noted mostly of the lower aspect at C5-C7.  When comparing to previous x-rays taken in October relatively similar with some very mild progression of the lower C6-C7 area. Patient states that he has had pain for the past 6 months.   Also c/o pain in R knee. Pain radiates into lower leg. Leg wants to give out when squatting.     Past Medical History:  Diagnosis Date   Allergic rhinitis due to pollen    Anal fissure    Bilateral sciatica 07/09/2018   Chronic pain syndrome 07/09/2018   Chronic, continuous use of opioids 07/09/2018   DDD (degenerative disc disease), lumbar    Essential hypertension, malignant    Hemorrhoids    Lumbago    Lumbar post-laminectomy syndrome 07/09/2018   Plantar fasciitis of left foot    Prostate cancer (HCC)    Pure hypercholesterolemia    Spinal stenosis of lumbar region 07/09/2018   Type II or unspecified type diabetes mellitus without mention of complication, not stated as uncontrolled    dx in 2005   Unspecified vitamin D deficiency    Past Surgical History:  Procedure Laterality Date   CATARACT EXTRACTION  2010   COLONOSCOPY     in [facility], MD no longer in practice, does not recall the name of facility. Does belive polyps were removed   LUMBAR DISC SURGERY  2011   total of 3 sx on back per pt   MAXIMUM ACCESS (MAS)POSTERIOR LUMBAR  INTERBODY FUSION (PLIF) 1 LEVEL N/A 06/10/2014   Procedure: Redo Lumbar Five-Sacral One Decompression with maximum access posterior lumbar interbody fusion;  Surgeon: Fairy Levels, MD;  Location: [facility];  Service: Neurosurgery;  Laterality: N/A;  Redo Decompression with maximum access posterior lumbar interbody fusion, L5-S1   PROSTATECTOMY     Social History   Socioeconomic History   Marital status: Married    Spouse name: Not on file   Number of children: 1   Years of education: 12   Highest education level: High school graduate  Occupational History   Occupation: disabled  Tobacco Use   Smoking status: Never   Smokeless tobacco: Never  Vaping Use   Vaping status: Never Used  Substance and Sexual Activity   Alcohol use: Not Currently    Comment: 6 pack beer lasts a month   Drug use: Not Currently    Types: Marijuana   Sexual activity: Not on file  Other Topics Concern   Not on file  Social History Narrative   Lives with wife in a one story home.  Has one child.     He is on disability since January 2020, stopped working in February 2019 for low back pain.  Former community education officer.     Education: high school.     Social Drivers of Health   Tobacco Use: Low Risk (11/19/2023)  Patient History    Smoking Tobacco Use: Never    Smokeless Tobacco Use: Never    Passive Exposure: Not on file  Financial Resource Strain: Low Risk  (10/14/2022)   Received from [facility]   Overall Financial Resource Strain (CARDIA)    Difficulty of Paying Living Expenses: Not hard at all  Food Insecurity: No Food Insecurity (10/14/2022)   Received from [facility]   Epic    Within the past 12 months, you worried that your food would run out before you got the money to buy more.: Never true    Within the past 12 months, the food you bought just didn't last and you didn't have money to get more.: Never true  Transportation Needs: No Transportation Needs  (10/14/2022)   Received from [facility] - Transportation    In the past 12 months, has lack of transportation kept you from medical appointments or from getting medications?: No    Lack of Transportation (Non-Medical): No  Physical Activity: Not on file  Stress: Not on file  Social Connections: Not on file  Depression (PHQ2-9): Low Risk (11/16/2023)   Depression (PHQ2-9)    PHQ-2 Score: 0  Alcohol Screen: Not on file  Housing: Low Risk  (02/23/2023)   Received from [facility]   Epic    In the last 12 months, was there a time when you were not able to pay the mortgage or rent on time?: No    In the past 12 months, how many times have you moved where you were living?: 0    At any time in the past 12 months, were you homeless or living in a shelter (including now)?: No  Utilities: Not At Risk (10/12/2022)   Received from [facility] Utilities    Threatened with loss of utilities: No  Health Literacy: Not on file   Allergies[1] Family History  Problem Relation Age of Onset   Hypertension Mother    Kidney disease Mother    Diabetes Mother    Hypertension Father    Diabetes Sister    Cataracts Sister    Seizures Brother        caused his death   Colon cancer Neg Hx    Esophageal cancer Neg Hx    Rectal cancer Neg Hx    Stomach cancer Neg Hx    Heart disease Neg Hx    Colon polyps Neg Hx     Current Outpatient Medications (Endocrine & Metabolic):    FARXIGA  5 MG TABS tablet, TAKE 1 TABLET BY MOUTH EVERY DAY IN THE MORNING   metFORMIN  (GLUCOPHAGE -XR) 500 MG 24 hr tablet, Take 1 tablet (500 mg total) by mouth 2 (two) times daily.   pioglitazone  (ACTOS ) 30 MG tablet, Take 1 tablet (30 mg total) by mouth daily.   predniSONE  (STERAPRED UNI-PAK 48 TAB) 10 MG (48) TBPK tablet, Take by mouth as directed.  Current Outpatient Medications (Cardiovascular):    amLODipine  (NORVASC ) 5 MG tablet, Take 1 tablet (5 mg  total) by mouth daily.   ezetimibe  (ZETIA ) 10 MG tablet, Take 1 tablet (10 mg total) by mouth daily.   hydrochlorothiazide  (MICROZIDE ) 12.5 MG capsule, Take 1 capsule (12.5 mg total) by mouth daily.   rosuvastatin  (CRESTOR ) 40 MG tablet, Take 1 tablet (40 mg total) by mouth daily.   sildenafil  (REVATIO ) 20 MG tablet, TAKE 2-3 TABLETS BY MOUTH AS NEEDED, AS  DIRECTED DISCONTINUE IF HAVING HEADACHES.   sildenafil  (VIAGRA ) 50 MG tablet, Take 1 tablet (50 mg total) by mouth daily as needed for erectile dysfunction.  Current Outpatient Medications (Respiratory):    azelastine  (ASTELIN ) 0.1 % nasal spray, Place 2 sprays into both nostrils 2 (two) times daily. Use in each nostril as directed   desloratadine  (CLARINEX ) 5 MG tablet, Take 1 tablet (5 mg total) by mouth daily.   fluticasone  (FLONASE ) 50 MCG/ACT nasal spray, SPRAY 2 SPRAYS INTO EACH NOSTRIL EVERY DAY   montelukast  (SINGULAIR ) 10 MG tablet, Take 1 tablet (10 mg total) by mouth at bedtime.  Current Outpatient Medications (Analgesics):    aspirin  81 MG tablet, Take 81 mg by mouth daily.   ibuprofen (ADVIL) 600 MG tablet, Take 600 mg by mouth 2 (two) times daily as needed.   meloxicam  (MOBIC ) 15 MG tablet, Take 1 tablet daily for 2 weeks.  If still in pain after 2 weeks, take 1 tablet daily for an additional 1 week.   traMADol  (ULTRAM ) 50 MG tablet, Take 1 tablet (50 mg total) by mouth every 6 (six) hours as needed.  Current Outpatient Medications (Other):    Blood Glucose Monitoring Suppl (ONETOUCH VERIO) w/Device KIT, Check blood sugars 3 times daily   cyclobenzaprine  (FLEXERIL ) 10 MG tablet, Take 1 tablet (10 mg total) by mouth 3 (three) times daily as needed for muscle spasms.   cycloSPORINE (RESTASIS) 0.05 % ophthalmic emulsion, INSTILL 1 DROP INTO BOTH EYES TWICE A DAY AS DIRECTED   diclofenac  Sodium (VOLTAREN ) 1 % GEL, Apply 4 g topically 4 (four) times daily.   econazole nitrate 1 % cream, Apply topically 2 (two) times daily.    erythromycin  ophthalmic ointment, Place 1 Application into both eyes at bedtime.   famotidine  (PEPCID ) 20 MG tablet, TAKE 1 TABLET BY MOUTH TWICE A DAY   glucose blood (ONETOUCH VERIO) test strip, Check blood sugar 3 times daily   glucose blood (ONETOUCH VERIO) test strip, USE TO TEST BLOOD SUGARS ONCE DAILY   hydrocortisone 2.5 % cream, External; Duration: 10 Days   ketoconazole  (NIZORAL ) 2 % cream, Apply 1 Application topically daily.   Lancets (ONETOUCH ULTRASOFT) lancets, Check blood sugar 3 times daily   OneTouch Delica Lancets 33G MISC, Check blood sugars 3 times daily   prednisoLONE acetate (PRED FORTE) 1 % ophthalmic suspension, INSTILL 1 DROP INTO AFFECTED EYE(S) 4 TIMES A DAY FOR 7 DAYS   XIIDRA 5 % SOLN, Apply 1 drop to eye 2 (two) times daily.   Reviewed prior external information including notes and imaging from  primary care provider As well as notes that were available from care everywhere and other healthcare systems.  Past medical history, social, surgical and family history all reviewed in electronic medical record.  No pertanent information unless stated regarding to the chief complaint.   Review of Systems:  No headache, visual changes, nausea, vomiting, diarrhea, constipation, dizziness, abdominal pain, skin rash, fevers, chills, night sweats, weight loss, swollen lymph nodes,  joint swelling, chest pain, shortness of breath, mood changes. POSITIVE muscle aches, body aches  Objective  Blood pressure 132/88, pulse 67, height 6' 4 (1.93 m), weight 211 lb (95.7 kg), SpO2 97%.   General: No apparent distress alert and oriented x3 mood and affect normal, dressed appropriately.  HEENT: Pupils equal, extraocular movements intact  Respiratory: Patient's speak in full sentences and does not appear short of breath  Cardiovascular: No lower extremity edema, non tender, no erythema  Neck exam shows patient  does have arthritic changes noted.  Patient does have some limited  flexion and extension noted.  Shoulder exam shows tightness noted over the acromioclavicular joint.  Left greater than right.  Severe overall on the left.  Positive crossover noted.  Right knee exam shows lateral tracking of the patella.  Atrophy of the VMO on the right.  Positive patellar grind test noted.  Procedure: Real-time Ultrasound Guided Injection of left AC joint Device: GE Logiq Q7 Ultrasound guided injection is preferred based studies that show increased duration, increased effect, greater accuracy, decreased procedural pain, increased response rate, and decreased cost with ultrasound guided versus blind injection.  Verbal informed consent obtained.  Time-out conducted.  Noted no overlying erythema, induration, or other signs of local infection.  Skin prepped in a sterile fashion.  Local anesthesia: Topical Ethyl chloride.  With sterile technique and under real time ultrasound guidance: With a 25-gauge half inch needle injected with 0.5 cc of 0.5% Marcaine  and 0.5 cc of Kenalog  40 mg/mL Completed without difficulty  Pain immediately resolved suggesting accurate placement of the medication.  Advised to call if fevers/chills, erythema, induration, drainage, or persistent bleeding.  Images saved Impression: Technically successful ultrasound guided injection.   Impression and Recommendations:    The above documentation has been reviewed and is accurate and complete Arthea CHRISTELLA Sharps, DO        [1]  Allergies Allergen Reactions   Losartan Potassium-Hctz Other (See Comments)    Headache   "

## 2024-01-19 ENCOUNTER — Other Ambulatory Visit: Payer: Self-pay

## 2024-01-19 ENCOUNTER — Ambulatory Visit: Admitting: Family Medicine

## 2024-01-19 ENCOUNTER — Encounter: Payer: Self-pay | Admitting: Family Medicine

## 2024-01-19 VITALS — BP 132/88 | HR 67 | Ht 76.0 in | Wt 211.0 lb

## 2024-01-19 DIAGNOSIS — G8929 Other chronic pain: Secondary | ICD-10-CM

## 2024-01-19 DIAGNOSIS — D72819 Decreased white blood cell count, unspecified: Secondary | ICD-10-CM | POA: Diagnosis not present

## 2024-01-19 DIAGNOSIS — R5383 Other fatigue: Secondary | ICD-10-CM

## 2024-01-19 DIAGNOSIS — M1711 Unilateral primary osteoarthritis, right knee: Secondary | ICD-10-CM | POA: Diagnosis not present

## 2024-01-19 DIAGNOSIS — M19012 Primary osteoarthritis, left shoulder: Secondary | ICD-10-CM | POA: Diagnosis not present

## 2024-01-19 DIAGNOSIS — M25511 Pain in right shoulder: Secondary | ICD-10-CM | POA: Diagnosis not present

## 2024-01-19 DIAGNOSIS — M25512 Pain in left shoulder: Secondary | ICD-10-CM

## 2024-01-19 DIAGNOSIS — M542 Cervicalgia: Secondary | ICD-10-CM | POA: Diagnosis not present

## 2024-01-19 LAB — TSH: TSH: 2.71 u[IU]/mL (ref 0.35–5.50)

## 2024-01-19 LAB — COMPREHENSIVE METABOLIC PANEL WITH GFR
ALT: 20 U/L (ref 3–53)
AST: 19 U/L (ref 5–37)
Albumin: 4 g/dL (ref 3.5–5.2)
Alkaline Phosphatase: 42 U/L (ref 39–117)
BUN: 17 mg/dL (ref 6–23)
CO2: 30 meq/L (ref 19–32)
Calcium: 9 mg/dL (ref 8.4–10.5)
Chloride: 108 meq/L (ref 96–112)
Creatinine, Ser: 0.98 mg/dL (ref 0.40–1.50)
GFR: 78.69 mL/min
Glucose, Bld: 92 mg/dL (ref 70–99)
Potassium: 3.6 meq/L (ref 3.5–5.1)
Sodium: 142 meq/L (ref 135–145)
Total Bilirubin: 0.9 mg/dL (ref 0.2–1.2)
Total Protein: 6.3 g/dL (ref 6.0–8.3)

## 2024-01-19 LAB — CBC WITH DIFFERENTIAL/PLATELET
Basophils Absolute: 0 K/uL (ref 0.0–0.1)
Basophils Relative: 1 % (ref 0.0–3.0)
Eosinophils Absolute: 0.1 K/uL (ref 0.0–0.7)
Eosinophils Relative: 2.5 % (ref 0.0–5.0)
HCT: 45.6 % (ref 39.0–52.0)
Hemoglobin: 15 g/dL (ref 13.0–17.0)
Lymphocytes Relative: 38.3 % (ref 12.0–46.0)
Lymphs Abs: 1 K/uL (ref 0.7–4.0)
MCHC: 32.9 g/dL (ref 30.0–36.0)
MCV: 85.4 fl (ref 78.0–100.0)
Monocytes Absolute: 0.3 K/uL (ref 0.1–1.0)
Monocytes Relative: 12.5 % — ABNORMAL HIGH (ref 3.0–12.0)
Neutro Abs: 1.1 K/uL — ABNORMAL LOW (ref 1.4–7.7)
Neutrophils Relative %: 45.7 % (ref 43.0–77.0)
Platelets: 130 K/uL — ABNORMAL LOW (ref 150.0–400.0)
RBC: 5.33 Mil/uL (ref 4.22–5.81)
RDW: 15.2 % (ref 11.5–15.5)
WBC: 2.5 K/uL — ABNORMAL LOW (ref 4.0–10.5)

## 2024-01-19 LAB — FERRITIN: Ferritin: 14 ng/mL — ABNORMAL LOW (ref 22.0–322.0)

## 2024-01-19 LAB — VITAMIN B12: Vitamin B-12: 424 pg/mL (ref 211–911)

## 2024-01-19 LAB — IBC PANEL
Iron: 81 ug/dL (ref 42–165)
Saturation Ratios: 25 % (ref 20.0–50.0)
TIBC: 323.4 ug/dL (ref 250.0–450.0)
Transferrin: 231 mg/dL (ref 212.0–360.0)

## 2024-01-19 LAB — SEDIMENTATION RATE: Sed Rate: 2 mm/h (ref 0–20)

## 2024-01-19 LAB — URIC ACID: Uric Acid, Serum: 3.9 mg/dL — ABNORMAL LOW (ref 4.0–7.8)

## 2024-01-19 LAB — C-REACTIVE PROTEIN: CRP: <0.5 mg/dL — ABNORMAL LOW (ref 1.0–20.0)

## 2024-01-19 NOTE — Assessment & Plan Note (Signed)
 We discussed Tru pull lite brace, home exercises, which activities to do and which ones to avoid.  Increase activity slowly.  Worsening pain consider injection.

## 2024-01-19 NOTE — Assessment & Plan Note (Signed)
 Patient has had this as well as the platelets low, recheck to see if it is stable or if worsening.  May consider hematology could be potentially contributing to the fatigue

## 2024-01-19 NOTE — Assessment & Plan Note (Signed)
 Concern with the cervical pain with potential radicular symptoms.  Could be causing some of the left shoulder pain.  Patient's x-rays do show some degenerative disc disease.  Do feel though that if necessary advanced imaging with an MRI could be beneficial.  Follow-up with me again to see how patient responds to conservative approach.

## 2024-01-19 NOTE — Assessment & Plan Note (Signed)
 Limited ultrasound did show the rotator cuff appears to be intact.  Acromioclavicular joint injected today and had an improvement in range of motion which makes me optimistic that this is causing the primary problem of the arm.  Differential includes a cervical radiculopathy and would like to wait 2 weeks till after the injection and then see how patient is responding.  If continuing to have arm pain will need to consider the possibility of MRI.  Patient will follow-up with me again 6 to 8 weeks no matter what

## 2024-01-19 NOTE — Patient Instructions (Addendum)
 AC joint If not better in 2 weeks will do MRI C spine Labs Brace See me again in 8-10 weeks You have 14 days to return or exchange your brace Call [phone], then return the brace to our office

## 2024-01-20 LAB — LACTATE DEHYDROGENASE: LDH: 152 U/L (ref 120–250)

## 2024-01-22 ENCOUNTER — Other Ambulatory Visit: Payer: Self-pay

## 2024-01-22 ENCOUNTER — Telehealth: Payer: Self-pay | Admitting: Family Medicine

## 2024-01-22 ENCOUNTER — Ambulatory Visit: Payer: Self-pay | Admitting: Family Medicine

## 2024-01-22 DIAGNOSIS — R899 Unspecified abnormal finding in specimens from other organs, systems and tissues: Secondary | ICD-10-CM

## 2024-01-22 NOTE — Telephone Encounter (Signed)
 Patient called and asked if someone is able to go over his lab work. He stated he is not able to get into mychart.

## 2024-01-22 NOTE — Telephone Encounter (Signed)
 Spoke with patient who would like to proceed with hematology referral.

## 2024-01-23 ENCOUNTER — Other Ambulatory Visit: Payer: Self-pay

## 2024-01-23 DIAGNOSIS — E1169 Type 2 diabetes mellitus with other specified complication: Secondary | ICD-10-CM

## 2024-01-23 MED ORDER — ROSUVASTATIN CALCIUM 40 MG PO TABS: 40.0000 mg | ORAL_TABLET | Freq: Every day | ORAL | 3 refills | Status: AC

## 2024-01-24 LAB — PROTEIN ELECTROPHORESIS, SERUM
Albumin ELP: 4.1 g/dL (ref 3.8–4.8)
Alpha 1: 0.2 g/dL (ref 0.2–0.3)
Alpha 2: 0.5 g/dL (ref 0.5–0.9)
Beta 2: 0.2 g/dL (ref 0.2–0.5)
Beta Globulin: 0.4 g/dL (ref 0.4–0.6)
Gamma Globulin: 0.7 g/dL — ABNORMAL LOW (ref 0.8–1.7)
Total Protein: 6.1 g/dL (ref 6.1–8.1)

## 2024-01-24 LAB — PTH, INTACT AND CALCIUM
Calcium: 9.2 mg/dL (ref 8.6–10.3)
PTH: 62 pg/mL (ref 16–77)

## 2024-01-25 ENCOUNTER — Telehealth: Payer: Self-pay

## 2024-01-25 NOTE — Telephone Encounter (Signed)
 Pt called wanting to know when he would be scheduled for first visit after referral to us . VM forwarded to [employer] at [phone].

## 2024-02-09 ENCOUNTER — Ambulatory Visit: Admitting: Endocrinology

## 2024-02-09 ENCOUNTER — Encounter: Payer: Self-pay | Admitting: Endocrinology

## 2024-02-09 ENCOUNTER — Ambulatory Visit: Payer: Self-pay | Admitting: Endocrinology

## 2024-02-09 VITALS — BP 144/78 | HR 74 | Ht 76.0 in | Wt 215.4 lb

## 2024-02-09 DIAGNOSIS — N5239 Other post-surgical erectile dysfunction: Secondary | ICD-10-CM

## 2024-02-09 DIAGNOSIS — E118 Type 2 diabetes mellitus with unspecified complications: Secondary | ICD-10-CM

## 2024-02-09 LAB — POCT GLYCOSYLATED HEMOGLOBIN (HGB A1C): Hemoglobin A1C: 6.1 % — AB (ref 4.0–5.6)

## 2024-02-09 MED ORDER — SILDENAFIL CITRATE 50 MG PO TABS: 50.0000 mg | ORAL_TABLET | Freq: Every day | ORAL | 1 refills | Status: AC | PRN

## 2024-02-09 MED ORDER — METFORMIN HCL ER 500 MG PO TB24: 500.0000 mg | ORAL_TABLET | Freq: Two times a day (BID) | ORAL | 3 refills | Status: AC

## 2024-02-09 MED ORDER — PIOGLITAZONE HCL 30 MG PO TABS: 30.0000 mg | ORAL_TABLET | Freq: Every day | ORAL | 3 refills | Status: AC

## 2024-02-09 NOTE — Progress Notes (Signed)
 "  Outpatient Endocrinology Note Abdimalik Mayorquin, MD  02/09/24  Patient's Name: Cameron Cardenas    DOB: Dec 01, 1955    MRN: [id number]                                                    REASON OF VISIT: Follow up for type 2 diabetes mellitus  PCP: [place] Almarie LABOR, MD  HISTORY OF PRESENT ILLNESS:   Cameron Cardenas is a 69 y.o. old male with past medical history listed below, is here for follow up of type 2 diabetes mellitus.    Pertinent Diabetes History: Patient was diagnosed with type 2 diabetes mellitus in 2004.  He was initially treated with metformin  and subsequently Actoplusmet.  In 2013 he changed his diet significantly and he started exercising.  This helped him with weight loss and subsequently good sugar control and A1c has been in upper normal range.  Chronic Diabetes Complications : Retinopathy: yes. Last ophthalmology exam was done on annually, at [facility] clinic.  Nephropathy: no Peripheral neuropathy: no Coronary artery disease: no Stroke: no  Relevant comorbidities and cardiovascular risk factors: Obesity: yes Body mass index is 26.22 kg/m.  Hypertension: yes Hyperlipidemia. Yes, on statin and ezetimibe   Current / Home Diabetic regimen includes: Actos  30 mg daily. Metformin  XR 500 mg 2 times a day. Farxiga  5 mg daily.  Prior diabetic medications:  Glycemic data:   He forgot to bring glucometer in the clinic today. He uses One Touch glucometer.  Factors modifying glucose control: 1.  Diabetic diet assessment: Eating healthy.  2.  Staying active or exercising: Regular exercise.  3.  Medication compliance: compliant all of the time.  Interval history  Hemoglobin A1c 6.1% today.  Diabetes has been as reviewed and noted above.  Laboratory results completed in January 9, reviewed normal electrolytes, renal function.  He denies numbness and ting of the feet.  No vision problem.  He reports he has eye clinic follow-up in the near future.  No glucose data  to review.  No other complaints today.  REVIEW OF SYSTEMS As per history of present illness.   PAST MEDICAL HISTORY: Past Medical History:  Diagnosis Date   Allergic rhinitis due to pollen    Anal fissure    Bilateral sciatica 07/09/2018   Chronic pain syndrome 07/09/2018   Chronic, continuous use of opioids 07/09/2018   DDD (degenerative disc disease), lumbar    Essential hypertension, malignant    Hemorrhoids    Lumbago    Lumbar post-laminectomy syndrome 07/09/2018   Plantar fasciitis of left foot    Prostate cancer (HCC)    Pure hypercholesterolemia    Spinal stenosis of lumbar region 07/09/2018   Type II or unspecified type diabetes mellitus without mention of complication, not stated as uncontrolled    dx in 2005   Unspecified vitamin D deficiency     PAST SURGICAL HISTORY: Past Surgical History:  Procedure Laterality Date   CATARACT EXTRACTION  2010   COLONOSCOPY     in [facility], MD no longer in practice, does not recall the name of facility. Does belive polyps were removed   LUMBAR DISC SURGERY  2011   total of 3 sx on back per pt   MAXIMUM ACCESS (MAS)POSTERIOR LUMBAR INTERBODY FUSION (PLIF) 1 LEVEL N/A 06/10/2014   Procedure: Redo Lumbar Five-Sacral One  Decompression with maximum access posterior lumbar interbody fusion;  Surgeon: Fairy Levels, MD;  Location: [facility];  Service: Neurosurgery;  Laterality: N/A;  Redo Decompression with maximum access posterior lumbar interbody fusion, L5-S1   PROSTATECTOMY      ALLERGIES: Allergies  Allergen Reactions   Losartan Potassium-Hctz Other (See Comments)    Headache    FAMILY HISTORY:  Family History  Problem Relation Age of Onset   Hypertension Mother    Kidney disease Mother    Diabetes Mother    Hypertension Father    Diabetes Sister    Cataracts Sister    Seizures Brother        caused his death   Colon cancer Neg Hx    Esophageal cancer Neg Hx    Rectal cancer Neg Hx    Stomach cancer Neg Hx     Heart disease Neg Hx    Colon polyps Neg Hx     SOCIAL HISTORY: Social History   Socioeconomic History   Marital status: Married    Spouse name: Not on file   Number of children: 1   Years of education: 12   Highest education level: High school graduate  Occupational History   Occupation: disabled  Tobacco Use   Smoking status: Never   Smokeless tobacco: Never  Vaping Use   Vaping status: Never Used  Substance and Sexual Activity   Alcohol use: Not Currently    Comment: 6 pack beer lasts a month   Drug use: Not Currently    Types: Marijuana   Sexual activity: Not on file  Other Topics Concern   Not on file  Social History Narrative   Lives with wife in a one story home.  Has one child.     He is on disability since January 2020, stopped working in February 2019 for low back pain.  Former community education officer.     Education: high school.     Social Drivers of Health   Tobacco Use: Low Risk (02/09/2024)   Patient History    Smoking Tobacco Use: Never    Smokeless Tobacco Use: Never    Passive Exposure: Not on file  Financial Resource Strain: Low Risk  (10/14/2022)   Received from [facility]   Overall Financial Resource Strain (CARDIA)    Difficulty of Paying Living Expenses: Not hard at all  Food Insecurity: No Food Insecurity (10/14/2022)   Received from [facility]   Epic    Within the past 12 months, you worried that your food would run out before you got the money to buy more.: Never true    Within the past 12 months, the food you bought just didn't last and you didn't have money to get more.: Never true  Transportation Needs: No Transportation Needs (10/14/2022)   Received from [facility] - Transportation    In the past 12 months, has lack of transportation kept you from medical appointments or from getting medications?: No    Lack of Transportation (Non-Medical): No  Physical Activity: Not on file   Stress: Not on file  Social Connections: Not on file  Depression (EYV7-0): Low Risk (11/16/2023)   Depression (PHQ2-9)    PHQ-2 Score: 0  Alcohol Screen: Not on file  Housing: Low Risk  (02/23/2023)   Received from [facility]   Epic    In the last 12 months, was there a time when you were not able  to pay the mortgage or rent on time?: No    In the past 12 months, how many times have you moved where you were living?: 0    At any time in the past 12 months, were you homeless or living in a shelter (including now)?: No  Utilities: Not At Risk (10/12/2022)   Received from [facility] Utilities    Threatened with loss of utilities: No  Health Literacy: Not on file    MEDICATIONS:  Current Outpatient Medications  Medication Sig Dispense Refill   amLODipine  (NORVASC ) 5 MG tablet Take 1 tablet (5 mg total) by mouth daily. 90 tablet 3   aspirin  81 MG tablet Take 81 mg by mouth daily.     azelastine  (ASTELIN ) 0.1 % nasal spray Place 2 sprays into both nostrils 2 (two) times daily. Use in each nostril as directed 30 mL 12   Blood Glucose Monitoring Suppl (ONETOUCH VERIO) w/Device KIT Check blood sugars 3 times daily 1 kit 0   cyclobenzaprine  (FLEXERIL ) 10 MG tablet Take 1 tablet (10 mg total) by mouth 3 (three) times daily as needed for muscle spasms. 30 tablet 0   cycloSPORINE (RESTASIS) 0.05 % ophthalmic emulsion INSTILL 1 DROP INTO BOTH EYES TWICE A DAY AS DIRECTED     desloratadine  (CLARINEX ) 5 MG tablet Take 1 tablet (5 mg total) by mouth daily. 90 tablet 3   diclofenac  Sodium (VOLTAREN ) 1 % GEL Apply 4 g topically 4 (four) times daily. 100 g 3   econazole nitrate 1 % cream Apply topically 2 (two) times daily.     erythromycin  ophthalmic ointment Place 1 Application into both eyes at bedtime. 3.5 g 0   ezetimibe  (ZETIA ) 10 MG tablet Take 1 tablet (10 mg total) by mouth daily. 90 tablet 3   famotidine  (PEPCID ) 20 MG tablet TAKE 1 TABLET BY MOUTH  TWICE A DAY 180 tablet 1   FARXIGA  5 MG TABS tablet TAKE 1 TABLET BY MOUTH EVERY DAY IN THE MORNING 90 tablet 3   fluticasone  (FLONASE ) 50 MCG/ACT nasal spray SPRAY 2 SPRAYS INTO EACH NOSTRIL EVERY DAY 48 mL 2   glucose blood (ONETOUCH VERIO) test strip Check blood sugar 3 times daily 300 each 3   glucose blood (ONETOUCH VERIO) test strip USE TO TEST BLOOD SUGARS ONCE DAILY 100 strip 2   hydrochlorothiazide  (MICROZIDE ) 12.5 MG capsule Take 1 capsule (12.5 mg total) by mouth daily. 90 capsule 3   hydrocortisone 2.5 % cream External; Duration: 10 Days     ibuprofen (ADVIL) 600 MG tablet Take 600 mg by mouth 2 (two) times daily as needed.     ketoconazole  (NIZORAL ) 2 % cream Apply 1 Application topically daily. 15 g 1   Lancets (ONETOUCH ULTRASOFT) lancets Check blood sugar 3 times daily 100 each 12   meloxicam  (MOBIC ) 15 MG tablet Take 1 tablet daily for 2 weeks.  If still in pain after 2 weeks, take 1 tablet daily for an additional 1 week. 30 tablet 0   montelukast  (SINGULAIR ) 10 MG tablet Take 1 tablet (10 mg total) by mouth at bedtime. 90 tablet 3   OneTouch Delica Lancets 33G MISC Check blood sugars 3 times daily 300 each 3   prednisoLONE acetate (PRED FORTE) 1 % ophthalmic suspension INSTILL 1 DROP INTO AFFECTED EYE(S) 4 TIMES A DAY FOR 7 DAYS     predniSONE  (STERAPRED UNI-PAK 48 TAB) 10 MG (48) TBPK tablet Take by mouth as directed.  rosuvastatin  (CRESTOR ) 40 MG tablet Take 1 tablet (40 mg total) by mouth daily. 90 tablet 3   sildenafil  (REVATIO ) 20 MG tablet TAKE 2-3 TABLETS BY MOUTH AS NEEDED, AS DIRECTED DISCONTINUE IF HAVING HEADACHES. 270 tablet 0   traMADol  (ULTRAM ) 50 MG tablet Take 1 tablet (50 mg total) by mouth every 6 (six) hours as needed. 30 tablet 0   XIIDRA 5 % SOLN Apply 1 drop to eye 2 (two) times daily.     metFORMIN  (GLUCOPHAGE -XR) 500 MG 24 hr tablet Take 1 tablet (500 mg total) by mouth 2 (two) times daily. 180 tablet 3   pioglitazone  (ACTOS ) 30 MG tablet Take 1 tablet  (30 mg total) by mouth daily. 90 tablet 3   sildenafil  (VIAGRA ) 50 MG tablet Take 1 tablet (50 mg total) by mouth daily as needed for erectile dysfunction. 90 tablet 1   No current facility-administered medications for this visit.    PHYSICAL EXAM: Vitals:   02/09/24 1124  BP: (!) 144/78  Pulse: 74  SpO2: 99%  Weight: 215 lb 6.4 oz (97.7 kg)  Height: 6' 4 (1.93 m)    Body mass index is 26.22 kg/m.  Wt Readings from Last 3 Encounters:  02/09/24 215 lb 6.4 oz (97.7 kg)  01/19/24 211 lb (95.7 kg)  12/04/23 209 lb (94.8 kg)    General: Well developed, well nourished male in no apparent distress.  HEENT: AT/[place], no external lesions.  Eyes: Conjunctiva clear and no icterus. Neck: Neck supple  Lungs: Respirations not labored Neurologic: Alert, oriented, normal speech Extremities / Skin: Dry.  Psychiatric: Does not appear depressed or anxious  Diabetic Foot Exam - Simple   No data filed    LABS Reviewed Lab Results  Component Value Date   HGBA1C 6.1 (A) 02/09/2024   HGBA1C 6.6 (H) 09/25/2023   HGBA1C 6.1 (A) 04/05/2023   No results found for: FRUCTOSAMINE Lab Results  Component Value Date   CHOL 169 09/25/2023   HDL 81.30 09/25/2023   LDLCALC 72 09/25/2023   LDLDIRECT 68.0 03/09/2022   TRIG 76.0 09/25/2023   CHOLHDL 2 09/25/2023   Lab Results  Component Value Date   MICRALBCREAT 7.3 09/25/2023   Lab Results  Component Value Date   CREATININE 0.98 01/19/2024   Lab Results  Component Value Date   GFR 78.69 01/19/2024    ASSESSMENT / PLAN  1. Controlled type 2 diabetes mellitus with complication, without long-term current use of insulin  (HCC)   2. Post-procedural erectile dysfunction, unspecified type     Diabetes Mellitus type 2, complicated by possibly retinopathy. - Diabetic status / severity: Controlled  Lab Results  Component Value Date   HGBA1C 6.1 (A) 02/09/2024    - Hemoglobin A1c goal : <6.5%  He has controlled type 2 diabetes  mellitus.  - Medications: No change.  Medications renewed.  I) continue Actos  30 mg daily. II) continue metformin  XR 500 mg 2 times a day. III) continue Farxiga  5 mg daily.  - Home glucose testing: Daily in the morning fasting and occasionally at bedtime.  Asked to bring glucometer in the follow-up visit.  - Discussed/ Gave Hypoglycemia treatment plan.  # Annual urine for microalbuminuria/ creatinine ratio, no microalbuminuria currently. Last  Lab Results  Component Value Date   MICRALBCREAT 7.3 09/25/2023    # Foot check nightly / neuropathy.  # Patient reports he has mild diabetic retinopathy, no records available to review, and annual follow-up with ophthalmology.  He reports he has follow-up in  the near future.  - Diet: Eat reasonable portion sizes to promote a healthy weight.  - Life style / activity / exercise: Discussed.   2. Blood pressure  -  BP Readings from Last 1 Encounters:  02/09/24 (!) 144/78    - Control is not in target. Managed by PCP. - No change in current plans.  3. Lipid status / Hyperlipidemia - Last  Lab Results  Component Value Date   LDLCALC 72 09/25/2023   Continue rosuvastatin  40 mg daily.  Continue Zetia  10 mg daily.  # Erectile dysfunction - Patient is status post prostatectomy for prostate cancer.  Patient asked to refill of sildenafil .   Cameron Cardenas was seen today for diabetes.  Diagnoses and all orders for this visit:  Controlled type 2 diabetes mellitus with complication, without long-term current use of insulin  (HCC) -     POCT HgB A1C -     metFORMIN  (GLUCOPHAGE -XR) 500 MG 24 hr tablet; Take 1 tablet (500 mg total) by mouth 2 (two) times daily. -     pioglitazone  (ACTOS ) 30 MG tablet; Take 1 tablet (30 mg total) by mouth daily.  Post-procedural erectile dysfunction, unspecified type -     sildenafil  (VIAGRA ) 50 MG tablet; Take 1 tablet (50 mg total) by mouth daily as needed for erectile dysfunction.   DISPOSITION Follow up in  clinic in 6  months suggested.  Labs on the same day of the visit.   All questions answered and patient verbalized understanding of the plan.  Emmilyn Crooke, MD [facility] Endocrinology [facility] [address] [place], [address] [place], [place] [address] Phone # [phone]  At least part of this note was generated using voice recognition software. Inadvertent word errors may have occurred, which were not recognized during the proofreading process. "

## 2024-02-14 ENCOUNTER — Telehealth: Payer: Self-pay | Admitting: Endocrinology

## 2024-02-14 NOTE — Telephone Encounter (Signed)
 MEDICATION:  sildenafil  sildenafil  (VIAGRA ) 50 MG tablet  PHARMACY:    [id number] - [place], [place] - [address] (Ph: [phone])    HAS THE PATIENT CONTACTED THEIR PHARMACY?  Yes  LAST REFILL:  @@LASTREFILL @  IS THIS A 90 DAY SUPPLY : Yes  IS PATIENT OUT OF MEDICATION: Yes  IF NOT; HOW MUCH IS LEFT:   LAST APPOINTMENT DATE: @1 /30/2026  NEXT APPOINTMENT DATE:@7 /31/2026  DO WE HAVE YOUR PERMISSION TO LEAVE A DETAILED MESSAGE?:Yes  OTHER COMMENTS:    **Let patient know to contact pharmacy at the end of the day to make sure medication is ready. **  ** Please notify patient to allow 48-72 hours to process**  **Encourage patient to contact the pharmacy for refills or they can request refills through [facility]**

## 2024-03-01 ENCOUNTER — Ambulatory Visit: Admitting: Family Medicine

## 2024-03-22 ENCOUNTER — Ambulatory Visit: Admitting: Internal Medicine

## 2024-04-12 ENCOUNTER — Encounter (INDEPENDENT_AMBULATORY_CARE_PROVIDER_SITE_OTHER): Admitting: Ophthalmology

## 2024-08-09 ENCOUNTER — Ambulatory Visit: Admitting: Endocrinology
